# Patient Record
Sex: Male | Born: 1940
Health system: Southern US, Community
[De-identification: ages and names within clinical notes are randomized; demographics above are authoritative.]

## PROBLEM LIST (undated history)

## (undated) DIAGNOSIS — M1990 Unspecified osteoarthritis, unspecified site: Secondary | ICD-10-CM

## (undated) DIAGNOSIS — F32A Depression, unspecified: Secondary | ICD-10-CM

## (undated) DIAGNOSIS — R55 Syncope and collapse: Secondary | ICD-10-CM

## (undated) DIAGNOSIS — I480 Paroxysmal atrial fibrillation: Secondary | ICD-10-CM

## (undated) DIAGNOSIS — I779 Disorder of arteries and arterioles, unspecified: Secondary | ICD-10-CM

## (undated) DIAGNOSIS — I951 Orthostatic hypotension: Secondary | ICD-10-CM

## (undated) DIAGNOSIS — Z95810 Presence of automatic (implantable) cardiac defibrillator: Secondary | ICD-10-CM

## (undated) DIAGNOSIS — I2510 Atherosclerotic heart disease of native coronary artery without angina pectoris: Secondary | ICD-10-CM

## (undated) DIAGNOSIS — I5022 Chronic systolic (congestive) heart failure: Secondary | ICD-10-CM

## (undated) DIAGNOSIS — I10 Essential (primary) hypertension: Secondary | ICD-10-CM

## (undated) DIAGNOSIS — Z950 Presence of cardiac pacemaker: Secondary | ICD-10-CM

## (undated) DIAGNOSIS — R269 Unspecified abnormalities of gait and mobility: Secondary | ICD-10-CM

## (undated) DIAGNOSIS — F329 Major depressive disorder, single episode, unspecified: Secondary | ICD-10-CM

## (undated) DIAGNOSIS — N189 Chronic kidney disease, unspecified: Secondary | ICD-10-CM

## (undated) DIAGNOSIS — I219 Acute myocardial infarction, unspecified: Secondary | ICD-10-CM

## (undated) DIAGNOSIS — L859 Epidermal thickening, unspecified: Secondary | ICD-10-CM

## (undated) DIAGNOSIS — Z973 Presence of spectacles and contact lenses: Secondary | ICD-10-CM

## (undated) DIAGNOSIS — I255 Ischemic cardiomyopathy: Secondary | ICD-10-CM

## (undated) DIAGNOSIS — I739 Peripheral vascular disease, unspecified: Secondary | ICD-10-CM

## (undated) DIAGNOSIS — Z972 Presence of dental prosthetic device (complete) (partial): Secondary | ICD-10-CM

## (undated) DIAGNOSIS — J45909 Unspecified asthma, uncomplicated: Secondary | ICD-10-CM

## (undated) HISTORY — DX: Orthostatic hypotension: I95.1

## (undated) HISTORY — DX: Epidermal thickening, unspecified: L85.9

## (undated) HISTORY — DX: Acute myocardial infarction, unspecified: I21.9

## (undated) HISTORY — DX: Paroxysmal atrial fibrillation: I48.0

## (undated) HISTORY — DX: Chronic systolic (congestive) heart failure: I50.22

## (undated) HISTORY — DX: Disorder of arteries and arterioles, unspecified: I77.9

## (undated) HISTORY — DX: Peripheral vascular disease, unspecified: I73.9

## (undated) HISTORY — DX: Chronic kidney disease, unspecified: N18.9

## (undated) HISTORY — DX: Atherosclerotic heart disease of native coronary artery without angina pectoris: I25.10

## (undated) HISTORY — DX: Essential (primary) hypertension: I10

## (undated) HISTORY — PX: ELBOW SURGERY: SHX618

## (undated) HISTORY — DX: Presence of automatic (implantable) cardiac defibrillator: Z95.810

## (undated) HISTORY — PX: CARDIAC DEFIBRILLATOR PLACEMENT: SHX171

## (undated) HISTORY — DX: Ischemic cardiomyopathy: I25.5

## (undated) HISTORY — PX: CORONARY ARTERY BYPASS GRAFT: SHX141

## (undated) HISTORY — DX: Syncope and collapse: R55

## (undated) HISTORY — DX: Unspecified abnormalities of gait and mobility: R26.9

## (undated) HISTORY — PX: HERNIA REPAIR: SHX51

---
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
## 1985-11-03 HISTORY — PX: SINUS EXPLORATION: SHX5214

## 1997-11-03 DIAGNOSIS — I219 Acute myocardial infarction, unspecified: Secondary | ICD-10-CM

## 1997-11-03 HISTORY — DX: Acute myocardial infarction, unspecified: I21.9

## 2003-06-02 ENCOUNTER — Ambulatory Visit (HOSPITAL_COMMUNITY): Admission: RE | Admit: 2003-06-02 | Discharge: 2003-06-02 | Payer: Self-pay | Admitting: Cardiology

## 2003-06-02 ENCOUNTER — Encounter: Payer: Self-pay | Admitting: Cardiology

## 2004-10-30 ENCOUNTER — Ambulatory Visit (HOSPITAL_COMMUNITY): Admission: RE | Admit: 2004-10-30 | Discharge: 2004-10-30 | Payer: Self-pay | Admitting: Cardiology

## 2004-11-03 HISTORY — PX: ROTATOR CUFF REPAIR: SHX139

## 2004-11-13 ENCOUNTER — Encounter (INDEPENDENT_AMBULATORY_CARE_PROVIDER_SITE_OTHER): Payer: Self-pay | Admitting: Cardiology

## 2004-11-13 ENCOUNTER — Ambulatory Visit (HOSPITAL_COMMUNITY): Admission: RE | Admit: 2004-11-13 | Discharge: 2004-11-13 | Payer: Self-pay | Admitting: Cardiology

## 2005-03-06 ENCOUNTER — Ambulatory Visit: Payer: Self-pay | Admitting: Internal Medicine

## 2005-03-10 ENCOUNTER — Ambulatory Visit: Payer: Self-pay | Admitting: Internal Medicine

## 2005-03-11 ENCOUNTER — Ambulatory Visit: Payer: Self-pay | Admitting: Internal Medicine

## 2005-03-14 ENCOUNTER — Inpatient Hospital Stay (HOSPITAL_COMMUNITY): Admission: RE | Admit: 2005-03-14 | Discharge: 2005-03-15 | Payer: Self-pay | Admitting: Internal Medicine

## 2005-03-14 ENCOUNTER — Ambulatory Visit: Payer: Self-pay | Admitting: Cardiology

## 2005-03-21 ENCOUNTER — Ambulatory Visit: Payer: Self-pay | Admitting: Cardiology

## 2005-04-02 ENCOUNTER — Ambulatory Visit: Payer: Self-pay

## 2005-07-14 ENCOUNTER — Ambulatory Visit: Payer: Self-pay | Admitting: Internal Medicine

## 2005-08-12 ENCOUNTER — Ambulatory Visit: Payer: Self-pay

## 2005-10-20 ENCOUNTER — Ambulatory Visit: Payer: Self-pay

## 2006-02-10 ENCOUNTER — Ambulatory Visit: Payer: Self-pay | Admitting: Internal Medicine

## 2006-05-14 ENCOUNTER — Ambulatory Visit: Payer: Self-pay | Admitting: Internal Medicine

## 2006-08-06 ENCOUNTER — Ambulatory Visit: Payer: Self-pay | Admitting: Internal Medicine

## 2006-11-12 ENCOUNTER — Ambulatory Visit: Payer: Self-pay | Admitting: Internal Medicine

## 2007-02-11 ENCOUNTER — Ambulatory Visit: Payer: Self-pay | Admitting: Internal Medicine

## 2007-03-30 ENCOUNTER — Ambulatory Visit: Payer: Self-pay | Admitting: Internal Medicine

## 2007-07-15 ENCOUNTER — Ambulatory Visit: Payer: Self-pay | Admitting: Internal Medicine

## 2007-10-06 HISTORY — PX: COLONOSCOPY: SHX174

## 2007-10-14 ENCOUNTER — Ambulatory Visit: Payer: Self-pay | Admitting: Internal Medicine

## 2008-01-11 ENCOUNTER — Ambulatory Visit: Payer: Self-pay | Admitting: Internal Medicine

## 2008-01-11 LAB — CONVERTED CEMR LAB: Pro B Natriuretic peptide (BNP): 204 pg/mL — ABNORMAL HIGH (ref 0.0–100.0)

## 2008-04-13 ENCOUNTER — Ambulatory Visit: Payer: Self-pay | Admitting: Internal Medicine

## 2008-07-13 ENCOUNTER — Ambulatory Visit: Payer: Self-pay | Admitting: Internal Medicine

## 2008-10-13 ENCOUNTER — Ambulatory Visit: Payer: Self-pay | Admitting: Internal Medicine

## 2008-12-29 ENCOUNTER — Encounter: Payer: Self-pay | Admitting: Internal Medicine

## 2009-01-02 ENCOUNTER — Ambulatory Visit: Payer: Self-pay | Admitting: Internal Medicine

## 2009-07-05 ENCOUNTER — Ambulatory Visit: Payer: Self-pay | Admitting: Internal Medicine

## 2009-07-06 ENCOUNTER — Encounter: Payer: Self-pay | Admitting: Internal Medicine

## 2009-10-04 ENCOUNTER — Ambulatory Visit: Payer: Self-pay | Admitting: Internal Medicine

## 2009-10-10 ENCOUNTER — Encounter: Payer: Self-pay | Admitting: Internal Medicine

## 2009-12-31 DIAGNOSIS — I2589 Other forms of chronic ischemic heart disease: Secondary | ICD-10-CM | POA: Insufficient documentation

## 2009-12-31 DIAGNOSIS — Z95810 Presence of automatic (implantable) cardiac defibrillator: Secondary | ICD-10-CM | POA: Insufficient documentation

## 2009-12-31 DIAGNOSIS — R0602 Shortness of breath: Secondary | ICD-10-CM | POA: Insufficient documentation

## 2010-01-01 ENCOUNTER — Ambulatory Visit: Payer: Self-pay | Admitting: Internal Medicine

## 2010-01-01 DIAGNOSIS — I5022 Chronic systolic (congestive) heart failure: Secondary | ICD-10-CM

## 2010-01-01 HISTORY — DX: Chronic systolic (congestive) heart failure: I50.22

## 2010-03-15 ENCOUNTER — Telehealth (INDEPENDENT_AMBULATORY_CARE_PROVIDER_SITE_OTHER): Payer: Self-pay | Admitting: *Deleted

## 2010-03-19 ENCOUNTER — Telehealth (INDEPENDENT_AMBULATORY_CARE_PROVIDER_SITE_OTHER): Payer: Self-pay | Admitting: *Deleted

## 2010-04-04 ENCOUNTER — Ambulatory Visit: Payer: Self-pay | Admitting: Internal Medicine

## 2010-04-05 ENCOUNTER — Encounter: Payer: Self-pay | Admitting: Internal Medicine

## 2010-07-04 ENCOUNTER — Ambulatory Visit: Payer: Self-pay | Admitting: Internal Medicine

## 2010-10-10 ENCOUNTER — Ambulatory Visit: Payer: Self-pay | Admitting: Internal Medicine

## 2010-10-15 ENCOUNTER — Ambulatory Visit
Admission: RE | Admit: 2010-10-15 | Discharge: 2010-10-15 | Payer: Self-pay | Source: Home / Self Care | Attending: Cardiology | Admitting: Cardiology

## 2010-10-18 ENCOUNTER — Encounter: Payer: Self-pay | Admitting: Internal Medicine

## 2010-12-03 NOTE — Cardiovascular Report (Signed)
Summary: Office Visit Remote   Office Visit Remote   Imported By: Roderic Ovens 07/29/2010 16:11:06  _____________________________________________________________________  External Attachment:    Type:   Image     Comment:   External Document

## 2010-12-03 NOTE — Cardiovascular Report (Signed)
Summary: Office Visit   Office Visit   Imported By: Roderic Ovens 01/03/2010 14:00:52  _____________________________________________________________________  External Attachment:    Type:   Image     Comment:   External Document

## 2010-12-03 NOTE — Cardiovascular Report (Signed)
Summary: Office Visit Remote   Office Visit Remote   Imported By: Roderic Ovens 05/14/2010 13:35:34  _____________________________________________________________________  External Attachment:    Type:   Image     Comment:   External Document

## 2010-12-03 NOTE — Progress Notes (Signed)
  Phone Note Outgoing Call   Call placed by: Altha Harm, LPN,  Mar 19, 2010 9:58 AM Call placed to: Patient Summary of Call: Latitude transmission recieved.  RA and RV amplitude and impedance normal.  No episodes.  Left message for patient. Initial call taken by: Altha Harm, LPN,  Mar 19, 2010 10:00 AM
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
## 2010-12-03 NOTE — Assessment & Plan Note (Signed)
Summary: defib  check.[email or web address]   CC:  defib check.  History of Present Illness: Connor Wilkerson comes in follow-up for his ICD implanted in the setting of ischemic heart disease.  He is doing quite well without significant problems of chest pain shortness of breath or peripheral edema  Current Medications (verified): 1)  Omeprazole 20 Mg Cpdr (Omeprazole) .... Once Daily 2)  Carvedilol 25 Mg Tabs (Carvedilol) .... Take One Half Tablet Two Times A Day 3)  Atacand 32 Mg Tabs (Candesartan Cilexetil) .... Take One Tablet Once Daily 4)  Amlodipine Besylate 5 Mg Tabs (Amlodipine Besylate) .... Take One Tablet Once Daily 5)  Crestor 20 Mg Tabs (Rosuvastatin Calcium) .... Once Daily 6)  Plavix 75 Mg Tabs (Clopidogrel Bisulfate) .... Once Daily 7)  Isosorbide Mononitrate Cr 60 Mg Xr24h-Tab (Isosorbide Mononitrate) .... Take One Tablet in The Am and One Tablet in The Pm 8)  Quinapril Hcl 20 Mg Tabs (Quinapril Hcl) .... Take One Tablet Two Times A Day 9)  Lexapro 10 Mg Tabs (Escitalopram Oxalate) .... Take One Tablet Once Daily 10)  Aspirin 81 Mg Tabs (Aspirin) .... Take One Tablet Once Daily 11)  Ambien 10 Mg Tabs (Zolpidem Tartrate) .... As Needed 12)  Propoxyphene N-Apap 100-650 Mg Tabs (Propoxyphene N-Apap) .... As Needed At Bedtime  Allergies (verified): No Known Drug Allergies  Past History:  Past Medical History: Last updated: 12/31/2009 DYSPNEA (ICD-786.05) CARDIOMYOPATHY, ISCHEMIC (ICD-414.8) ICD - IN SITU (ICD-V45.02)    Past Surgical History: Last updated: 12/31/2009 CABG  Vital Signs:  Patient profile:   70 year old male Height:      70 inches Weight:      180 pounds BMI:     25.92 Pulse rate:   75 / minute Pulse rhythm:   regular BP sitting:   154 / 94  (left arm) Cuff size:   large  Vitals Entered By: Judithe Modest CMA (January 01, 2010 11:24 AM)  Physical Exam  General:  The patient was alert and oriented in no acute distress. HEENT Normal.  Neck veins were  flat, carotids were brisk.  Lungs were clear.  Heart sounds were regular without murmurs or gallops.  Abdomen was soft with active bowel sounds. There is no clubbing cyanosis or edema. Skin Warm and dry     ICD Specifications Following MD:  Sherryl Manges, MD     ICD Vendor:  [facility] [employer]     ICD Model Number:  [id number]     ICD Serial Number:  [id number] ICD DOI:  03/14/2005     ICD Implanting MD:  Sherryl Manges, MD  Lead 1:    Location: RA     DOI: 03/14/2005     Model #: 0981     Serial #: [id number]     Status: active Lead 2:    Location: RV     DOI: 03/14/2005     Model #: 2130     Serial #: [id number]     Status: active  Indications::  ICM  Explantation Comments: Latitude  ICD Follow Up Remote Check?  No Battery Voltage:  2.70 V     Charge Time:  8.4 seconds     Battery Est. Longevity:  MOL1 Underlying rhythm:  Brady @ 44 ICD Dependent:  No       ICD Device Measurements Atrium:  Amplitude: 2.4 mV, Impedance: 381 ohms, Threshold: 0.6 V at 0.4 msec Right Ventricle:  Amplitude: 7.4 mV, Impedance: 592 ohms, Threshold: 0.8 V at 0.5 msec  Shock Impedance: 40 ohms   Episodes MS Episodes:  0     Percent Mode Switch:  0     Coumadin:  No Shock:  0     ATP:  0     Nonsustained:  0     Atrial Pacing:  85%     Ventricular Pacing:  4%  Brady Parameters Mode DDDR     Lower Rate Limit:  60     Upper Rate Limit 120 PAV 280      Tachy Zones VF:  240     VT:  210     VT1:  180     Next Remote Date:  04/04/2010     Next Cardiology Appt Due:  01/02/2011 Tech Comments:  No parameter changes.  Device function normal.  Battery MOL1.  Latitude transmissions every 3 months.   ROV 1 year with Dr. Graciela Husbands. Altha Harm, LPN  January 02, 6044 11:50 AM   Impression & Recommendations:  Problem # 1:  CARDIOMYOPATHY, ISCHEMIC (ICD-414.8) stable. I've asked him to talk to Dr. Sharyn Lull but increase his carvedilol. His blood pressure is elevated today although he says is mostly white coat hypertension His updated  medication list for this problem includes:    Carvedilol 25 Mg Tabs (Carvedilol) .Marland Kitchen... Take one half tablet two times a day    Atacand 32 Mg Tabs (Candesartan cilexetil) .Marland Kitchen... Take one tablet once daily    Amlodipine Besylate 5 Mg Tabs (Amlodipine besylate) .Marland Kitchen... Take one tablet once daily    Plavix 75 Mg Tabs (Clopidogrel bisulfate) ..... Once daily    Isosorbide Mononitrate Cr 60 Mg Xr24h-tab (Isosorbide mononitrate) .Marland Kitchen... Take one tablet in the am and one tablet in the pm    Quinapril Hcl 20 Mg Tabs (Quinapril hcl) .Marland Kitchen... Take one tablet two times a day    Aspirin 81 Mg Tabs (Aspirin) .Marland Kitchen... Take one tablet once daily  Problem # 2:  ICD - IN SITU (ICD-V45.02) Device parameters and data were reviewed and no changes were made  Problem # 3:  HYPERTENSION, HEART CONTROLLED W/ CHF (ICD-402.11) blood pressure is elevated today at best and Dr. Dr. Sharyn Lull about increasing his medications. His updated medication list for this problem includes:    Carvedilol 25 Mg Tabs (Carvedilol) .Marland Kitchen... Take one half tablet two times a day    Atacand 32 Mg Tabs (Candesartan cilexetil) .Marland Kitchen... Take one tablet once daily    Amlodipine Besylate 5 Mg Tabs (Amlodipine besylate) .Marland Kitchen... Take one tablet once daily    Quinapril Hcl 20 Mg Tabs (Quinapril hcl) .Marland Kitchen... Take one tablet two times a day    Aspirin 81 Mg Tabs (Aspirin) .Marland Kitchen... Take one tablet once daily

## 2010-12-05 NOTE — Letter (Signed)
Summary: Remote Device Check  [facility] [employer], Main Office  [address]   [place], [place] [address]   Phone: [phone]  Fax: [phone]     October 18, 2010 MRN: [id number]   AVANEESH PEPITONE [address] [place], [place]  [address]   Dear Mr. WEIDE,   Your remote transmission was recieved and reviewed by your physician.  All diagnostics were within normal limits for you.    __X____Your next office visit is scheduled for:  April 2012 with Dr Graciela Husbands. Please call our office to schedule an appointment.    Sincerely,  Vella Kohler

## 2010-12-05 NOTE — Cardiovascular Report (Signed)
Summary: Office Visit Remote   Office Visit Remote   Imported By: Roderic Ovens 10/30/2010 11:50:15  _____________________________________________________________________  External Attachment:    Type:   Image     Comment:   External Document

## 2011-03-18 NOTE — Letter (Signed)
January 02, 2009    Eduardo Osier. Harwani, M.D.  [address]  [place], [place] [address]   RE:  KHADIM, LUNDBERG  MRN:  [id number]  /  DOB:  1940-12-30   Dear Dr. Sharyn Lull:   Mr. Kingry comes in follow-up for his ICD implanted in the setting of  ischemic heart disease.  He is doing quite well without significant  problems of chest pain.  He does have some shortness of breath if he  pushes too hard.  He also has problems with lightheadedness and  presyncope, especially in the morning with changes in position.  I  reviewed his morning behaviors.  He does not shower in the morning, he  does not drink anything hot, so I think it is probably just overnight  volume depletion.   MEDICATIONS:  1. Omeprazole 20.  2. Carvedilol 25 two times daily.  3. Atacand 32.  4. Amlodipine 2.5.  5. Crestor.  6. Plavix.  7. Isosorbide 60 four times daily.  8. Quinapril 20 two times daily.   EXAMINATION:  His blood pressure today was 112/76, his pulse was 60.  LUNGS:  Clear.  HEART SOUNDS:  Regular.  ABDOMEN:  Soft.  EXTREMITIES:  Without edema.   I suggested that he take a large glass of water in the morning when he  gets up.  I also would suggest to him that we stop his morning quinapril  and to take his amlodipine at night.  He is to call you and follow-up  with you the next little bit.  If there is anything further I could do  to help, please do not hesitate to contact me.    Sincerely,      Duke Salvia, MD, [facility]  Electronically Signed    SCK/MedQ  DD: 01/02/2009  DT: 01/02/2009  Job #: [id number]

## 2011-03-18 NOTE — Assessment & Plan Note (Signed)
[facility]                         ELECTROPHYSIOLOGY OFFICE NOTE   NAME:COOPERRegie, Connor                     MRN:          [id number]  DATE:03/30/2007                            DOB:          January 20, 1941    REASON FOR VISIT:  The patient comes in today; his major concern is his  hypertension.   HISTORY OF THE PRESENT ILLNESS:  The patient brings in recordings of  blood pressures ranging from 115-190 with most of them over 150.  He  states there have been multiple attempts to try to reduce his blood  pressure.  He has no complaints of chest pain or shortness of breath  currently.   MEDICATIONS:  The patient's medications currently include;  1. Coreg 25 mg every 12 hours.  2. Atacand 22 mg.  3. Norvasc 5 mg.  4. Imdur 3mg  twice a day.  5. Aspirin.  6. Plavix 75 mg.  7. Vytorin 10/80.  8. Nexium.   PHYSICAL EXAMINATION:  VITAL SIGNS:  On examination his blood pressure  is 190/84 and pulse is 83.  LUNGS:  The lungs are clear.  HEART:  The heart sounds are regular.  EXTREMITIES:  The extremities show __________  edema.   INVESTIGATIONS:  On interrogation of his Guidant Vitallium T-167 device  it demonstrated a P wave of 2 with an impedance  of 377, threshold of  0.4 and 0.5, R wave 12.4 with a impedance of 536, and a threshold of  0.06 and 0.5.  The lower rate limit was increased to 60.   IMPRESSION:  1. Severe and poorly controlled high blood pressure.  2. Ischemic cardiomyopathy.  3. Status post implantable cardioverter-defibrillator for primary      prevention.   I have taken the liberty today to increase this patient's medication by  adding hydrochlorothiazide in the form of his hydrochlorothiazide to his  Atacand in a 32/12.5 configuration.  I have asked him to follow up with  Dr. Sharyn Lull in the next couple weeks to look at his blood pressure.  He  asked if I had any thoughts and it thought that he may benefit from  renal input,  perhaps Dr. Nicoletta Ba who has a special in blood pressure  management to help care for this.   We will see him again in 12 months and will follow his device  intercurrently remotely.     Duke Salvia, MD, [facility]  Electronically Signed    SCK/MedQ  DD: 03/30/2007  DT: 03/31/2007  Job #: [id number]   cc:   Eduardo Osier. Sharyn Lull, M.D.

## 2011-03-18 NOTE — Assessment & Plan Note (Signed)
[facility]                         ELECTROPHYSIOLOGY OFFICE NOTE   NAME:Connor Wilkerson, Connor Wilkerson                     MRN:          [id number]  DATE:01/11/2008                            DOB:          01/24/1941    Mr. Westberg comes in following ICD implantation for primary prevention in  the setting of ischemic heart disease and prior bypass surgery.  His big  complaint has been dyspnea on exertion, noticeable mostly when he is  playing with his dog.  This has been going on for the last couple of  weeks and has been getting somewhat worse.  He has noted no accompanying  peripheral edema, orthopnea, or nocturnal dyspnea.   MEDICATIONS:  1. Nexium.  2. Vytorin 10/80.  3. Plavix 75.  4. Aspirin 81.  5. Coreg 25.  6. Atacand 32.  7. Norvasc 5.  8. Isosorbide mononitrate 30 b.i.d.  9. Crestor.  10.Quinapril 20 b.i.d.  11.Prilosec.   PHYSICAL EXAMINATION:  VITAL SIGNS:  His blood pressure was pretty well  controlled today at 140/86 with a pulse of 59.  LUNGS:  Clear.  HEART:  His heart sounds were regular.  EXTREMITIES:  Without edema.   Interrogation of his Guidant ICD demonstrates a P-wave of 3.1 with  impedance of 388, a threshold 0.4 at 0.5.  The R-wave was 7.6 with  impedance of 563 with threshold of 0.8 at 0.50.  High voltage impedance  was 39 ohms.  Battery voltage was 3.16.  The device was reprogrammed for  longevity.   IMPRESSION:  1. Ischemic cardiomyopathy with prior bypass.  2. Dyspnea on exertion - recently worsening.  3. Poorly-controlled blood pressure.  4. Status post ICD for primary prevention.   Mr. Cheuvront's rhythm issues are stable.  His dyspnea is somewhat  concerning.  He did not mention it to Dr. Sharyn Lull last week when he saw  him.  I will plan to check a BNP level today and will forward those  results to Dr. Sharyn Lull.   Will see him again in six months' time.     Duke Salvia, MD, [facility]  Electronically Signed    SCK/MedQ  DD: 01/11/2008  DT: 01/12/2008  Job #: [id number]   cc:   Eduardo Osier. Sharyn Lull, M.D.

## 2011-03-21 NOTE — Op Note (Signed)
NAME:  YECHESKEL, KUREK NO.:  [id number]   MEDICAL RECORD NO.:  [id number]          PATIENT TYPE:  INP   LOCATION:  2899                         FACILITY:  [facility]   PHYSICIAN:  Duke Salvia, M.D.  DATE OF BIRTH:  Jan 06, 1941   DATE OF PROCEDURE:  03/14/2005  DATE OF DISCHARGE:                                 OPERATIVE REPORT   PREOPERATIVE DIAGNOSIS:  Ischemic cardiomyopathy, depressed left ventricular  function.   POSTOPERATIVE DIAGNOSIS:  Ischemic cardiomyopathy, depressed left  ventricular function.   OPERATION PERFORMED:  Implantation of a dual chamber defibrillator with  intraoperative defibrillator with intraoperative defibrillation threshold  testing.   DESCRIPTION OF PROCEDURE:  Following the obtaining of informed consent, the  patient was brought to the electrophysiology laboratory and placed on the  fluoroscopic table in supine position.  After routine prep and drape of the  left upper chest, lidocaine was infiltrated in the prepectoral groove and  incision was made and carried down to the layer of the prepectoral fascia  using electrocautery and sharp dissection.  A pocket was formed similarly.  Hemostasis was obtained.   Thereafter, attention was turned to gaining access to the extrathoracic left  subclavian vein which was accomplished with modest difficulty, puncturing  the artery on two occasions.  Pressure was held.  Ultimately, venipuncture  was accomplished.  I used a double wire technique to hopefully avoid further  arterial puncture.  The guidewire was placed and retained.  Over the two  guidewires sequentially a 9 [place] and then a 7 [place] tear-away introducer  sheath were placed through which were passed sequentially a Guidant 0.158-  [id number] dual coil active fixation ventricular lead and a Medtronic 5076 52 cm  active fixation atrial lead serial number [id number].  Under fluoroscopic  these were manipulated to the right ventricular apex  and the right atrial  free wall respectively where the bipolar R-wave was 6.6 mV with a pacing  impedance of 664 with a threshold of 0.8 V at 0.5 msec.  Current at  threshold was 1.3 mA and there was no diaphragmatic pacing at 10 V.   The bipolar P-wave was 1.5 mV with a pacing impedance of 723 ohms and a  threshold of 1.5 V at 0.5 msec shortly after screw deployment.  Current at  threshold was 2.7 mA.   With these acceptable parameters recorded, the leads were secured to a  Guidant Vitality 2EL ICD model T167, serial number [id number].  Through the  device a bipolar R-wave was 7.4 mV with a pacing impedance of 718 ohms and  threshold of 0.6 V at 0.5 msec.  The P wave was 2.6 mV with a pacing  impedance of 571 ohms and threshold of 0.4 V at 0.5 msec and high voltage  impedance was 47 ohms.  With these acceptable parameters recorded,  defibrillation threshold testing was undertaken.  The leads and the pulse  generator were placed in the pocket.  The pocket had been copiously  irrigated with antibiotic containing saline solution and hemostasis was  assured.   Ventricular fibrillation was induced via the T-wave  shock.  After a total  duration of about 5 seconds, a 14 joule shock was delivered through a  measured resistance of 38 ohms failing to terminate ventricular  fibrillation.  After a total duration of 14 seconds, a 21 joule shock was  delivered through a measured resistance of 37 ohms terminating ventricular  fibrillation and restoring sinus rhythm.   After waiting 5 to 6 minutes, ventricular fibrillation was reinduced via T  wave shock.  After a total duration of 7 seconds a 21 joule shock was  delivered through a measured resistance of 34 ohms, terminating ventricular  fibrillation and restoring sinus rhythm.  With these acceptable parameters  recorded, the device was implanted.  The pocket was closed in three layers  in  normal fashion. The wound was washed and dried and benzoin and  Steri-  Strip dressing was applied.  Sponge, needle and instrument counts were  correct at the end of the procedure according to the staff.  The patient  tolerated the procedure without apparent complication.      SCK/MEDQ  D:  03/14/2005  T:  03/14/2005  Job:  [id number]   cc:   Eduardo Osier. Harwani, M.D.  [address]     [address]  [place]  [place] [address]  Fax: [phone]   Electrophys lab

## 2011-03-21 NOTE — Discharge Summary (Signed)
NAME:  Connor Wilkerson, Connor Wilkerson NO.:  [id number]   MEDICAL RECORD NO.:  [id number]          PATIENT TYPE:  INP   LOCATION:  6522                         FACILITY:  [facility]   PHYSICIAN:  Duke Salvia, M.D.  DATE OF BIRTH:  14-Oct-1941   DATE OF ADMISSION:  03/14/2005  DATE OF DISCHARGE:  03/15/2005                                 DISCHARGE SUMMARY   PROCEDURES:  Successful ICD/DDD implant.   DISCHARGE DIAGNOSES:  1.  Ischemic heart disease with an EF of 30%.  2.  Status post aortocoronary bypass graft.  3.  Recent Cardiolite with no ischemia.  4.  Possible atrial fibrillation.  5.  Hypertension.  6.  Gastroesophageal reflux disease symptoms, status post [employer]      fundoplication  7.  History of hiatal hernia.  8.  Sinus surgery.  9.  Hernia repair.  10. Left elbow surgery.  11. Rotator cuff surgery in April 2006.  12. Family history of coronary artery disease.  13. Tobacco use.  14. Inferior myocardial infarction  in 1999.   HOSPITAL COURSE:  Connor Wilkerson is a 70 year old male, patient of Dr. Sharyn Lull,  who was referred to Dr. Graciela Husbands for evaluation for an implantable cardioverter  device. Dr. Graciela Husbands evaluated and felt that he qualified;  he was admitted for  this on Mar 14, 2005.   Connor Wilkerson had successful implantation of a Guidant Vitality ICD DVD  generator. He tolerated the procedure well.   Postprocedure however, his heart rate decreased in the 40s (paced)  and his  blood pressure dropped to about 80. He had some shoulder pain and profuse  diaphoresis with pallor and lightheadedness. He had no chest pain.  It  started while he was lying supine. He was treated with atropine and IV  fluids. His wound site was without hematoma. An echocardiogram was performed  which showed no pericardial effusion. Chest x-ray was performed which showed  good lead placement and no hemothorax or pneumothorax. His symptoms  resolved.   On Mar 15, 2005 Connor Wilkerson's Troponin I was  minimally elevated at 0.24, but  this was felt secondary to the procedure. He had no further episodes of  presyncope or diaphoresis. He was ambulating without chest pain or shortness  of breath, and considered stable for discharge on Mar 15, 2005.   DISCHARGE INSTRUCTIONS:  His activity level is to be per the mobility sheet.  He is to stick to a low-sodium cholesterol diet. He is to keep the incision  dry for the next 7 days. He is to follow up with the ICD clinic on Monday,  Mar 31, 2005; and he is to see Dr. Graciela Husbands in August.   DISCHARGE MEDICATIONS:  1.  Coated aspirin 91 mg q.d.  2.  Plavix 75 mg q.d.  3.  Atacand 4 mg q.d.  4.  Nexium 40 mg q.d.  5.  Vytorin 10/80 mg q.d.  6.  Coreg 2.125 mg b.i.d.  7.  Norvasc 5 mg q.d.  8.  Imdur 30 mg q.d.      RB/MEDQ  D:  03/15/2005  T:  03/15/2005  Job:  [id number]   cc:   Eduardo Osier. Harwani, M.D.  [address]     [address]  [place]  [place] [address]  Fax: [phone]

## 2011-04-10 ENCOUNTER — Ambulatory Visit (INDEPENDENT_AMBULATORY_CARE_PROVIDER_SITE_OTHER): Payer: Medicare Other | Admitting: *Deleted

## 2011-04-10 ENCOUNTER — Other Ambulatory Visit: Payer: Self-pay

## 2011-04-10 DIAGNOSIS — I428 Other cardiomyopathies: Secondary | ICD-10-CM

## 2011-04-17 NOTE — Progress Notes (Signed)
icd remote check  

## 2011-07-18 ENCOUNTER — Encounter: Payer: Self-pay | Admitting: Internal Medicine

## 2011-08-06 ENCOUNTER — Encounter: Payer: Self-pay | Admitting: Internal Medicine

## 2011-08-07 ENCOUNTER — Ambulatory Visit (INDEPENDENT_AMBULATORY_CARE_PROVIDER_SITE_OTHER): Payer: Medicare Other | Admitting: Internal Medicine

## 2011-08-07 ENCOUNTER — Encounter: Payer: Self-pay | Admitting: Internal Medicine

## 2011-08-07 DIAGNOSIS — Z95810 Presence of automatic (implantable) cardiac defibrillator: Secondary | ICD-10-CM

## 2011-08-07 DIAGNOSIS — I2589 Other forms of chronic ischemic heart disease: Secondary | ICD-10-CM

## 2011-08-07 LAB — ICD DEVICE OBSERVATION
AL AMPLITUDE: 2.4 mv
AL IMPEDENCE ICD: 384 Ohm
AL THRESHOLD: 0.6 V
ATRIAL PACING ICD: 84 pct
BATTERY VOLTAGE: 2.59 V
DEV-0020ICD: NEGATIVE
DEVICE MODEL ICD: 105439
HV IMPEDENCE: 43 Ohm
RV LEAD AMPLITUDE: 6.6 mv
RV LEAD IMPEDENCE ICD: 641 Ohm
RV LEAD THRESHOLD: 0.8 V
TZAT-0001FASTVT: 1
TZAT-0001FASTVT: 2
TZAT-0001SLOWVT: 1
TZAT-0001SLOWVT: 2
TZAT-0013FASTVT: 1
TZAT-0013FASTVT: 2
TZAT-0013SLOWVT: 2
TZAT-0013SLOWVT: 2
TZAT-0018FASTVT: NEGATIVE
TZAT-0018FASTVT: NEGATIVE
TZAT-0018SLOWVT: NEGATIVE
TZAT-0018SLOWVT: NEGATIVE
TZON-0003FASTVT: 285.7 ms
TZON-0003SLOWVT: 333.3 ms
TZST-0001FASTVT: 3
TZST-0001FASTVT: 4
TZST-0001FASTVT: 5
TZST-0001FASTVT: 6
TZST-0001FASTVT: 7
TZST-0001SLOWVT: 3
TZST-0001SLOWVT: 4
TZST-0001SLOWVT: 5
TZST-0001SLOWVT: 6
TZST-0001SLOWVT: 7
TZST-0003FASTVT: 31 J
TZST-0003FASTVT: 31 J
TZST-0003FASTVT: 31 J
TZST-0003FASTVT: 31 J
TZST-0003FASTVT: 31 J
TZST-0003SLOWVT: 31 J
TZST-0003SLOWVT: 31 J
TZST-0003SLOWVT: 31 J
TZST-0003SLOWVT: 31 J
TZST-0003SLOWVT: 31 J
VENTRICULAR PACING ICD: 2 pct

## 2011-08-07 NOTE — Assessment & Plan Note (Signed)
The patient's device was interrogated.  The information was reviewed. No changes were made in the programming.    

## 2011-08-07 NOTE — Patient Instructions (Signed)
Your physician wants you to follow-up in: 1 year with Dr. Klein. You will receive a reminder letter in the mail two months in advance. If you don't receive a letter, please call our office to schedule the follow-up appointment.  Your physician recommends that you continue on your current medications as directed. Please refer to the Current Medication list given to you today.  

## 2011-08-07 NOTE — Progress Notes (Signed)
  HPI  Connor Wilkerson is a 70 y.o. male comes in follow-up for his ICD implanted in the setting of ischemic heart disease. He is doing quite well without significant  problems of chest pain shortness of breath or peripheral edema  He is seeing Dr. Sharyn Lull   Past Medical History  Diagnosis Date  . Dyspnea   . Ischemic cardiomyopathy     Past Surgical History  Procedure Date  . Cardiac defibrillator placement   . Coronary artery bypass graft   . Hernia repair     x2    Current Outpatient Prescriptions  Medication Sig Dispense Refill  . amLODipine (NORVASC) 5 MG tablet Take 5 mg by mouth daily.        Marland Kitchen aspirin 81 MG tablet Take 81 mg by mouth daily.        Marland Kitchen buPROPion (WELLBUTRIN XL) 300 MG 24 hr tablet Take 300 mg by mouth daily.        . candesartan (ATACAND) 32 MG tablet Take 32 mg by mouth daily.        . carvedilol (COREG) 25 MG tablet Take 25 mg by mouth daily.       . clopidogrel (PLAVIX) 75 MG tablet Take 75 mg by mouth daily.        Marland Kitchen escitalopram (LEXAPRO) 10 MG tablet Take 10 mg by mouth daily.       . famotidine (PEPCID) 20 MG tablet Take 20 mg by mouth 2 (two) times daily.        Marland Kitchen gabapentin (NEURONTIN) 300 MG capsule Take 300 mg by mouth every evening.        . isosorbide mononitrate (IMDUR) 60 MG 24 hr tablet Take 60 mg by mouth 2 (two) times daily.        Marland Kitchen omeprazole (PRILOSEC OTC) 20 MG tablet Take 20 mg by mouth daily.        . pantoprazole (PROTONIX) 40 MG tablet Take 40 mg by mouth daily.        . propoxyphene-acetaminophen (DARVOCET-N 100) 100-650 MG per tablet Take 1 tablet by mouth every 6 (six) hours as needed.        . quinapril (ACCUPRIL) 20 MG tablet Take 20 mg by mouth 2 (two) times daily.       . rosuvastatin (CRESTOR) 20 MG tablet Take 20 mg by mouth daily.        . valsartan (DIOVAN) 320 MG tablet Take 320 mg by mouth daily.        Marland Kitchen zolpidem (AMBIEN) 10 MG tablet Take 10 mg by mouth at bedtime as needed.          Allergies  Allergen  Reactions  . Tramadol     Review of Systems negative except from HPI and PMH  Physical Exam Well developed and well nourished in no acute distress HENT normal E scleral and icterus clear  neck supple  Clear to ausculation Regular rate and rhythm, no murmurs gallops or rub Soft with active bowel sounds No clubbing cyanosis and edema Alert and oriented, grossly normal motor and sensory function Skin Warm and Dry     Assessment and  Plan

## 2011-08-07 NOTE — Assessment & Plan Note (Signed)
Stable current meds

## 2011-10-01 ENCOUNTER — Other Ambulatory Visit: Payer: Self-pay | Admitting: Cardiology

## 2011-10-07 ENCOUNTER — Other Ambulatory Visit: Payer: Self-pay | Admitting: Cardiology

## 2011-10-14 ENCOUNTER — Other Ambulatory Visit: Payer: Self-pay | Admitting: Cardiology

## 2011-11-06 ENCOUNTER — Ambulatory Visit (INDEPENDENT_AMBULATORY_CARE_PROVIDER_SITE_OTHER): Payer: Medicare Other | Admitting: *Deleted

## 2011-11-06 ENCOUNTER — Other Ambulatory Visit: Payer: Self-pay | Admitting: Internal Medicine

## 2011-11-06 ENCOUNTER — Encounter: Payer: Self-pay | Admitting: Internal Medicine

## 2011-11-06 DIAGNOSIS — Z95810 Presence of automatic (implantable) cardiac defibrillator: Secondary | ICD-10-CM

## 2011-11-06 DIAGNOSIS — I428 Other cardiomyopathies: Secondary | ICD-10-CM

## 2011-11-08 LAB — REMOTE ICD DEVICE
AL AMPLITUDE: 2 mv
AL IMPEDENCE ICD: 377 Ohm
ATRIAL PACING ICD: 90 pct
CHARGE TIME: 8.4 s
DEV-0020ICD: NEGATIVE
DEVICE MODEL ICD: 105439
FVT: 0
MODE SWITCH EPISODES: 0
PACEART VT: 0
RV LEAD AMPLITUDE: 6.1 mv
TOT-0006: 20120906000000
TZAT-0001FASTVT: 2
TZAT-0001SLOWVT: 2
TZAT-0013FASTVT: 1
TZAT-0013FASTVT: 2
TZAT-0013SLOWVT: 2
TZAT-0013SLOWVT: 2
TZAT-0018FASTVT: NEGATIVE
TZAT-0018FASTVT: NEGATIVE
TZAT-0018SLOWVT: NEGATIVE
TZAT-0018SLOWVT: NEGATIVE
TZON-0003FASTVT: 285.7 ms
TZON-0003SLOWVT: 333.3 ms
TZST-0001FASTVT: 3
TZST-0001FASTVT: 4
TZST-0001FASTVT: 5
TZST-0001FASTVT: 6
TZST-0001FASTVT: 7
TZST-0001SLOWVT: 4
TZST-0001SLOWVT: 5
TZST-0001SLOWVT: 7
TZST-0003FASTVT: 31 J
TZST-0003FASTVT: 31 J
TZST-0003FASTVT: 31 J
TZST-0003FASTVT: 31 J
TZST-0003FASTVT: 31 J
TZST-0003SLOWVT: 31 J
TZST-0003SLOWVT: 31 J
TZST-0003SLOWVT: 31 J
TZST-0003SLOWVT: 31 J
VENTRICULAR PACING ICD: 15 pct

## 2011-11-10 DIAGNOSIS — D510 Vitamin B12 deficiency anemia due to intrinsic factor deficiency: Secondary | ICD-10-CM | POA: Diagnosis not present

## 2011-11-13 NOTE — Progress Notes (Signed)
Remote icd check  

## 2011-11-19 ENCOUNTER — Other Ambulatory Visit: Payer: Self-pay | Admitting: Cardiology

## 2011-12-01 DIAGNOSIS — J209 Acute bronchitis, unspecified: Secondary | ICD-10-CM | POA: Diagnosis not present

## 2011-12-01 DIAGNOSIS — J069 Acute upper respiratory infection, unspecified: Secondary | ICD-10-CM | POA: Diagnosis not present

## 2011-12-02 DIAGNOSIS — J209 Acute bronchitis, unspecified: Secondary | ICD-10-CM | POA: Diagnosis not present

## 2011-12-11 DIAGNOSIS — D510 Vitamin B12 deficiency anemia due to intrinsic factor deficiency: Secondary | ICD-10-CM | POA: Diagnosis not present

## 2012-01-08 DIAGNOSIS — D510 Vitamin B12 deficiency anemia due to intrinsic factor deficiency: Secondary | ICD-10-CM | POA: Diagnosis not present

## 2012-01-21 DIAGNOSIS — I10 Essential (primary) hypertension: Secondary | ICD-10-CM | POA: Diagnosis not present

## 2012-01-21 DIAGNOSIS — I428 Other cardiomyopathies: Secondary | ICD-10-CM | POA: Diagnosis not present

## 2012-01-21 DIAGNOSIS — R0789 Other chest pain: Secondary | ICD-10-CM | POA: Diagnosis not present

## 2012-01-21 DIAGNOSIS — I2510 Atherosclerotic heart disease of native coronary artery without angina pectoris: Secondary | ICD-10-CM | POA: Diagnosis not present

## 2012-01-21 DIAGNOSIS — R7309 Other abnormal glucose: Secondary | ICD-10-CM | POA: Diagnosis not present

## 2012-02-05 ENCOUNTER — Encounter: Payer: Self-pay | Admitting: Internal Medicine

## 2012-02-05 ENCOUNTER — Ambulatory Visit (INDEPENDENT_AMBULATORY_CARE_PROVIDER_SITE_OTHER): Payer: Medicare Other | Admitting: *Deleted

## 2012-02-05 DIAGNOSIS — I472 Ventricular tachycardia: Secondary | ICD-10-CM

## 2012-02-05 DIAGNOSIS — I428 Other cardiomyopathies: Secondary | ICD-10-CM

## 2012-02-09 LAB — REMOTE ICD DEVICE
AL AMPLITUDE: 1.6 mv
AL IMPEDENCE ICD: 363 Ohm
ATRIAL PACING ICD: 89 pct
BATTERY VOLTAGE: 2.58 V
DEV-0020ICD: NEGATIVE
DEVICE MODEL ICD: 105439
FVT: 0
HV IMPEDENCE: 39 Ohm
PACEART VT: 0
RV LEAD AMPLITUDE: 6.5 mv
RV LEAD IMPEDENCE ICD: 577 Ohm
TOT-0006: 20130103000000
TZAT-0001FASTVT: 1
TZAT-0001FASTVT: 2
TZAT-0001SLOWVT: 1
TZAT-0001SLOWVT: 2
TZAT-0013FASTVT: 2
TZAT-0013SLOWVT: 2
TZAT-0013SLOWVT: 2
TZAT-0018FASTVT: NEGATIVE
TZAT-0018FASTVT: NEGATIVE
TZAT-0018SLOWVT: NEGATIVE
TZAT-0018SLOWVT: NEGATIVE
TZST-0001FASTVT: 4
TZST-0001FASTVT: 5
TZST-0001FASTVT: 6
TZST-0001FASTVT: 7
TZST-0001SLOWVT: 3
TZST-0001SLOWVT: 4
TZST-0001SLOWVT: 5
TZST-0001SLOWVT: 6
TZST-0003FASTVT: 31 J
TZST-0003FASTVT: 31 J
TZST-0003FASTVT: 31 J
TZST-0003FASTVT: 31 J
TZST-0003FASTVT: 31 J
TZST-0003SLOWVT: 31 J
TZST-0003SLOWVT: 31 J
VENTRICULAR PACING ICD: 10 pct
VF: 0

## 2012-02-12 DIAGNOSIS — D510 Vitamin B12 deficiency anemia due to intrinsic factor deficiency: Secondary | ICD-10-CM | POA: Diagnosis not present

## 2012-02-19 NOTE — Progress Notes (Signed)
Remote icd check  

## 2012-03-15 DIAGNOSIS — D510 Vitamin B12 deficiency anemia due to intrinsic factor deficiency: Secondary | ICD-10-CM | POA: Diagnosis not present

## 2012-03-16 DIAGNOSIS — I10 Essential (primary) hypertension: Secondary | ICD-10-CM | POA: Diagnosis not present

## 2012-03-16 DIAGNOSIS — R209 Unspecified disturbances of skin sensation: Secondary | ICD-10-CM | POA: Diagnosis not present

## 2012-03-16 DIAGNOSIS — K219 Gastro-esophageal reflux disease without esophagitis: Secondary | ICD-10-CM | POA: Diagnosis not present

## 2012-03-16 DIAGNOSIS — I2510 Atherosclerotic heart disease of native coronary artery without angina pectoris: Secondary | ICD-10-CM | POA: Diagnosis not present

## 2012-03-16 DIAGNOSIS — G4700 Insomnia, unspecified: Secondary | ICD-10-CM | POA: Diagnosis not present

## 2012-03-16 DIAGNOSIS — E785 Hyperlipidemia, unspecified: Secondary | ICD-10-CM | POA: Diagnosis not present

## 2012-03-16 DIAGNOSIS — F329 Major depressive disorder, single episode, unspecified: Secondary | ICD-10-CM | POA: Diagnosis not present

## 2012-04-15 DIAGNOSIS — R7309 Other abnormal glucose: Secondary | ICD-10-CM | POA: Diagnosis not present

## 2012-04-15 DIAGNOSIS — I10 Essential (primary) hypertension: Secondary | ICD-10-CM | POA: Diagnosis not present

## 2012-04-15 DIAGNOSIS — D510 Vitamin B12 deficiency anemia due to intrinsic factor deficiency: Secondary | ICD-10-CM | POA: Diagnosis not present

## 2012-04-15 DIAGNOSIS — I2510 Atherosclerotic heart disease of native coronary artery without angina pectoris: Secondary | ICD-10-CM | POA: Diagnosis not present

## 2012-04-15 DIAGNOSIS — E7800 Pure hypercholesterolemia, unspecified: Secondary | ICD-10-CM | POA: Diagnosis not present

## 2012-04-30 DIAGNOSIS — I2510 Atherosclerotic heart disease of native coronary artery without angina pectoris: Secondary | ICD-10-CM | POA: Diagnosis not present

## 2012-04-30 DIAGNOSIS — R0789 Other chest pain: Secondary | ICD-10-CM | POA: Diagnosis not present

## 2012-04-30 DIAGNOSIS — I10 Essential (primary) hypertension: Secondary | ICD-10-CM | POA: Diagnosis not present

## 2012-04-30 DIAGNOSIS — I509 Heart failure, unspecified: Secondary | ICD-10-CM | POA: Diagnosis not present

## 2012-04-30 DIAGNOSIS — I428 Other cardiomyopathies: Secondary | ICD-10-CM | POA: Diagnosis not present

## 2012-05-13 ENCOUNTER — Encounter: Payer: Self-pay | Admitting: Internal Medicine

## 2012-05-13 ENCOUNTER — Ambulatory Visit (INDEPENDENT_AMBULATORY_CARE_PROVIDER_SITE_OTHER): Payer: Medicare Other | Admitting: *Deleted

## 2012-05-13 DIAGNOSIS — I2589 Other forms of chronic ischemic heart disease: Secondary | ICD-10-CM

## 2012-05-13 DIAGNOSIS — Z95810 Presence of automatic (implantable) cardiac defibrillator: Secondary | ICD-10-CM

## 2012-05-18 DIAGNOSIS — D510 Vitamin B12 deficiency anemia due to intrinsic factor deficiency: Secondary | ICD-10-CM | POA: Diagnosis not present

## 2012-05-21 LAB — REMOTE ICD DEVICE
AL IMPEDENCE ICD: 381 Ohm
ATRIAL PACING ICD: 65 pct
BATTERY VOLTAGE: 2.58 V
CHARGE TIME: 8.4 s
DEV-0020ICD: NEGATIVE
DEVICE MODEL ICD: 105439
FVT: 0
HV IMPEDENCE: 39 Ohm
MODE SWITCH EPISODES: 0
PACEART VT: 0
RV LEAD AMPLITUDE: 6.8 mv
RV LEAD IMPEDENCE ICD: 697 Ohm
TOT-0006: 20130404000000
TZAT-0001FASTVT: 1
TZAT-0001FASTVT: 2
TZAT-0001SLOWVT: 1
TZAT-0001SLOWVT: 2
TZAT-0013FASTVT: 1
TZAT-0013SLOWVT: 2
TZAT-0013SLOWVT: 2
TZAT-0018FASTVT: NEGATIVE
TZAT-0018FASTVT: NEGATIVE
TZON-0003FASTVT: 285.7 ms
TZON-0003SLOWVT: 333.3 ms
TZST-0001FASTVT: 3
TZST-0001FASTVT: 4
TZST-0001FASTVT: 5
TZST-0001FASTVT: 6
TZST-0001FASTVT: 7
TZST-0001SLOWVT: 3
TZST-0001SLOWVT: 4
TZST-0001SLOWVT: 5
TZST-0001SLOWVT: 6
TZST-0001SLOWVT: 7
TZST-0003FASTVT: 31 J
TZST-0003FASTVT: 31 J
TZST-0003FASTVT: 31 J
TZST-0003FASTVT: 31 J
TZST-0003SLOWVT: 31 J
TZST-0003SLOWVT: 31 J
TZST-0003SLOWVT: 31 J
TZST-0003SLOWVT: 31 J
VENTRICULAR PACING ICD: 3 pct

## 2012-06-03 DIAGNOSIS — K219 Gastro-esophageal reflux disease without esophagitis: Secondary | ICD-10-CM | POA: Diagnosis not present

## 2012-06-17 ENCOUNTER — Encounter: Payer: Self-pay | Admitting: *Deleted

## 2012-06-18 DIAGNOSIS — D510 Vitamin B12 deficiency anemia due to intrinsic factor deficiency: Secondary | ICD-10-CM | POA: Diagnosis not present

## 2012-07-14 DIAGNOSIS — L821 Other seborrheic keratosis: Secondary | ICD-10-CM | POA: Diagnosis not present

## 2012-07-14 DIAGNOSIS — L570 Actinic keratosis: Secondary | ICD-10-CM | POA: Diagnosis not present

## 2012-07-14 DIAGNOSIS — L578 Other skin changes due to chronic exposure to nonionizing radiation: Secondary | ICD-10-CM | POA: Diagnosis not present

## 2012-07-19 DIAGNOSIS — D510 Vitamin B12 deficiency anemia due to intrinsic factor deficiency: Secondary | ICD-10-CM | POA: Diagnosis not present

## 2012-07-30 DIAGNOSIS — E7800 Pure hypercholesterolemia, unspecified: Secondary | ICD-10-CM | POA: Diagnosis not present

## 2012-07-30 DIAGNOSIS — I5020 Unspecified systolic (congestive) heart failure: Secondary | ICD-10-CM | POA: Diagnosis not present

## 2012-07-30 DIAGNOSIS — I2510 Atherosclerotic heart disease of native coronary artery without angina pectoris: Secondary | ICD-10-CM | POA: Diagnosis not present

## 2012-07-30 DIAGNOSIS — I10 Essential (primary) hypertension: Secondary | ICD-10-CM | POA: Diagnosis not present

## 2012-08-04 ENCOUNTER — Encounter: Payer: Self-pay | Admitting: *Deleted

## 2012-08-10 ENCOUNTER — Encounter: Payer: Self-pay | Admitting: Internal Medicine

## 2012-08-10 ENCOUNTER — Ambulatory Visit (INDEPENDENT_AMBULATORY_CARE_PROVIDER_SITE_OTHER): Payer: Medicare Other | Admitting: Internal Medicine

## 2012-08-10 VITALS — BP 115/68 | HR 64 | Ht 70.0 in | Wt 170.1 lb

## 2012-08-10 DIAGNOSIS — Z95810 Presence of automatic (implantable) cardiac defibrillator: Secondary | ICD-10-CM | POA: Diagnosis not present

## 2012-08-10 DIAGNOSIS — I2589 Other forms of chronic ischemic heart disease: Secondary | ICD-10-CM | POA: Diagnosis not present

## 2012-08-10 LAB — ICD DEVICE OBSERVATION
AL AMPLITUDE: 2 mv
AL IMPEDENCE ICD: 392 Ohm
AL THRESHOLD: 0.6 V
BATTERY VOLTAGE: 2.58 V
DEV-0020ICD: NEGATIVE
DEVICE MODEL ICD: 105439
HV IMPEDENCE: 43 Ohm
RV LEAD AMPLITUDE: 6.5 mv
RV LEAD IMPEDENCE ICD: 592 Ohm
TZAT-0001FASTVT: 1
TZAT-0001FASTVT: 2
TZAT-0001SLOWVT: 2
TZAT-0013FASTVT: 1
TZAT-0013FASTVT: 2
TZAT-0013SLOWVT: 2
TZAT-0013SLOWVT: 2
TZAT-0018FASTVT: NEGATIVE
TZAT-0018FASTVT: NEGATIVE
TZAT-0018SLOWVT: NEGATIVE
TZAT-0018SLOWVT: NEGATIVE
TZON-0003FASTVT: 285.7 ms
TZON-0003SLOWVT: 333.3 ms
TZST-0001FASTVT: 3
TZST-0001FASTVT: 4
TZST-0001FASTVT: 5
TZST-0001FASTVT: 6
TZST-0001FASTVT: 7
TZST-0001SLOWVT: 3
TZST-0001SLOWVT: 4
TZST-0001SLOWVT: 5
TZST-0001SLOWVT: 6
TZST-0001SLOWVT: 7
TZST-0003FASTVT: 31 J
TZST-0003FASTVT: 31 J
TZST-0003FASTVT: 31 J
TZST-0003FASTVT: 31 J
TZST-0003FASTVT: 31 J
TZST-0003SLOWVT: 31 J
TZST-0003SLOWVT: 31 J
TZST-0003SLOWVT: 31 J
VENTRICULAR PACING ICD: 8 pct

## 2012-08-10 NOTE — Assessment & Plan Note (Signed)
Stab;e on Guideline directed medical therapy

## 2012-08-10 NOTE — Progress Notes (Signed)
Patient Care Team: Philemon Kingdom as PCP - General (Internal Medicine)   HPI  Connor Wilkerson is a 71 y.o. male  comes in follow-up for his ICD implanted in the setting of ischemic heart disease. He is doing quite well without significant problems of chest pain shortness of breath or peripheral edema   He has some discomfort in his device pocket. He also describes range of motion issues moving across his left chest.      Past Medical History  Diagnosis Date  . Dyspnea   . Ischemic cardiomyopathy     Past Surgical History  Procedure Date  . Cardiac defibrillator placement   . Coronary artery bypass graft   . Hernia repair     x2    Current Outpatient Prescriptions  Medication Sig Dispense Refill  . amLODipine (NORVASC) 5 MG tablet Take 5 mg by mouth daily.        Marland Kitchen aspirin 81 MG tablet Take 81 mg by mouth daily.        Marland Kitchen buPROPion (WELLBUTRIN XL) 300 MG 24 hr tablet Take 300 mg by mouth daily.        . candesartan (ATACAND) 32 MG tablet Take 32 mg by mouth daily.        . carvedilol (COREG) 25 MG tablet Take 25 mg by mouth daily.       . clopidogrel (PLAVIX) 75 MG tablet Take 75 mg by mouth daily.        Marland Kitchen escitalopram (LEXAPRO) 10 MG tablet Take 10 mg by mouth daily.       Marland Kitchen gabapentin (NEURONTIN) 300 MG capsule Take 300 mg by mouth every evening.        . isosorbide mononitrate (IMDUR) 60 MG 24 hr tablet Take 60 mg by mouth 2 (two) times daily.        . pantoprazole (PROTONIX) 40 MG tablet Take 40 mg by mouth daily.        . propoxyphene-acetaminophen (DARVOCET-N 100) 100-650 MG per tablet Take 1 tablet by mouth every 6 (six) hours as needed.        . quinapril (ACCUPRIL) 20 MG tablet Take 20 mg by mouth 2 (two) times daily.       . rosuvastatin (CRESTOR) 20 MG tablet Take 20 mg by mouth daily.        . valsartan (DIOVAN) 320 MG tablet Take 320 mg by mouth daily.        Marland Kitchen zolpidem (AMBIEN) 10 MG tablet Take 10 mg by mouth at bedtime as needed.          Allergies    Allergen Reactions  . Tramadol     Review of Systems negative except from HPI and PMH  Physical Exam BP 115/68  Pulse 64  Ht 5\' 10"  (1.778 m)  Wt 170 lb 1.9 oz (77.166 kg)  BMI 24.41 kg/m2 Well developed and well nourished in no acute distress HENT normal E scleral and icterus clear Neck Supple JVP flat; carotids brisk and full Clear to ausculation Device pocket well healed; without hematoma or erythema  Regular rate and rhythm, no murmurs gallops or rub Soft with active bowel sounds No clubbing cyanosis none 12 are of a stroke in [place] and gums are 2 she and the 12th of the echocardiogram demonstrates atrial pacing with occasional PACs intervals 20/10/40 in Nonspecific ST changes Edema Alert and oriented, grossly normal motor and sensory function Skin Warm and Dry    Assessment and  Plan

## 2012-08-10 NOTE — Patient Instructions (Addendum)
Remote monitoring is used to monitor your Pacemaker of ICD from home. This monitoring reduces the number of office visits required to check your device to one time per year. It allows [place] to keep an eye on the functioning of your device to ensure it is working properly. You are scheduled for a device check from home on November 18, 2012. You may send your transmission at any time that day. If you have a wireless device, the transmission will be sent automatically. After your physician reviews your transmission, you will receive a postcard with your next transmission date.  Your physician wants you to follow-up in: 1 year with Dr Graciela Husbands.  You will receive a reminder letter in the mail two months in advance. If you don't receive a letter, please call our office to schedule the follow-up appointment.

## 2012-08-10 NOTE — Assessment & Plan Note (Signed)
The patient's device was interrogated.  The information was reviewed. No changes were made in the programming.    

## 2012-08-18 DIAGNOSIS — D510 Vitamin B12 deficiency anemia due to intrinsic factor deficiency: Secondary | ICD-10-CM | POA: Diagnosis not present

## 2012-08-26 ENCOUNTER — Telehealth: Payer: Self-pay | Admitting: Internal Medicine

## 2012-08-26 NOTE — Telephone Encounter (Signed)
Pt wants to have defib replaced before the first of the year so it can be done prior to insurance going up

## 2012-08-26 NOTE — Telephone Encounter (Signed)
Spoke to patient he stated his insurance is going up he will have to pay twice to three times more and he wants to get ICD replaced before the first of year.Message sent to Dr.Klein's nurse.

## 2012-08-30 DIAGNOSIS — L570 Actinic keratosis: Secondary | ICD-10-CM | POA: Diagnosis not present

## 2012-08-30 DIAGNOSIS — L219 Seborrheic dermatitis, unspecified: Secondary | ICD-10-CM | POA: Diagnosis not present

## 2012-09-03 NOTE — Telephone Encounter (Signed)
Appt made to see Dr. Graciela Husbands

## 2012-09-09 DIAGNOSIS — L570 Actinic keratosis: Secondary | ICD-10-CM | POA: Diagnosis not present

## 2012-09-15 ENCOUNTER — Telehealth: Payer: Self-pay | Admitting: Internal Medicine

## 2012-09-15 NOTE — Telephone Encounter (Signed)
Pt has appt to see Dr. Graciela Husbands on 09/24/2012.

## 2012-09-15 NOTE — Telephone Encounter (Signed)
Pt's wife calling re re-coop time for device change, wife has to have knee replacement 12-17, needs to make arrangements

## 2012-09-16 DIAGNOSIS — G4700 Insomnia, unspecified: Secondary | ICD-10-CM | POA: Diagnosis not present

## 2012-09-16 DIAGNOSIS — F329 Major depressive disorder, single episode, unspecified: Secondary | ICD-10-CM | POA: Diagnosis not present

## 2012-09-16 DIAGNOSIS — I10 Essential (primary) hypertension: Secondary | ICD-10-CM | POA: Diagnosis not present

## 2012-09-16 DIAGNOSIS — I2510 Atherosclerotic heart disease of native coronary artery without angina pectoris: Secondary | ICD-10-CM | POA: Diagnosis not present

## 2012-09-20 DIAGNOSIS — D510 Vitamin B12 deficiency anemia due to intrinsic factor deficiency: Secondary | ICD-10-CM | POA: Diagnosis not present

## 2012-09-24 ENCOUNTER — Ambulatory Visit (INDEPENDENT_AMBULATORY_CARE_PROVIDER_SITE_OTHER): Payer: Medicare Other | Admitting: Internal Medicine

## 2012-09-24 ENCOUNTER — Encounter: Payer: Self-pay | Admitting: Internal Medicine

## 2012-09-24 VITALS — BP 160/90 | HR 72 | Ht 70.0 in | Wt 171.0 lb

## 2012-09-24 DIAGNOSIS — Z95810 Presence of automatic (implantable) cardiac defibrillator: Secondary | ICD-10-CM | POA: Diagnosis not present

## 2012-09-24 NOTE — Progress Notes (Signed)
Patient Care Team: Philemon Kingdom as PCP - General (Internal Medicine)   HPI  Connor Wilkerson is a 71 y.o. male  comes in follow-up for his ICD implanted in the setting of ischemic heart disease. He is doing quite well without significant problems of chest pain shortness of breath or peripheral edema   He has some discomfort in his device pocket. He also describes range of motion issues moving across his left chest.  He is here today to see whether he qualifies for device generator replacement prior the change of the calendar year   Past Medical History  Diagnosis Date  . Dyspnea   . Ischemic cardiomyopathy   . ICD (implantable cardiac defibrillator)  BSx     Past Surgical History  Procedure Date  . Cardiac defibrillator placement   . Coronary artery bypass graft   . Hernia repair     x2    Current Outpatient Prescriptions  Medication Sig Dispense Refill  . amLODipine (NORVASC) 5 MG tablet Take 5 mg by mouth daily.        Marland Kitchen aspirin 81 MG tablet 2 TABS PO QD      . buPROPion (WELLBUTRIN XL) 300 MG 24 hr tablet Take 300 mg by mouth daily.        . candesartan (ATACAND) 32 MG tablet Take 32 mg by mouth daily.        . carvedilol (COREG) 25 MG tablet Take 25 mg by mouth daily.       Marland Kitchen escitalopram (LEXAPRO) 10 MG tablet Take 10 mg by mouth daily.       . famotidine (PEPCID) 20 MG tablet Take 20 mg by mouth 2 (two) times daily.      Marland Kitchen gabapentin (NEURONTIN) 300 MG capsule Take 300 mg by mouth every evening.        . isosorbide mononitrate (IMDUR) 60 MG 24 hr tablet Take 60 mg by mouth 2 (two) times daily.        . pantoprazole (PROTONIX) 40 MG tablet Take 40 mg by mouth daily.        . quinapril (ACCUPRIL) 20 MG tablet Take 20 mg by mouth 2 (two) times daily.       . rosuvastatin (CRESTOR) 20 MG tablet Take 20 mg by mouth daily.        . valsartan (DIOVAN) 320 MG tablet Take 320 mg by mouth daily.        Marland Kitchen zolpidem (AMBIEN) 10 MG tablet Take 10 mg by mouth at bedtime as  needed.          Allergies  Allergen Reactions  . Tramadol     Review of Systems negative except from HPI and PMH  Physical Exam BP 160/90  Pulse 72  Ht 5\' 10"  (1.778 m)  Wt 171 lb (77.565 kg)  BMI 24.54 kg/m2      Assessment and  Plan

## 2012-09-24 NOTE — Assessment & Plan Note (Signed)
Device interrogation demonstrates that he is still at [place]. He is advised that we'll probably be a year plus before he is at [facility] prompting device generator replacement

## 2012-10-15 ENCOUNTER — Encounter: Payer: Self-pay | Admitting: Internal Medicine

## 2012-11-01 DIAGNOSIS — D510 Vitamin B12 deficiency anemia due to intrinsic factor deficiency: Secondary | ICD-10-CM | POA: Diagnosis not present

## 2012-11-08 DIAGNOSIS — I2589 Other forms of chronic ischemic heart disease: Secondary | ICD-10-CM | POA: Diagnosis not present

## 2012-11-08 DIAGNOSIS — E7800 Pure hypercholesterolemia, unspecified: Secondary | ICD-10-CM | POA: Diagnosis not present

## 2012-11-08 DIAGNOSIS — I5020 Unspecified systolic (congestive) heart failure: Secondary | ICD-10-CM | POA: Diagnosis not present

## 2012-11-08 DIAGNOSIS — I10 Essential (primary) hypertension: Secondary | ICD-10-CM | POA: Diagnosis not present

## 2012-11-08 DIAGNOSIS — I2510 Atherosclerotic heart disease of native coronary artery without angina pectoris: Secondary | ICD-10-CM | POA: Diagnosis not present

## 2012-11-11 ENCOUNTER — Ambulatory Visit (INDEPENDENT_AMBULATORY_CARE_PROVIDER_SITE_OTHER): Payer: Medicare Other | Admitting: *Deleted

## 2012-11-11 DIAGNOSIS — Z95810 Presence of automatic (implantable) cardiac defibrillator: Secondary | ICD-10-CM | POA: Diagnosis not present

## 2012-11-11 DIAGNOSIS — I2589 Other forms of chronic ischemic heart disease: Secondary | ICD-10-CM

## 2012-11-15 LAB — REMOTE ICD DEVICE
BATTERY VOLTAGE: 2.57 V
DEV-0020ICD: NEGATIVE
DEVICE MODEL ICD: 105439
HV IMPEDENCE: 9.7 Ohm
MODE SWITCH EPISODES: 0
PACEART VT: 0
TZAT-0001FASTVT: 1
TZAT-0001SLOWVT: 1
TZAT-0013FASTVT: 1
TZAT-0013SLOWVT: 2
TZAT-0013SLOWVT: 2
TZAT-0018FASTVT: NEGATIVE
TZAT-0018SLOWVT: NEGATIVE
TZON-0003FASTVT: 285.7 ms
TZON-0003SLOWVT: 333.3 ms
TZST-0001FASTVT: 5
TZST-0001FASTVT: 6
TZST-0001SLOWVT: 3
TZST-0001SLOWVT: 4
TZST-0001SLOWVT: 5
TZST-0001SLOWVT: 6
TZST-0001SLOWVT: 7
TZST-0003FASTVT: 31 J
TZST-0003FASTVT: 31 J
TZST-0003FASTVT: 31 J
TZST-0003SLOWVT: 31 J
TZST-0003SLOWVT: 31 J
TZST-0003SLOWVT: 31 J
VF: 0

## 2012-11-29 ENCOUNTER — Encounter: Payer: Self-pay | Admitting: *Deleted

## 2012-12-02 ENCOUNTER — Telehealth: Payer: Self-pay | Admitting: Internal Medicine

## 2012-12-02 NOTE — Telephone Encounter (Signed)
Spoke w/wife---explained to wife to continue weekly checks but next billable check with report is in April. Pt's wife understands.

## 2012-12-02 NOTE — Telephone Encounter (Signed)
[phone] pt's wife calling re letter re next remote not due until April, and wife thought he was to do it every week, pls call

## 2012-12-03 ENCOUNTER — Encounter: Payer: Self-pay | Admitting: Internal Medicine

## 2012-12-03 DIAGNOSIS — D510 Vitamin B12 deficiency anemia due to intrinsic factor deficiency: Secondary | ICD-10-CM | POA: Diagnosis not present

## 2012-12-31 DIAGNOSIS — D510 Vitamin B12 deficiency anemia due to intrinsic factor deficiency: Secondary | ICD-10-CM | POA: Diagnosis not present

## 2013-01-28 DIAGNOSIS — D510 Vitamin B12 deficiency anemia due to intrinsic factor deficiency: Secondary | ICD-10-CM | POA: Diagnosis not present

## 2013-02-01 ENCOUNTER — Telehealth: Payer: Self-pay | Admitting: Internal Medicine

## 2013-02-01 NOTE — Telephone Encounter (Signed)
I will print this message and give to Dr. Graciela Husbands. Closing encounter.

## 2013-02-01 NOTE — Telephone Encounter (Signed)
° °  Pts wide would like to let Dr. Graciela Husbands know the [place] will be playing 4/17 4/18 4/19. [place]. States its easy to get good tickets.

## 2013-02-07 DIAGNOSIS — E7800 Pure hypercholesterolemia, unspecified: Secondary | ICD-10-CM | POA: Diagnosis not present

## 2013-02-07 DIAGNOSIS — I2510 Atherosclerotic heart disease of native coronary artery without angina pectoris: Secondary | ICD-10-CM | POA: Diagnosis not present

## 2013-02-07 DIAGNOSIS — I509 Heart failure, unspecified: Secondary | ICD-10-CM | POA: Diagnosis not present

## 2013-02-07 DIAGNOSIS — I10 Essential (primary) hypertension: Secondary | ICD-10-CM | POA: Diagnosis not present

## 2013-02-07 DIAGNOSIS — R7309 Other abnormal glucose: Secondary | ICD-10-CM | POA: Diagnosis not present

## 2013-02-09 DIAGNOSIS — L02828 Furuncle of other sites: Secondary | ICD-10-CM | POA: Diagnosis not present

## 2013-02-09 DIAGNOSIS — R1011 Right upper quadrant pain: Secondary | ICD-10-CM | POA: Diagnosis not present

## 2013-02-09 DIAGNOSIS — R197 Diarrhea, unspecified: Secondary | ICD-10-CM | POA: Diagnosis not present

## 2013-02-10 ENCOUNTER — Ambulatory Visit (INDEPENDENT_AMBULATORY_CARE_PROVIDER_SITE_OTHER): Payer: Medicare Other | Admitting: *Deleted

## 2013-02-10 ENCOUNTER — Other Ambulatory Visit: Payer: Self-pay

## 2013-02-10 DIAGNOSIS — Z95810 Presence of automatic (implantable) cardiac defibrillator: Secondary | ICD-10-CM

## 2013-02-10 DIAGNOSIS — I2589 Other forms of chronic ischemic heart disease: Secondary | ICD-10-CM | POA: Diagnosis not present

## 2013-02-14 DIAGNOSIS — R197 Diarrhea, unspecified: Secondary | ICD-10-CM | POA: Diagnosis not present

## 2013-02-17 DIAGNOSIS — R1011 Right upper quadrant pain: Secondary | ICD-10-CM | POA: Diagnosis not present

## 2013-02-17 LAB — REMOTE ICD DEVICE
AL AMPLITUDE: 1.6 mv
AL IMPEDENCE ICD: 381 Ohm
BATTERY VOLTAGE: 2.56 V
HV IMPEDENCE: 43 Ohm
TZAT-0001FASTVT: 1
TZAT-0001SLOWVT: 1
TZAT-0013FASTVT: 1
TZAT-0013SLOWVT: 2
TZAT-0018FASTVT: NEGATIVE
TZAT-0018SLOWVT: NEGATIVE
TZON-0003FASTVT: 285.7 ms
TZST-0001FASTVT: 3
TZST-0001FASTVT: 5
TZST-0001FASTVT: 6
TZST-0001SLOWVT: 3
TZST-0001SLOWVT: 4
TZST-0001SLOWVT: 7
TZST-0003FASTVT: 31 J
TZST-0003FASTVT: 31 J
TZST-0003SLOWVT: 31 J
TZST-0003SLOWVT: 31 J
TZST-0003SLOWVT: 31 J

## 2013-02-23 DIAGNOSIS — IMO0002 Reserved for concepts with insufficient information to code with codable children: Secondary | ICD-10-CM | POA: Diagnosis not present

## 2013-03-16 DIAGNOSIS — R197 Diarrhea, unspecified: Secondary | ICD-10-CM | POA: Diagnosis not present

## 2013-03-16 DIAGNOSIS — R634 Abnormal weight loss: Secondary | ICD-10-CM | POA: Diagnosis not present

## 2013-03-16 DIAGNOSIS — L02828 Furuncle of other sites: Secondary | ICD-10-CM | POA: Diagnosis not present

## 2013-03-29 DIAGNOSIS — D539 Nutritional anemia, unspecified: Secondary | ICD-10-CM | POA: Diagnosis not present

## 2013-04-01 DIAGNOSIS — J0100 Acute maxillary sinusitis, unspecified: Secondary | ICD-10-CM | POA: Diagnosis not present

## 2013-04-01 DIAGNOSIS — J209 Acute bronchitis, unspecified: Secondary | ICD-10-CM | POA: Diagnosis not present

## 2013-04-06 ENCOUNTER — Encounter: Payer: Self-pay | Admitting: Internal Medicine

## 2013-04-12 DIAGNOSIS — M19019 Primary osteoarthritis, unspecified shoulder: Secondary | ICD-10-CM | POA: Diagnosis not present

## 2013-04-12 DIAGNOSIS — M25519 Pain in unspecified shoulder: Secondary | ICD-10-CM | POA: Diagnosis not present

## 2013-04-14 DIAGNOSIS — D649 Anemia, unspecified: Secondary | ICD-10-CM | POA: Diagnosis not present

## 2013-04-26 ENCOUNTER — Telehealth: Payer: Self-pay | Admitting: Internal Medicine

## 2013-04-26 NOTE — Telephone Encounter (Signed)
New Problem  Pt's wife said that he keeps feeling this stinging sensation around his device area.  She said they spoke with a doctor on call last night and was advised to call the office this morning.

## 2013-04-26 NOTE — Telephone Encounter (Signed)
Spoke with pt wife, since last night the pt has c/o stinging type pain around the pocket and uncomfortable feeling under the pocket of his device. She reports he did not rest well last night. There is no redness or swelling at the site. Will discuss with the device nurses about pt coming into the office today.

## 2013-04-26 NOTE — Telephone Encounter (Signed)
Spoke with device nurse, pt to come to the office tomorrow morning at 9 am for someone to look at his device. Pt wife made aware.

## 2013-04-26 NOTE — Telephone Encounter (Signed)
Follow Up ° ° °Returning call from earlier. Please call back. °

## 2013-04-26 NOTE — Telephone Encounter (Signed)
Left message for pt wife to call  

## 2013-04-27 ENCOUNTER — Ambulatory Visit (INDEPENDENT_AMBULATORY_CARE_PROVIDER_SITE_OTHER): Payer: Medicare Other | Admitting: *Deleted

## 2013-04-27 DIAGNOSIS — I428 Other cardiomyopathies: Secondary | ICD-10-CM | POA: Diagnosis not present

## 2013-04-27 DIAGNOSIS — R7309 Other abnormal glucose: Secondary | ICD-10-CM | POA: Diagnosis not present

## 2013-04-27 DIAGNOSIS — I2510 Atherosclerotic heart disease of native coronary artery without angina pectoris: Secondary | ICD-10-CM | POA: Diagnosis not present

## 2013-04-27 DIAGNOSIS — I209 Angina pectoris, unspecified: Secondary | ICD-10-CM | POA: Diagnosis not present

## 2013-04-27 DIAGNOSIS — Z95810 Presence of automatic (implantable) cardiac defibrillator: Secondary | ICD-10-CM | POA: Diagnosis not present

## 2013-04-27 DIAGNOSIS — I2589 Other forms of chronic ischemic heart disease: Secondary | ICD-10-CM

## 2013-04-27 DIAGNOSIS — R0789 Other chest pain: Secondary | ICD-10-CM | POA: Diagnosis not present

## 2013-04-27 DIAGNOSIS — I10 Essential (primary) hypertension: Secondary | ICD-10-CM | POA: Diagnosis not present

## 2013-04-27 DIAGNOSIS — N189 Chronic kidney disease, unspecified: Secondary | ICD-10-CM | POA: Diagnosis not present

## 2013-04-27 LAB — ICD DEVICE OBSERVATION
AL IMPEDENCE ICD: 381 Ohm
AL THRESHOLD: 0.6 V
CHARGE TIME: 11.3 s
DEV-0020ICD: NEGATIVE
RV LEAD IMPEDENCE ICD: 600 Ohm
VENTRICULAR PACING ICD: 3 pct

## 2013-04-27 NOTE — Progress Notes (Signed)
Pt in today c/o "stinging" pain at device site next to header.  Pain has been present for approx. 1 yr.  Pt unintentionally lost 20lbs in past 3 months, c/o loss of appetite and experiencing some chills.  Plan to f/u w/ Dr. Graciela Husbands 06/13/13 regarding CBC results.

## 2013-05-12 ENCOUNTER — Encounter: Payer: Self-pay | Admitting: Internal Medicine

## 2013-05-12 ENCOUNTER — Ambulatory Visit (INDEPENDENT_AMBULATORY_CARE_PROVIDER_SITE_OTHER): Payer: Medicare Other | Admitting: *Deleted

## 2013-05-12 DIAGNOSIS — I2589 Other forms of chronic ischemic heart disease: Secondary | ICD-10-CM

## 2013-05-12 DIAGNOSIS — Z95810 Presence of automatic (implantable) cardiac defibrillator: Secondary | ICD-10-CM | POA: Diagnosis not present

## 2013-05-14 LAB — REMOTE ICD DEVICE
DEVICE MODEL ICD: 105439
FVT: 0
HV IMPEDENCE: 41 Ohm
RV LEAD AMPLITUDE: 5.8 mv
RV LEAD IMPEDENCE ICD: 650 Ohm
TZAT-0001FASTVT: 1
TZAT-0013FASTVT: 1
TZAT-0013SLOWVT: 2
TZAT-0018FASTVT: NEGATIVE
TZAT-0018SLOWVT: NEGATIVE
TZAT-0018SLOWVT: NEGATIVE
TZST-0001FASTVT: 3
TZST-0001SLOWVT: 3
TZST-0001SLOWVT: 4
TZST-0001SLOWVT: 7
TZST-0003FASTVT: 31 J
TZST-0003FASTVT: 31 J
TZST-0003SLOWVT: 31 J
TZST-0003SLOWVT: 31 J
VENTRICULAR PACING ICD: 2 pct
VF: 0

## 2013-05-17 ENCOUNTER — Telehealth: Payer: Self-pay | Admitting: Internal Medicine

## 2013-05-18 ENCOUNTER — Encounter: Payer: Self-pay | Admitting: Internal Medicine

## 2013-05-18 DIAGNOSIS — E538 Deficiency of other specified B group vitamins: Secondary | ICD-10-CM | POA: Diagnosis not present

## 2013-05-18 DIAGNOSIS — D649 Anemia, unspecified: Secondary | ICD-10-CM | POA: Diagnosis not present

## 2013-05-18 DIAGNOSIS — R634 Abnormal weight loss: Secondary | ICD-10-CM | POA: Diagnosis not present

## 2013-05-18 DIAGNOSIS — Z125 Encounter for screening for malignant neoplasm of prostate: Secondary | ICD-10-CM | POA: Diagnosis not present

## 2013-05-18 DIAGNOSIS — R7989 Other specified abnormal findings of blood chemistry: Secondary | ICD-10-CM | POA: Diagnosis not present

## 2013-05-18 DIAGNOSIS — I10 Essential (primary) hypertension: Secondary | ICD-10-CM | POA: Diagnosis not present

## 2013-05-18 DIAGNOSIS — I2510 Atherosclerotic heart disease of native coronary artery without angina pectoris: Secondary | ICD-10-CM | POA: Diagnosis not present

## 2013-05-19 DIAGNOSIS — Z950 Presence of cardiac pacemaker: Secondary | ICD-10-CM | POA: Diagnosis not present

## 2013-05-19 DIAGNOSIS — I10 Essential (primary) hypertension: Secondary | ICD-10-CM | POA: Diagnosis not present

## 2013-05-19 DIAGNOSIS — K2270 Barrett's esophagus without dysplasia: Secondary | ICD-10-CM | POA: Diagnosis not present

## 2013-05-19 DIAGNOSIS — K219 Gastro-esophageal reflux disease without esophagitis: Secondary | ICD-10-CM | POA: Diagnosis not present

## 2013-05-19 DIAGNOSIS — R634 Abnormal weight loss: Secondary | ICD-10-CM | POA: Diagnosis not present

## 2013-05-19 DIAGNOSIS — I2510 Atherosclerotic heart disease of native coronary artery without angina pectoris: Secondary | ICD-10-CM | POA: Diagnosis not present

## 2013-05-19 DIAGNOSIS — D649 Anemia, unspecified: Secondary | ICD-10-CM | POA: Diagnosis not present

## 2013-05-20 ENCOUNTER — Encounter: Payer: Self-pay | Admitting: Internal Medicine

## 2013-05-25 NOTE — Telephone Encounter (Signed)
phone

## 2013-05-26 ENCOUNTER — Encounter: Payer: Self-pay | Admitting: *Deleted

## 2013-05-31 DIAGNOSIS — K648 Other hemorrhoids: Secondary | ICD-10-CM | POA: Diagnosis not present

## 2013-05-31 DIAGNOSIS — K5730 Diverticulosis of large intestine without perforation or abscess without bleeding: Secondary | ICD-10-CM | POA: Diagnosis not present

## 2013-05-31 DIAGNOSIS — R634 Abnormal weight loss: Secondary | ICD-10-CM | POA: Diagnosis not present

## 2013-05-31 DIAGNOSIS — Z87891 Personal history of nicotine dependence: Secondary | ICD-10-CM | POA: Diagnosis not present

## 2013-05-31 DIAGNOSIS — Z86010 Personal history of colonic polyps: Secondary | ICD-10-CM | POA: Diagnosis not present

## 2013-05-31 DIAGNOSIS — Z7902 Long term (current) use of antithrombotics/antiplatelets: Secondary | ICD-10-CM | POA: Diagnosis not present

## 2013-05-31 DIAGNOSIS — K219 Gastro-esophageal reflux disease without esophagitis: Secondary | ICD-10-CM | POA: Diagnosis not present

## 2013-05-31 DIAGNOSIS — D649 Anemia, unspecified: Secondary | ICD-10-CM | POA: Diagnosis not present

## 2013-05-31 DIAGNOSIS — K2990 Gastroduodenitis, unspecified, without bleeding: Secondary | ICD-10-CM | POA: Diagnosis not present

## 2013-05-31 DIAGNOSIS — K2971 Gastritis, unspecified, with bleeding: Secondary | ICD-10-CM | POA: Diagnosis not present

## 2013-05-31 DIAGNOSIS — R630 Anorexia: Secondary | ICD-10-CM | POA: Diagnosis not present

## 2013-05-31 DIAGNOSIS — I519 Heart disease, unspecified: Secondary | ICD-10-CM | POA: Diagnosis not present

## 2013-05-31 DIAGNOSIS — K2270 Barrett's esophagus without dysplasia: Secondary | ICD-10-CM | POA: Diagnosis not present

## 2013-05-31 DIAGNOSIS — D126 Benign neoplasm of colon, unspecified: Secondary | ICD-10-CM | POA: Diagnosis not present

## 2013-05-31 DIAGNOSIS — Z79899 Other long term (current) drug therapy: Secondary | ICD-10-CM | POA: Diagnosis not present

## 2013-05-31 DIAGNOSIS — Z95810 Presence of automatic (implantable) cardiac defibrillator: Secondary | ICD-10-CM | POA: Diagnosis not present

## 2013-05-31 DIAGNOSIS — K2970 Gastritis, unspecified, without bleeding: Secondary | ICD-10-CM | POA: Diagnosis not present

## 2013-06-02 DIAGNOSIS — M1990 Unspecified osteoarthritis, unspecified site: Secondary | ICD-10-CM | POA: Diagnosis not present

## 2013-06-02 DIAGNOSIS — M25519 Pain in unspecified shoulder: Secondary | ICD-10-CM | POA: Diagnosis not present

## 2013-06-02 DIAGNOSIS — R209 Unspecified disturbances of skin sensation: Secondary | ICD-10-CM | POA: Diagnosis not present

## 2013-06-08 ENCOUNTER — Telehealth: Payer: Self-pay | Admitting: *Deleted

## 2013-06-08 NOTE — Telephone Encounter (Signed)
Connor Wilkerson is sending Blood work results.

## 2013-06-08 NOTE — Telephone Encounter (Signed)
Need CBC results for pt's appt monday. will call back.

## 2013-06-13 ENCOUNTER — Encounter: Payer: Self-pay | Admitting: Internal Medicine

## 2013-06-13 ENCOUNTER — Ambulatory Visit (INDEPENDENT_AMBULATORY_CARE_PROVIDER_SITE_OTHER): Payer: Medicare Other | Admitting: Internal Medicine

## 2013-06-13 VITALS — BP 162/88 | HR 70 | Ht 70.0 in | Wt 157.6 lb

## 2013-06-13 DIAGNOSIS — Z95810 Presence of automatic (implantable) cardiac defibrillator: Secondary | ICD-10-CM

## 2013-06-13 DIAGNOSIS — I2589 Other forms of chronic ischemic heart disease: Secondary | ICD-10-CM | POA: Diagnosis not present

## 2013-06-13 LAB — ICD DEVICE OBSERVATION
AL AMPLITUDE: 2.8 mv
ATRIAL PACING ICD: 65 pct
DEV-0020ICD: NEGATIVE
DEVICE MODEL ICD: 105439
HV IMPEDENCE: 43 Ohm
RV LEAD THRESHOLD: 1 V
TZAT-0001SLOWVT: 2
TZAT-0013FASTVT: 2
TZAT-0013SLOWVT: 2
TZAT-0018FASTVT: NEGATIVE
TZAT-0018SLOWVT: NEGATIVE
TZST-0001FASTVT: 3
TZST-0001FASTVT: 4
TZST-0001FASTVT: 7
TZST-0001SLOWVT: 4
TZST-0001SLOWVT: 7
TZST-0003FASTVT: 31 J
TZST-0003FASTVT: 31 J
TZST-0003FASTVT: 31 J
TZST-0003SLOWVT: 31 J
TZST-0003SLOWVT: 31 J
TZST-0003SLOWVT: 31 J
TZST-0003SLOWVT: 31 J
VENTRICULAR PACING ICD: 3 pct

## 2013-06-13 MED ORDER — CARVEDILOL 25 MG PO TABS: 25.0000 mg | ORAL_TABLET | Freq: Two times a day (BID) | ORAL | Status: DC

## 2013-06-13 NOTE — Assessment & Plan Note (Signed)
Blood pressure is elevated. He is taking his carvedilol 1 times a day;will have him increase it to twice daily.

## 2013-06-13 NOTE — Assessment & Plan Note (Signed)
Approaching ERI   The patient's device was interrogated.  The information was reviewed. No changes were made in the programming.

## 2013-06-13 NOTE — Patient Instructions (Addendum)
Your physician recommends that you schedule a follow-up appointment in: ONE YEAR  Remote monitoring is used to monitor your Pacemaker of ICD from home. This monitoring reduces the number of office visits required to check your device to one time per year. It allows [place] to keep an eye on the functioning of your device to ensure it is working properly. You are scheduled for a device check from home on 09/15/13. You may send your transmission at any time that day. If you have a wireless device, the transmission will be sent automatically. After your physician reviews your transmission, you will receive a postcard with your next transmission date.  INCREASE CARVEDILOL TO 25MG  TWICE DAILY

## 2013-06-13 NOTE — Progress Notes (Signed)
Patient Care Team: Philemon Kingdom as PCP - General (Internal Medicine)   HPI  Connor Wilkerson is a 72 y.o. male  comes in follow-up for his ICD implanted in the setting of ischemic heart disease. He is doing quite well without significant problems of chest pain shortness of breath or peripheral edema  He has some discomfort in his device pocket. He also describes range of motion issues moving across his left chest. He saw his primary cardiologist, [facility], a few weeks ago; he was felt to be quite stable.  Outside labs were reviewed today.     Past Medical History  Diagnosis Date  . Dyspnea   . Ischemic cardiomyopathy   . ICD (implantable cardiac defibrillator)  BSx     Past Surgical History  Procedure Laterality Date  . Cardiac defibrillator placement    . Coronary artery bypass graft    . Hernia repair      x2    Current Outpatient Prescriptions  Medication Sig Dispense Refill  . amLODipine (NORVASC) 5 MG tablet Take 5 mg by mouth daily.        Marland Kitchen aspirin 81 MG tablet 2 TABS PO QD      . buPROPion (WELLBUTRIN XL) 300 MG 24 hr tablet Take 300 mg by mouth daily.        . carvedilol (COREG) 25 MG tablet Take 25 mg by mouth daily.       Marland Kitchen escitalopram (LEXAPRO) 10 MG tablet Take 10 mg by mouth daily.       . famotidine (PEPCID) 20 MG tablet Take 20 mg by mouth 2 (two) times daily.      Marland Kitchen gabapentin (NEURONTIN) 300 MG capsule Take 300 mg by mouth every evening.        . isosorbide mononitrate (IMDUR) 60 MG 24 hr tablet Take 60 mg by mouth 2 (two) times daily.        . pantoprazole (PROTONIX) 40 MG tablet Take 40 mg by mouth daily.        . quinapril (ACCUPRIL) 20 MG tablet Take 20 mg by mouth 2 (two) times daily.       . rosuvastatin (CRESTOR) 20 MG tablet Take 20 mg by mouth daily.        . TraZODone & Diet Manage Prod (TRAZAMINE) 50 MG MISC Take 50 mg by mouth.      . valsartan (DIOVAN) 320 MG tablet Take 320 mg by mouth daily.        Marland Kitchen zolpidem (AMBIEN) 10 MG tablet Take  10 mg by mouth at bedtime as needed.         No current facility-administered medications for this visit.    Allergies  Allergen Reactions  . Tramadol     Review of Systems negative except from HPI and PMH  Physical Exam BP 162/88  Pulse 70  Ht 5\' 10"  (1.778 m)  Wt 157 lb 9.6 oz (71.487 kg)  BMI 22.61 kg/m2 Well developed and well nourished in no acute distress HENT normal E scleral and icterus clear Neck Supple JVP flat; carotids brisk and full Clear to ausculation Device pocket well healed; without hematoma or erythema.  There is no tethering Regular rate and rhythm, no murmurs gallops or rub Soft with active bowel sounds No clubbing cyanosis none Edema Alert and oriented, grossly normal motor and sensory function Skin Warm and Dry  Atrial pacing with PACs and occasional PVCs of  Assessment and  Plan

## 2013-06-13 NOTE — Assessment & Plan Note (Signed)
As above.

## 2013-06-20 ENCOUNTER — Ambulatory Visit (INDEPENDENT_AMBULATORY_CARE_PROVIDER_SITE_OTHER): Payer: Medicare Other | Admitting: Neurology

## 2013-06-20 ENCOUNTER — Ambulatory Visit (INDEPENDENT_AMBULATORY_CARE_PROVIDER_SITE_OTHER): Payer: Medicare Other

## 2013-06-20 DIAGNOSIS — R209 Unspecified disturbances of skin sensation: Secondary | ICD-10-CM

## 2013-06-20 DIAGNOSIS — G5620 Lesion of ulnar nerve, unspecified upper limb: Secondary | ICD-10-CM

## 2013-06-20 DIAGNOSIS — Z0289 Encounter for other administrative examinations: Secondary | ICD-10-CM

## 2013-06-20 NOTE — Procedures (Signed)
  HISTORY:  Connor Wilkerson is a 72 year old gentleman with a two-month history of numbness involving the entirety of the left hand. The patient also reports some pain in the left shoulder. The patient is being evaluated for a possible neuropathy or a cervical radiculopathy.  NERVE CONDUCTION STUDIES:  Nerve conduction studies were performed on both upper extremities. The distal motor latencies for the median and ulnar nerves were normal bilaterally. The motor amplitudes for the median nerves were normal bilaterally, and normal for the left ulnar nerve, low for the right ulnar nerve. The F wave latencies and nerve conduction velocities for the median nerves were normal bilaterally. The F wave latencies for the ulnar nerves were prolonged bilaterally, with significant slowing seen above and below the elbow for the ulnar nerves bilaterally. The sensory latencies for the median nerves are normal bilaterally, prolonged for the right ulnar nerve and absent for the left ulnar nerve.  EMG STUDIES:  EMG study was performed on the left upper extremity:  The first dorsal interosseous muscle reveals 2 to 6 K units with markedly reduced recruitment. No fibrillations or positive waves were noted. The abductor pollicis brevis muscle reveals 2 to 5 K units with full recruitment. No fibrillations or positive waves were noted. The extensor indicis proprius muscle reveals 1 to 3 K units with full recruitment. No fibrillations or positive waves were noted. The flexor digitorum profundus muscle (III-IV) reveals 2 to 5 K units with decreased recruitment. No fibrillations or positive waves were seen. The pronator teres muscle reveals 2 to 3 K units with full recruitment. No fibrillations or positive waves were noted. The biceps muscle reveals 1 to 2 K units with full recruitment. No fibrillations or positive waves were noted. The triceps muscle reveals 2 to 3 K units with full recruitment. No fibrillations or positive  waves were noted. The anterior deltoid muscle reveals 2 to 3 K units with full recruitment. No fibrillations or positive waves were noted. The cervical paraspinal muscles were tested at 2 levels. No abnormalities of insertional activity were seen at either level tested. There was good relaxation.   IMPRESSION:  Nerve conduction studies done on both upper extremities shows evidence of bilateral tardy ulnar neuropathies, with mixed demyelinating and axonal features on the right, mainly with demyelinating features on the left. EMG of the left upper extremity shows findings consistent with the ulnar neuropathy, without evidence of an overlying cervical radiculopathy.  Marlan Palau MD 06/20/2013 1:52 PM  [facility] [address] [place], [place] [address]  Phone [phone] Fax [phone]

## 2013-06-30 DIAGNOSIS — Z86010 Personal history of colonic polyps: Secondary | ICD-10-CM | POA: Diagnosis not present

## 2013-06-30 DIAGNOSIS — R634 Abnormal weight loss: Secondary | ICD-10-CM | POA: Diagnosis not present

## 2013-06-30 DIAGNOSIS — K2970 Gastritis, unspecified, without bleeding: Secondary | ICD-10-CM | POA: Diagnosis not present

## 2013-06-30 DIAGNOSIS — K2270 Barrett's esophagus without dysplasia: Secondary | ICD-10-CM | POA: Diagnosis not present

## 2013-07-15 DIAGNOSIS — M19019 Primary osteoarthritis, unspecified shoulder: Secondary | ICD-10-CM | POA: Diagnosis not present

## 2013-07-15 DIAGNOSIS — M19029 Primary osteoarthritis, unspecified elbow: Secondary | ICD-10-CM | POA: Diagnosis not present

## 2013-07-15 DIAGNOSIS — G5620 Lesion of ulnar nerve, unspecified upper limb: Secondary | ICD-10-CM | POA: Diagnosis not present

## 2013-07-19 DIAGNOSIS — R634 Abnormal weight loss: Secondary | ICD-10-CM | POA: Diagnosis not present

## 2013-07-19 DIAGNOSIS — E785 Hyperlipidemia, unspecified: Secondary | ICD-10-CM | POA: Diagnosis not present

## 2013-07-19 DIAGNOSIS — M25519 Pain in unspecified shoulder: Secondary | ICD-10-CM | POA: Diagnosis not present

## 2013-07-19 DIAGNOSIS — I2510 Atherosclerotic heart disease of native coronary artery without angina pectoris: Secondary | ICD-10-CM | POA: Diagnosis not present

## 2013-07-19 DIAGNOSIS — I10 Essential (primary) hypertension: Secondary | ICD-10-CM | POA: Diagnosis not present

## 2013-07-19 DIAGNOSIS — Z79899 Other long term (current) drug therapy: Secondary | ICD-10-CM | POA: Diagnosis not present

## 2013-07-25 DIAGNOSIS — I2510 Atherosclerotic heart disease of native coronary artery without angina pectoris: Secondary | ICD-10-CM | POA: Diagnosis not present

## 2013-07-25 DIAGNOSIS — I10 Essential (primary) hypertension: Secondary | ICD-10-CM | POA: Diagnosis not present

## 2013-07-25 DIAGNOSIS — I2589 Other forms of chronic ischemic heart disease: Secondary | ICD-10-CM | POA: Diagnosis not present

## 2013-07-25 DIAGNOSIS — E119 Type 2 diabetes mellitus without complications: Secondary | ICD-10-CM | POA: Diagnosis not present

## 2013-07-25 DIAGNOSIS — E7800 Pure hypercholesterolemia, unspecified: Secondary | ICD-10-CM | POA: Diagnosis not present

## 2013-07-25 DIAGNOSIS — I509 Heart failure, unspecified: Secondary | ICD-10-CM | POA: Diagnosis not present

## 2013-08-03 ENCOUNTER — Telehealth: Payer: Self-pay | Admitting: Internal Medicine

## 2013-08-03 NOTE — Telephone Encounter (Signed)
New Problem  [facility] has not heard from Dr. Odessa Fleming office in regards to the surgery for his elbow/// Please call [facility] orthopedic Dr. Ernesta Amble @ [phone].   Or the Surgical Coordinator @ [phone] for clearance.

## 2013-08-04 ENCOUNTER — Telehealth: Payer: Self-pay | Admitting: Internal Medicine

## 2013-08-04 NOTE — Telephone Encounter (Signed)
Received request from Nurse, documents faxed for surgical clearance. To: [facility] Fax number: [phone] Attention: 08/04/13/KM

## 2013-08-04 NOTE — Telephone Encounter (Signed)
Faxed clearance with instructions to office

## 2013-08-16 DIAGNOSIS — L821 Other seborrheic keratosis: Secondary | ICD-10-CM | POA: Diagnosis not present

## 2013-08-16 DIAGNOSIS — L570 Actinic keratosis: Secondary | ICD-10-CM | POA: Diagnosis not present

## 2013-08-16 DIAGNOSIS — L578 Other skin changes due to chronic exposure to nonionizing radiation: Secondary | ICD-10-CM | POA: Diagnosis not present

## 2013-08-19 DIAGNOSIS — R945 Abnormal results of liver function studies: Secondary | ICD-10-CM | POA: Diagnosis not present

## 2013-08-31 DIAGNOSIS — K2270 Barrett's esophagus without dysplasia: Secondary | ICD-10-CM | POA: Diagnosis not present

## 2013-08-31 DIAGNOSIS — K219 Gastro-esophageal reflux disease without esophagitis: Secondary | ICD-10-CM | POA: Diagnosis not present

## 2013-08-31 DIAGNOSIS — R634 Abnormal weight loss: Secondary | ICD-10-CM | POA: Diagnosis not present

## 2013-09-12 DIAGNOSIS — K219 Gastro-esophageal reflux disease without esophagitis: Secondary | ICD-10-CM | POA: Diagnosis not present

## 2013-09-12 DIAGNOSIS — G4700 Insomnia, unspecified: Secondary | ICD-10-CM | POA: Diagnosis not present

## 2013-09-12 DIAGNOSIS — M25519 Pain in unspecified shoulder: Secondary | ICD-10-CM | POA: Diagnosis not present

## 2013-09-12 DIAGNOSIS — I10 Essential (primary) hypertension: Secondary | ICD-10-CM | POA: Diagnosis not present

## 2013-09-12 DIAGNOSIS — I2510 Atherosclerotic heart disease of native coronary artery without angina pectoris: Secondary | ICD-10-CM | POA: Diagnosis not present

## 2013-09-12 DIAGNOSIS — F329 Major depressive disorder, single episode, unspecified: Secondary | ICD-10-CM | POA: Diagnosis not present

## 2013-09-15 ENCOUNTER — Ambulatory Visit: Payer: Medicare Other | Admitting: *Deleted

## 2013-09-20 LAB — MDC_IDC_ENUM_SESS_TYPE_REMOTE
Battery Voltage: 2.5 V
Brady Statistic RA Percent Paced: 58 %
Brady Statistic RV Percent Paced: 1 %
Date Time Interrogation Session: 20141113140821
HighPow Impedance: 42 Ohm
Implantable Pulse Generator Serial Number: 105439
Lead Channel Impedance Value: 384 Ohm
Lead Channel Sensing Intrinsic Amplitude: 8 mV
Lead Channel Setting Pacing Amplitude: 2.4 V
Lead Channel Setting Pacing Pulse Width: 0.4 ms
Zone Setting Detection Interval: 250 ms
Zone Setting Detection Interval: 285.7 ms

## 2013-09-23 ENCOUNTER — Encounter (INDEPENDENT_AMBULATORY_CARE_PROVIDER_SITE_OTHER): Payer: Self-pay

## 2013-09-23 ENCOUNTER — Encounter: Payer: Self-pay | Admitting: *Deleted

## 2013-09-23 ENCOUNTER — Ambulatory Visit (INDEPENDENT_AMBULATORY_CARE_PROVIDER_SITE_OTHER): Payer: Medicare Other | Admitting: Cardiology

## 2013-09-23 ENCOUNTER — Encounter: Payer: Self-pay | Admitting: Cardiology

## 2013-09-23 VITALS — BP 176/100 | HR 61 | Ht 70.0 in | Wt 163.0 lb

## 2013-09-23 DIAGNOSIS — I2589 Other forms of chronic ischemic heart disease: Secondary | ICD-10-CM

## 2013-09-23 DIAGNOSIS — I5022 Chronic systolic (congestive) heart failure: Secondary | ICD-10-CM

## 2013-09-23 DIAGNOSIS — Z4502 Encounter for adjustment and management of automatic implantable cardiac defibrillator: Secondary | ICD-10-CM | POA: Diagnosis not present

## 2013-09-23 DIAGNOSIS — Z95810 Presence of automatic (implantable) cardiac defibrillator: Secondary | ICD-10-CM | POA: Diagnosis not present

## 2013-09-23 DIAGNOSIS — I255 Ischemic cardiomyopathy: Secondary | ICD-10-CM

## 2013-09-23 LAB — MDC_IDC_ENUM_SESS_TYPE_INCLINIC
HighPow Impedance: 45 Ohm
Implantable Pulse Generator Serial Number: 105439
Lead Channel Impedance Value: 400 Ohm
Lead Channel Impedance Value: 632 Ohm
Lead Channel Pacing Threshold Pulse Width: 0.4 ms
Lead Channel Pacing Threshold Pulse Width: 0.4 ms
Lead Channel Sensing Intrinsic Amplitude: 7.8 mV

## 2013-09-23 NOTE — Patient Instructions (Addendum)
Your physician has requested that you have an echocardiogram. Echocardiography is a painless test that uses sound waves to create images of your heart. It provides your doctor with information about the size and shape of your heart and how well your heart's chambers and valves are working. This procedure takes approximately one hour. There are no restrictions for this procedure.  Your physician has recommended that you have a defibrillator inserted. An implantable cardioverter defibrillator (ICD) is a small device that is placed in your chest or, in rare cases, your abdomen. This device uses electrical pulses or shocks to help control life-threatening, irregular heartbeats that could lead the heart to suddenly stop beating (sudden cardiac arrest). Leads are attached to the ICD that goes into your heart. This is done in the hospital and usually requires an overnight stay. Please see the instruction sheet given to you today for more information.  Your physician recommends that you return for lab work in: 10/11/2013 ([email or web address])  Your physician recommends that you continue on your current medications as directed. Please refer to the Current Medication list given to you today.

## 2013-09-23 NOTE — Progress Notes (Addendum)
ELECTROPHYSIOLOGY OFFICE NOTE  Patient ID: Connor Wilkerson MRN: [id number], DOB/AGE: [birth year]   Date of Visit: 09/23/2013  Primary Physician: Philemon Kingdom, MD Primary Cardiologist: Sharyn Lull, MD Primary EP: Graciela Husbands, MD Reason for Visit: EP/device follow-up  History of Present Illness  Connor Wilkerson is a 72 y.o. male with an ischemic CM, EF 35%, s/p ICD implant for primary prevention, chronic systolic HF and CAD s/p CABG who presents today for routine electrophysiology followup. He is accompanied by his wife. His ICD battery is at [facility].  Since last being seen in our clinic, he reports he is doing well and has no complaints. He denies chest pain or shortness of breath. He denies palpitations, dizziness, near syncope or syncope. He denies LE swelling, orthopnea, PND or recent weight gain. He denies ICD shocks. He is compliant and tolerating medications without difficulty.  Past Medical History Past Medical History  Diagnosis Date  . Dyspnea   . Ischemic cardiomyopathy   . ICD (implantable cardiac defibrillator)  BSx     Past Surgical History Past Surgical History  Procedure Laterality Date  . Cardiac defibrillator placement    . Coronary artery bypass graft    . Hernia repair      x2    Allergies/Intolerances Allergies  Allergen Reactions  . Tramadol     Current Home Medications Current Outpatient Prescriptions  Medication Sig Dispense Refill  . amLODipine (NORVASC) 5 MG tablet Take 5 mg by mouth daily.        Marland Kitchen aspirin 81 MG tablet 2 TABS PO QD      . buPROPion (WELLBUTRIN XL) 300 MG 24 hr tablet Take 300 mg by mouth daily.        . carvedilol (COREG) 25 MG tablet Take 1 tablet (25 mg total) by mouth 2 (two) times daily with a meal.  60 tablet  12  . cyanocobalamin 1000 MCG tablet Take 6,000 mcg by mouth daily.      Marland Kitchen escitalopram (LEXAPRO) 10 MG tablet Take 10 mg by mouth daily.       . famotidine (PEPCID) 20 MG tablet Take 20 mg by mouth at bedtime.       .  gabapentin (NEURONTIN) 300 MG capsule Take 300 mg by mouth every evening.        . isosorbide mononitrate (IMDUR) 60 MG 24 hr tablet Take 60 mg by mouth 2 (two) times daily.        Marland Kitchen oxyCODONE-acetaminophen (PERCOCET) 10-325 MG per tablet Take 1 tablet by mouth every 4 (four) hours as needed for pain.      . pantoprazole (PROTONIX) 40 MG tablet Take 40 mg by mouth 2 (two) times daily.       . quinapril (ACCUPRIL) 20 MG tablet Take 20 mg by mouth 2 (two) times daily.       . rosuvastatin (CRESTOR) 20 MG tablet Take 20 mg by mouth daily.        . TraZODone & Diet Manage Prod (TRAZAMINE) 50 MG MISC Take 50 mg by mouth as needed.       . valsartan (DIOVAN) 320 MG tablet Take 320 mg by mouth daily.        Marland Kitchen zolpidem (AMBIEN) 10 MG tablet Take 10 mg by mouth at bedtime as needed.         No current facility-administered medications for this visit.    Social History Social History  . Marital Status: Married   Social History Main Topics  . Smoking status:  Former Smoker  . Smokeless tobacco: No     Comment: quit in 1998  . Alcohol Use: No     Comment: recovering alcoholic, quit 20+ yrs ago  . Drug Use: No   Review of Systems General: No chills, fever, night sweats or weight changes Cardiovascular: No chest pain, dyspnea on exertion, edema, orthopnea, palpitations, paroxysmal nocturnal dyspnea Dermatological: No rash, lesions or masses Respiratory: No cough, dyspnea Urologic: No hematuria, dysuria Abdominal: No nausea, vomiting, diarrhea, bright red blood per rectum, melena, or hematemesis Neurologic: No visual changes, weakness, changes in mental status All other systems reviewed and are otherwise negative except as noted above.  Physical Exam Vitals: Blood pressure 176/100, pulse 61, height 5\' 10"  (1.778 m), weight 163 lb (73.936 kg), SpO2 97.00%.  General: Well developed, well appearing 72 y.o. male in no acute distress. HEENT: Normocephalic, atraumatic. EOMs intact. Sclera nonicteric.  Oropharynx clear.  Neck: Supple. No JVD. Lungs: Respirations regular and unlabored, CTA bilaterally. No wheezes, rales or rhonchi. Heart: RRR. S1, S2 present. No murmurs, rub, S3 or S4. Abdomen: Soft, non-distended.   Extremities: No clubbing, cyanosis or edema. PT/Radials 2+ and equal bilaterally. Psych: Normal affect. Neuro: Alert and oriented X 3. Moves all extremities spontaneously. Skin: Left upper chest / implant site intact and well healed.   Diagnostics Echocardiogram - from Dr. Annitta Jersey office in 2011 - EF 35-40% 12-lead ECG today - A paced V sensed at 60 bpm; no ST-T wave abnormalities; QRS duration 92 msec Device interrogation today - Battery at ERI since 09/09/2013. Otherwise normal device function. Thresholds and sensing consistent with previous device measurements. Impedance trends stable over time. No evidence of any ventricular arrhythmias. 2 ATR episodes, longest 10 minutes 27 seconds, EGMs consistent with an atrial tachycardia. Histogram distribution appropriate for patient and level of activity. No changes made this session. Device programmed at appropriate safety margins. Device programmed to optimize intrinsic conduction. Will schedule echo and ICD generator change with Dr. Graciela Husbands at next available time.  Assessment and Plan 1. ICD battery at [facility] 2. Ischemic CM, EF 35-40% in 2011 3. Chronic systolic HF 4. CAD s/p CABG  Connor Wilkerson presents for EP follow-up. His ICD battery is at [facility]. Discussed the need for ICD generator change. Risks, benefits and alternatives to ICD generator change were discussed in detail. These risks include, but are not limited to, bleeding and infection. As required for NCDR ICD registry, will update echo. He does not meet criteria for CRT as he has a narrow QRS. Connor Wilkerson expressed verbal understanding and wishes to proceed. This will be scheduled with Dr. Graciela Husbands at his next available time.   Signed, Rick Duff, PA-C 09/23/2013, 4:22  PM

## 2013-09-27 ENCOUNTER — Encounter: Payer: Self-pay | Admitting: Internal Medicine

## 2013-10-03 ENCOUNTER — Encounter (HOSPITAL_COMMUNITY): Payer: Self-pay | Admitting: Pharmacy Technician

## 2013-10-11 ENCOUNTER — Telehealth: Payer: Self-pay | Admitting: *Deleted

## 2013-10-11 ENCOUNTER — Other Ambulatory Visit (INDEPENDENT_AMBULATORY_CARE_PROVIDER_SITE_OTHER): Payer: Medicare Other

## 2013-10-11 ENCOUNTER — Ambulatory Visit (HOSPITAL_COMMUNITY): Payer: Medicare Other | Attending: Internal Medicine | Admitting: Radiology

## 2013-10-11 DIAGNOSIS — I2510 Atherosclerotic heart disease of native coronary artery without angina pectoris: Secondary | ICD-10-CM | POA: Insufficient documentation

## 2013-10-11 DIAGNOSIS — I428 Other cardiomyopathies: Secondary | ICD-10-CM | POA: Diagnosis not present

## 2013-10-11 DIAGNOSIS — I252 Old myocardial infarction: Secondary | ICD-10-CM | POA: Diagnosis not present

## 2013-10-11 DIAGNOSIS — I079 Rheumatic tricuspid valve disease, unspecified: Secondary | ICD-10-CM | POA: Diagnosis not present

## 2013-10-11 DIAGNOSIS — I5022 Chronic systolic (congestive) heart failure: Secondary | ICD-10-CM

## 2013-10-11 DIAGNOSIS — Z4502 Encounter for adjustment and management of automatic implantable cardiac defibrillator: Secondary | ICD-10-CM

## 2013-10-11 DIAGNOSIS — I10 Essential (primary) hypertension: Secondary | ICD-10-CM | POA: Insufficient documentation

## 2013-10-11 DIAGNOSIS — Z95810 Presence of automatic (implantable) cardiac defibrillator: Secondary | ICD-10-CM

## 2013-10-11 DIAGNOSIS — I2589 Other forms of chronic ischemic heart disease: Secondary | ICD-10-CM

## 2013-10-11 DIAGNOSIS — I255 Ischemic cardiomyopathy: Secondary | ICD-10-CM

## 2013-10-11 LAB — CBC WITH DIFFERENTIAL/PLATELET
Basophils Absolute: 0 10*3/uL (ref 0.0–0.1)
HCT: 42.2 % (ref 39.0–52.0)
Hemoglobin: 14.1 g/dL (ref 13.0–17.0)
Lymphocytes Relative: 22.6 % (ref 12.0–46.0)
Lymphs Abs: 2 10*3/uL (ref 0.7–4.0)
MCV: 103.2 fl — ABNORMAL HIGH (ref 78.0–100.0)
Monocytes Absolute: 0.8 10*3/uL (ref 0.1–1.0)
Monocytes Relative: 8.7 % (ref 3.0–12.0)
Neutro Abs: 5.7 10*3/uL (ref 1.4–7.7)
Platelets: 181 10*3/uL (ref 150.0–400.0)
RDW: 13 % (ref 11.5–14.6)

## 2013-10-11 LAB — BASIC METABOLIC PANEL
Calcium: 9.4 mg/dL (ref 8.4–10.5)
Chloride: 99 mEq/L (ref 96–112)
GFR: 59.74 mL/min — ABNORMAL LOW (ref >60.00)
Glucose, Bld: 134 mg/dL — ABNORMAL HIGH (ref 70–99)
Sodium: 138 mEq/L (ref 135–145)

## 2013-10-11 LAB — PROTIME-INR: Prothrombin Time: 11.1 s (ref 10.2–12.4)

## 2013-10-11 NOTE — Progress Notes (Signed)
Echocardiogram performed.  

## 2013-10-11 NOTE — Telephone Encounter (Signed)
Spoke to patient's wife (patient was driving) - informed them of procedural time change for 10/17/13. I explained that they needed to be at the hospital at 7:00 am (procedure time moved up) for procedure at 9:00am. They are agreeable with plan.

## 2013-10-14 ENCOUNTER — Telehealth: Payer: Self-pay | Admitting: *Deleted

## 2013-10-14 NOTE — Telephone Encounter (Signed)
Called patient to cancel Monday generator change - his echo showed improvement and Dr. Graciela Husbands would like to see him in office to discuss options. Called cath lab and cancelled procedure. Patient scheduled to see [place] 12/17 at 2pm. Patient agreeable to plan.

## 2013-10-17 ENCOUNTER — Ambulatory Visit (HOSPITAL_COMMUNITY): Admission: RE | Admit: 2013-10-17 | Payer: Medicare Other | Source: Ambulatory Visit | Admitting: Internal Medicine

## 2013-10-17 ENCOUNTER — Encounter (HOSPITAL_COMMUNITY): Admission: RE | Payer: Self-pay | Source: Ambulatory Visit

## 2013-10-17 SURGERY — ICD GENERATOR CHANGE
Anesthesia: LOCAL

## 2013-10-19 ENCOUNTER — Encounter: Payer: Self-pay | Admitting: *Deleted

## 2013-10-19 ENCOUNTER — Ambulatory Visit (INDEPENDENT_AMBULATORY_CARE_PROVIDER_SITE_OTHER): Payer: Medicare Other | Admitting: Internal Medicine

## 2013-10-19 ENCOUNTER — Encounter: Payer: Self-pay | Admitting: Internal Medicine

## 2013-10-19 VITALS — BP 150/90 | HR 60 | Ht 70.0 in | Wt 167.1 lb

## 2013-10-19 DIAGNOSIS — Z01812 Encounter for preprocedural laboratory examination: Secondary | ICD-10-CM

## 2013-10-19 DIAGNOSIS — I2589 Other forms of chronic ischemic heart disease: Secondary | ICD-10-CM

## 2013-10-19 DIAGNOSIS — I110 Hypertensive heart disease with heart failure: Secondary | ICD-10-CM | POA: Diagnosis not present

## 2013-10-19 DIAGNOSIS — Z95810 Presence of automatic (implantable) cardiac defibrillator: Secondary | ICD-10-CM

## 2013-10-19 NOTE — Assessment & Plan Note (Signed)
We have reviewed the benefits and risks of generator replacement.  These include but are not limited to lead fracture and infection.  The patient understands, agrees and is willing to proceed.   He does not want device prepectoral so descussed removal vs subpectoral location  He would like to proceed with the latter

## 2013-10-19 NOTE — Patient Instructions (Addendum)
Your physician has recommended that you have a pacemaker generator change on 11/10/2013. Please see letter of instructions given today.  Pre procedure lab work scheduled for 11/01/2013

## 2013-10-19 NOTE — Assessment & Plan Note (Signed)
Poorly controlled owuld consider sleep study and renal arterial doppler  Will defer to Dr [facility]

## 2013-10-19 NOTE — Progress Notes (Signed)
Patient Care Team: Philemon Kingdom as PCP - General (Internal Medicine) Sherrian Divers, MD as Consulting Physician (Rheumatology)   HPI  Connor Wilkerson is a 72 y.o. male Seen in followup for ICD implanted for primary prevention. His device has reached ERI.  He is a history of coronary disease with prior bypass. Recent ejection fraction was 45%.  He is here to discuss generator replacement in tlight of the above  He has modest symptoms of dyspnea but considerable fatigue  Past Medical History  Diagnosis Date  . Dyspnea   . Ischemic cardiomyopathy   . ICD (implantable cardiac defibrillator)  BSx     Past Surgical History  Procedure Laterality Date  . Cardiac defibrillator placement    . Coronary artery bypass graft    . Hernia repair      x2    Current Outpatient Prescriptions  Medication Sig Dispense Refill  . amLODipine (NORVASC) 5 MG tablet Take 5 mg by mouth daily.        Marland Kitchen aspirin 81 MG tablet Take 162 mg by mouth daily.       Marland Kitchen buPROPion (WELLBUTRIN XL) 300 MG 24 hr tablet Take 300 mg by mouth daily.        . carvedilol (COREG) 25 MG tablet Take 1 tablet (25 mg total) by mouth 2 (two) times daily with a meal.  60 tablet  12  . Cyanocobalamin (VITAMIN B-12) 6000 MCG SUBL Place 6,000 mcg under the tongue daily.      Marland Kitchen escitalopram (LEXAPRO) 10 MG tablet Take 10 mg by mouth at bedtime.       . famotidine (PEPCID) 20 MG tablet Take 20 mg by mouth 2 (two) times daily.       Marland Kitchen gabapentin (NEURONTIN) 300 MG capsule Take 300 mg by mouth 2 (two) times daily as needed (leg/shoulder pain).       . isosorbide mononitrate (IMDUR) 60 MG 24 hr tablet Take 60 mg by mouth 2 (two) times daily.        . nitroGLYCERIN (NITROSTAT) 0.4 MG SL tablet Place 0.4 mg under the tongue every 5 (five) minutes as needed for chest pain.      Marland Kitchen oxyCODONE-acetaminophen (PERCOCET) 10-325 MG per tablet Take 1 tablet by mouth every 4 (four) hours as needed for pain.      .  pantoprazole (PROTONIX) 40 MG tablet Take 40 mg by mouth 2 (two) times daily.       Marland Kitchen PRESCRIPTION MEDICATION Apply 1 application topically 2 (two) times daily as needed (for shoulder pain). COMPOUNDED CREAM: ketoprofen 20%, baclofen 20%, cyclobenzaprine 20%, gabapentin 6%, and lidocaine 2.5%      . quinapril (ACCUPRIL) 20 MG tablet Take 20 mg by mouth 2 (two) times daily.       . rosuvastatin (CRESTOR) 20 MG tablet Take 20 mg by mouth daily.        . traZODone (DESYREL) 50 MG tablet Take 50 mg by mouth at bedtime as needed for sleep.      . valsartan (DIOVAN) 320 MG tablet Take 320 mg by mouth daily.         No current facility-administered medications for this visit.    Allergies  Allergen Reactions  . Tramadol Other (See Comments)    Unknown     Review of Systems negative except from HPI and PMH  Physical Exam Ht 5\' 10"  (1.778 m)  Wt 167 lb 1.9 oz (75.805 kg)  BMI 23.98  kg/m2 Well developed and well nourished in no acute distress HENT normal E scleral and icterus clear Neck Supple JVP flat; carotids brisk and full Clear to ausculation Device pocket well healed; without hematoma or erythema.  There is no tethering  Regular rate and rhythm, no murmurs gallops or rub Soft with active bowel sounds No clubbing cyanosis none Edema Alert and oriented, grossly normal motor and sensory function Skin Warm and Dry  ECG 21/nocv14>>Apaced 60  [birth year]  Assessment and  Plan

## 2013-10-19 NOTE — Assessment & Plan Note (Signed)
contnue current meds 

## 2013-10-20 ENCOUNTER — Telehealth: Payer: Self-pay | Admitting: Internal Medicine

## 2013-10-20 NOTE — Telephone Encounter (Signed)
New Prob     Pts wife states she would like to reschedule defib replacement to after 1/20 for pt. Please call.

## 2013-10-20 NOTE — Telephone Encounter (Signed)
PPM Gen change re-scheduled for 1/23 at 7:30am, pt to be at hospital at 5:30am. Lab re-scheduled for 12/21.

## 2013-10-24 DIAGNOSIS — I428 Other cardiomyopathies: Secondary | ICD-10-CM | POA: Diagnosis not present

## 2013-10-24 DIAGNOSIS — I2510 Atherosclerotic heart disease of native coronary artery without angina pectoris: Secondary | ICD-10-CM | POA: Diagnosis not present

## 2013-10-24 DIAGNOSIS — E7800 Pure hypercholesterolemia, unspecified: Secondary | ICD-10-CM | POA: Diagnosis not present

## 2013-10-24 DIAGNOSIS — I509 Heart failure, unspecified: Secondary | ICD-10-CM | POA: Diagnosis not present

## 2013-10-24 DIAGNOSIS — I10 Essential (primary) hypertension: Secondary | ICD-10-CM | POA: Diagnosis not present

## 2013-10-28 ENCOUNTER — Encounter: Payer: Self-pay | Admitting: Internal Medicine

## 2013-11-01 ENCOUNTER — Other Ambulatory Visit: Payer: Medicare Other

## 2013-11-11 ENCOUNTER — Encounter (HOSPITAL_COMMUNITY): Payer: Self-pay | Admitting: Pharmacy Technician

## 2013-11-23 ENCOUNTER — Other Ambulatory Visit (INDEPENDENT_AMBULATORY_CARE_PROVIDER_SITE_OTHER): Payer: Medicare Other

## 2013-11-23 DIAGNOSIS — Z01812 Encounter for preprocedural laboratory examination: Secondary | ICD-10-CM

## 2013-11-23 DIAGNOSIS — I2589 Other forms of chronic ischemic heart disease: Secondary | ICD-10-CM | POA: Diagnosis not present

## 2013-11-23 DIAGNOSIS — Z95810 Presence of automatic (implantable) cardiac defibrillator: Secondary | ICD-10-CM | POA: Diagnosis not present

## 2013-11-23 LAB — CBC WITH DIFFERENTIAL/PLATELET
Basophils Absolute: 0 10*3/uL (ref 0.0–0.1)
Basophils Relative: 0.6 % (ref 0.0–3.0)
Eosinophils Absolute: 0.2 10*3/uL (ref 0.0–0.7)
Eosinophils Relative: 3.1 % (ref 0.0–5.0)
HCT: 37 % — ABNORMAL LOW (ref 39.0–52.0)
Hemoglobin: 12.6 g/dL — ABNORMAL LOW (ref 13.0–17.0)
Lymphocytes Relative: 38.5 % (ref 12.0–46.0)
Lymphs Abs: 2.3 10*3/uL (ref 0.7–4.0)
MCHC: 34 g/dL (ref 30.0–36.0)
MONO ABS: 0.7 10*3/uL (ref 0.1–1.0)
Monocytes Relative: 12.2 % — ABNORMAL HIGH (ref 3.0–12.0)
Neutro Abs: 2.8 10*3/uL (ref 1.4–7.7)
Neutrophils Relative %: 45.6 % (ref 43.0–77.0)
PLATELETS: 182 10*3/uL (ref 150.0–400.0)
RBC: 3.66 Mil/uL — ABNORMAL LOW (ref 4.22–5.81)
RDW: 12.5 % (ref 11.5–14.6)

## 2013-11-23 LAB — BASIC METABOLIC PANEL
BUN: 21 mg/dL (ref 6–23)
CHLORIDE: 103 meq/L (ref 96–112)
CO2: 33 mEq/L — ABNORMAL HIGH (ref 19–32)
CREATININE: 1.4 mg/dL (ref 0.4–1.5)
Calcium: 9 mg/dL (ref 8.4–10.5)
GFR: 54.69 mL/min — ABNORMAL LOW (ref >60.00)
Glucose, Bld: 102 mg/dL — ABNORMAL HIGH (ref 70–99)
Potassium: 3.1 mEq/L — ABNORMAL LOW (ref 3.5–5.1)
Sodium: 141 mEq/L (ref 135–145)

## 2013-11-24 ENCOUNTER — Telehealth: Payer: Self-pay | Admitting: *Deleted

## 2013-11-24 DIAGNOSIS — Z951 Presence of aortocoronary bypass graft: Secondary | ICD-10-CM | POA: Diagnosis not present

## 2013-11-24 DIAGNOSIS — I2510 Atherosclerotic heart disease of native coronary artery without angina pectoris: Secondary | ICD-10-CM | POA: Diagnosis not present

## 2013-11-24 DIAGNOSIS — I2589 Other forms of chronic ischemic heart disease: Secondary | ICD-10-CM | POA: Diagnosis not present

## 2013-11-24 DIAGNOSIS — Z4502 Encounter for adjustment and management of automatic implantable cardiac defibrillator: Secondary | ICD-10-CM | POA: Diagnosis not present

## 2013-11-24 DIAGNOSIS — R0609 Other forms of dyspnea: Secondary | ICD-10-CM | POA: Diagnosis not present

## 2013-11-24 MED ORDER — CEFAZOLIN SODIUM-DEXTROSE 2-3 GM-% IV SOLR
2.0000 g | INTRAVENOUS | Status: DC
  Filled 2013-11-24: qty 50

## 2013-11-24 MED ORDER — SODIUM CHLORIDE 0.9 % IV SOLN: INTRAVENOUS | Status: DC

## 2013-11-24 MED ORDER — CHLORHEXIDINE GLUCONATE 4 % EX LIQD
60.0000 mL | Freq: Once | CUTANEOUS | Status: DC
  Filled 2013-11-24: qty 60

## 2013-11-24 MED ORDER — CEFAZOLIN SODIUM-DEXTROSE 2-3 GM-% IV SOLR
2.0000 g | INTRAVENOUS | Status: DC
  Filled 2013-11-24 (×2): qty 50

## 2013-11-24 MED ORDER — SODIUM CHLORIDE 0.9 % IV SOLN
INTRAVENOUS | Status: DC
  Administered 2013-11-25: 08:00:00 via INTRAVENOUS

## 2013-11-24 MED ORDER — SODIUM CHLORIDE 0.9 % IR SOLN
80.0000 mg | Status: DC
  Filled 2013-11-24: qty 2

## 2013-11-24 NOTE — Telephone Encounter (Signed)
Left message on house phone. Spoke with wife via cell phone - relayed new procedure time tomorrow be at hospital at 7 am for 9am procedure. She will relay message to husband and agreeable to plan.

## 2013-11-25 ENCOUNTER — Encounter (HOSPITAL_COMMUNITY)
Admission: RE | Disposition: A | Discharge status: Home / Self Care | Payer: Self-pay | Source: Ambulatory Visit | Attending: Internal Medicine

## 2013-11-25 ENCOUNTER — Other Ambulatory Visit: Payer: Self-pay | Admitting: *Deleted

## 2013-11-25 ENCOUNTER — Ambulatory Visit (HOSPITAL_COMMUNITY)
Admission: RE | Admit: 2013-11-25 | Discharge: 2013-11-25 | Disposition: A | Discharge status: Home / Self Care | Payer: Medicare Other | Source: Ambulatory Visit | Attending: Internal Medicine | Admitting: Internal Medicine

## 2013-11-25 DIAGNOSIS — R0609 Other forms of dyspnea: Secondary | ICD-10-CM | POA: Insufficient documentation

## 2013-11-25 DIAGNOSIS — I509 Heart failure, unspecified: Secondary | ICD-10-CM

## 2013-11-25 DIAGNOSIS — Z4502 Encounter for adjustment and management of automatic implantable cardiac defibrillator: Secondary | ICD-10-CM | POA: Diagnosis not present

## 2013-11-25 DIAGNOSIS — R0989 Other specified symptoms and signs involving the circulatory and respiratory systems: Secondary | ICD-10-CM | POA: Insufficient documentation

## 2013-11-25 DIAGNOSIS — G4710 Hypersomnia, unspecified: Secondary | ICD-10-CM

## 2013-11-25 DIAGNOSIS — I255 Ischemic cardiomyopathy: Secondary | ICD-10-CM

## 2013-11-25 DIAGNOSIS — Z951 Presence of aortocoronary bypass graft: Secondary | ICD-10-CM | POA: Diagnosis not present

## 2013-11-25 DIAGNOSIS — I110 Hypertensive heart disease with heart failure: Secondary | ICD-10-CM

## 2013-11-25 DIAGNOSIS — I5022 Chronic systolic (congestive) heart failure: Secondary | ICD-10-CM

## 2013-11-25 DIAGNOSIS — I2510 Atherosclerotic heart disease of native coronary artery without angina pectoris: Secondary | ICD-10-CM | POA: Diagnosis not present

## 2013-11-25 DIAGNOSIS — I2589 Other forms of chronic ischemic heart disease: Secondary | ICD-10-CM | POA: Insufficient documentation

## 2013-11-25 DIAGNOSIS — Z95810 Presence of automatic (implantable) cardiac defibrillator: Secondary | ICD-10-CM

## 2013-11-25 HISTORY — PX: PERMANENT PACEMAKER GENERATOR CHANGE: SHX6022

## 2013-11-25 LAB — BASIC METABOLIC PANEL
BUN: 19 mg/dL (ref 6–23)
CO2: 29 meq/L (ref 19–32)
CREATININE: 1.34 mg/dL (ref 0.50–1.35)
Calcium: 9.2 mg/dL (ref 8.4–10.5)
Chloride: 106 mEq/L (ref 96–112)
GFR calc Af Amer: 59 mL/min — ABNORMAL LOW (ref >90)
GFR calc non Af Amer: 51 mL/min — ABNORMAL LOW (ref >90)
GLUCOSE: 114 mg/dL — AB (ref 70–99)
Potassium: 3.8 mEq/L (ref 3.7–5.3)
SODIUM: 147 meq/L (ref 137–147)

## 2013-11-25 LAB — SURGICAL PCR SCREEN
MRSA, PCR: NEGATIVE
STAPHYLOCOCCUS AUREUS: NEGATIVE

## 2013-11-25 SURGERY — PERMANENT PACEMAKER GENERATOR CHANGE
Anesthesia: LOCAL

## 2013-11-25 MED ORDER — ACETAMINOPHEN 325 MG PO TABS
650.0000 mg | ORAL_TABLET | Freq: Four times a day (QID) | ORAL | Status: DC | PRN
  Administered 2013-11-25: 650 mg via ORAL
  Filled 2013-11-25: qty 2

## 2013-11-25 MED ORDER — FENTANYL CITRATE 0.05 MG/ML IJ SOLN
INTRAMUSCULAR | Status: AC
  Filled 2013-11-25: qty 2

## 2013-11-25 MED ORDER — MIDAZOLAM HCL 5 MG/5ML IJ SOLN
INTRAMUSCULAR | Status: AC
  Filled 2013-11-25: qty 5

## 2013-11-25 MED ORDER — MUPIROCIN 2 % EX OINT: TOPICAL_OINTMENT | Freq: Two times a day (BID) | CUTANEOUS | Status: DC

## 2013-11-25 MED ORDER — LIDOCAINE HCL (PF) 1 % IJ SOLN
INTRAMUSCULAR | Status: AC
  Filled 2013-11-25: qty 90

## 2013-11-25 MED ORDER — MUPIROCIN 2 % EX OINT
TOPICAL_OINTMENT | CUTANEOUS | Status: AC
  Administered 2013-11-25: 1
  Filled 2013-11-25: qty 22

## 2013-11-25 NOTE — CV Procedure (Signed)
Preoperative diagnosis  Ischemic cardiomyopathy Postoperative diagnosis same/ protrudiong device  Procedure: Generator replacement   Revision to subpectoral pocket HV lead assessment  Following informed consent the patient was brought to the electrophysiology laboratory in place of the fluoroscopic table in the supine position after routine prep and drape lidocaine was infiltrated in the region of the previous incision and carried down to later the device pocket using sharp dissection and electrocautery. The pocket was opened the device was freed up and was explanted.  Interrogation of the previously implanted ICD ventricular lead [place]  demonstrated an R wave of 10.6  millivolts., and impedance of 683 ohms, and a pacing threshold of 0.7 volts at 0.5 msec.  .    The previously implanted atrial lead Medtronic [id number] demonstrated a P-wave amplitude of 1.9 milllivolts  and impedance of  399 ohms, and a pacing threshold of 0.6 volts at  @ 0.57milliseconds.   The pocket scar was removed on the posterior surface and the pectoralis major muscle was separated and space made to the level of pec minor  At that point a pocket was fashioned above the fascia   The leads were inspected. Repair was not  needed. The leads were then attached to a [employer] pulse generator, serial number [id number].    Through the device the P-wave amplitude  Was  1.8 milllivolts and impedance of  382 ohms, and a pacing threshold of 0.7 volts at   @ 0.15milliseconds; the ICD ventricular lead demonstrated an R wave of 7.8  millivolts., and impedance of 513 ohms, and a pacing threshold of 1.0 volts at 0.4 msec   High voltage impedances were 42 ohms  The pocket was irrigated with antibiotic containing saline solution hemostasis was assured   and the leads and the device were placed in the pocket. And the pectoralis muscle was approximated. The wound was then closed in 3  layers in normal fashion. A  Dermabond dressing was applied  The patient tolerated the procedure without apparent complication.  DFT testing was not  performed  Virl Axe   \

## 2013-11-25 NOTE — Interval H&P Note (Signed)
ICD Criteria  Current LVEF (within 6 months):45%  NYHA Functional Classification: Class II  Heart Failure History:  Yes, Duration of heart failure since onset is > 9 months  Non-Ischemic Dilated Cardiomyopathy History:  No.  Atrial Fibrillation/Atrial Flutter:  No.  Ventricular Tachycardia History:  No.  Cardiac Arrest History:  No  History of Syndromes with Risk of Sudden Death:  No.  Previous ICD:  Yes, ICD Type:  Dual , Reason for ICD:  Primary prevention.  LVEF is not available  Electrophysiology Study: No.  Prior MI: Yes, Most recent MI timeframe is > 40 days.  PPM: No.  OSA:  Yes  Patient Life Expectancy of >=1 year: Yes.  Anticoagulation Therapy:  Patient is NOT on anticoagulation therapy.   Beta Blocker Therapy:  Yes.   Ace Inhibitor/ARB Therapy:  Yes.History and Physical Interval Note:  11/25/2013 8:31 AM  [name]  has presented today for surgery, with the diagnosis of ERI  The various methods of treatment have been discussed with the patient and family. After consideration of risks, benefits and other options for treatment, the patient has consented to  Procedure(s): PERMANENT PACEMAKER GENERATOR CHANGE (N/A) as a surgical intervention .  The patient's history has been reviewed, patient examined, no change in status, stable for surgery.  I have reviewed the patient's chart and labs.  Questions were answered to the patient's satisfaction.     Connor Wilkerson

## 2013-11-25 NOTE — Discharge Instructions (Signed)
Pacemaker Battery Change A pacemaker battery usually lasts 4 to 12 years. Once or twice per year, you will be asked to visit your health care provider to have a full evaluation of your pacemaker. When a battery needs to be replaced, the entire pacemaker is replaced so that you can benefit from new circuitry and any new features that have been added to pacemakers. Most often, this procedure is very simple because the leads are already in place.  There are many things that affect how long a pacemaker battery will last, including:   The age of the pacemaker.   The number of leads (1, 2, or 3).   The pacemaker work load. If the pacemaker is helping the heart more often, the battery will not last as long as it would if the pacemaker did not need to help the heart.   Power (voltage) settings.  LET [facility] CARE PROVIDER KNOW ABOUT:   Any allergies you have.   All medicines you are taking, including vitamins, herbs, eye drops, creams, and over-the-counter medicines.   Previous problems you or members of your family have had with the use of anesthetics.   Any blood disorders you have.   Previous surgeries you have had, especially since your last pacemaker placement.   Medical conditions you have.   Possible pregnancy, if applicable.  Symptoms of chest pain, trouble breathing, palpitations, lightheadedness, or feelings of an abnormal or irregular heartbeat.  RISKS AND COMPLICATIONS  Generally, this is a safe procedure. However, as with any procedure, complications can occur. Possible complications include:   Bleeding.   Bruising of the skin around where the incision was made.   Pain at the incision site.   Pulling apart of the skin at the incision site.   Infection. Red streaks above or below the incision site, drainage or discharge.  Fever or chills.  Allergic reaction to anesthetics or other medicines used during the procedure.   If any of these  complications occur, please contact the physician.  Diabetics may have a temporary increase in their blood sugar after any surgical procedure.  BEFORE THE PROCEDURE   Wash all of the skin around the area of the chest where the pacemaker is located.   Ask your health care provider for help with any medicine adjustments before the pacemaker is replaced.   Unless advised otherwise, do not eat or drink after midnight on the night before the procedure. You may drink water to take your medicine as you normally would or as directed.  PROCEDURE   After giving medicine to numb the skin, your health care provider will make a cut to reopen the pocket holding the pacemaker.   The old pacemaker will be disconnected from its leads.   The leads will be tested.   If needed, the leads will be replaced. If the leads are functioning properly, the new pacemaker may be connected to the existing leads.  A heart monitor and the pacemaker programmer will be used to make sure that the new pacemaker is working properly. The incision site is then closed. A dressing is placed over the pacemaker site. The dressing is removed 24 hours afterwards. Keep incision clean and dry for one week.    Restrict moving device side arm.  AFTER THE PROCEDURE   You will be taken to a recovery area after the new pacemaker implant is completed. Your vital signs such as blood pressure, heart rate, breathing, and oxygen levels will be monitored.  Your health  care provider will tell you when you will need to next test your pacemaker or when to return to the office for follow-up for removal of stitches. Document Released: 01/28/2007 Document Revised: 06/22/2013 Document Reviewed: 05/04/2013 [facility] Patient Information 2014 [place].

## 2013-11-25 NOTE — H&P (Signed)
       Patient Care Team: Ernestene Kiel as PCP - General (Internal Medicine) Thyra Breed, MD as Consulting Physician (Rheumatology)   HPI  Connor Wilkerson is a 73 y.o. male Seen in followup for ICD implanted for primary prevention. His device has reached ERI.  He is here for ICD generator replacement  He desires Sub Pec implant     He is a history of coronary disease with prior bypass. Recent ejection fraction was 45%.  He is here to discuss generator replacement in tlight of the above  He has modest symptoms of dyspnea but considerable fatigue   Past Medical History  Diagnosis Date  . Dyspnea   . Ischemic cardiomyopathy   . ICD (implantable cardiac defibrillator)  BSx     Past Surgical History  Procedure Laterality Date  . Cardiac defibrillator placement    . Coronary artery bypass graft    . Hernia repair      x2    Current Facility-Administered Medications  Medication Dose Route Frequency Provider Last Rate Last Dose  . 0.9 %  sodium chloride infusion   Intravenous Continuous Brooke O Edmisten, PA-C      . 0.9 %  sodium chloride infusion   Intravenous Continuous Deboraha Sprang, MD 50 mL/hr at 11/25/13 0813    . ceFAZolin (ANCEF) IVPB 2 g/50 mL premix  2 g Intravenous On Call [employer], PA-C      . chlorhexidine (HIBICLENS) 4 % liquid 4 application  60 mL Topical Once Deboraha Sprang, MD      . chlorhexidine (HIBICLENS) 4 % liquid 4 application  60 mL Topical Once [employer], PA-C      . gentamicin (GARAMYCIN) 80 mg in sodium chloride irrigation 0.9 % 500 mL irrigation  80 mg Irrigation On Call [employer], PA-C      . mupirocin ointment (BACTROBAN) 2 %   Nasal BID Deboraha Sprang, MD        Allergies  Allergen Reactions  . Tramadol Other (See Comments)    Unknown     Review of Systems negative except from HPI and PMH  Physical Exam BP 153/85  Pulse 60  Temp(Src) 97.9 F (36.6 C) (Oral)  Resp 18  Ht 5\' 11"  (1.803 m)   Wt 165 lb (74.844 kg)  BMI 23.02 kg/m2  SpO2 99% Well developed and nourished in no acute distress HENT normal Neck supple with JVP-flat Clea. Device pocket well healed; without hematoma or erythema.  There is no tethering  Regular rate and rhythm, no murmurs or gallops Abd-soft with active BS No Clubbing cyanosis edema Skin-warm and dry A & Oriented  Grossly normal sensory and motor function     Assessment and  Plan  Ischemic cardiomyopathy  ICD  For ICD generator replacement with movement to subPec space

## 2013-11-25 NOTE — Progress Notes (Signed)
Patient sent over to cath lab with BMET still in process.  Dr. Caryl Comes is aware and still wanted to send patient.

## 2013-11-30 ENCOUNTER — Other Ambulatory Visit: Payer: Self-pay | Admitting: *Deleted

## 2013-11-30 DIAGNOSIS — R0600 Dyspnea, unspecified: Secondary | ICD-10-CM

## 2013-12-05 ENCOUNTER — Ambulatory Visit (INDEPENDENT_AMBULATORY_CARE_PROVIDER_SITE_OTHER): Payer: Medicare Other | Admitting: *Deleted

## 2013-12-05 DIAGNOSIS — I2589 Other forms of chronic ischemic heart disease: Secondary | ICD-10-CM | POA: Diagnosis not present

## 2013-12-05 LAB — MDC_IDC_ENUM_SESS_TYPE_INCLINIC
Brady Statistic RA Percent Paced: 57 %
Brady Statistic RV Percent Paced: 1 % — CL
HighPow Impedance: 45 Ohm
Lead Channel Impedance Value: 622 Ohm
Lead Channel Pacing Threshold Amplitude: 0.7 V
Lead Channel Pacing Threshold Amplitude: 1 V
Lead Channel Pacing Threshold Pulse Width: 0.4 ms
Lead Channel Pacing Threshold Pulse Width: 0.4 ms
Lead Channel Sensing Intrinsic Amplitude: 1.7 mV
Lead Channel Sensing Intrinsic Amplitude: 12.6 mV
Lead Channel Setting Pacing Amplitude: 2 V
Lead Channel Setting Pacing Amplitude: 2.5 V
Lead Channel Setting Pacing Pulse Width: 0.4 ms
MDC IDC MSMT LEADCHNL RA IMPEDANCE VALUE: 400 Ohm
MDC IDC PG SERIAL: 112776
MDC IDC SESS DTM: 20150202050000
MDC IDC SET LEADCHNL RV SENSING SENSITIVITY: 0.6 mV
MDC IDC SET ZONE DETECTION INTERVAL: 333 ms
Zone Setting Detection Interval: 250 ms
Zone Setting Detection Interval: 286 ms

## 2013-12-07 ENCOUNTER — Ambulatory Visit (HOSPITAL_BASED_OUTPATIENT_CLINIC_OR_DEPARTMENT_OTHER): Payer: Medicare Other | Attending: Internal Medicine | Admitting: Radiology

## 2013-12-07 ENCOUNTER — Encounter (HOSPITAL_BASED_OUTPATIENT_CLINIC_OR_DEPARTMENT_OTHER): Payer: Medicare Other

## 2013-12-07 VITALS — Ht 71.0 in | Wt 172.0 lb

## 2013-12-07 DIAGNOSIS — G4737 Central sleep apnea in conditions classified elsewhere: Secondary | ICD-10-CM | POA: Diagnosis not present

## 2013-12-07 DIAGNOSIS — G4710 Hypersomnia, unspecified: Secondary | ICD-10-CM

## 2013-12-07 DIAGNOSIS — G4733 Obstructive sleep apnea (adult) (pediatric): Secondary | ICD-10-CM | POA: Insufficient documentation

## 2013-12-07 DIAGNOSIS — G4761 Periodic limb movement disorder: Secondary | ICD-10-CM | POA: Insufficient documentation

## 2013-12-07 DIAGNOSIS — I4949 Other premature depolarization: Secondary | ICD-10-CM | POA: Insufficient documentation

## 2013-12-07 NOTE — Progress Notes (Signed)
Wound check appointment. Steri-strips removed. Wound without redness or edema. Incision edges approximated, wound well healed. Normal device function. Thresholds, sensing, and impedances consistent with implant measurements. Device programmed at appropriate safety margins. Histogram distribution appropriate for patient and level of activity. No mode switches or ventricular arrhythmias noted. Patient educated about wound care, arm mobility, lifting restrictions, shock plan. ROV in 3 months with SK. 

## 2013-12-15 ENCOUNTER — Ambulatory Visit (INDEPENDENT_AMBULATORY_CARE_PROVIDER_SITE_OTHER): Payer: Medicare Other | Admitting: Internal Medicine

## 2013-12-15 ENCOUNTER — Encounter: Payer: Self-pay | Admitting: Internal Medicine

## 2013-12-15 VITALS — BP 117/82 | HR 59 | Ht 70.0 in | Wt 163.8 lb

## 2013-12-15 DIAGNOSIS — R296 Repeated falls: Secondary | ICD-10-CM

## 2013-12-15 DIAGNOSIS — R29818 Other symptoms and signs involving the nervous system: Secondary | ICD-10-CM

## 2013-12-15 DIAGNOSIS — G4733 Obstructive sleep apnea (adult) (pediatric): Secondary | ICD-10-CM | POA: Diagnosis not present

## 2013-12-15 DIAGNOSIS — I255 Ischemic cardiomyopathy: Secondary | ICD-10-CM

## 2013-12-15 DIAGNOSIS — I2589 Other forms of chronic ischemic heart disease: Secondary | ICD-10-CM | POA: Diagnosis not present

## 2013-12-15 DIAGNOSIS — Z95810 Presence of automatic (implantable) cardiac defibrillator: Secondary | ICD-10-CM | POA: Diagnosis not present

## 2013-12-15 DIAGNOSIS — G4710 Hypersomnia, unspecified: Secondary | ICD-10-CM | POA: Diagnosis not present

## 2013-12-15 LAB — MDC_IDC_ENUM_SESS_TYPE_INCLINIC
Brady Statistic RA Percent Paced: 57 %
Brady Statistic RV Percent Paced: 1 %
Date Time Interrogation Session: 20150212050000
HighPow Impedance: 48 Ohm
Implantable Pulse Generator Serial Number: 112776
Lead Channel Impedance Value: 423 Ohm
Lead Channel Impedance Value: 681 Ohm
Lead Channel Pacing Threshold Amplitude: 1 V
Lead Channel Pacing Threshold Pulse Width: 0.4 ms
Lead Channel Sensing Intrinsic Amplitude: 2.4 mV
Lead Channel Setting Pacing Amplitude: 2.4 V
Lead Channel Setting Pacing Pulse Width: 0.4 ms
Lead Channel Setting Sensing Sensitivity: 0.6 mV
MDC IDC MSMT LEADCHNL RA PACING THRESHOLD AMPLITUDE: 0.7 V
MDC IDC MSMT LEADCHNL RA PACING THRESHOLD PULSEWIDTH: 0.4 ms
MDC IDC MSMT LEADCHNL RV SENSING INTR AMPL: 9.5 mV
MDC IDC SET LEADCHNL RA PACING AMPLITUDE: 2 V
MDC IDC SET ZONE DETECTION INTERVAL: 250 ms
Zone Setting Detection Interval: 286 ms
Zone Setting Detection Interval: 333 ms

## 2013-12-15 MED ORDER — QUINAPRIL HCL 40 MG PO TABS: 40.0000 mg | ORAL_TABLET | Freq: Every day | ORAL | Status: DC

## 2013-12-15 MED ORDER — ISOSORBIDE MONONITRATE ER 60 MG PO TB24: 60.0000 mg | ORAL_TABLET | Freq: Every day | ORAL | Status: DC

## 2013-12-15 NOTE — Assessment & Plan Note (Signed)
Continue current meds 

## 2013-12-15 NOTE — Patient Instructions (Signed)
Your physician has recommended you make the following change in your medication:  1) Stop Amlodipine 2) Decrease Isosorbide mononitrate to 60 mg once daily at bedtime 3) Increase Quinapril to 40 mg daily at bedtime.   Your physician recommends that you schedule a follow-up appointment in: 3 months with Dr. Caryl Comes.

## 2013-12-15 NOTE — Sleep Study (Signed)
   NAME: Connor Wilkerson DATE OF BIRTH:  [birth year] MEDICAL RECORD NUMBER [id number]  LOCATION: [facility]  PHYSICIAN: Kathee Delton  DATE OF STUDY: 12/07/2013  SLEEP STUDY TYPE: Nocturnal Polysomnogram               REFERRING PHYSICIAN: Deboraha Sprang, MD  INDICATION FOR STUDY: Hypersomnia with sleep apnea  EPWORTH SLEEPINESS SCORE:  12 HEIGHT: 5\' 11"  (180.3 cm)  WEIGHT: 172 lb (78.019 kg)    Body mass index is 24 kg/(m^2).  NECK SIZE: 15 in.  MEDICATIONS: Reviewed in sleep chart  SLEEP ARCHITECTURE: The patient had a total sleep time of 237 minutes with no slow-wave sleep and only 9 minutes of REM. Sleep onset latency was normal at 8 minutes, and REM onset was prolonged at 212 minutes. Sleep efficiency was poor at 67%.  RESPIRATORY DATA: The patient was found to have 13 apneas that were primarily central in origin, as well as 12 obstructive hypopneas. This gave him an AHI of only 6 events per hour. The events occurred primarily in the supine position, and there was moderate snoring noted throughout. The patient did not meet split-night criteria secondary to the small numbers of events.  OXYGEN DATA: There was oxygen desaturation transiently as low as 86%. The patient spent only 36 seconds less than 88% the entire night.  CARDIAC DATA: The patient was noted to have a paced rhythm with occasional PVC.  MOVEMENT/PARASOMNIA: 298 periodic limb movements were noted, with 11 per hour resulting in arousal or awakening. No abnormal behaviors were seen.  IMPRESSION/ RECOMMENDATION:    1)  minimal obstructive and central sleep apnea, with an AHI of 6 events per hour and oxygen desaturation transiently as low as 86%. The patient did have a short total sleep time and very little REM, and therefore his degree of sleep apnea may be underestimated. Clinical correlation is suggested. Treatment for this degree of sleep apnea can include modest weight loss if applicable, upper  airway surgery, dental appliance, and also CPAP. This degree of sleep apnea probably does not represent a significant increase cardiovascular risk.  2) paced rhythm with occasional PVC noted  3) large numbers of periodic limb movements with significant sleep disruption. Clinical correlation is suggested to see if the patient may have a history compatible with the restless leg syndrome. If so, the patient may respond to a trial of a dopamine agonist.      [place], American Board of Sleep Medicine  ELECTRONICALLY SIGNED ON:  12/15/2013, 2:19 PM [place] PH: [phone]   FX: [phone] [place]

## 2013-12-15 NOTE — Progress Notes (Signed)
Patient Care Team: Ernestene Kiel as PCP - General (Internal Medicine) Thyra Breed, MD as Consulting Physician (Rheumatology)   HPI  Connor Wilkerson is a 73 y.o. male Seen following ICD generator replacement for ischemic cardiomyopathy. Without with Dr. Lavone Nian removed the device pocket subpectorally.  He is exceedingly pleased with the cosmetic results of the ICD positioning.  However, he recounts a series of falls. These all occur shortly after having been standing. He is noted to be quite pale afterwards. He is on the ground for a couple of minutes. He denies orthostatic residual intolerance.  He denies shower intolerance or lightheadedness upon brisk standing.  There are no new medications. This problem has been long-standing but has been increasing in frequency of late. Past Medical History  Diagnosis Date  . Dyspnea   . Ischemic cardiomyopathy   . ICD (implantable cardiac defibrillator)  BSx     Past Surgical History  Procedure Laterality Date  . Cardiac defibrillator placement    . Coronary artery bypass graft    . Hernia repair      x2    Current Outpatient Prescriptions  Medication Sig Dispense Refill  . amLODipine (NORVASC) 5 MG tablet Take 5 mg by mouth daily.        Marland Kitchen aspirin 81 MG tablet Take 81 mg by mouth 2 (two) times daily.       Marland Kitchen buPROPion (WELLBUTRIN XL) 300 MG 24 hr tablet Take 300 mg by mouth daily.        . carvedilol (COREG) 25 MG tablet Take 1 tablet (25 mg total) by mouth 2 (two) times daily with a meal.  60 tablet  12  . Cyanocobalamin (VITAMIN B-12) 6000 MCG SUBL Place 6,000 mcg under the tongue daily.      Marland Kitchen escitalopram (LEXAPRO) 10 MG tablet Take 10 mg by mouth at bedtime.       . famotidine (PEPCID) 20 MG tablet Take 20 mg by mouth at bedtime.       . gabapentin (NEURONTIN) 300 MG capsule Take 300 mg by mouth 2 (two) times daily as needed (leg/shoulder pain).       . isosorbide mononitrate (IMDUR) 60 MG 24 hr tablet Take 60  mg by mouth 2 (two) times daily.        . nitroGLYCERIN (NITROSTAT) 0.4 MG SL tablet Place 0.4 mg under the tongue every 5 (five) minutes as needed for chest pain.      Marland Kitchen oxyCODONE-acetaminophen (PERCOCET) 10-325 MG per tablet Take 1 tablet by mouth every 4 (four) hours as needed for pain.      . pantoprazole (PROTONIX) 40 MG tablet Take 40 mg by mouth 2 (two) times daily.       Marland Kitchen PRESCRIPTION MEDICATION Apply 1 application topically 2 (two) times daily as needed (for shoulder pain). COMPOUNDED CREAM: ketoprofen 20%, baclofen 20%, cyclobenzaprine 20%, gabapentin 6%, and lidocaine 2.5%      . quinapril (ACCUPRIL) 20 MG tablet Take 20 mg by mouth 2 (two) times daily.       . rosuvastatin (CRESTOR) 20 MG tablet Take 20 mg by mouth daily.        . traZODone (DESYREL) 50 MG tablet Take 50 mg by mouth at bedtime as needed for sleep.      . valsartan (DIOVAN) 320 MG tablet Take 320 mg by mouth daily.         No current facility-administered medications for this visit.    Allergies  Allergen Reactions  . Tramadol Other (See Comments)    Unknown     Review of Systems negative except from HPI and PMH  Physical Exam BP 131/77  Pulse 60  Ht 5\' 10"  (1.778 m)  Wt 163 lb 12.8 oz (74.299 kg)  BMI 23.50 kg/m2 Well developed and well nourished in no acute distress HENT normal E scleral and icterus clear Neck Supple JVP flat; carotids brisk and full Clear to ausculation Device pocket well healed; without hematoma or erythema.  There is no tetheringRegular rate and rhythm, no murmurs gallops or rub Soft with active bowel sounds No clubbing cyanosis none Edema Alert and oriented, grossly normal motor and sensory function Skin Warm and Dry    Assessment and  Plan

## 2013-12-15 NOTE — Assessment & Plan Note (Signed)
The patient's device was interrogated.  The information was reviewed. No changes were made in the programming.    

## 2013-12-15 NOTE — Assessment & Plan Note (Signed)
Sleep study pending 

## 2013-12-15 NOTE — Assessment & Plan Note (Addendum)
The patient has had multiple falls. They occur with little warning  he had been increasingly frequent. His device is detected no abnormalities.   Blood pressure demonstrating a 40 mm drop upon standing. We'll undertake a significant reduction in his medications.    We will stop the amlodipine. He'll take his quinapril at night and his isosorbide will be reduced to 60 mg once a day taken at night. I suggested the use of support stockings. We'll hold this option in abeyance

## 2013-12-19 DIAGNOSIS — L570 Actinic keratosis: Secondary | ICD-10-CM | POA: Diagnosis not present

## 2013-12-23 ENCOUNTER — Encounter: Payer: Self-pay | Admitting: Internal Medicine

## 2014-01-03 ENCOUNTER — Encounter: Payer: Self-pay | Admitting: Internal Medicine

## 2014-01-10 DIAGNOSIS — F329 Major depressive disorder, single episode, unspecified: Secondary | ICD-10-CM | POA: Diagnosis not present

## 2014-01-10 DIAGNOSIS — G4700 Insomnia, unspecified: Secondary | ICD-10-CM | POA: Diagnosis not present

## 2014-01-10 DIAGNOSIS — E785 Hyperlipidemia, unspecified: Secondary | ICD-10-CM | POA: Diagnosis not present

## 2014-01-10 DIAGNOSIS — M25519 Pain in unspecified shoulder: Secondary | ICD-10-CM | POA: Diagnosis not present

## 2014-01-10 DIAGNOSIS — I10 Essential (primary) hypertension: Secondary | ICD-10-CM | POA: Diagnosis not present

## 2014-01-10 DIAGNOSIS — F3289 Other specified depressive episodes: Secondary | ICD-10-CM | POA: Diagnosis not present

## 2014-01-10 DIAGNOSIS — I2510 Atherosclerotic heart disease of native coronary artery without angina pectoris: Secondary | ICD-10-CM | POA: Diagnosis not present

## 2014-01-23 DIAGNOSIS — I2510 Atherosclerotic heart disease of native coronary artery without angina pectoris: Secondary | ICD-10-CM | POA: Diagnosis not present

## 2014-01-23 DIAGNOSIS — I509 Heart failure, unspecified: Secondary | ICD-10-CM | POA: Diagnosis not present

## 2014-01-23 DIAGNOSIS — I10 Essential (primary) hypertension: Secondary | ICD-10-CM | POA: Diagnosis not present

## 2014-01-23 DIAGNOSIS — E7800 Pure hypercholesterolemia, unspecified: Secondary | ICD-10-CM | POA: Diagnosis not present

## 2014-01-23 DIAGNOSIS — I2589 Other forms of chronic ischemic heart disease: Secondary | ICD-10-CM | POA: Diagnosis not present

## 2014-02-14 ENCOUNTER — Telehealth: Payer: Self-pay | Admitting: Internal Medicine

## 2014-02-14 NOTE — Telephone Encounter (Signed)
New problem   Pt want to know results of sleep study test. Please call pt.

## 2014-02-15 NOTE — Telephone Encounter (Signed)
Left message on answering machine to return our call

## 2014-02-15 NOTE — Telephone Encounter (Signed)
Follow up ° ° ° ° ° ° ° ° ° °Pt returning nurses call °

## 2014-02-15 NOTE — Telephone Encounter (Signed)
Explained I would have Dr. Caryl Comes review [place]. She is agreeable to this.   She also would like to relay to Dr. Caryl Comes that he missed the [facility]) playing this week!!

## 2014-03-02 NOTE — Telephone Encounter (Signed)
Informed Olin Hauser (emergency contact) sleep study results essentially normal. She verbalized understanding.

## 2014-03-03 DIAGNOSIS — H35039 Hypertensive retinopathy, unspecified eye: Secondary | ICD-10-CM | POA: Diagnosis not present

## 2014-03-03 DIAGNOSIS — H25019 Cortical age-related cataract, unspecified eye: Secondary | ICD-10-CM | POA: Diagnosis not present

## 2014-03-03 DIAGNOSIS — H524 Presbyopia: Secondary | ICD-10-CM | POA: Diagnosis not present

## 2014-03-03 DIAGNOSIS — H2510 Age-related nuclear cataract, unspecified eye: Secondary | ICD-10-CM | POA: Diagnosis not present

## 2014-03-06 DIAGNOSIS — R5383 Other fatigue: Secondary | ICD-10-CM | POA: Diagnosis not present

## 2014-03-06 DIAGNOSIS — R51 Headache: Secondary | ICD-10-CM | POA: Diagnosis not present

## 2014-03-06 DIAGNOSIS — R5381 Other malaise: Secondary | ICD-10-CM | POA: Diagnosis not present

## 2014-03-06 DIAGNOSIS — R42 Dizziness and giddiness: Secondary | ICD-10-CM | POA: Diagnosis not present

## 2014-03-06 DIAGNOSIS — I252 Old myocardial infarction: Secondary | ICD-10-CM | POA: Diagnosis not present

## 2014-03-06 DIAGNOSIS — F0781 Postconcussional syndrome: Secondary | ICD-10-CM | POA: Diagnosis not present

## 2014-03-06 DIAGNOSIS — I509 Heart failure, unspecified: Secondary | ICD-10-CM | POA: Diagnosis not present

## 2014-03-06 DIAGNOSIS — S0993XA Unspecified injury of face, initial encounter: Secondary | ICD-10-CM | POA: Diagnosis not present

## 2014-03-06 DIAGNOSIS — S199XXA Unspecified injury of neck, initial encounter: Secondary | ICD-10-CM | POA: Diagnosis not present

## 2014-03-06 DIAGNOSIS — S0990XA Unspecified injury of head, initial encounter: Secondary | ICD-10-CM | POA: Diagnosis not present

## 2014-03-07 ENCOUNTER — Encounter: Payer: Medicare Other | Admitting: Internal Medicine

## 2014-03-16 DIAGNOSIS — J309 Allergic rhinitis, unspecified: Secondary | ICD-10-CM | POA: Diagnosis not present

## 2014-03-16 DIAGNOSIS — H6980 Other specified disorders of Eustachian tube, unspecified ear: Secondary | ICD-10-CM | POA: Diagnosis not present

## 2014-03-16 DIAGNOSIS — M25519 Pain in unspecified shoulder: Secondary | ICD-10-CM | POA: Diagnosis not present

## 2014-03-29 ENCOUNTER — Ambulatory Visit (INDEPENDENT_AMBULATORY_CARE_PROVIDER_SITE_OTHER): Payer: Medicare Other | Admitting: Internal Medicine

## 2014-03-29 ENCOUNTER — Encounter: Payer: Self-pay | Admitting: Internal Medicine

## 2014-03-29 VITALS — BP 150/96 | HR 51 | Ht 71.0 in | Wt 167.0 lb

## 2014-03-29 DIAGNOSIS — Z95810 Presence of automatic (implantable) cardiac defibrillator: Secondary | ICD-10-CM

## 2014-03-29 DIAGNOSIS — J209 Acute bronchitis, unspecified: Secondary | ICD-10-CM | POA: Diagnosis not present

## 2014-03-29 DIAGNOSIS — I2589 Other forms of chronic ischemic heart disease: Secondary | ICD-10-CM | POA: Diagnosis not present

## 2014-03-29 DIAGNOSIS — R29818 Other symptoms and signs involving the nervous system: Secondary | ICD-10-CM | POA: Diagnosis not present

## 2014-03-29 DIAGNOSIS — I255 Ischemic cardiomyopathy: Secondary | ICD-10-CM

## 2014-03-29 DIAGNOSIS — R296 Repeated falls: Secondary | ICD-10-CM

## 2014-03-29 LAB — MDC_IDC_ENUM_SESS_TYPE_INCLINIC
Battery Remaining Longevity: 126 mo
Battery Remaining Percentage: 100 %
Brady Statistic RA Percent Paced: 58 %
Brady Statistic RV Percent Paced: 1 %
Date Time Interrogation Session: 20150527135652
HighPow Impedance: 51 Ohm
Lead Channel Pacing Threshold Amplitude: 0.8 V
Lead Channel Pacing Threshold Pulse Width: 0.4 ms
Lead Channel Setting Pacing Amplitude: 2.4 V
Lead Channel Setting Pacing Pulse Width: 0.4 ms
MDC IDC MSMT LEADCHNL RA IMPEDANCE VALUE: 407 Ohm
MDC IDC MSMT LEADCHNL RA PACING THRESHOLD PULSEWIDTH: 0.4 ms
MDC IDC MSMT LEADCHNL RV IMPEDANCE VALUE: 573 Ohm
MDC IDC MSMT LEADCHNL RV PACING THRESHOLD AMPLITUDE: 0.8 V
MDC IDC PG SERIAL: 112776
MDC IDC SET LEADCHNL RA PACING AMPLITUDE: 2 V
MDC IDC SET LEADCHNL RV SENSING SENSITIVITY: 0.6 mV
MDC IDC SET ZONE DETECTION INTERVAL: 250 ms
Zone Setting Detection Interval: 286 ms
Zone Setting Detection Interval: 333 ms

## 2014-03-29 NOTE — Progress Notes (Signed)
Patient Care Team: Ernestene Kiel, MD as PCP - General (Internal Medicine) Thyra Breed, MD as Consulting Physician (Rheumatology)   HPI  Connor Wilkerson is a 73 y.o. male  Seen following ICD generator replacement for ischemic cardiomyopathy subsequetly  the device pocket moved to subpectorally.  He is exceedingly pleased with the cosmetic results of the ICD positioning.   His blood pressure is quite elevated.    Past Medical History     Past Medical History  Diagnosis Date  . Dyspnea   . Ischemic cardiomyopathy   . ICD (implantable cardiac defibrillator)  BSx     Past Surgical History  Procedure Laterality Date  . Cardiac defibrillator placement    . Coronary artery bypass graft    . Hernia repair      x2    Current Outpatient Prescriptions  Medication Sig Dispense Refill  . aspirin 81 MG tablet Take 81 mg by mouth 2 (two) times daily.       Marland Kitchen buPROPion (WELLBUTRIN XL) 300 MG 24 hr tablet Take 300 mg by mouth daily.        . carvedilol (COREG) 25 MG tablet Take 1 tablet (25 mg total) by mouth 2 (two) times daily with a meal.  60 tablet  12  . Cyanocobalamin (VITAMIN B-12) 6000 MCG SUBL Place 6,000 mcg under the tongue daily.      Marland Kitchen escitalopram (LEXAPRO) 10 MG tablet Take 10 mg by mouth at bedtime.       . famotidine (PEPCID) 20 MG tablet Take 20 mg by mouth at bedtime.       . gabapentin (NEURONTIN) 300 MG capsule Take 300 mg by mouth 2 (two) times daily as needed (leg/shoulder pain).       . isosorbide mononitrate (IMDUR) 60 MG 24 hr tablet Take 1 tablet (60 mg total) by mouth daily. At bedtime  30 tablet  3  . nitroGLYCERIN (NITROSTAT) 0.4 MG SL tablet Place 0.4 mg under the tongue every 5 (five) minutes as needed for chest pain.      Marland Kitchen oxyCODONE-acetaminophen (PERCOCET) 10-325 MG per tablet Take 1 tablet by mouth every 4 (four) hours as needed for pain.      . pantoprazole (PROTONIX) 40 MG tablet Take 40 mg by mouth 2 (two) times daily.         Marland Kitchen PRESCRIPTION MEDICATION Apply 1 application topically 2 (two) times daily as needed (for shoulder pain). COMPOUNDED CREAM: ketoprofen 20%, baclofen 20%, cyclobenzaprine 20%, gabapentin 6%, and lidocaine 2.5%      . quinapril (ACCUPRIL) 40 MG tablet Take 1 tablet (40 mg total) by mouth daily. At bedtime  30 tablet  3  . rosuvastatin (CRESTOR) 20 MG tablet Take 20 mg by mouth daily.        . traZODone (DESYREL) 50 MG tablet Take 50 mg by mouth at bedtime as needed for sleep.      . valsartan (DIOVAN) 320 MG tablet Take 320 mg by mouth daily.         No current facility-administered medications for this visit.    Allergies  Allergen Reactions  . Tramadol Other (See Comments)    Unknown     Review of Systems negative except from HPI and PMH  Physical Exam BP 150/96  Pulse 51  Ht 5\' 11"  (1.803 m)  Wt 167 lb (75.751 kg)  BMI 23.30 kg/m2  SpO2 94% Well developed and nourished in no acute distress HENT normal Neck  supple with JVP-flat Clear Regular rate and rhythm, no murmurs or gallops Abd-soft with active BS No Clubbing cyanosis edema Skin-warm and dry A & Oriented  Grossly normal sensory and motor function    Assessment and  Plan\  Ischemic cardiac myopathy  Implantable defibrillator  Hypertension-severe He is euvolemic. His blood pressures are very elevated. I suggested that we consider the use of Aldactone as opposed to the ARB. Studies have shown it cannot Aldactone to his plus and ARB. Will defer this final decision to Dr. Laqueta Due and Dr. Ophelia Charter. We'll see him in one years time

## 2014-03-29 NOTE — Patient Instructions (Signed)
Your physician recommends that you continue on your current medications as directed. Please refer to the Current Medication list given to you today.  Remote monitoring is used to monitor your Pacemaker of ICD from home. This monitoring reduces the number of office visits required to check your device to one time per year. It allows us to keep an eye on the functioning of your device to ensure it is working properly. You are scheduled for a device check from home on 06/12/14. You may send your transmission at any time that day. If you have a wireless device, the transmission will be sent automatically. After your physician reviews your transmission, you will receive a postcard with your next transmission date.  Your physician wants you to follow-up in: 1 year with Dr. Klein.  You will receive a reminder letter in the mail two months in advance. If you don't receive a letter, please call our office to schedule the follow-up appointment.  

## 2014-04-06 DIAGNOSIS — M47817 Spondylosis without myelopathy or radiculopathy, lumbosacral region: Secondary | ICD-10-CM | POA: Diagnosis not present

## 2014-04-06 DIAGNOSIS — M4120 Other idiopathic scoliosis, site unspecified: Secondary | ICD-10-CM | POA: Diagnosis not present

## 2014-04-06 DIAGNOSIS — M549 Dorsalgia, unspecified: Secondary | ICD-10-CM | POA: Diagnosis not present

## 2014-04-09 ENCOUNTER — Other Ambulatory Visit: Payer: Self-pay | Admitting: Internal Medicine

## 2014-04-11 ENCOUNTER — Other Ambulatory Visit: Payer: Self-pay | Admitting: Internal Medicine

## 2014-04-13 DIAGNOSIS — M549 Dorsalgia, unspecified: Secondary | ICD-10-CM | POA: Diagnosis not present

## 2014-04-13 DIAGNOSIS — R490 Dysphonia: Secondary | ICD-10-CM | POA: Diagnosis not present

## 2014-04-13 DIAGNOSIS — I2510 Atherosclerotic heart disease of native coronary artery without angina pectoris: Secondary | ICD-10-CM | POA: Diagnosis not present

## 2014-04-13 DIAGNOSIS — R42 Dizziness and giddiness: Secondary | ICD-10-CM | POA: Diagnosis not present

## 2014-04-13 DIAGNOSIS — I10 Essential (primary) hypertension: Secondary | ICD-10-CM | POA: Diagnosis not present

## 2014-04-24 ENCOUNTER — Other Ambulatory Visit (HOSPITAL_COMMUNITY): Payer: Self-pay | Admitting: Cardiology

## 2014-04-24 DIAGNOSIS — R55 Syncope and collapse: Secondary | ICD-10-CM | POA: Diagnosis not present

## 2014-04-24 DIAGNOSIS — I2510 Atherosclerotic heart disease of native coronary artery without angina pectoris: Secondary | ICD-10-CM | POA: Diagnosis not present

## 2014-04-24 DIAGNOSIS — I252 Old myocardial infarction: Secondary | ICD-10-CM | POA: Diagnosis not present

## 2014-04-24 DIAGNOSIS — N189 Chronic kidney disease, unspecified: Secondary | ICD-10-CM | POA: Diagnosis not present

## 2014-04-24 DIAGNOSIS — R7309 Other abnormal glucose: Secondary | ICD-10-CM | POA: Diagnosis not present

## 2014-04-24 DIAGNOSIS — I2589 Other forms of chronic ischemic heart disease: Secondary | ICD-10-CM | POA: Diagnosis not present

## 2014-04-24 DIAGNOSIS — R0989 Other specified symptoms and signs involving the circulatory and respiratory systems: Secondary | ICD-10-CM | POA: Diagnosis not present

## 2014-04-24 DIAGNOSIS — R0609 Other forms of dyspnea: Secondary | ICD-10-CM | POA: Diagnosis not present

## 2014-04-24 DIAGNOSIS — R079 Chest pain, unspecified: Secondary | ICD-10-CM

## 2014-04-24 DIAGNOSIS — I509 Heart failure, unspecified: Secondary | ICD-10-CM | POA: Diagnosis not present

## 2014-05-01 ENCOUNTER — Other Ambulatory Visit: Payer: Self-pay

## 2014-05-01 ENCOUNTER — Encounter (INDEPENDENT_AMBULATORY_CARE_PROVIDER_SITE_OTHER): Payer: Self-pay

## 2014-05-01 ENCOUNTER — Ambulatory Visit (HOSPITAL_COMMUNITY)
Admission: RE | Admit: 2014-05-01 | Discharge: 2014-05-01 | Disposition: A | Discharge status: Home / Self Care | Payer: Medicare Other | Source: Ambulatory Visit | Attending: Cardiology | Admitting: Cardiology

## 2014-05-01 ENCOUNTER — Encounter (HOSPITAL_COMMUNITY)
Admission: RE | Admit: 2014-05-01 | Discharge: 2014-05-01 | Disposition: A | Discharge status: Home / Self Care | Payer: Medicare Other | Source: Ambulatory Visit | Attending: Cardiology | Admitting: Cardiology

## 2014-05-01 DIAGNOSIS — R0609 Other forms of dyspnea: Secondary | ICD-10-CM | POA: Diagnosis not present

## 2014-05-01 DIAGNOSIS — R079 Chest pain, unspecified: Secondary | ICD-10-CM | POA: Diagnosis not present

## 2014-05-01 DIAGNOSIS — R0989 Other specified symptoms and signs involving the circulatory and respiratory systems: Secondary | ICD-10-CM | POA: Diagnosis not present

## 2014-05-01 MED ORDER — TECHNETIUM TC 99M SESTAMIBI GENERIC - CARDIOLITE
30.0000 | Freq: Once | INTRAVENOUS | Status: AC | PRN
  Administered 2014-05-01: 30 via INTRAVENOUS

## 2014-05-01 MED ORDER — REGADENOSON 0.4 MG/5ML IV SOLN
INTRAVENOUS | Status: AC
  Filled 2014-05-01: qty 5

## 2014-05-01 MED ORDER — TECHNETIUM TC 99M SESTAMIBI GENERIC - CARDIOLITE
10.0000 | Freq: Once | INTRAVENOUS | Status: AC | PRN
  Administered 2014-05-01: 10 via INTRAVENOUS

## 2014-05-01 MED ORDER — REGADENOSON 0.4 MG/5ML IV SOLN
0.4000 mg | Freq: Once | INTRAVENOUS | Status: AC
  Administered 2014-05-01: 0.4 mg via INTRAVENOUS

## 2014-05-03 DIAGNOSIS — L570 Actinic keratosis: Secondary | ICD-10-CM | POA: Diagnosis not present

## 2014-05-03 DIAGNOSIS — L578 Other skin changes due to chronic exposure to nonionizing radiation: Secondary | ICD-10-CM | POA: Diagnosis not present

## 2014-05-03 DIAGNOSIS — R233 Spontaneous ecchymoses: Secondary | ICD-10-CM | POA: Diagnosis not present

## 2014-05-13 DIAGNOSIS — M109 Gout, unspecified: Secondary | ICD-10-CM | POA: Diagnosis not present

## 2014-05-13 DIAGNOSIS — M25549 Pain in joints of unspecified hand: Secondary | ICD-10-CM | POA: Diagnosis not present

## 2014-05-22 DIAGNOSIS — IMO0002 Reserved for concepts with insufficient information to code with codable children: Secondary | ICD-10-CM | POA: Diagnosis not present

## 2014-05-22 DIAGNOSIS — M19049 Primary osteoarthritis, unspecified hand: Secondary | ICD-10-CM | POA: Diagnosis not present

## 2014-05-22 DIAGNOSIS — M25549 Pain in joints of unspecified hand: Secondary | ICD-10-CM | POA: Diagnosis not present

## 2014-06-05 DIAGNOSIS — G5620 Lesion of ulnar nerve, unspecified upper limb: Secondary | ICD-10-CM | POA: Diagnosis not present

## 2014-06-08 DIAGNOSIS — M19049 Primary osteoarthritis, unspecified hand: Secondary | ICD-10-CM | POA: Diagnosis not present

## 2014-06-22 DIAGNOSIS — M19049 Primary osteoarthritis, unspecified hand: Secondary | ICD-10-CM | POA: Diagnosis not present

## 2014-06-28 ENCOUNTER — Encounter: Payer: Self-pay | Admitting: Internal Medicine

## 2014-07-04 ENCOUNTER — Encounter: Payer: Medicare Other | Admitting: *Deleted

## 2014-07-04 ENCOUNTER — Telehealth: Payer: Self-pay | Admitting: Cardiology

## 2014-07-04 NOTE — Telephone Encounter (Signed)
Spoke with pt and reminded pt of remote transmission that is due today. Pt verbalized understanding.   

## 2014-07-05 ENCOUNTER — Encounter: Payer: Self-pay | Admitting: Cardiology

## 2014-07-07 ENCOUNTER — Other Ambulatory Visit: Payer: Self-pay | Admitting: Internal Medicine

## 2014-07-14 DIAGNOSIS — D692 Other nonthrombocytopenic purpura: Secondary | ICD-10-CM | POA: Diagnosis not present

## 2014-07-14 DIAGNOSIS — M25549 Pain in joints of unspecified hand: Secondary | ICD-10-CM | POA: Diagnosis not present

## 2014-07-14 DIAGNOSIS — I10 Essential (primary) hypertension: Secondary | ICD-10-CM | POA: Diagnosis not present

## 2014-07-14 DIAGNOSIS — M25519 Pain in unspecified shoulder: Secondary | ICD-10-CM | POA: Diagnosis not present

## 2014-07-17 DIAGNOSIS — I2510 Atherosclerotic heart disease of native coronary artery without angina pectoris: Secondary | ICD-10-CM | POA: Diagnosis not present

## 2014-07-17 DIAGNOSIS — I509 Heart failure, unspecified: Secondary | ICD-10-CM | POA: Diagnosis not present

## 2014-07-17 DIAGNOSIS — E785 Hyperlipidemia, unspecified: Secondary | ICD-10-CM | POA: Diagnosis not present

## 2014-07-17 DIAGNOSIS — I252 Old myocardial infarction: Secondary | ICD-10-CM | POA: Diagnosis not present

## 2014-07-17 DIAGNOSIS — I2589 Other forms of chronic ischemic heart disease: Secondary | ICD-10-CM | POA: Diagnosis not present

## 2014-07-17 DIAGNOSIS — I10 Essential (primary) hypertension: Secondary | ICD-10-CM | POA: Diagnosis not present

## 2014-07-18 ENCOUNTER — Telehealth: Payer: Self-pay | Admitting: Internal Medicine

## 2014-07-18 NOTE — Telephone Encounter (Signed)
New message    Patient called the on-call service just now for pacemaker report.

## 2014-07-19 ENCOUNTER — Encounter: Payer: Self-pay | Admitting: Internal Medicine

## 2014-07-19 ENCOUNTER — Ambulatory Visit (INDEPENDENT_AMBULATORY_CARE_PROVIDER_SITE_OTHER): Payer: Medicare Other | Admitting: *Deleted

## 2014-07-19 DIAGNOSIS — I2589 Other forms of chronic ischemic heart disease: Secondary | ICD-10-CM | POA: Diagnosis not present

## 2014-07-19 DIAGNOSIS — I255 Ischemic cardiomyopathy: Secondary | ICD-10-CM

## 2014-07-19 NOTE — Progress Notes (Signed)
Remote ICD transmission.   

## 2014-07-19 NOTE — Telephone Encounter (Signed)
Spoke with pt wife and informed her that we did not receive transmission on 9-1. I instructed how to send transmission. She verbalized.

## 2014-07-20 DIAGNOSIS — M19049 Primary osteoarthritis, unspecified hand: Secondary | ICD-10-CM | POA: Diagnosis not present

## 2014-07-20 DIAGNOSIS — I10 Essential (primary) hypertension: Secondary | ICD-10-CM | POA: Diagnosis not present

## 2014-07-20 DIAGNOSIS — E119 Type 2 diabetes mellitus without complications: Secondary | ICD-10-CM | POA: Diagnosis not present

## 2014-07-20 DIAGNOSIS — E7800 Pure hypercholesterolemia, unspecified: Secondary | ICD-10-CM | POA: Diagnosis not present

## 2014-07-20 LAB — MDC_IDC_ENUM_SESS_TYPE_REMOTE
Implantable Pulse Generator Serial Number: 112776
Lead Channel Impedance Value: 417 Ohm
Lead Channel Impedance Value: 570 Ohm
Lead Channel Sensing Intrinsic Amplitude: 10.3 mV
MDC IDC MSMT LEADCHNL RA SENSING INTR AMPL: 2 mV

## 2014-07-24 DIAGNOSIS — E785 Hyperlipidemia, unspecified: Secondary | ICD-10-CM | POA: Diagnosis not present

## 2014-07-24 DIAGNOSIS — I10 Essential (primary) hypertension: Secondary | ICD-10-CM | POA: Diagnosis not present

## 2014-07-24 DIAGNOSIS — I2510 Atherosclerotic heart disease of native coronary artery without angina pectoris: Secondary | ICD-10-CM | POA: Diagnosis not present

## 2014-07-24 DIAGNOSIS — I252 Old myocardial infarction: Secondary | ICD-10-CM | POA: Diagnosis not present

## 2014-07-24 DIAGNOSIS — I509 Heart failure, unspecified: Secondary | ICD-10-CM | POA: Diagnosis not present

## 2014-07-24 DIAGNOSIS — N189 Chronic kidney disease, unspecified: Secondary | ICD-10-CM | POA: Diagnosis not present

## 2014-08-08 DIAGNOSIS — R7301 Impaired fasting glucose: Secondary | ICD-10-CM | POA: Diagnosis not present

## 2014-08-08 DIAGNOSIS — D649 Anemia, unspecified: Secondary | ICD-10-CM | POA: Diagnosis not present

## 2014-08-08 DIAGNOSIS — I2510 Atherosclerotic heart disease of native coronary artery without angina pectoris: Secondary | ICD-10-CM | POA: Diagnosis not present

## 2014-08-08 DIAGNOSIS — R55 Syncope and collapse: Secondary | ICD-10-CM | POA: Diagnosis not present

## 2014-08-08 DIAGNOSIS — I10 Essential (primary) hypertension: Secondary | ICD-10-CM | POA: Diagnosis not present

## 2014-08-08 DIAGNOSIS — Z79899 Other long term (current) drug therapy: Secondary | ICD-10-CM | POA: Diagnosis not present

## 2014-08-16 ENCOUNTER — Encounter: Payer: Self-pay | Admitting: Cardiology

## 2014-08-18 DIAGNOSIS — R55 Syncope and collapse: Secondary | ICD-10-CM | POA: Diagnosis not present

## 2014-08-18 DIAGNOSIS — S0990XA Unspecified injury of head, initial encounter: Secondary | ICD-10-CM | POA: Diagnosis not present

## 2014-08-18 DIAGNOSIS — I6523 Occlusion and stenosis of bilateral carotid arteries: Secondary | ICD-10-CM | POA: Diagnosis not present

## 2014-08-23 DIAGNOSIS — I2510 Atherosclerotic heart disease of native coronary artery without angina pectoris: Secondary | ICD-10-CM | POA: Diagnosis not present

## 2014-08-23 DIAGNOSIS — I129 Hypertensive chronic kidney disease with stage 1 through stage 4 chronic kidney disease, or unspecified chronic kidney disease: Secondary | ICD-10-CM | POA: Diagnosis not present

## 2014-08-23 DIAGNOSIS — N189 Chronic kidney disease, unspecified: Secondary | ICD-10-CM | POA: Diagnosis not present

## 2014-08-23 DIAGNOSIS — I509 Heart failure, unspecified: Secondary | ICD-10-CM | POA: Diagnosis not present

## 2014-08-23 DIAGNOSIS — E785 Hyperlipidemia, unspecified: Secondary | ICD-10-CM | POA: Diagnosis not present

## 2014-08-23 DIAGNOSIS — E780 Pure hypercholesterolemia: Secondary | ICD-10-CM | POA: Diagnosis not present

## 2014-08-23 DIAGNOSIS — I10 Essential (primary) hypertension: Secondary | ICD-10-CM | POA: Diagnosis not present

## 2014-08-23 DIAGNOSIS — I252 Old myocardial infarction: Secondary | ICD-10-CM | POA: Diagnosis not present

## 2014-08-25 ENCOUNTER — Other Ambulatory Visit: Payer: Self-pay | Admitting: *Deleted

## 2014-08-25 ENCOUNTER — Encounter: Payer: Self-pay | Admitting: Surgery

## 2014-08-25 DIAGNOSIS — I779 Disorder of arteries and arterioles, unspecified: Secondary | ICD-10-CM

## 2014-08-25 DIAGNOSIS — I739 Peripheral vascular disease, unspecified: Principal | ICD-10-CM

## 2014-08-31 DIAGNOSIS — R7301 Impaired fasting glucose: Secondary | ICD-10-CM | POA: Diagnosis not present

## 2014-09-07 NOTE — Telephone Encounter (Signed)
Received request from Nurse fax box, documents faxed for surgical clearance. To: [facility] Fax number: [phone] Attention: 10.1.2014/SP

## 2014-09-15 ENCOUNTER — Encounter: Payer: Self-pay | Admitting: Surgery

## 2014-09-18 ENCOUNTER — Ambulatory Visit (HOSPITAL_COMMUNITY)
Admission: RE | Admit: 2014-09-18 | Discharge: 2014-09-18 | Disposition: A | Discharge status: Home / Self Care | Payer: Medicare Other | Source: Ambulatory Visit | Attending: Surgery | Admitting: Surgery

## 2014-09-18 ENCOUNTER — Ambulatory Visit (INDEPENDENT_AMBULATORY_CARE_PROVIDER_SITE_OTHER): Payer: Medicare Other | Admitting: Surgery

## 2014-09-18 ENCOUNTER — Encounter: Payer: Self-pay | Admitting: Surgery

## 2014-09-18 VITALS — BP 147/93 | HR 58 | Ht 71.0 in | Wt 170.0 lb

## 2014-09-18 DIAGNOSIS — I6529 Occlusion and stenosis of unspecified carotid artery: Secondary | ICD-10-CM

## 2014-09-18 DIAGNOSIS — I2589 Other forms of chronic ischemic heart disease: Secondary | ICD-10-CM | POA: Diagnosis not present

## 2014-09-18 DIAGNOSIS — I739 Peripheral vascular disease, unspecified: Secondary | ICD-10-CM

## 2014-09-18 DIAGNOSIS — R55 Syncope and collapse: Secondary | ICD-10-CM | POA: Diagnosis not present

## 2014-09-18 DIAGNOSIS — I779 Disorder of arteries and arterioles, unspecified: Secondary | ICD-10-CM | POA: Diagnosis not present

## 2014-09-18 NOTE — Progress Notes (Signed)
HISTORY AND PHYSICAL     CC:  Arterial blockage in neck Referring Provider:  Ernestene Kiel, MD  HPI: This is a 73 y.o. male who states that he has been passing out and collapsing on multiple occasions.  He states that when it happens, he "feels like he is on a cloud and then collapses".  He states that he does have an associated mild headache after the events.  He does not have any hemiparesis, speech difficulties or amaurosis fugax.  He states that he does get some numbness in his right hand while driving.   His wife states that he did have an elbow injury that did cause some nerve damage.  In his evaluation for syncope, a carotid duplex was obtained at outside facility, which revealed possibly greater than 70% stenosis of the right ICA and greater than 50% of the left ICA.  He states that his blood pressure has been up and down as high as 812 systolic and as low as 90 systolic.  He states that he does see Dr. Caryl Comes for skipped beats, hypertension and he has a defibrillator, which was replaced in January 2015.   He did have an echo in December 2014, which revealed an EF of 45%.  There was moderated hypokinesis of the base/mid inferior wall of the LV.  The left atrium was mildly dilated and the right ventricle cavity size was normal.   He does have a hx of triple bypass surgery in 1999. He is on a statin for his hypercholesterolemia.  He is on an ARB and beta blocker for his HTN.  He also takes a daily aspirin.    He is a recovering alcoholic of 23 years and has remote tobacco use and has been quit for 15 years.  Past Medical History  Diagnosis Date  . Dyspnea   . Ischemic cardiomyopathy   . ICD (implantable cardiac defibrillator)  BSx   . Atrial fibrillation   . Myocardial infarction   . Peripheral vascular disease     Past Surgical History  Procedure Laterality Date  . Cardiac defibrillator placement    . Coronary artery bypass graft    . Hernia repair      x2  . Rotator cuff  repair Right 2006  . Elbow surgery Left     plastic bone replacement  . Sinus exploration  1987    Allergies  Allergen Reactions  . Amlodipine     Pt not sure if this is an allergy  . Tramadol Other (See Comments)    Unknown     Current Outpatient Prescriptions  Medication Sig Dispense Refill  . aspirin 81 MG tablet Take 81 mg by mouth 2 (two) times daily.     Marland Kitchen buPROPion (WELLBUTRIN XL) 300 MG 24 hr tablet Take 300 mg by mouth daily.      . carvedilol (COREG) 25 MG tablet TAKE 1 TABLET (25 MG TOTAL) BY MOUTH 2 (TWO) TIMES DAILY WITH A MEAL. 60 tablet 5  . Cyanocobalamin (VITAMIN B-12) 6000 MCG SUBL Place 6,000 mcg under the tongue daily.    Marland Kitchen escitalopram (LEXAPRO) 10 MG tablet Take 10 mg by mouth at bedtime.     . famotidine (PEPCID) 20 MG tablet Take 20 mg by mouth at bedtime.     . gabapentin (NEURONTIN) 300 MG capsule Take 300 mg by mouth 2 (two) times daily as needed (leg/shoulder pain).     . isosorbide mononitrate (IMDUR) 60 MG 24 hr tablet Take 1 tablet (  60 mg total) by mouth daily. At bedtime 30 tablet 3  . nitroGLYCERIN (NITROSTAT) 0.4 MG SL tablet Place 0.4 mg under the tongue every 5 (five) minutes as needed for chest pain.    Marland Kitchen oxyCODONE-acetaminophen (PERCOCET) 10-325 MG per tablet Take 1 tablet by mouth every 4 (four) hours as needed for pain.    . pantoprazole (PROTONIX) 40 MG tablet Take 40 mg by mouth 2 (two) times daily.     Marland Kitchen PRESCRIPTION MEDICATION Apply 1 application topically 2 (two) times daily as needed (for shoulder pain). COMPOUNDED CREAM: ketoprofen 20%, baclofen 20%, cyclobenzaprine 20%, gabapentin 6%, and lidocaine 2.5%    . quinapril (ACCUPRIL) 40 MG tablet TAKE 1 TABLET BY MOUTH AT BEDTIME 30 tablet 3  . rosuvastatin (CRESTOR) 20 MG tablet Take 20 mg by mouth daily.      . traZODone (DESYREL) 50 MG tablet Take 50 mg by mouth at bedtime as needed for sleep.    . valsartan (DIOVAN) 320 MG tablet Take 320 mg by mouth daily.       No current  facility-administered medications for this visit.    Family History  Problem Relation Age of Onset  . Heart disease Mother   . Heart attack Mother   . Heart disease Father   . Heart attack Father   . Heart disease Brother   . Heart attack Brother     History   Social History  . Marital Status: Married    Spouse Name: N/A    Number of Children: N/A  . Years of Education: N/A   Occupational History  . Not on file.   Social History Main Topics  . Smoking status: Former Smoker    Quit date: 09/19/1999  . Smokeless tobacco: Not on file     Comment: quit in 1998  . Alcohol Use: No     Comment: recovering alcoholic, quit 16+ yrs ago  . Drug Use: No  . Sexual Activity: Not on file   Other Topics Concern  . Not on file   Social History Narrative     ROS: [x]  Positive   [ ]  Negative   [ ]  All sytems reviewed and are negative  Cardiovascular: []  chest pain/pressure [x]  palpitations-skipping heartbeat []  SOB lying flat []  DOE []  pain in legs while walking [x]  pain in feet when lying flat-sharp at night  []  hx of DVT []  hx of phlebitis []  swelling in legs []  varicose veins  Pulmonary: []  productive cough []  asthma []  wheezing  Neurologic: []  weakness in []  arms []  legs [x]  numbness in []  arms []  legs [x]  fingers [] difficulty speaking or slurred speech []  temporary loss of vision in one eye [x]  dizziness  Hematologic: []  bleeding problems []  problems with blood clotting easily  GI []  vomiting blood []  blood in stool  GU: []  burning with urination []  blood in urine  Psychiatric: [x]  hx of major depression  Integumentary: []  rashes []  ulcers  Constitutional: []  fever []  chills   PHYSICAL EXAMINATION:  Filed Vitals:   09/18/14 1407  BP: 147/93  Pulse:    Body mass index is 23.72 kg/(m^2).  General:  WDWN in NAD Gait: Normal HENT: WNL, normocephalic Pulmonary: normal non-labored breathing , without Rales, rhonchi,  wheezing Cardiac:  RRR, without  Murmurs, rubs or gallops; without carotid bruits Abdomen: soft, NT, no masses Skin: without rashes, without ulcers  Vascular Exam/Pulses:  Right Left  Radial 2+ (normal) 2+ (normal)   Extremities: without ischemic changes, without  Gangrene , without cellulitis; without open wounds;  Musculoskeletal: no muscle wasting or atrophy  Neurologic: A&O X 3; Appropriate Affect ; SENSATION: normal; MOTOR FUNCTION:  moving all extremities equally. Speech is fluent/normal   Non-Invasive Vascular Imaging:   Limited Carotid duplex right on 09/18/14:  1.  No significant stenosis of the right ECA or CCA 2.  40-59% stenosis of the right ICA   Pt meds includes: Statin:  Yes.   Beta Blocker:  Yes.   Aspirin:  Yes.   ACEI:  No. ARB:  Yes.   Other Antiplatelet/Anticoagulant:  No.    ASSESSMENT/PLAN:: 73 y.o. male with recent hx of syncope and recent outside facility carotid duplex of > 70% stenosis right ICA and is referred to [facility] for further evaluation.   -carotid duplex today reveals 40-59% right ICA stenosis.  The pt does not have any hemiparesis, amaurosis fugax, or speech difficulties.  Dr. Trula Slade does not feel his syncopal episodes are related to his carotid disease.   -the pt does state that he has a mild headache after these events.  Dr. Trula Slade would like for the pt to see a neurologist to evaluate this pt. -we will also set the pt up to see Dr. Caryl Comes this week to see if these falls are possibly from a cardiac source. -the pt will f/u in 6 months in our office with a carotid duplex scan.   Leontine Locket, PA-C [facility] [phone]  Clinic MD:  Pt seen and examined in conjunction with Dr. Trula Slade  I agree with the above.  The patient was referred for syncope.  He had an outside ultrasound revealing greater than 70% right carotid stenosis.  This was repeated at our office which showed 40-59 percent right carotid stenosis.  The patient continues to  have ongoing episodes of near syncope.  They are associated with headache.  I do not think that his episodes are related to his extracranial carotid occlusive disease.  I wonder if this is seizure activity, versus migraine with aura, versus cardiac.  I am sending him back to see Dr. Caryl Comes who has evaluated his pacemaker recently.  I'm also make an appointment with neurology.  I will have him follow-up with me in 6 months with a repeat carotid ultrasound  [place]

## 2014-09-21 ENCOUNTER — Ambulatory Visit (INDEPENDENT_AMBULATORY_CARE_PROVIDER_SITE_OTHER): Payer: Medicare Other | Admitting: Neurology

## 2014-09-21 ENCOUNTER — Encounter: Payer: Self-pay | Admitting: Neurology

## 2014-09-21 VITALS — BP 122/91 | HR 52 | Ht 70.0 in | Wt 168.6 lb

## 2014-09-21 DIAGNOSIS — R55 Syncope and collapse: Secondary | ICD-10-CM | POA: Diagnosis not present

## 2014-09-21 DIAGNOSIS — I2589 Other forms of chronic ischemic heart disease: Secondary | ICD-10-CM | POA: Diagnosis not present

## 2014-09-21 HISTORY — DX: Syncope and collapse: R55

## 2014-09-21 NOTE — Progress Notes (Signed)
Reason for visit: Near-syncope  Connor Wilkerson is a 73 y.o. male  History of present illness:  Connor Wilkerson is a 73 year old right-handed white male with a history of episodes of near-syncope that began about 6 months ago. The patient believes that he has had greater than 30 events since that time, with the last event occurring within 5 days of this office visit. The patient indicates that the episodes are stereotypical, beginning with a fall to the ground without warning. He is always standing when the episode begins, he has never had an episode while sitting or lying down. The patient will be on the ground for about 1 minute, indicating that he is unable to move. The patient reports that he has numbness of the left hand during the event. The wife indicates that he will have some confusion, and slurring of the words. The patient has had one event in a doctor's office, and he was noted to have a systolic blood pressure that was around 90. The patient indicates that his blood pressures are quite labile, and the pressures are sometimes quite high, greater than 086 systolic, sometimes much lower. The patient notes a bifrontal headache following the near syncopal events that may last for several hours. The patient never completely loses consciousness, and he indicates that he can remember events that occur around him during the episode. The patient feels normal after the episode resolves. He has a defibrillator in place, and he will be seeing his cardiologist, Dr. Caryl Comes, in the near future. The patient has undergone a carotid Doppler study recently showing 40-59% stenosis on the right internal carotid artery, no disease on the left was noted. He is sent to this office for further evaluation. He indicates that he is operating a motor vehicle at this time.  Past Medical History  Diagnosis Date  . Dyspnea   . Ischemic cardiomyopathy   . ICD (implantable cardiac defibrillator)  BSx   . Atrial  fibrillation   . Myocardial infarction   . Peripheral vascular disease   . Near syncope 09/21/2014    Past Surgical History  Procedure Laterality Date  . Cardiac defibrillator placement    . Coronary artery bypass graft    . Hernia repair      x2  . Rotator cuff repair Right 2006  . Elbow surgery Left     plastic bone replacement  . Sinus exploration  1987    Family History  Problem Relation Age of Onset  . Heart disease Mother   . Heart attack Mother   . Heart disease Father   . Heart attack Father   . Heart disease Brother   . Heart attack Brother     Social history:  reports that he quit smoking about 15 years ago. He has never used smokeless tobacco. He reports that he does not drink alcohol or use illicit drugs.  Medications:  Current Outpatient Prescriptions on File Prior to Visit  Medication Sig Dispense Refill  . aspirin 81 MG tablet Take 81 mg by mouth 2 (two) times daily.     Marland Kitchen buPROPion (WELLBUTRIN XL) 300 MG 24 hr tablet Take 300 mg by mouth daily.      . carvedilol (COREG) 25 MG tablet TAKE 1 TABLET (25 MG TOTAL) BY MOUTH 2 (TWO) TIMES DAILY WITH A MEAL. 60 tablet 5  . escitalopram (LEXAPRO) 10 MG tablet Take 10 mg by mouth at bedtime.     . famotidine (PEPCID) 20 MG tablet  Take 20 mg by mouth at bedtime.     . gabapentin (NEURONTIN) 300 MG capsule Take 300 mg by mouth 2 (two) times daily as needed (leg/shoulder pain).     . isosorbide mononitrate (IMDUR) 60 MG 24 hr tablet Take 1 tablet (60 mg total) by mouth daily. At bedtime 30 tablet 3  . nitroGLYCERIN (NITROSTAT) 0.4 MG SL tablet Place 0.4 mg under the tongue every 5 (five) minutes as needed for chest pain.    Marland Kitchen oxyCODONE-acetaminophen (PERCOCET) 10-325 MG per tablet Take 1 tablet by mouth every 4 (four) hours as needed for pain.    . pantoprazole (PROTONIX) 40 MG tablet Take 40 mg by mouth 2 (two) times daily.     Marland Kitchen PRESCRIPTION MEDICATION Apply 1 application topically 2 (two) times daily as needed (for  shoulder pain). COMPOUNDED CREAM: ketoprofen 20%, baclofen 20%, cyclobenzaprine 20%, gabapentin 6%, and lidocaine 2.5%    . quinapril (ACCUPRIL) 40 MG tablet TAKE 1 TABLET BY MOUTH AT BEDTIME 30 tablet 3  . rosuvastatin (CRESTOR) 20 MG tablet Take 20 mg by mouth daily.      . traZODone (DESYREL) 50 MG tablet Take 50 mg by mouth at bedtime as needed for sleep.    . valsartan (DIOVAN) 320 MG tablet Take 320 mg by mouth daily.       No current facility-administered medications on file prior to visit.      Allergies  Allergen Reactions  . Amlodipine     Pt not sure if this is an allergy  . Tramadol Other (See Comments)    Unknown     ROS:  Out of a complete 14 system review of symptoms, the patient complains only of the following symptoms, and all other reviewed systems are negative.  Weight loss, fatigue Hearing loss Easy bruising Feeling cold Joint pain Memory loss, confusion, headache, numbness, near-syncope Too much sleep, decreased energy, change in appetite Sleepiness  Blood pressure 122/91, pulse 52, height 5\' 10"  (1.778 m), weight 168 lb 9.6 oz (76.476 kg).  Physical Exam  General: The patient is alert and cooperative at the time of the examination.  Eyes: Pupils are equal, round, and reactive to light. Discs are flat bilaterally.  Neck: The neck is supple, no carotid bruits are noted.  Respiratory: The respiratory examination is clear.  Cardiovascular: The cardiovascular examination reveals a regular rate and rhythm, no obvious murmurs or rubs are noted.  Skin: Extremities are without significant edema.  Neurologic Exam  Mental status: The patient is alert and oriented x 3 at the time of the examination. The patient has apparent normal recent and remote memory, with an apparently normal attention span and concentration ability.  Cranial nerves: Facial symmetry is present. There is good sensation of the face to pinprick and soft touch bilaterally. The strength  of the facial muscles and the muscles to head turning and shoulder shrug are normal bilaterally. Speech is well enunciated, no aphasia or dysarthria is noted. Extraocular movements are full. Visual fields are full. The tongue is midline, and the patient has symmetric elevation of the soft palate. No obvious hearing deficits are noted.  Motor: The motor testing reveals 5 over 5 strength of all 4 extremities, with exception that there is mild weakness of the intrinsic muscles of the left hand. Good symmetric motor tone is noted throughout.  Sensory: Sensory testing is intact to pinprick, soft touch, vibration sensation, and position sense on all 4 extremities, with exception that there is some slight decrease  in pinprick sensation on the left forearm. No evidence of extinction is noted.  Coordination: Cerebellar testing reveals good finger-nose-finger and heel-to-shin bilaterally.  Gait and station: Gait is normal. Tandem gait is slightly unsteady. Romberg is negative. No drift is seen.  Reflexes: Deep tendon reflexes are symmetric and normal bilaterally. Toes are downgoing bilaterally.   Assessment/Plan:  1. Episodic near-syncope  The patient has reported a six-month history of multiple episodes of near-syncope. At least one episode has been documented to be associated with a low systolic blood pressure. Episodes only occur while standing. The patient will be set up for a CT scan of the brain, and CT angiogram of the head and neck. An EEG study will be done. He will follow-up in 3-4 months. The patient will also be seen through cardiology. I have asked the patient not to operate a motor vehicle until the etiology of these events has been better delineated.  Jill Alexanders MD 09/21/2014 9:04 PM  [facility] [address] [place], [place] [address]  Phone [phone] Fax [phone]

## 2014-09-21 NOTE — Patient Instructions (Signed)
Near-Syncope Near-syncope (commonly known as near fainting) is sudden weakness, dizziness, or feeling like you might pass out. During an episode of near-syncope, you may also develop pale skin, have tunnel vision, or feel sick to your stomach (nauseous). Near-syncope may occur when getting up after sitting or while standing for a long time. It is caused by a sudden decrease in blood flow to the brain. This decrease can result from various causes or triggers, most of which are not serious. However, because near-syncope can sometimes be a sign of something serious, a medical evaluation is required. The specific cause is often not determined. HOME CARE INSTRUCTIONS  Monitor your condition for any changes. The following actions may help to alleviate any discomfort you are experiencing:  Have someone stay with you until you feel stable.  Lie down right away and prop your feet up if you start feeling like you might faint. Breathe deeply and steadily. Wait until all the symptoms have passed. Most of these episodes last only a few minutes. You may feel tired for several hours.   Drink enough fluids to keep your urine clear or pale yellow.   If you are taking blood pressure or heart medicine, get up slowly when seated or lying down. Take several minutes to sit and then stand. This can reduce dizziness.  Follow up with your health care provider as directed. SEEK IMMEDIATE MEDICAL CARE IF:   You have a severe headache.   You have unusual pain in the chest, abdomen, or back.   You are bleeding from the mouth or rectum, or you have black or tarry stool.   You have an irregular or very fast heartbeat.   You have repeated fainting or have seizure-like jerking during an episode.   You faint when sitting or lying down.   You have confusion.   You have difficulty walking.   You have severe weakness.   You have vision problems.  MAKE SURE YOU:   Understand these instructions.  Will  watch your condition.  Will get help right away if you are not doing well or get worse. Document Released: 10/20/2005 Document Revised: 10/25/2013 Document Reviewed: 03/25/2013 Exit[employer] Patient Information 2015 [employer]. This information is not intended to replace advice given to you by your health care provider. Make sure you discuss any questions you have with your health care provider.  

## 2014-09-25 ENCOUNTER — Other Ambulatory Visit: Payer: Self-pay | Admitting: Internal Medicine

## 2014-09-26 ENCOUNTER — Ambulatory Visit (INDEPENDENT_AMBULATORY_CARE_PROVIDER_SITE_OTHER): Payer: Medicare Other | Admitting: Neurology

## 2014-09-26 ENCOUNTER — Telehealth: Payer: Self-pay | Admitting: Neurology

## 2014-09-26 DIAGNOSIS — R55 Syncope and collapse: Secondary | ICD-10-CM

## 2014-09-26 NOTE — Telephone Encounter (Signed)
I called the patient. The EEG study done was unremarkable. I will call the patient when I get the results of the CT angiogram.

## 2014-09-26 NOTE — Procedures (Signed)
    History:  Connor Wilkerson is a 73 year old gentleman with a history of cardiac disease, with a defibrillator in place. The patient has had multiple episodes of near-syncope, sometimes associated with a fall. The patient is being evaluated for these episodes.  This is a routine EEG. No skull defects are noted. Medications include aspirin, Wellbutrin, Coreg, Flexeril, Lexapro, Pepcid, Neurontin, hydralazine, isosorbide mononitrate, oxycodone, Protonix, Accupril, Crestor, trazodone, and Diovan.   EEG classification: Normal awake  Description of the recording: The background rhythms of this recording consists of a fairly well modulated medium amplitude alpha rhythm of 8 Hz that is reactive to eye opening and closure. As the record progresses, the patient appears to remain in the waking state throughout the recording. Photic stimulation was performed, resulting in a bilateral and symmetric photic driving response. Hyperventilation was also performed, resulting in a minimal buildup of the background rhythm activities without significant slowing seen. At no time during the recording does there appear to be evidence of spike or spike wave discharges or evidence of focal slowing. EKG monitor shows frequent PVC's with a heart rate of 72.  Impression: This is a normal EEG recording in the waking state. No evidence of ictal or interictal discharges are seen.

## 2014-09-27 ENCOUNTER — Telehealth: Payer: Self-pay | Admitting: *Deleted

## 2014-09-27 DIAGNOSIS — Z79899 Other long term (current) drug therapy: Secondary | ICD-10-CM | POA: Diagnosis not present

## 2014-09-27 NOTE — Telephone Encounter (Signed)
Informed wife that transmission was received. I informed her that lead numbers were WNL and pt has not had any arrhythmia on device that would explain the many presyncopal episodes that he's had lately. Wife voiced understanding. I encouraged her to give [place] a call if he has more of these episodes.

## 2014-09-29 ENCOUNTER — Telehealth: Payer: Self-pay | Admitting: Internal Medicine

## 2014-09-29 NOTE — Telephone Encounter (Signed)
New Msg   Patient's wife is returning the call from Wednesday and states she is unsure as to why she was called. Please contact at [phone].

## 2014-09-29 NOTE — Telephone Encounter (Signed)
Returned call from patient's wife, Chandra Feger.  Informed her of previous results that were called to them on Wednesday. Informed her that Dr. Jannifer Franklin will be calling after her husband's CT on 10/03/14 when it results. Mrs. Mccole verbalized understanding and had no further questions.  Ewell Poe RN

## 2014-10-02 ENCOUNTER — Telehealth: Payer: Self-pay | Admitting: Neurology

## 2014-10-02 ENCOUNTER — Telehealth: Payer: Self-pay | Admitting: *Deleted

## 2014-10-02 NOTE — Telephone Encounter (Signed)
Pt's wife wants to know if the patient should go ahead and have the CT scan or should they wait, because he was taking the blood pressure medication incorrectly.  She is just not sure what to do.  Please call and advise, he is scheduled to have a CT tomorrow at 3:00pm.

## 2014-10-02 NOTE — Telephone Encounter (Signed)
New Message  Pt wife called stating- pt has had episodes of Falling- 2 times this weekend (BP after 1st fall was 90/60; after 2nd was 122/70)  Sent in reports from machine.  Please call back and discuss.

## 2014-10-02 NOTE — Telephone Encounter (Signed)
Wife calling ( have permission from husband) stating he has fallen 2 times over the weekend.  States Fri night was coming out of BR and just went to the floor.  Didn't really pass out but was "fuzzy" for several minutes.  BP was 90/60. Then again last night was walking down hallway and fell to floor.  BP 122/70.  She wants to know if defrib showed anything.  Spoke w/Paula in Device and states she pulled up recording from 11/29 and 11/25 and no abnormalities.  When reviewing his chart saw that he sees Dr. Ophelia Charter as Cardiologist.  Reviewed meds with his wife and are not consistent with our list (last seen 03/29/14).  Spoke w/Lori Gerhardt,NP who advises for him to call Dr. Ophelia Charter.  Advised wife that our NP advises for him to call Dr. Ophelia Charter regarding his falling episodes.  Advised wife that Defib did not show any irregular rates.  She understands and will call his office.

## 2014-10-02 NOTE — Telephone Encounter (Signed)
I called patient, talk with the wife. The patient apparently was put on a new combination blood pressure medication, but he did not stop taking the older medications, effectively doubling his blood pressure medication. The patient has had for the blackouts, systolic blood pressures documented in the 90 range. Okay to delay or hold off on the CT scan of the head for now, the patient will be placed back on his proper medication regimen, if the blackouts continue, the CT scan of the brain can be reordered. The family will contact me.

## 2014-10-03 ENCOUNTER — Inpatient Hospital Stay: Admission: RE | Admit: 2014-10-03 | Payer: Medicare Other | Source: Ambulatory Visit

## 2014-10-03 ENCOUNTER — Inpatient Hospital Stay
Admission: RE | Admit: 2014-10-03 | Discharge: 2014-10-03 | Disposition: A | Discharge status: Home / Self Care | Payer: Medicare Other | Source: Ambulatory Visit | Attending: Neurology | Admitting: Neurology

## 2014-10-04 ENCOUNTER — Encounter: Payer: Self-pay | Admitting: Internal Medicine

## 2014-10-06 ENCOUNTER — Encounter: Payer: Self-pay | Admitting: Internal Medicine

## 2014-10-12 ENCOUNTER — Encounter (HOSPITAL_COMMUNITY): Payer: Self-pay | Admitting: Internal Medicine

## 2014-10-12 DIAGNOSIS — M1990 Unspecified osteoarthritis, unspecified site: Secondary | ICD-10-CM | POA: Diagnosis not present

## 2014-10-12 DIAGNOSIS — I10 Essential (primary) hypertension: Secondary | ICD-10-CM | POA: Diagnosis not present

## 2014-10-12 DIAGNOSIS — R7301 Impaired fasting glucose: Secondary | ICD-10-CM | POA: Diagnosis not present

## 2014-10-12 DIAGNOSIS — I2510 Atherosclerotic heart disease of native coronary artery without angina pectoris: Secondary | ICD-10-CM | POA: Diagnosis not present

## 2014-10-13 ENCOUNTER — Ambulatory Visit (INDEPENDENT_AMBULATORY_CARE_PROVIDER_SITE_OTHER): Payer: Medicare Other | Admitting: Physician Assistant

## 2014-10-13 ENCOUNTER — Encounter: Payer: Self-pay | Admitting: Internal Medicine

## 2014-10-13 ENCOUNTER — Ambulatory Visit (INDEPENDENT_AMBULATORY_CARE_PROVIDER_SITE_OTHER): Payer: Medicare Other | Admitting: *Deleted

## 2014-10-13 ENCOUNTER — Encounter: Payer: Self-pay | Admitting: Physician Assistant

## 2014-10-13 VITALS — BP 122/75 | HR 60 | Ht 70.0 in | Wt 167.0 lb

## 2014-10-13 DIAGNOSIS — I480 Paroxysmal atrial fibrillation: Secondary | ICD-10-CM

## 2014-10-13 DIAGNOSIS — Z95810 Presence of automatic (implantable) cardiac defibrillator: Secondary | ICD-10-CM | POA: Diagnosis not present

## 2014-10-13 DIAGNOSIS — I2589 Other forms of chronic ischemic heart disease: Secondary | ICD-10-CM

## 2014-10-13 DIAGNOSIS — I10 Essential (primary) hypertension: Secondary | ICD-10-CM | POA: Diagnosis not present

## 2014-10-13 DIAGNOSIS — E785 Hyperlipidemia, unspecified: Secondary | ICD-10-CM

## 2014-10-13 DIAGNOSIS — I255 Ischemic cardiomyopathy: Secondary | ICD-10-CM | POA: Diagnosis not present

## 2014-10-13 DIAGNOSIS — I2510 Atherosclerotic heart disease of native coronary artery without angina pectoris: Secondary | ICD-10-CM

## 2014-10-13 DIAGNOSIS — I959 Hypotension, unspecified: Secondary | ICD-10-CM

## 2014-10-13 DIAGNOSIS — I5022 Chronic systolic (congestive) heart failure: Secondary | ICD-10-CM | POA: Diagnosis not present

## 2014-10-13 LAB — MDC_IDC_ENUM_SESS_TYPE_INCLINIC
Date Time Interrogation Session: 20151211050000
HighPow Impedance: 46 Ohm
Implantable Pulse Generator Serial Number: 112776
Lead Channel Impedance Value: 405 Ohm
Lead Channel Impedance Value: 608 Ohm
Lead Channel Pacing Threshold Amplitude: 0.6 V
Lead Channel Sensing Intrinsic Amplitude: 2.3 mV
Lead Channel Setting Pacing Amplitude: 2.4 V
Lead Channel Setting Pacing Pulse Width: 0.4 ms
Lead Channel Setting Sensing Sensitivity: 0.6 mV
MDC IDC MSMT LEADCHNL RA PACING THRESHOLD PULSEWIDTH: 0.4 ms
MDC IDC MSMT LEADCHNL RV PACING THRESHOLD AMPLITUDE: 0.8 V
MDC IDC MSMT LEADCHNL RV PACING THRESHOLD PULSEWIDTH: 0.4 ms
MDC IDC MSMT LEADCHNL RV SENSING INTR AMPL: 9.6 mV
MDC IDC SET LEADCHNL RA PACING AMPLITUDE: 2 V
MDC IDC SET ZONE DETECTION INTERVAL: 250 ms
Zone Setting Detection Interval: 286 ms
Zone Setting Detection Interval: 333 ms

## 2014-10-13 NOTE — Progress Notes (Addendum)
Cardiology Office Note   Date:  10/13/2014   ID:  Connor Wilkerson, DOB Jan 29, 73 1942, MRN [id number]  PCP:  Ernestene Kiel, MD  Cardiologist:  Dr. Terrence Dupont  Electrophysiologist:  Dr. Virl Axe    History of Present Illness: Connor Wilkerson is a 73 y.o. male with a hx of CAD s/p IMI and subsequent CABG in 8115, ICM, systolic HF, s/p AICD, HTN, HL, PAF.  He underwent generator change of his ICD last year.  Patient saw Dr. Caryl Comes in 12/2013.  BP dropped 40 mmHg from lying to standing.  BP medications were adjusted.  He has recently seen Dr. Jannifer Franklin with neurology for near syncope.  CT of the brain and neck and EEG have been ordered.  His BP has been noted to be low at times.  He is here by himself today. He is really not sure why he is here.  He tells me that Dr. Terrence Dupont stopped his Delene Loll and his Valsartan.  He denies any further dizziness since.  He tells me that he is feeling pretty good now.  The patient denies chest pain, shortness of breath, syncope, orthopnea, PND or significant pedal edema.    Studies:   - LHC (12/11):  Inferior HK, EF 40-45%, ostial and distal left main 10-20%, ostial and proximal LAD 30-40% then occluded, proximal D3 20-25%, ostial circumflex 40-50% then occluded, ostial RCA occluded, SVG-OM patent, SVG-PDA 80-85% at the anastomosis, LIMA-LAD patent >> medical therapy of S-PDA unless fails then high risk PCI  - Echo (12/14):  Base/mid inferior and inferolateral HK, EF 45%, mild LAE, mildly reduced RVSF  - Nuclear (6/15):  Lateral wall infarction/scar, no ischemia, EF 30%  - Carotid US (11/15):  RICA 40-59%   Recent Labs: 11/23/2013: Hemoglobin 12.6* 11/25/2013: BUN 19; Creatinine 1.34; Potassium 3.8; Sodium 147    Recent Radiology: No results found.    Wt Readings from Last 3 Encounters:  09/21/14 168 lb 9.6 oz (76.476 kg)  09/18/14 170 lb (77.111 kg)  03/29/14 167 lb (75.751 kg)     Past Medical History  Diagnosis Date  . Dyspnea   . Ischemic  cardiomyopathy   . ICD (implantable cardiac defibrillator)  BSx   . Atrial fibrillation   . Myocardial infarction   . Peripheral vascular disease   . Near syncope 09/21/2014    Current Outpatient Prescriptions  Medication Sig Dispense Refill  . aspirin 81 MG tablet Take 81 mg by mouth 2 (two) times daily.     Marland Kitchen buPROPion (WELLBUTRIN XL) 300 MG 24 hr tablet Take 300 mg by mouth daily.      . carvedilol (COREG) 25 MG tablet TAKE 1 TABLET (25 MG TOTAL) BY MOUTH 2 (TWO) TIMES DAILY WITH A MEAL. 60 tablet 5  . cyclobenzaprine (FLEXERIL) 5 MG tablet Take 5 mg by mouth 3 (three) times daily.  2  . escitalopram (LEXAPRO) 10 MG tablet Take 10 mg by mouth at bedtime.     . famotidine (PEPCID) 20 MG tablet Take 20 mg by mouth at bedtime.     . gabapentin (NEURONTIN) 300 MG capsule Take 300 mg by mouth 2 (two) times daily as needed (leg/shoulder pain).     . isosorbide mononitrate (IMDUR) 60 MG 24 hr tablet Take 1 tablet (60 mg total) by mouth daily. At bedtime 30 tablet 3  . nitroGLYCERIN (NITROSTAT) 0.4 MG SL tablet Place 0.4 mg under the tongue every 5 (five) minutes as needed for chest pain.    Marland Kitchen oxyCODONE-acetaminophen (PERCOCET)  10-325 MG per tablet Take 1 tablet by mouth every 4 (four) hours as needed for pain.    . pantoprazole (PROTONIX) 40 MG tablet Take 40 mg by mouth 2 (two) times daily.     . rosuvastatin (CRESTOR) 20 MG tablet Take 20 mg by mouth daily.      . traZODone (DESYREL) 50 MG tablet Take 50 mg by mouth at bedtime as needed for sleep.    . valsartan (DIOVAN) 320 MG tablet Take 320 mg by mouth 2 (two) times daily.     . hydrALAZINE (APRESOLINE) 10 MG tablet Take 10 mg by mouth 3 (three) times daily.     No current facility-administered medications for this visit.     Allergies:   Amlodipine and Tramadol   Social History:  The patient  reports that he quit smoking about 15 years ago. He has never used smokeless tobacco. He reports that he does not drink alcohol or use illicit  drugs.   Family History:  The patient's family history includes Heart attack in his brother, father, and mother; Heart disease in his brother, father, and mother.    ROS:  Please see the history of present illness.       All other systems reviewed and negative.    PHYSICAL EXAM: VS:  BP 122/75 mmHg  Pulse 60  Ht 5\' 10"  (1.778 m)  Wt 167 lb (75.751 kg)  BMI 23.96 kg/m2 Well nourished, well developed, in no acute distress HEENT: normal Neck: no JVD Cardiac:  normal S1, S2;  RRR; no murmur   Lungs:   clear to auscultation bilaterally, no wheezing, rhonchi or rales Abd: soft, nontender, no hepatomegaly Ext: n0 edema Skin: warm and dry Neuro:  CNs 2-12 intact, no focal abnormalities noted  EKG:  A paced, HR 60, inf-lat ST abnormality, no significant change from prior tracing.      Device Interrogation:  Normal function.  1 episode of NSVT - 6 beats in 07/2014.  1 episode of AFib x 4 minutes in 06/2014.  No other recorded episodes.  ASSESSMENT AND PLAN:  1.  Hypotension:  This appears to have resolved with adjustments in his medications. FU with primary cardiologist as indicated.   2.  CAD:  No angina.  Continue ASA, beta blocker, statin. 3.  Ischemic Cardiomyopathy:  Continue beta blocker, hydralazine, nitrates.  He is now off Entresto and Valsartan. 4.  Chronic Systolic CHF:  Volume stable.  5.  S/p AICD:  FU with EP as planned.  6.  Hypertension:  Controlled.  7.  Hyperlipidemia:  Continue statin. 8.  Carotid Stenosis:  FU with VVS as planned.  9.  Paroxysmal Atrial Fibrillation:  He had one brief episode in Aug x 4 minutes.  I will review this with Dr. Virl Axe to see if [facility] is indicated.  CHADS2-VASc=4.   Disposition:   FU with Dr. Virl Axe as planned.    Signed, Versie Starks, MHS 10/13/2014 10:47 AM    [facility] [address], [place], [place]  [address] Phone: [phone]; Fax: [phone]

## 2014-10-13 NOTE — Patient Instructions (Signed)
FOLLOW UP WITH DR. Caryl Comes 12/22/14  Your physician recommends that you continue on your current medications as directed. Please refer to the Current Medication list given to you today.

## 2014-10-13 NOTE — Progress Notes (Signed)
ICD check in clinic w/Scott Weaver. Normal device function. Thresholds and sensing consistent with previous device measurements. Impedance trends stable over time. 1 NSVT on 9-26 x 6 bts @ 65/117. 1 mode switch episode on 8-14 x 4 mins @ 267/96---AFL + ASA. Histogram distribution appropriate for patient and level of activity. No changes made this session. Device programmed at appropriate safety margins. Device programmed to optimize intrinsic conduction. Estimated longevity 10.5 years. Pt enrolled in remote follow-up. Plan to follow up with SK in 12-2014.

## 2014-10-31 ENCOUNTER — Telehealth: Payer: Self-pay | Admitting: *Deleted

## 2014-10-31 NOTE — Telephone Encounter (Signed)
pt notified per Dr. Caryl Comes and Brynda Rim. PA only blood thinner needed at this time is his ASA 81 mg. Pt said thank you.

## 2014-11-06 DIAGNOSIS — L853 Xerosis cutis: Secondary | ICD-10-CM | POA: Diagnosis not present

## 2014-11-06 DIAGNOSIS — R233 Spontaneous ecchymoses: Secondary | ICD-10-CM | POA: Diagnosis not present

## 2014-11-06 DIAGNOSIS — L821 Other seborrheic keratosis: Secondary | ICD-10-CM | POA: Diagnosis not present

## 2014-11-06 DIAGNOSIS — L570 Actinic keratosis: Secondary | ICD-10-CM | POA: Diagnosis not present

## 2014-11-29 DIAGNOSIS — I255 Ischemic cardiomyopathy: Secondary | ICD-10-CM | POA: Diagnosis not present

## 2014-11-29 DIAGNOSIS — N189 Chronic kidney disease, unspecified: Secondary | ICD-10-CM | POA: Diagnosis not present

## 2014-11-29 DIAGNOSIS — I252 Old myocardial infarction: Secondary | ICD-10-CM | POA: Diagnosis not present

## 2014-11-29 DIAGNOSIS — I10 Essential (primary) hypertension: Secondary | ICD-10-CM | POA: Diagnosis not present

## 2014-11-29 DIAGNOSIS — I2510 Atherosclerotic heart disease of native coronary artery without angina pectoris: Secondary | ICD-10-CM | POA: Diagnosis not present

## 2014-11-29 DIAGNOSIS — E785 Hyperlipidemia, unspecified: Secondary | ICD-10-CM | POA: Diagnosis not present

## 2014-11-29 DIAGNOSIS — I5020 Unspecified systolic (congestive) heart failure: Secondary | ICD-10-CM | POA: Diagnosis not present

## 2014-12-16 DIAGNOSIS — M19042 Primary osteoarthritis, left hand: Secondary | ICD-10-CM | POA: Diagnosis not present

## 2014-12-16 DIAGNOSIS — M79645 Pain in left finger(s): Secondary | ICD-10-CM | POA: Diagnosis not present

## 2014-12-22 ENCOUNTER — Ambulatory Visit (INDEPENDENT_AMBULATORY_CARE_PROVIDER_SITE_OTHER): Payer: Medicare Other | Admitting: Internal Medicine

## 2014-12-22 ENCOUNTER — Encounter: Payer: Self-pay | Admitting: Internal Medicine

## 2014-12-22 VITALS — BP 190/90 | HR 75 | Ht 70.0 in | Wt 169.4 lb

## 2014-12-22 DIAGNOSIS — Z4502 Encounter for adjustment and management of automatic implantable cardiac defibrillator: Secondary | ICD-10-CM | POA: Diagnosis not present

## 2014-12-22 DIAGNOSIS — I255 Ischemic cardiomyopathy: Secondary | ICD-10-CM | POA: Diagnosis not present

## 2014-12-22 LAB — MDC_IDC_ENUM_SESS_TYPE_INCLINIC
Date Time Interrogation Session: 20160219050000
HighPow Impedance: 50 Ohm
Implantable Pulse Generator Serial Number: 112776
Lead Channel Impedance Value: 547 Ohm
Lead Channel Pacing Threshold Amplitude: 0.8 V
Lead Channel Pacing Threshold Pulse Width: 0.4 ms
Lead Channel Pacing Threshold Pulse Width: 0.4 ms
Lead Channel Setting Pacing Amplitude: 2.4 V
Lead Channel Setting Pacing Pulse Width: 0.4 ms
MDC IDC MSMT LEADCHNL RA IMPEDANCE VALUE: 407 Ohm
MDC IDC MSMT LEADCHNL RA PACING THRESHOLD AMPLITUDE: 0.7 V
MDC IDC MSMT LEADCHNL RA SENSING INTR AMPL: 2.3 mV
MDC IDC MSMT LEADCHNL RV SENSING INTR AMPL: 11.5 mV
MDC IDC SET LEADCHNL RA PACING AMPLITUDE: 2 V
MDC IDC SET LEADCHNL RV SENSING SENSITIVITY: 0.6 mV
MDC IDC SET ZONE DETECTION INTERVAL: 286 ms
Zone Setting Detection Interval: 250 ms
Zone Setting Detection Interval: 333 ms

## 2014-12-22 NOTE — Progress Notes (Signed)
Patient Care Team: Ernestene Kiel, MD as PCP - General (Internal Medicine) Thyra Breed, MD as Consulting Physician (Rheumatology) Deboraha Sprang, MD as Consulting Physician (Cardiology) Serafina Mitchell, MD as Consulting Physician (Vascular Surgery)   HPI  Connor Wilkerson is a 74 y.o. male  Seen following ICD generator replacement for ischemic cardiomyopathy subsequetly  the device pocket moved to subpectorally.  He is exceedingly pleased with the cosmetic results of the ICD positioning.   His blood pressure is quite elevated. He brings in measurements from home demonstrating blood pressures ranging from 1:30--170.  He denies chest pain or changes in functional status. There is no edema    Past Medical History     Past Medical History  Diagnosis Date  . Dyspnea   . Ischemic cardiomyopathy   . ICD (implantable cardiac defibrillator)  BSx   . Atrial fibrillation   . Myocardial infarction   . Peripheral vascular disease   . Near syncope 09/21/2014    Past Surgical History  Procedure Laterality Date  . Cardiac defibrillator placement    . Coronary artery bypass graft    . Hernia repair      x2  . Rotator cuff repair Right 2006  . Elbow surgery Left     plastic bone replacement  . Sinus exploration  1987  . Permanent pacemaker generator change N/A 11/25/2013    Procedure: PERMANENT PACEMAKER GENERATOR CHANGE;  Surgeon: Deboraha Sprang, MD;  Location: [facility] CATH LAB;  Service: Cardiovascular;  Laterality: N/A;    Current Outpatient Prescriptions  Medication Sig Dispense Refill  . aspirin 81 MG tablet Take 81 mg by mouth 2 (two) times daily.     Marland Kitchen buPROPion (WELLBUTRIN XL) 300 MG 24 hr tablet Take 300 mg by mouth daily.      . carvedilol (COREG) 25 MG tablet TAKE 1 TABLET (25 MG TOTAL) BY MOUTH 2 (TWO) TIMES DAILY WITH A MEAL. 60 tablet 5  . cyclobenzaprine (FLEXERIL) 5 MG tablet Take 5 mg by mouth 3 (three) times daily.  2  . escitalopram (LEXAPRO) 10  MG tablet Take 10 mg by mouth at bedtime.     . famotidine (PEPCID) 20 MG tablet Take 20 mg by mouth at bedtime.     . gabapentin (NEURONTIN) 300 MG capsule Take 300 mg by mouth 2 (two) times daily as needed (leg/shoulder pain).     . hydrALAZINE (APRESOLINE) 10 MG tablet Take 10 mg by mouth 3 (three) times daily.    . isosorbide mononitrate (IMDUR) 60 MG 24 hr tablet Take 1 tablet (60 mg total) by mouth daily. At bedtime 30 tablet 3  . nitroGLYCERIN (NITROSTAT) 0.4 MG SL tablet Place 0.4 mg under the tongue every 5 (five) minutes as needed for chest pain.    Marland Kitchen oxyCODONE-acetaminophen (PERCOCET) 10-325 MG per tablet Take 1 tablet by mouth every 4 (four) hours as needed for pain.    . pantoprazole (PROTONIX) 40 MG tablet Take 40 mg by mouth 2 (two) times daily.     . rosuvastatin (CRESTOR) 20 MG tablet Take 20 mg by mouth daily.      . traZODone (DESYREL) 50 MG tablet Take 50 mg by mouth at bedtime as needed for sleep.    . valsartan (DIOVAN) 320 MG tablet Take 320 mg by mouth 2 (two) times daily.      No current facility-administered medications for this visit.    Allergies  Allergen Reactions  . Amlodipine  Pt not sure if this is an allergy  . Tramadol Other (See Comments)    Unknown     Review of Systems negative except from HPI and PMH  Physical Exam BP 190/90 mmHg  Pulse 75  Ht 5\' 10"  (1.778 m)  Wt 169 lb 6.4 oz (76.839 kg)  BMI 24.31 kg/m2 Well developed and nourished in no acute distress HENT normal Neck supple with JVP-flat Clear Regular rate and rhythm, no murmurs or gallops Abd-soft with active BS No Clubbing cyanosis edema Skin-warm and dry A & Oriented  Grossly normal sensory and motor function    Assessment and  Plan\  Ischemic cardiac myopathy  Implantable defibrillator The patient's device was interrogated.  The information was reviewed. No changes were made in the programming.     Hypertension-severe  Congestive heart  failure-chronic-systolic  Euvolemic continue current meds  Without symptoms of ischemia   I will have him increase his hydralazine from 10 3 times a day--20 3 times a day. He is to see Dr. Ophelia Charter next week. I will defer to him further up titration of his antihypertensives.

## 2014-12-22 NOTE — Patient Instructions (Signed)
Your physician has recommended you make the following change in your medication:  1) INCREASE Hydralazine to 20 mg three times daily  Remote monitoring is used to monitor your Pacemaker of ICD from home. This monitoring reduces the number of office visits required to check your device to one time per year. It allows [place] to keep an eye on the functioning of your device to ensure it is working properly. You are scheduled for a device check from home on 03/22/15. You may send your transmission at any time that day. If you have a wireless device, the transmission will be sent automatically. After your physician reviews your transmission, you will receive a postcard with your next transmission date.  Your physician wants you to follow-up in: 1 year with Dr. Caryl Comes.  You will receive a reminder letter in the mail two months in advance. If you don't receive a letter, please call our office to schedule the follow-up appointment.

## 2014-12-29 ENCOUNTER — Encounter: Payer: Self-pay | Admitting: Internal Medicine

## 2014-12-29 DIAGNOSIS — I10 Essential (primary) hypertension: Secondary | ICD-10-CM | POA: Diagnosis not present

## 2014-12-29 DIAGNOSIS — E785 Hyperlipidemia, unspecified: Secondary | ICD-10-CM | POA: Diagnosis not present

## 2014-12-29 DIAGNOSIS — I255 Ischemic cardiomyopathy: Secondary | ICD-10-CM | POA: Diagnosis not present

## 2014-12-29 DIAGNOSIS — I5020 Unspecified systolic (congestive) heart failure: Secondary | ICD-10-CM | POA: Diagnosis not present

## 2014-12-29 DIAGNOSIS — I2510 Atherosclerotic heart disease of native coronary artery without angina pectoris: Secondary | ICD-10-CM | POA: Diagnosis not present

## 2014-12-29 DIAGNOSIS — I252 Old myocardial infarction: Secondary | ICD-10-CM | POA: Diagnosis not present

## 2015-01-09 DIAGNOSIS — J329 Chronic sinusitis, unspecified: Secondary | ICD-10-CM | POA: Diagnosis not present

## 2015-01-09 DIAGNOSIS — I10 Essential (primary) hypertension: Secondary | ICD-10-CM | POA: Diagnosis not present

## 2015-01-09 DIAGNOSIS — I509 Heart failure, unspecified: Secondary | ICD-10-CM | POA: Diagnosis not present

## 2015-01-18 DIAGNOSIS — L570 Actinic keratosis: Secondary | ICD-10-CM | POA: Diagnosis not present

## 2015-01-18 DIAGNOSIS — L219 Seborrheic dermatitis, unspecified: Secondary | ICD-10-CM | POA: Diagnosis not present

## 2015-01-24 DIAGNOSIS — M65332 Trigger finger, left middle finger: Secondary | ICD-10-CM | POA: Diagnosis not present

## 2015-01-24 DIAGNOSIS — M79645 Pain in left finger(s): Secondary | ICD-10-CM | POA: Diagnosis not present

## 2015-02-07 ENCOUNTER — Encounter: Payer: Self-pay | Admitting: Neurology

## 2015-02-07 ENCOUNTER — Ambulatory Visit (INDEPENDENT_AMBULATORY_CARE_PROVIDER_SITE_OTHER): Payer: Medicare Other | Admitting: Neurology

## 2015-02-07 VITALS — BP 147/92 | HR 72 | Ht 70.0 in | Wt 171.0 lb

## 2015-02-07 DIAGNOSIS — I255 Ischemic cardiomyopathy: Secondary | ICD-10-CM | POA: Diagnosis not present

## 2015-02-07 DIAGNOSIS — R5383 Other fatigue: Secondary | ICD-10-CM | POA: Diagnosis not present

## 2015-02-07 DIAGNOSIS — R269 Unspecified abnormalities of gait and mobility: Secondary | ICD-10-CM

## 2015-02-07 DIAGNOSIS — E538 Deficiency of other specified B group vitamins: Secondary | ICD-10-CM | POA: Diagnosis not present

## 2015-02-07 DIAGNOSIS — R55 Syncope and collapse: Secondary | ICD-10-CM

## 2015-02-07 DIAGNOSIS — R5381 Other malaise: Secondary | ICD-10-CM | POA: Diagnosis not present

## 2015-02-07 HISTORY — DX: Unspecified abnormalities of gait and mobility: R26.9

## 2015-02-07 NOTE — Patient Instructions (Signed)

## 2015-02-07 NOTE — Progress Notes (Signed)
Reason for visit: syncope  Connor Wilkerson is an 74 y.o. male  History of present illness:  Connor Wilkerson is a 74 year old right-handed white male with a history of multiple episodes of syncope and near-syncope. The patient has had EEG evaluation in the past that was unremarkable. He was to be set up for a CT scan of the brain with a CT angiogram of the head and neck, but his wife had noted that he was taking his blood pressure medications improperly, he was supposed to stop one of the blood pressure medications, and he did not. He had episodes of syncope associated with blood pressures in the 60 systolic range. The CT of the head was canceled, and the patient's medications were readjusted. Unfortunately, the patient has continued to have episodes of syncope and near-syncope on a regular basis, the last episode was 2 weeks ago. The patient does have palpitations of the heart, but he denies any sensations of heart rhythm abnormalities around the time of the syncopal events. The patient will commonly have events where he feels unsteady for a few seconds after he stands up and tries to walk. He will need to hold onto something, and then let the episodes pass. He returns this office for an evaluation.  Past Medical History  Diagnosis Date  . Dyspnea   . Ischemic cardiomyopathy   . ICD (implantable cardiac defibrillator)  BSx   . Atrial fibrillation   . Myocardial infarction   . Peripheral vascular disease   . Near syncope 09/21/2014  . Abnormality of gait 02/07/2015    Past Surgical History  Procedure Laterality Date  . Cardiac defibrillator placement    . Coronary artery bypass graft    . Hernia repair      x2  . Rotator cuff repair Right 2006  . Elbow surgery Left     plastic bone replacement  . Sinus exploration  1987  . Permanent pacemaker generator change N/A 11/25/2013    Procedure: PERMANENT PACEMAKER GENERATOR CHANGE;  Surgeon: Deboraha Sprang, MD;  Location: [facility] CATH LAB;  Service:  Cardiovascular;  Laterality: N/A;    Family History  Problem Relation Age of Onset  . Heart disease Mother   . Heart attack Mother   . Heart disease Father   . Heart attack Father   . Heart disease Brother   . Heart attack Brother     Social history:  reports that he quit smoking about 15 years ago. He has never used smokeless tobacco. He reports that he does not drink alcohol or use illicit drugs.    Allergies  Allergen Reactions  . Amlodipine     Pt not sure if this is an allergy  . Tramadol Other (See Comments)    Unknown     Medications:  Prior to Admission medications   Medication Sig Start Date End Date Taking? Authorizing Provider  aspirin 81 MG tablet Take 81 mg by mouth 2 (two) times daily.    Yes Historical Provider, MD  buPROPion (WELLBUTRIN XL) 300 MG 24 hr tablet Take 300 mg by mouth daily.     Yes Historical Provider, MD  carvedilol (COREG) 25 MG tablet TAKE 1 TABLET (25 MG TOTAL) BY MOUTH 2 (TWO) TIMES DAILY WITH A MEAL. 07/07/14  Yes Deboraha Sprang, MD  cyclobenzaprine (FLEXERIL) 5 MG tablet Take 5 mg by mouth 3 (three) times daily. 07/29/14  Yes Historical Provider, MD  escitalopram (LEXAPRO) 10 MG tablet Take 10 mg by  mouth at bedtime.    Yes Historical Provider, MD  famotidine (PEPCID) 20 MG tablet Take 20 mg by mouth at bedtime.    Yes Historical Provider, MD  gabapentin (NEURONTIN) 300 MG capsule Take 300 mg by mouth 2 (two) times daily as needed (leg/shoulder pain).    Yes Historical Provider, MD  hydrALAZINE (APRESOLINE) 10 MG tablet Take 20 mg by mouth 3 (three) times daily. 09/16/14  Yes Historical Provider, MD  isosorbide mononitrate (IMDUR) 60 MG 24 hr tablet Take 1 tablet (60 mg total) by mouth daily. At bedtime 12/15/13  Yes Deboraha Sprang, MD  nitroGLYCERIN (NITROSTAT) 0.4 MG SL tablet Place 0.4 mg under the tongue every 5 (five) minutes as needed for chest pain.   Yes Historical Provider, MD  oxyCODONE-acetaminophen (PERCOCET) 10-325 MG per tablet Take  1 tablet by mouth every 4 (four) hours as needed for pain.   Yes Historical Provider, MD  pantoprazole (PROTONIX) 40 MG tablet Take 40 mg by mouth 2 (two) times daily.    Yes Historical Provider, MD  rosuvastatin (CRESTOR) 20 MG tablet Take 20 mg by mouth daily.     Yes Historical Provider, MD  sacubitril-valsartan (ENTRESTO) 97-103 MG Take 1 tablet by mouth 2 (two) times daily.   Yes Historical Provider, MD  traZODone (DESYREL) 50 MG tablet Take 50 mg by mouth at bedtime as needed for sleep.   Yes Historical Provider, MD    ROS:  Out of a complete 14 system review of symptoms, the patient complains only of the following symptoms, and all other reviewed systems are negative.  Appetite change, fatigue, weight loss Hearing loss Restless legs, insomnia, daytime sleepiness, acting out dreams Joint pain, joint swelling, walking difficulty Bruising easily Memory loss, dizziness, speech difficulty Confusion, decreased concentration, depression  Blood pressure 147/92, pulse 72, height 5\' 10"  (1.778 m), weight 171 lb (77.565 kg).   Blood pressure, right arm, sitting is 138/90. Blood pressure, standing, right arm is 138/88.  Physical Exam  General: The patient is alert and cooperative at the time of the examination.  Skin: No significant peripheral edema is noted.   Neurologic Exam  Mental status: The patient is alert and oriented x 3 at the time of the examination. The patient has apparent normal recent and remote memory, with an apparently normal attention span and concentration ability.   Cranial nerves: Facial symmetry is present. Speech is normal, no aphasia or dysarthria is noted. Extraocular movements are full. Visual fields are full.  Motor: The patient has good strength in all 4 extremities.  Sensory examination: Soft touch sensation is symmetric on the face, arms, and legs.  Coordination: The patient has good finger-nose-finger and heel-to-shin bilaterally.  Gait and  station: The patient has a normal gait. Tandem gait is unsteady. Romberg is negative. No drift is seen.  Reflexes: Deep tendon reflexes are symmetric.   Assessment/Plan:  1. Syncope/near-syncope  2. Gait instability  The patient will be sent for blood work today looking for etiologies of gait instability. The patient will be set up for the CT scan of the brain, and a CT angiogram of the head and neck. The will follow-up in about 4 months. The patient may need to use a cane when he is outside the house for ambulation.  Jill Alexanders MD 02/07/2015 6:33 PM  [facility] [address] [place], [place] [address]  Phone [phone] Fax [phone]

## 2015-02-08 ENCOUNTER — Other Ambulatory Visit: Payer: Self-pay | Admitting: Internal Medicine

## 2015-02-08 NOTE — Telephone Encounter (Signed)
Deboraha Sprang, MD at 12/22/2014 12:34 PM  carvedilol (COREG) 25 MG tabletTAKE 1 TABLET (25 MG TOTAL) BY MOUTH 2 (TWO) TIMES DAILY WITH A MEAL  Your physician has recommended you make the following change in your medication:  1) INCREASE Hydralazine to 20 mg three times daily

## 2015-02-09 ENCOUNTER — Telehealth: Payer: Self-pay | Admitting: Neurology

## 2015-02-09 LAB — TSH: TSH: 6.35 u[IU]/mL — ABNORMAL HIGH (ref 0.450–4.500)

## 2015-02-09 LAB — METHYLMALONIC ACID, SERUM: Methylmalonic Acid: 481 nmol/L — ABNORMAL HIGH (ref 0–378)

## 2015-02-09 LAB — COPPER, SERUM: COPPER: 91 ug/dL (ref 72–166)

## 2015-02-09 LAB — RPR: RPR: NONREACTIVE

## 2015-02-09 LAB — VITAMIN B12: Vitamin B-12: 507 pg/mL (ref 211–946)

## 2015-02-09 NOTE — Telephone Encounter (Signed)
I called the patient. Blood work is unremarkable with exception of a slightly high TSH. I sent the blood work to the primary care physician. The methylmalonic acid level is elevated, but the biting B12 level is normal. Methylmalonic acid level elevations are common in the elderly.

## 2015-02-15 ENCOUNTER — Other Ambulatory Visit: Payer: Self-pay | Admitting: Neurology

## 2015-02-15 ENCOUNTER — Telehealth: Payer: Self-pay

## 2015-02-15 ENCOUNTER — Telehealth: Payer: Self-pay | Admitting: Neurology

## 2015-02-15 DIAGNOSIS — R55 Syncope and collapse: Secondary | ICD-10-CM | POA: Diagnosis not present

## 2015-02-15 DIAGNOSIS — R269 Unspecified abnormalities of gait and mobility: Secondary | ICD-10-CM | POA: Diagnosis not present

## 2015-02-15 DIAGNOSIS — Z5181 Encounter for therapeutic drug level monitoring: Secondary | ICD-10-CM

## 2015-02-15 LAB — BASIC METABOLIC PANEL
BUN: 17 mg/dL (ref 6–23)
CHLORIDE: 104 meq/L (ref 96–112)
CO2: 27 mEq/L (ref 19–32)
Calcium: 9 mg/dL (ref 8.4–10.5)
Creat: 1.42 mg/dL — ABNORMAL HIGH (ref 0.50–1.35)
GLUCOSE: 75 mg/dL (ref 70–99)
POTASSIUM: 4.4 meq/L (ref 3.5–5.3)
Sodium: 141 mEq/L (ref 135–145)

## 2015-02-15 NOTE — Telephone Encounter (Signed)
Connor Wilkerson with [facility] stated orders for BW are needed for pt.  Patient there to have bw drawn prior to CT on 4/15.

## 2015-02-15 NOTE — Telephone Encounter (Signed)
I spoke to Teodoro Spray with [facility]. She needed to know what labs Dr. Jannifer Franklin needed. Per Dr. Jannifer Franklin, he needed a BMET. She also asked for the patient's primary and secondary diagnosis codes, which I gave her (near syncope R55 and abnormality of gait R26.9).

## 2015-02-15 NOTE — Telephone Encounter (Signed)
I have put in orders for the Bmet

## 2015-02-16 ENCOUNTER — Ambulatory Visit
Admission: RE | Admit: 2015-02-16 | Discharge: 2015-02-16 | Disposition: A | Discharge status: Home / Self Care | Payer: Medicare Other | Source: Ambulatory Visit | Attending: Neurology | Admitting: Neurology

## 2015-02-16 ENCOUNTER — Inpatient Hospital Stay: Admission: RE | Admit: 2015-02-16 | Payer: BC Managed Care – PPO | Source: Ambulatory Visit

## 2015-02-16 DIAGNOSIS — R2689 Other abnormalities of gait and mobility: Secondary | ICD-10-CM | POA: Diagnosis not present

## 2015-02-16 DIAGNOSIS — R55 Syncope and collapse: Secondary | ICD-10-CM

## 2015-02-16 DIAGNOSIS — I6523 Occlusion and stenosis of bilateral carotid arteries: Secondary | ICD-10-CM | POA: Diagnosis not present

## 2015-02-16 DIAGNOSIS — R269 Unspecified abnormalities of gait and mobility: Secondary | ICD-10-CM

## 2015-02-16 MED ORDER — IOPAMIDOL (ISOVUE-370) INJECTION 76%
75.0000 mL | Freq: Once | INTRAVENOUS | Status: AC | PRN
  Administered 2015-02-16: 75 mL via INTRAVENOUS

## 2015-02-20 ENCOUNTER — Telehealth: Payer: Self-pay | Admitting: Neurology

## 2015-02-20 NOTE — Telephone Encounter (Signed)
I call the patient. No VBI, stenosis of the ICA bilaterally, nonsurgical. Will follow.   CTA head/neck 02/16/15:  IMPRESSION: 60% stenosis RIGHT ICA with both calcified and noncalcified plaque.  Borderline stenosis LEFT ICA measuring just under 50%. See discussion above.  No intracranial abnormalities are evident.

## 2015-02-26 DIAGNOSIS — I5020 Unspecified systolic (congestive) heart failure: Secondary | ICD-10-CM | POA: Diagnosis not present

## 2015-02-26 DIAGNOSIS — I2510 Atherosclerotic heart disease of native coronary artery without angina pectoris: Secondary | ICD-10-CM | POA: Diagnosis not present

## 2015-02-26 DIAGNOSIS — I10 Essential (primary) hypertension: Secondary | ICD-10-CM | POA: Diagnosis not present

## 2015-02-26 DIAGNOSIS — I252 Old myocardial infarction: Secondary | ICD-10-CM | POA: Diagnosis not present

## 2015-02-26 DIAGNOSIS — E785 Hyperlipidemia, unspecified: Secondary | ICD-10-CM | POA: Diagnosis not present

## 2015-02-26 DIAGNOSIS — I255 Ischemic cardiomyopathy: Secondary | ICD-10-CM | POA: Diagnosis not present

## 2015-02-26 DIAGNOSIS — N189 Chronic kidney disease, unspecified: Secondary | ICD-10-CM | POA: Diagnosis not present

## 2015-03-16 ENCOUNTER — Encounter: Payer: Self-pay | Admitting: Surgery

## 2015-03-19 ENCOUNTER — Other Ambulatory Visit: Payer: Self-pay | Admitting: *Deleted

## 2015-03-19 ENCOUNTER — Inpatient Hospital Stay (HOSPITAL_COMMUNITY): Admission: RE | Admit: 2015-03-19 | Payer: Medicare Other | Source: Ambulatory Visit

## 2015-03-19 ENCOUNTER — Ambulatory Visit (INDEPENDENT_AMBULATORY_CARE_PROVIDER_SITE_OTHER): Payer: Medicare Other | Admitting: Surgery

## 2015-03-19 ENCOUNTER — Encounter: Payer: Self-pay | Admitting: Surgery

## 2015-03-19 VITALS — BP 122/81 | HR 60 | Ht 70.0 in | Wt 170.2 lb

## 2015-03-19 DIAGNOSIS — I255 Ischemic cardiomyopathy: Secondary | ICD-10-CM | POA: Diagnosis not present

## 2015-03-19 DIAGNOSIS — I6521 Occlusion and stenosis of right carotid artery: Secondary | ICD-10-CM | POA: Diagnosis not present

## 2015-03-19 DIAGNOSIS — I6523 Occlusion and stenosis of bilateral carotid arteries: Secondary | ICD-10-CM

## 2015-03-19 NOTE — Progress Notes (Signed)
HISTORY AND PHYSICAL     CC:  F/u for carotid stenosis Referring Provider:  Ernestene Kiel, MD  HPI: This is a 74 y.o. male who presented back in November for near syncope and carotid stenosis.  At that time he was having near syncope spells and falling on multiple occasions.  He states that since he has seen Dr. Kathlen Mody and had his medication for his blood pressure adjusted he has not had any of the above issues.  He was taking the generic and brand name of the same medication.  One was stopped and the other dose decreased in half.  He states he is still having balance issues.  He states he is also having tingling in his feet at night, but he will get up and move around and this improves.  He did see neurology and an EEG was obtained, which was normal.  He has since seen neurology since then in April and work up continues for gait instability.    He states that he still has numbness in his left hand and this is unchanged.  He is going to have trigger finger release by Dr. Amedeo Plenty in early June on his left hand.    He is on a statin for his cholesterol.  He is on a beta blocker for his blood pressure.  He takes an aspirin daily.    Past Medical History  Diagnosis Date  . Dyspnea   . Ischemic cardiomyopathy   . ICD (implantable cardiac defibrillator)  BSx   . Atrial fibrillation   . Myocardial infarction   . Peripheral vascular disease   . Near syncope 09/21/2014  . Abnormality of gait 02/07/2015    Past Surgical History  Procedure Laterality Date  . Cardiac defibrillator placement    . Coronary artery bypass graft    . Hernia repair      x2  . Rotator cuff repair Right 2006  . Elbow surgery Left     plastic bone replacement  . Sinus exploration  1987  . Permanent pacemaker generator change N/A 11/25/2013    Procedure: PERMANENT PACEMAKER GENERATOR CHANGE;  Surgeon: Deboraha Sprang, MD;  Location: [facility] CATH LAB;  Service: Cardiovascular;  Laterality: N/A;    Allergies  Allergen  Reactions  . Amlodipine     Pt not sure if this is an allergy  . Tramadol Other (See Comments)    Unknown     Current Outpatient Prescriptions  Medication Sig Dispense Refill  . aspirin 81 MG tablet Take 81 mg by mouth 2 (two) times daily.     Marland Kitchen buPROPion (WELLBUTRIN XL) 300 MG 24 hr tablet Take 300 mg by mouth daily.      . carvedilol (COREG) 25 MG tablet TAKE 1 TABLET BY MOUTH TWICE A DAY WITH A MEAL 180 tablet 2  . cyclobenzaprine (FLEXERIL) 5 MG tablet Take 5 mg by mouth 3 (three) times daily as needed.   2  . escitalopram (LEXAPRO) 10 MG tablet Take 10 mg by mouth at bedtime.     . famotidine (PEPCID) 20 MG tablet Take 20 mg by mouth at bedtime.     . gabapentin (NEURONTIN) 300 MG capsule Take 300 mg by mouth 2 (two) times daily as needed (leg/shoulder pain).     . hydrALAZINE (APRESOLINE) 10 MG tablet Take 20 mg by mouth 3 (three) times daily.    . isosorbide mononitrate (IMDUR) 60 MG 24 hr tablet Take 1 tablet (60 mg total) by mouth daily. At  bedtime 30 tablet 3  . nitroGLYCERIN (NITROSTAT) 0.4 MG SL tablet Place 0.4 mg under the tongue every 5 (five) minutes as needed for chest pain.    Marland Kitchen oxyCODONE-acetaminophen (PERCOCET) 10-325 MG per tablet Take 1 tablet by mouth every 4 (four) hours as needed for pain.    . pantoprazole (PROTONIX) 40 MG tablet Take 40 mg by mouth 2 (two) times daily.     . rosuvastatin (CRESTOR) 20 MG tablet Take 20 mg by mouth daily.      . sacubitril-valsartan (ENTRESTO) 97-103 MG Take 1 tablet by mouth 2 (two) times daily.    . traZODone (DESYREL) 50 MG tablet Take 50 mg by mouth at bedtime as needed for sleep.     No current facility-administered medications for this visit.    Family History  Problem Relation Age of Onset  . Heart disease Mother   . Heart attack Mother   . Heart disease Father   . Heart attack Father   . Heart disease Brother   . Heart attack Brother     History   Social History  . Marital Status: Married    Spouse Name: N/A    . Number of Children: 1  . Years of Education: HS +   Occupational History  . retired    Social History Main Topics  . Smoking status: Former Smoker    Quit date: 09/19/1999  . Smokeless tobacco: Never Used     Comment: quit in 1998  . Alcohol Use: No     Comment: recovering alcoholic, quit 16+ yrs ago  . Drug Use: No  . Sexual Activity: Not on file   Other Topics Concern  . Not on file   Social History Narrative   Patient is right handed.   Patient drinks 4 cans of soda daily.     ROS: [x]  Positive   [ ]  Negative   [ ]  All sytems reviewed and are negative  Cardiovascular: []  chest pain/pressure []  palpitations []  SOB lying flat []  DOE []  pain in legs while walking [x]  pain in feet when lying flat-tingling []  hx of DVT []  hx of phlebitis []  swelling in legs []  varicose veins  Pulmonary: []  productive cough []  asthma []  wheezing  Neurologic: [x]  weakness in arms or legs []  numbness in []  arms []  legs [x]  left hand [x] difficulty speaking or slurred speech-some []  temporary loss of vision in one eye [x]  dizziness-some [x]  unsteady gait  Hematologic: []  bleeding problems []  problems with blood clotting easily  GI []  vomiting blood []  blood in stool  GU: []  burning with urination []  blood in urine  Psychiatric: []  hx of major depression  Integumentary: []  rashes []  ulcers  Constitutional: []  fever []  chills   PHYSICAL EXAMINATION:  Filed Vitals:   03/19/15 1528  BP: 122/81  Pulse: 60   Body mass index is 24.42 kg/(m^2).  General:  WDWN in NAD Gait: Not observed HENT: WNL, normocephalic Pulmonary: normal non-labored breathing , without Rales, rhonchi,  wheezing Cardiac: RRR, without  Murmurs, rubs or gallops; without carotid bruits Abdomen: soft, NT, no masses Skin: without rashes, without ulcers  Vascular Exam/Pulses:  Right Left  Radial 2+ (normal) 2+ (normal)  DP Unable to palpate  Unable to palpate   PT 2+ (normal) Unable  to palpate    Extremities: without ischemic changes, without Gangrene , without cellulitis; without open wounds;  Musculoskeletal: no muscle wasting or atrophy  Neurologic: A&O X 3; Appropriate Affect ; SENSATION: normal;  MOTOR FUNCTION:  moving all extremities equally. Speech is fluent/normal   Non-Invasive Vascular Imaging:   CTA head/neck 02/16/15: IMPRESSION: 60% stenosis RIGHT ICA with both calcified and noncalcified plaque.  Borderline stenosis LEFT ICA measuring just under 50%. See discussion above.  No intracranial abnormalities are evident  Pt meds includes: Statin:  Yes.   Beta Blocker:  Yes.   Aspirin:  Yes.   ACEI:  No. ARB:  Yes. Other Antiplatelet/Anticoagulant:  No.    ASSESSMENT/PLAN:: 74 y.o. male with right carotid artery stenosis   -pt's symptoms from last visit resolved with blood pressure medication adjustments-he has not had any symptoms since then -CTA of the head/neck April 2016 revealed 60% stenosis of the right ICA and borderline stenosis of the left ICA measuring just under 50%. -the pt continues to be asymptomatic -we will f/u with a carotid duplex in one year and he will see the NP at that time. -will proceed with endarterectomy only if he becomes symptomatic or the stenosis is greater than 80%   Leontine Locket, PA-C [facility] [phone]  Clinic MD:  Pt seen and examined in conjunction with Dr. Trula Slade  I agree with the above.  I have seen and evaluated the patient.  I initially began seeing him for carotid disease that was detected during a syncopal workup.  He was evaluated by neurology as well as cardiology.  He has a history of a pacemaker.  With blood pressure management his symptoms have improved.  He did have a CT angiogram which showed approximate 50-60% bilateral carotid stenosis.  The patient will continue with surveillance of his carotid arteries on an annual basis.  Annamarie Major

## 2015-03-22 ENCOUNTER — Ambulatory Visit (INDEPENDENT_AMBULATORY_CARE_PROVIDER_SITE_OTHER): Payer: Medicare Other | Admitting: *Deleted

## 2015-03-22 DIAGNOSIS — I255 Ischemic cardiomyopathy: Secondary | ICD-10-CM | POA: Diagnosis not present

## 2015-03-22 NOTE — Progress Notes (Signed)
Remote ICD transmission.   

## 2015-03-24 LAB — CUP PACEART REMOTE DEVICE CHECK
Battery Remaining Percentage: 100 %
Brady Statistic RA Percent Paced: 55 %
Date Time Interrogation Session: 20160519180600
HIGH POWER IMPEDANCE MEASURED VALUE: 46 Ohm
Lead Channel Impedance Value: 399 Ohm
Lead Channel Pacing Threshold Amplitude: 0.7 V
Lead Channel Pacing Threshold Amplitude: 0.8 V
Lead Channel Pacing Threshold Pulse Width: 0.4 ms
Lead Channel Pacing Threshold Pulse Width: 0.4 ms
Lead Channel Setting Pacing Pulse Width: 0.4 ms
Lead Channel Setting Sensing Sensitivity: 0.6 mV
MDC IDC MSMT BATTERY REMAINING LONGEVITY: 126 mo
MDC IDC MSMT LEADCHNL RV IMPEDANCE VALUE: 557 Ohm
MDC IDC PG SERIAL: 112776
MDC IDC SET LEADCHNL RA PACING AMPLITUDE: 2 V
MDC IDC SET LEADCHNL RV PACING AMPLITUDE: 2.4 V
MDC IDC SET ZONE DETECTION INTERVAL: 250 ms
MDC IDC STAT BRADY RV PERCENT PACED: 0 %
Zone Setting Detection Interval: 286 ms
Zone Setting Detection Interval: 333 ms

## 2015-04-11 ENCOUNTER — Encounter: Payer: Self-pay | Admitting: Cardiology

## 2015-04-12 DIAGNOSIS — M65332 Trigger finger, left middle finger: Secondary | ICD-10-CM | POA: Diagnosis not present

## 2015-04-17 DIAGNOSIS — M1990 Unspecified osteoarthritis, unspecified site: Secondary | ICD-10-CM | POA: Diagnosis not present

## 2015-04-17 DIAGNOSIS — R7301 Impaired fasting glucose: Secondary | ICD-10-CM | POA: Diagnosis not present

## 2015-04-17 DIAGNOSIS — I10 Essential (primary) hypertension: Secondary | ICD-10-CM | POA: Diagnosis not present

## 2015-04-17 DIAGNOSIS — I2510 Atherosclerotic heart disease of native coronary artery without angina pectoris: Secondary | ICD-10-CM | POA: Diagnosis not present

## 2015-04-18 ENCOUNTER — Encounter: Payer: Self-pay | Admitting: Internal Medicine

## 2015-04-19 DIAGNOSIS — Z4789 Encounter for other orthopedic aftercare: Secondary | ICD-10-CM | POA: Diagnosis not present

## 2015-04-25 DIAGNOSIS — Z4789 Encounter for other orthopedic aftercare: Secondary | ICD-10-CM | POA: Diagnosis not present

## 2015-04-25 DIAGNOSIS — M65332 Trigger finger, left middle finger: Secondary | ICD-10-CM | POA: Diagnosis not present

## 2015-05-16 DIAGNOSIS — M19042 Primary osteoarthritis, left hand: Secondary | ICD-10-CM | POA: Diagnosis not present

## 2015-05-16 DIAGNOSIS — Z4789 Encounter for other orthopedic aftercare: Secondary | ICD-10-CM | POA: Diagnosis not present

## 2015-05-28 DIAGNOSIS — I5020 Unspecified systolic (congestive) heart failure: Secondary | ICD-10-CM | POA: Diagnosis not present

## 2015-05-28 DIAGNOSIS — I252 Old myocardial infarction: Secondary | ICD-10-CM | POA: Diagnosis not present

## 2015-05-28 DIAGNOSIS — E785 Hyperlipidemia, unspecified: Secondary | ICD-10-CM | POA: Diagnosis not present

## 2015-05-28 DIAGNOSIS — I2510 Atherosclerotic heart disease of native coronary artery without angina pectoris: Secondary | ICD-10-CM | POA: Diagnosis not present

## 2015-05-28 DIAGNOSIS — N189 Chronic kidney disease, unspecified: Secondary | ICD-10-CM | POA: Diagnosis not present

## 2015-05-28 DIAGNOSIS — I255 Ischemic cardiomyopathy: Secondary | ICD-10-CM | POA: Diagnosis not present

## 2015-05-28 DIAGNOSIS — I10 Essential (primary) hypertension: Secondary | ICD-10-CM | POA: Diagnosis not present

## 2015-06-04 DIAGNOSIS — R7309 Other abnormal glucose: Secondary | ICD-10-CM | POA: Diagnosis not present

## 2015-06-04 DIAGNOSIS — E785 Hyperlipidemia, unspecified: Secondary | ICD-10-CM | POA: Diagnosis not present

## 2015-06-04 DIAGNOSIS — I10 Essential (primary) hypertension: Secondary | ICD-10-CM | POA: Diagnosis not present

## 2015-06-25 ENCOUNTER — Ambulatory Visit (INDEPENDENT_AMBULATORY_CARE_PROVIDER_SITE_OTHER): Payer: Medicare Other | Admitting: *Deleted

## 2015-06-25 DIAGNOSIS — I255 Ischemic cardiomyopathy: Secondary | ICD-10-CM

## 2015-06-26 NOTE — Progress Notes (Signed)
Remote ICD transmission.   

## 2015-06-27 DIAGNOSIS — Z4789 Encounter for other orthopedic aftercare: Secondary | ICD-10-CM | POA: Diagnosis not present

## 2015-06-30 LAB — CUP PACEART REMOTE DEVICE CHECK
Battery Remaining Longevity: 126 mo
Brady Statistic RV Percent Paced: 0 %
HighPow Impedance: 47 Ohm
Lead Channel Impedance Value: 402 Ohm
Lead Channel Impedance Value: 636 Ohm
Lead Channel Setting Pacing Amplitude: 2 V
Lead Channel Setting Pacing Amplitude: 2.4 V
Lead Channel Setting Pacing Pulse Width: 0.4 ms
Lead Channel Setting Sensing Sensitivity: 0.6 mV
MDC IDC MSMT BATTERY REMAINING PERCENTAGE: 100 %
MDC IDC MSMT LEADCHNL RA SENSING INTR AMPL: 2 mV
MDC IDC MSMT LEADCHNL RV SENSING INTR AMPL: 9.4 mV
MDC IDC SESS DTM: 20160822044100
MDC IDC SET ZONE DETECTION INTERVAL: 286 ms
MDC IDC STAT BRADY RA PERCENT PACED: 56 %
Pulse Gen Serial Number: 112776
Zone Setting Detection Interval: 250 ms
Zone Setting Detection Interval: 333 ms

## 2015-07-13 ENCOUNTER — Encounter: Payer: Self-pay | Admitting: Cardiology

## 2015-07-17 ENCOUNTER — Ambulatory Visit: Payer: Medicare Other | Admitting: Neurology

## 2015-07-19 DIAGNOSIS — I2510 Atherosclerotic heart disease of native coronary artery without angina pectoris: Secondary | ICD-10-CM | POA: Diagnosis not present

## 2015-07-19 DIAGNOSIS — R7301 Impaired fasting glucose: Secondary | ICD-10-CM | POA: Diagnosis not present

## 2015-07-19 DIAGNOSIS — M1990 Unspecified osteoarthritis, unspecified site: Secondary | ICD-10-CM | POA: Diagnosis not present

## 2015-07-19 DIAGNOSIS — I10 Essential (primary) hypertension: Secondary | ICD-10-CM | POA: Diagnosis not present

## 2015-07-23 DIAGNOSIS — D0462 Carcinoma in situ of skin of left upper limb, including shoulder: Secondary | ICD-10-CM | POA: Diagnosis not present

## 2015-07-23 DIAGNOSIS — L570 Actinic keratosis: Secondary | ICD-10-CM | POA: Diagnosis not present

## 2015-07-23 DIAGNOSIS — L219 Seborrheic dermatitis, unspecified: Secondary | ICD-10-CM | POA: Diagnosis not present

## 2015-07-25 ENCOUNTER — Encounter: Payer: Self-pay | Admitting: Internal Medicine

## 2015-07-31 ENCOUNTER — Ambulatory Visit: Payer: Self-pay | Admitting: Neurology

## 2015-08-16 ENCOUNTER — Ambulatory Visit (INDEPENDENT_AMBULATORY_CARE_PROVIDER_SITE_OTHER): Payer: Medicare Other | Admitting: Neurology

## 2015-08-16 ENCOUNTER — Encounter: Payer: Self-pay | Admitting: Neurology

## 2015-08-16 VITALS — BP 149/93 | HR 73 | Ht 70.0 in | Wt 174.5 lb

## 2015-08-16 DIAGNOSIS — R55 Syncope and collapse: Secondary | ICD-10-CM

## 2015-08-16 DIAGNOSIS — R269 Unspecified abnormalities of gait and mobility: Secondary | ICD-10-CM | POA: Diagnosis not present

## 2015-08-16 DIAGNOSIS — I255 Ischemic cardiomyopathy: Secondary | ICD-10-CM

## 2015-08-16 NOTE — Progress Notes (Signed)
Reason for visit: Gait disorder  Connor Wilkerson is an 74 y.o. male  History of present illness:  Connor Wilkerson is a 74 year old right-handed white male with a history of episodes of syncope and near-syncope, and gait instability. The patient indicates that he has done quite well since last seen. He has done a lot better about taking his blood pressure medications properly, and he feels that this has made a significant improvement in his functional level. The patient denies any falls since last seen. Over the last 2 months, he has noted some discomfort at the Achilles tendon insertion point on the calcaneus on the left, but he also has some tingles in the heel at nighttime, with a sudden jerk of the leg that may occur, he may have to get up and walk at night to reduce this symptoms, and then get back to sleep. The patient otherwise denies any new medical issues. He has not had any further episodes of syncope.  Past Medical History  Diagnosis Date  . Dyspnea   . Ischemic cardiomyopathy   . ICD (implantable cardiac defibrillator)  BSx   . Atrial fibrillation ([place])   . Myocardial infarction ([place])   . Peripheral vascular disease ([place])   . Near syncope 09/21/2014  . Abnormality of gait 02/07/2015    Past Surgical History  Procedure Laterality Date  . Cardiac defibrillator placement    . Coronary artery bypass graft    . Hernia repair      x2  . Rotator cuff repair Right 2006  . Elbow surgery Left     plastic bone replacement  . Sinus exploration  1987  . Permanent pacemaker generator change N/A 11/25/2013    Procedure: PERMANENT PACEMAKER GENERATOR CHANGE;  Surgeon: Deboraha Sprang, MD;  Location: [facility] [place] CATH LAB;  Service: Cardiovascular;  Laterality: N/A;    Family History  Problem Relation Age of Onset  . Heart disease Mother   . Heart attack Mother   . Heart disease Father   . Heart attack Father   . Heart disease Brother   . Heart attack Brother     Social history:  reports  that he quit smoking about 15 years ago. He has never used smokeless tobacco. He reports that he does not drink alcohol or use illicit drugs.    Allergies  Allergen Reactions  . Amlodipine     Pt not sure if this is an allergy  . Tramadol Other (See Comments)    Unknown     Medications:  Prior to Admission medications   Medication Sig Start Date End Date Taking? Authorizing Provider  aspirin 81 MG tablet Take 81 mg by mouth 2 (two) times daily.    Yes Historical Provider, MD  buPROPion (WELLBUTRIN XL) 300 MG 24 hr tablet Take 300 mg by mouth daily.     Yes Historical Provider, MD  carvedilol (COREG) 25 MG tablet TAKE 1 TABLET BY MOUTH TWICE A DAY WITH A MEAL 02/08/15  Yes Deboraha Sprang, MD  cyclobenzaprine (FLEXERIL) 5 MG tablet Take 5 mg by mouth 3 (three) times daily as needed.  07/29/14  Yes Historical Provider, MD  escitalopram (LEXAPRO) 10 MG tablet Take 10 mg by mouth at bedtime.    Yes Historical Provider, MD  famotidine (PEPCID) 20 MG tablet Take 20 mg by mouth at bedtime.    Yes Historical Provider, MD  gabapentin (NEURONTIN) 300 MG capsule Take 300 mg by mouth 2 (two) times daily as needed (  leg/shoulder pain).    Yes Historical Provider, MD  hydrALAZINE (APRESOLINE) 10 MG tablet Take 20 mg by mouth 3 (three) times daily. 09/16/14  Yes Historical Provider, MD  isosorbide mononitrate (IMDUR) 60 MG 24 hr tablet Take 1 tablet (60 mg total) by mouth daily. At bedtime 12/15/13  Yes Deboraha Sprang, MD  nitroGLYCERIN (NITROSTAT) 0.4 MG SL tablet Place 0.4 mg under the tongue every 5 (five) minutes as needed for chest pain.   Yes Historical Provider, MD  oxyCODONE-acetaminophen (PERCOCET) 10-325 MG per tablet Take 1 tablet by mouth every 4 (four) hours as needed for pain.   Yes Historical Provider, MD  pantoprazole (PROTONIX) 40 MG tablet Take 40 mg by mouth 2 (two) times daily.    Yes Historical Provider, MD  rosuvastatin (CRESTOR) 20 MG tablet Take 20 mg by mouth daily.     Yes Historical  Provider, MD  sacubitril-valsartan (ENTRESTO) 97-103 MG Take 1 tablet by mouth 2 (two) times daily.   Yes Historical Provider, MD  traZODone (DESYREL) 50 MG tablet Take 50 mg by mouth at bedtime as needed for sleep.   Yes Historical Provider, MD    ROS:  Out of a complete 14 system review of symptoms, the patient complains only of the following symptoms, and all other reviewed systems are negative.  Leg swelling Restless legs Back pain Speech difficulty  Blood pressure 149/93, pulse 73, height 5\' 10"  (1.778 m), weight 174 lb 8 oz (79.153 kg).  Physical Exam  General: The patient is alert and cooperative at the time of the examination.  Skin: No significant peripheral edema is noted.   Neurologic Exam  Mental status: The patient is alert and oriented x 3 at the time of the examination. The patient has apparent normal recent and remote memory, with an apparently normal attention span and concentration ability.   Cranial nerves: Facial symmetry is present. Speech is normal, no aphasia or dysarthria is noted. Extraocular movements are full. Visual fields are full.  Motor: The patient has good strength in all 4 extremities.  Sensory examination: Soft touch sensation is symmetric on the face, arms, and legs.  Coordination: The patient has good finger-nose-finger and heel-to-shin bilaterally.  Gait and station: The patient has a normal gait. Tandem gait is normal. Romberg is negative. No drift is seen.  Reflexes: Deep tendon reflexes are symmetric.   Assessment/Plan:  1. Syncope  2. Gait instability  3. Left heel discomfort  The patient could have restless leg syndrome involving the left leg, but he also has some tenderness of the Achilles tendon insertion point suggestive of a low-grade tendinitis. The patient may take anti-inflammatory medications over the next 4-6 weeks. If the restless legs type symptoms continue, we may add low-dose Requip, otherwise the patient will  follow-up through this office on an as-needed basis. He seems to be doing quite well with his walking at this time.  Jill Alexanders MD 08/16/2015 6:55 PM  [facility] [address] [place], [place] [address]  Phone [phone] Fax [phone]

## 2015-08-16 NOTE — Patient Instructions (Signed)
Fall Prevention in the Home  Falls can cause injuries and can affect people from all age groups. There are many simple things that you can do to make your home safe and to help prevent falls. WHAT CAN I DO ON THE OUTSIDE OF MY HOME?  Regularly repair the edges of walkways and driveways and fix any cracks.  Remove high doorway thresholds.  Trim any shrubbery on the main path into your home.  Use bright outdoor lighting.  Clear walkways of debris and clutter, including tools and rocks.  Regularly check that handrails are securely fastened and in good repair. Both sides of any steps should have handrails.  Install guardrails along the edges of any raised decks or porches.  Have leaves, snow, and ice cleared regularly.  Use sand or salt on walkways during winter months.  In the garage, clean up any spills right away, including grease or oil spills. WHAT CAN I DO IN THE BATHROOM?  Use night lights.  Install grab bars by the toilet and in the tub and shower. Do not use towel bars as grab bars.  Use non-skid mats or decals on the floor of the tub or shower.  If you need to sit down while you are in the shower, use a plastic, non-slip stool..  Keep the floor dry. Immediately clean up any water that spills on the floor.  Remove soap buildup in the tub or shower on a regular basis.  Attach bath mats securely with double-sided non-slip rug tape.  Remove throw rugs and other tripping hazards from the floor. WHAT CAN I DO IN THE BEDROOM?  Use night lights.  Make sure that a bedside light is easy to reach.  Do not use oversized bedding that drapes onto the floor.  Have a firm chair that has side arms to use for getting dressed.  Remove throw rugs and other tripping hazards from the floor. WHAT CAN I DO IN THE KITCHEN?   Clean up any spills right away.  Avoid walking on wet floors.  Place frequently used items in easy-to-reach places.  If you need to reach for something  above you, use a sturdy step stool that has a grab bar.  Keep electrical cables out of the way.  Do not use floor polish or wax that makes floors slippery. If you have to use wax, make sure that it is non-skid floor wax.  Remove throw rugs and other tripping hazards from the floor. WHAT CAN I DO IN THE STAIRWAYS?  Do not leave any items on the stairs.  Make sure that there are handrails on both sides of the stairs. Fix handrails that are broken or loose. Make sure that handrails are as long as the stairways.  Check any carpeting to make sure that it is firmly attached to the stairs. Fix any carpet that is loose or worn.  Avoid having throw rugs at the top or bottom of stairways, or secure the rugs with carpet tape to prevent them from moving.  Make sure that you have a light switch at the top of the stairs and the bottom of the stairs. If you do not have them, have them installed. WHAT ARE SOME OTHER FALL PREVENTION TIPS?  Wear closed-toe shoes that fit well and support your feet. Wear shoes that have rubber soles or low heels.  When you use a stepladder, make sure that it is completely opened and that the sides are firmly locked. Have someone hold the ladder while you   are using it. Do not climb a closed stepladder.  Add color or contrast paint or tape to grab bars and handrails in your home. Place contrasting color strips on the first and last steps.  Use mobility aids as needed, such as canes, walkers, scooters, and crutches.  Turn on lights if it is dark. Replace any light bulbs that burn out.  Set up furniture so that there are clear paths. Keep the furniture in the same spot.  Fix any uneven floor surfaces.  Choose a carpet design that does not hide the edge of steps of a stairway.  Be aware of any and all pets.  Review your medicines with your healthcare provider. Some medicines can cause dizziness or changes in blood pressure, which increase your risk of falling. Talk  with your health care provider about other ways that you can decrease your risk of falls. This may include working with a physical therapist or trainer to improve your strength, balance, and endurance.   This information is not intended to replace advice given to you by your health care provider. Make sure you discuss any questions you have with your health care provider.   Document Released: 10/10/2002 Document Revised: 03/06/2015 Document Reviewed: 11/24/2014 Elsevier Interactive Patient Education 2016 [employer].  

## 2015-09-05 ENCOUNTER — Other Ambulatory Visit: Payer: Self-pay | Admitting: Cardiology

## 2015-09-05 DIAGNOSIS — I209 Angina pectoris, unspecified: Secondary | ICD-10-CM | POA: Diagnosis not present

## 2015-09-05 DIAGNOSIS — I1310 Hypertensive heart and chronic kidney disease without heart failure, with stage 1 through stage 4 chronic kidney disease, or unspecified chronic kidney disease: Secondary | ICD-10-CM | POA: Diagnosis not present

## 2015-09-05 DIAGNOSIS — I2510 Atherosclerotic heart disease of native coronary artery without angina pectoris: Secondary | ICD-10-CM | POA: Diagnosis not present

## 2015-09-05 DIAGNOSIS — E785 Hyperlipidemia, unspecified: Secondary | ICD-10-CM | POA: Diagnosis not present

## 2015-09-05 DIAGNOSIS — R079 Chest pain, unspecified: Secondary | ICD-10-CM

## 2015-09-05 DIAGNOSIS — I255 Ischemic cardiomyopathy: Secondary | ICD-10-CM | POA: Diagnosis not present

## 2015-09-05 DIAGNOSIS — N189 Chronic kidney disease, unspecified: Secondary | ICD-10-CM | POA: Diagnosis not present

## 2015-09-12 ENCOUNTER — Encounter (HOSPITAL_COMMUNITY)
Admission: RE | Admit: 2015-09-12 | Discharge: 2015-09-12 | Disposition: A | Discharge status: Home / Self Care | Payer: Medicare Other | Source: Ambulatory Visit | Attending: Cardiology | Admitting: Cardiology

## 2015-09-12 DIAGNOSIS — R079 Chest pain, unspecified: Secondary | ICD-10-CM | POA: Diagnosis not present

## 2015-09-12 DIAGNOSIS — E785 Hyperlipidemia, unspecified: Secondary | ICD-10-CM | POA: Diagnosis not present

## 2015-09-12 DIAGNOSIS — I1310 Hypertensive heart and chronic kidney disease without heart failure, with stage 1 through stage 4 chronic kidney disease, or unspecified chronic kidney disease: Secondary | ICD-10-CM | POA: Diagnosis not present

## 2015-09-12 DIAGNOSIS — I2510 Atherosclerotic heart disease of native coronary artery without angina pectoris: Secondary | ICD-10-CM | POA: Diagnosis not present

## 2015-09-12 DIAGNOSIS — I209 Angina pectoris, unspecified: Secondary | ICD-10-CM | POA: Diagnosis not present

## 2015-09-12 DIAGNOSIS — I255 Ischemic cardiomyopathy: Secondary | ICD-10-CM | POA: Diagnosis not present

## 2015-09-12 MED ORDER — TECHNETIUM TC 99M SESTAMIBI GENERIC - CARDIOLITE
10.0000 | Freq: Once | INTRAVENOUS | Status: AC | PRN
  Administered 2015-09-12: 10 via INTRAVENOUS

## 2015-09-12 MED ORDER — TECHNETIUM TC 99M SESTAMIBI GENERIC - CARDIOLITE
30.0000 | Freq: Once | INTRAVENOUS | Status: AC | PRN
  Administered 2015-09-12: 30 via INTRAVENOUS

## 2015-09-12 MED ORDER — REGADENOSON 0.4 MG/5ML IV SOLN
INTRAVENOUS | Status: AC
  Filled 2015-09-12: qty 5

## 2015-09-12 MED ORDER — REGADENOSON 0.4 MG/5ML IV SOLN
0.4000 mg | Freq: Once | INTRAVENOUS | Status: AC
  Administered 2015-09-12: 0.4 mg via INTRAVENOUS

## 2015-09-18 DIAGNOSIS — M7662 Achilles tendinitis, left leg: Secondary | ICD-10-CM | POA: Diagnosis not present

## 2015-09-18 DIAGNOSIS — R7301 Impaired fasting glucose: Secondary | ICD-10-CM | POA: Diagnosis not present

## 2015-09-18 DIAGNOSIS — I10 Essential (primary) hypertension: Secondary | ICD-10-CM | POA: Diagnosis not present

## 2015-09-18 DIAGNOSIS — E785 Hyperlipidemia, unspecified: Secondary | ICD-10-CM | POA: Diagnosis not present

## 2015-09-18 DIAGNOSIS — I2510 Atherosclerotic heart disease of native coronary artery without angina pectoris: Secondary | ICD-10-CM | POA: Diagnosis not present

## 2015-09-18 DIAGNOSIS — R5383 Other fatigue: Secondary | ICD-10-CM | POA: Diagnosis not present

## 2015-09-24 ENCOUNTER — Ambulatory Visit (INDEPENDENT_AMBULATORY_CARE_PROVIDER_SITE_OTHER): Payer: Medicare Other | Admitting: *Deleted

## 2015-09-24 DIAGNOSIS — I255 Ischemic cardiomyopathy: Secondary | ICD-10-CM | POA: Diagnosis not present

## 2015-09-24 NOTE — Progress Notes (Signed)
Remote ICD transmission.   

## 2015-09-25 LAB — CUP PACEART REMOTE DEVICE CHECK
Battery Remaining Longevity: 120 mo
Battery Remaining Percentage: 100 %
Brady Statistic RA Percent Paced: 61 %
Brady Statistic RV Percent Paced: 0 %
Date Time Interrogation Session: 20161121054100
HighPow Impedance: 43 Ohm
Implantable Lead Implant Date: 20060512
Implantable Lead Location: 753859
Implantable Lead Location: 753860
Implantable Lead Model: 5076
Implantable Lead Serial Number: 159477
Lead Channel Impedance Value: 504 Ohm
Lead Channel Sensing Intrinsic Amplitude: 2.1 mV
Lead Channel Setting Pacing Amplitude: 2 V
Lead Channel Setting Pacing Amplitude: 2.4 V
Lead Channel Setting Pacing Pulse Width: 0.4 ms
Lead Channel Setting Sensing Sensitivity: 0.6 mV
MDC IDC LEAD IMPLANT DT: 20060512
MDC IDC LEAD MODEL: 158
MDC IDC MSMT LEADCHNL RA IMPEDANCE VALUE: 398 Ohm
MDC IDC MSMT LEADCHNL RV SENSING INTR AMPL: 9.7 mV
MDC IDC PG SERIAL: 112776

## 2015-09-26 ENCOUNTER — Encounter: Payer: Self-pay | Admitting: Cardiology

## 2015-10-10 DIAGNOSIS — Z4789 Encounter for other orthopedic aftercare: Secondary | ICD-10-CM | POA: Diagnosis not present

## 2015-10-11 DIAGNOSIS — L219 Seborrheic dermatitis, unspecified: Secondary | ICD-10-CM | POA: Diagnosis not present

## 2015-11-22 DIAGNOSIS — L570 Actinic keratosis: Secondary | ICD-10-CM | POA: Diagnosis not present

## 2015-11-22 DIAGNOSIS — L578 Other skin changes due to chronic exposure to nonionizing radiation: Secondary | ICD-10-CM | POA: Diagnosis not present

## 2015-11-22 DIAGNOSIS — L219 Seborrheic dermatitis, unspecified: Secondary | ICD-10-CM | POA: Diagnosis not present

## 2015-11-22 DIAGNOSIS — M7662 Achilles tendinitis, left leg: Secondary | ICD-10-CM | POA: Diagnosis not present

## 2015-11-28 DIAGNOSIS — M7662 Achilles tendinitis, left leg: Secondary | ICD-10-CM | POA: Diagnosis not present

## 2015-12-05 DIAGNOSIS — I119 Hypertensive heart disease without heart failure: Secondary | ICD-10-CM | POA: Diagnosis not present

## 2015-12-05 DIAGNOSIS — F17290 Nicotine dependence, other tobacco product, uncomplicated: Secondary | ICD-10-CM | POA: Diagnosis not present

## 2015-12-05 DIAGNOSIS — I10 Essential (primary) hypertension: Secondary | ICD-10-CM | POA: Diagnosis not present

## 2015-12-05 DIAGNOSIS — I2510 Atherosclerotic heart disease of native coronary artery without angina pectoris: Secondary | ICD-10-CM | POA: Diagnosis not present

## 2015-12-05 DIAGNOSIS — E785 Hyperlipidemia, unspecified: Secondary | ICD-10-CM | POA: Diagnosis not present

## 2015-12-05 DIAGNOSIS — I25110 Atherosclerotic heart disease of native coronary artery with unstable angina pectoris: Secondary | ICD-10-CM | POA: Diagnosis not present

## 2015-12-05 DIAGNOSIS — I252 Old myocardial infarction: Secondary | ICD-10-CM | POA: Diagnosis not present

## 2015-12-05 DIAGNOSIS — I255 Ischemic cardiomyopathy: Secondary | ICD-10-CM | POA: Diagnosis not present

## 2015-12-05 DIAGNOSIS — R9430 Abnormal result of cardiovascular function study, unspecified: Secondary | ICD-10-CM | POA: Diagnosis not present

## 2015-12-10 ENCOUNTER — Observation Stay (HOSPITAL_COMMUNITY)
Admission: AD | Admit: 2015-12-10 | Discharge: 2015-12-12 | Disposition: A | Discharge status: Home / Self Care | Payer: Medicare Other | Source: Ambulatory Visit | Attending: Cardiology | Admitting: Cardiology

## 2015-12-10 ENCOUNTER — Encounter (HOSPITAL_COMMUNITY): Payer: Self-pay | Admitting: General Practice

## 2015-12-10 DIAGNOSIS — I200 Unstable angina: Secondary | ICD-10-CM | POA: Diagnosis not present

## 2015-12-10 DIAGNOSIS — E785 Hyperlipidemia, unspecified: Secondary | ICD-10-CM | POA: Insufficient documentation

## 2015-12-10 DIAGNOSIS — I1310 Hypertensive heart and chronic kidney disease without heart failure, with stage 1 through stage 4 chronic kidney disease, or unspecified chronic kidney disease: Secondary | ICD-10-CM | POA: Diagnosis not present

## 2015-12-10 DIAGNOSIS — I255 Ischemic cardiomyopathy: Secondary | ICD-10-CM | POA: Insufficient documentation

## 2015-12-10 DIAGNOSIS — Z79899 Other long term (current) drug therapy: Secondary | ICD-10-CM | POA: Diagnosis not present

## 2015-12-10 DIAGNOSIS — Z95810 Presence of automatic (implantable) cardiac defibrillator: Secondary | ICD-10-CM | POA: Insufficient documentation

## 2015-12-10 DIAGNOSIS — N183 Chronic kidney disease, stage 3 (moderate): Secondary | ICD-10-CM | POA: Insufficient documentation

## 2015-12-10 DIAGNOSIS — I2582 Chronic total occlusion of coronary artery: Secondary | ICD-10-CM | POA: Diagnosis not present

## 2015-12-10 DIAGNOSIS — F329 Major depressive disorder, single episode, unspecified: Secondary | ICD-10-CM | POA: Insufficient documentation

## 2015-12-10 DIAGNOSIS — Z951 Presence of aortocoronary bypass graft: Secondary | ICD-10-CM | POA: Diagnosis not present

## 2015-12-10 DIAGNOSIS — R9430 Abnormal result of cardiovascular function study, unspecified: Secondary | ICD-10-CM | POA: Diagnosis not present

## 2015-12-10 DIAGNOSIS — Z87891 Personal history of nicotine dependence: Secondary | ICD-10-CM | POA: Diagnosis not present

## 2015-12-10 DIAGNOSIS — I4891 Unspecified atrial fibrillation: Secondary | ICD-10-CM | POA: Diagnosis not present

## 2015-12-10 DIAGNOSIS — I5022 Chronic systolic (congestive) heart failure: Secondary | ICD-10-CM | POA: Diagnosis not present

## 2015-12-10 DIAGNOSIS — I25118 Atherosclerotic heart disease of native coronary artery with other forms of angina pectoris: Principal | ICD-10-CM | POA: Insufficient documentation

## 2015-12-10 DIAGNOSIS — I2510 Atherosclerotic heart disease of native coronary artery without angina pectoris: Secondary | ICD-10-CM | POA: Diagnosis not present

## 2015-12-10 DIAGNOSIS — Z7902 Long term (current) use of antithrombotics/antiplatelets: Secondary | ICD-10-CM | POA: Insufficient documentation

## 2015-12-10 DIAGNOSIS — I130 Hypertensive heart and chronic kidney disease with heart failure and stage 1 through stage 4 chronic kidney disease, or unspecified chronic kidney disease: Secondary | ICD-10-CM | POA: Insufficient documentation

## 2015-12-10 DIAGNOSIS — R7303 Prediabetes: Secondary | ICD-10-CM | POA: Diagnosis not present

## 2015-12-10 DIAGNOSIS — R9439 Abnormal result of other cardiovascular function study: Secondary | ICD-10-CM | POA: Diagnosis present

## 2015-12-10 DIAGNOSIS — I739 Peripheral vascular disease, unspecified: Secondary | ICD-10-CM | POA: Insufficient documentation

## 2015-12-10 DIAGNOSIS — Z7982 Long term (current) use of aspirin: Secondary | ICD-10-CM | POA: Insufficient documentation

## 2015-12-10 DIAGNOSIS — I252 Old myocardial infarction: Secondary | ICD-10-CM | POA: Diagnosis not present

## 2015-12-10 DIAGNOSIS — Z8249 Family history of ischemic heart disease and other diseases of the circulatory system: Secondary | ICD-10-CM | POA: Diagnosis not present

## 2015-12-10 LAB — CBC WITH DIFFERENTIAL/PLATELET
BASOS ABS: 0 10*3/uL (ref 0.0–0.1)
Basophils Relative: 1 %
EOS ABS: 0.2 10*3/uL (ref 0.0–0.7)
EOS PCT: 3 %
HCT: 42.4 % (ref 39.0–52.0)
Hemoglobin: 13.7 g/dL (ref 13.0–17.0)
Lymphocytes Relative: 27 %
Lymphs Abs: 1.8 10*3/uL (ref 0.7–4.0)
MCH: 31.9 pg (ref 26.0–34.0)
MCHC: 32.3 g/dL (ref 30.0–36.0)
MCV: 98.8 fL (ref 78.0–100.0)
Monocytes Absolute: 0.7 10*3/uL (ref 0.1–1.0)
Monocytes Relative: 10 %
Neutro Abs: 3.9 10*3/uL (ref 1.7–7.7)
Neutrophils Relative %: 59 %
PLATELETS: 175 10*3/uL (ref 150–400)
RBC: 4.29 MIL/uL (ref 4.22–5.81)
RDW: 12.5 % (ref 11.5–15.5)
WBC: 6.6 10*3/uL (ref 4.0–10.5)

## 2015-12-10 LAB — BASIC METABOLIC PANEL
Anion gap: 11 (ref 5–15)
BUN: 24 mg/dL — AB (ref 6–20)
CHLORIDE: 100 mmol/L — AB (ref 101–111)
CO2: 26 mmol/L (ref 22–32)
CREATININE: 1.51 mg/dL — AB (ref 0.61–1.24)
Calcium: 9.1 mg/dL (ref 8.9–10.3)
GFR calc non Af Amer: 44 mL/min — ABNORMAL LOW (ref >60)
GFR, EST AFRICAN AMERICAN: 51 mL/min — AB (ref >60)
Glucose, Bld: 114 mg/dL — ABNORMAL HIGH (ref 65–99)
POTASSIUM: 4.5 mmol/L (ref 3.5–5.1)
SODIUM: 137 mmol/L (ref 135–145)

## 2015-12-10 LAB — TROPONIN I: Troponin I: <0.03 ng/mL (ref <0.031)

## 2015-12-10 MED ORDER — HEPARIN (PORCINE) IN NACL 100-0.45 UNIT/ML-% IJ SOLN
850.0000 [IU]/h | INTRAMUSCULAR | Status: DC
  Administered 2015-12-10: 950 [IU]/h via INTRAVENOUS
  Filled 2015-12-10: qty 250

## 2015-12-10 MED ORDER — CARVEDILOL 25 MG PO TABS
25.0000 mg | ORAL_TABLET | Freq: Two times a day (BID) | ORAL | Status: DC
  Administered 2015-12-11 – 2015-12-12 (×3): 25 mg via ORAL
  Filled 2015-12-10 (×3): qty 1

## 2015-12-10 MED ORDER — ASPIRIN 81 MG PO TABS: 81.0000 mg | ORAL_TABLET | Freq: Two times a day (BID) | ORAL | Status: DC

## 2015-12-10 MED ORDER — SODIUM CHLORIDE 0.9 % WEIGHT BASED INFUSION
1.0000 mL/kg/h | INTRAVENOUS | Status: DC
  Administered 2015-12-10: 1 mL/kg/h via INTRAVENOUS

## 2015-12-10 MED ORDER — HYDRALAZINE HCL 20 MG/ML IJ SOLN
INTRAMUSCULAR | Status: AC
  Filled 2015-12-10: qty 1

## 2015-12-10 MED ORDER — FAMOTIDINE 20 MG PO TABS
20.0000 mg | ORAL_TABLET | Freq: Every day | ORAL | Status: DC
  Administered 2015-12-10 – 2015-12-11 (×2): 20 mg via ORAL
  Filled 2015-12-10 (×2): qty 1

## 2015-12-10 MED ORDER — SACUBITRIL-VALSARTAN 24-26 MG PO TABS
1.0000 | ORAL_TABLET | Freq: Two times a day (BID) | ORAL | Status: DC
  Administered 2015-12-10 – 2015-12-12 (×4): 1 via ORAL
  Filled 2015-12-10 (×6): qty 1

## 2015-12-10 MED ORDER — ACETAMINOPHEN 325 MG PO TABS: 650.0000 mg | ORAL_TABLET | ORAL | Status: DC | PRN

## 2015-12-10 MED ORDER — SODIUM CHLORIDE 0.9% FLUSH: 3.0000 mL | INTRAVENOUS | Status: DC | PRN

## 2015-12-10 MED ORDER — ASPIRIN 300 MG RE SUPP: 300.0000 mg | RECTAL | Status: AC

## 2015-12-10 MED ORDER — CLOPIDOGREL BISULFATE 75 MG PO TABS
75.0000 mg | ORAL_TABLET | Freq: Every day | ORAL | Status: DC
  Administered 2015-12-10 – 2015-12-12 (×3): 75 mg via ORAL
  Filled 2015-12-10 (×3): qty 1

## 2015-12-10 MED ORDER — BUPROPION HCL ER (XL) 300 MG PO TB24
300.0000 mg | ORAL_TABLET | Freq: Every day | ORAL | Status: DC
  Administered 2015-12-11 – 2015-12-12 (×2): 300 mg via ORAL
  Filled 2015-12-10 (×2): qty 1

## 2015-12-10 MED ORDER — ESCITALOPRAM OXALATE 10 MG PO TABS
10.0000 mg | ORAL_TABLET | Freq: Every day | ORAL | Status: DC
  Administered 2015-12-10 – 2015-12-11 (×2): 10 mg via ORAL
  Filled 2015-12-10 (×2): qty 1

## 2015-12-10 MED ORDER — SODIUM CHLORIDE 0.9% FLUSH: 3.0000 mL | Freq: Two times a day (BID) | INTRAVENOUS | Status: DC

## 2015-12-10 MED ORDER — NITROGLYCERIN 0.4 MG SL SUBL: 0.4000 mg | SUBLINGUAL_TABLET | SUBLINGUAL | Status: DC | PRN

## 2015-12-10 MED ORDER — HYDRALAZINE HCL 20 MG/ML IJ SOLN
10.0000 mg | Freq: Four times a day (QID) | INTRAMUSCULAR | Status: DC | PRN
  Administered 2015-12-10 – 2015-12-11 (×2): 10 mg via INTRAVENOUS
  Filled 2015-12-10 (×2): qty 1

## 2015-12-10 MED ORDER — SODIUM CHLORIDE 0.9 % IV SOLN: 250.0000 mL | INTRAVENOUS | Status: DC | PRN

## 2015-12-10 MED ORDER — NITROGLYCERIN IN D5W 200-5 MCG/ML-% IV SOLN
10.0000 ug/min | INTRAVENOUS | Status: DC
  Administered 2015-12-10: 10 ug/min via INTRAVENOUS
  Filled 2015-12-10: qty 250

## 2015-12-10 MED ORDER — PANTOPRAZOLE SODIUM 40 MG PO TBEC
40.0000 mg | DELAYED_RELEASE_TABLET | Freq: Two times a day (BID) | ORAL | Status: DC
  Administered 2015-12-10 – 2015-12-12 (×4): 40 mg via ORAL
  Filled 2015-12-10 (×4): qty 1

## 2015-12-10 MED ORDER — GABAPENTIN 300 MG PO CAPS
300.0000 mg | ORAL_CAPSULE | Freq: Two times a day (BID) | ORAL | Status: DC | PRN
  Administered 2015-12-11: 300 mg via ORAL
  Filled 2015-12-10: qty 1

## 2015-12-10 MED ORDER — TRAZODONE HCL 50 MG PO TABS
50.0000 mg | ORAL_TABLET | Freq: Every evening | ORAL | Status: DC | PRN
  Administered 2015-12-10: 50 mg via ORAL
  Filled 2015-12-10: qty 1

## 2015-12-10 MED ORDER — ASPIRIN EC 81 MG PO TBEC
81.0000 mg | DELAYED_RELEASE_TABLET | Freq: Every day | ORAL | Status: DC
  Administered 2015-12-11 – 2015-12-12 (×2): 81 mg via ORAL
  Filled 2015-12-10 (×2): qty 1

## 2015-12-10 MED ORDER — ASPIRIN 81 MG PO CHEW
324.0000 mg | CHEWABLE_TABLET | ORAL | Status: AC
Start: 2015-12-10 — End: 2015-12-10
  Administered 2015-12-10: 324 mg via ORAL
  Filled 2015-12-10: qty 4

## 2015-12-10 MED ORDER — HEPARIN BOLUS VIA INFUSION
4000.0000 [IU] | Freq: Once | INTRAVENOUS | Status: AC
  Administered 2015-12-10: 4000 [IU] via INTRAVENOUS
  Filled 2015-12-10: qty 4000

## 2015-12-10 MED ORDER — ONDANSETRON HCL 4 MG/2ML IJ SOLN: 4.0000 mg | Freq: Four times a day (QID) | INTRAMUSCULAR | Status: DC | PRN

## 2015-12-10 MED ORDER — SODIUM CHLORIDE 0.9 % IV SOLN: INTRAVENOUS | Status: DC

## 2015-12-10 MED ORDER — ROSUVASTATIN CALCIUM 40 MG PO TABS
20.0000 mg | ORAL_TABLET | Freq: Every day | ORAL | Status: DC
  Administered 2015-12-10 – 2015-12-11 (×2): 20 mg via ORAL
  Filled 2015-12-10 (×2): qty 1

## 2015-12-10 NOTE — Consult Note (Signed)
ANTICOAGULATION CONSULT NOTE - Initial Consult  Pharmacy Consult for Heparin Indication: chest pain/ACS  Allergies  Allergen Reactions  . Amlodipine     Pt not sure if this is an allergy  . Tramadol Other (See Comments)    Unknown     Patient Measurements: Height: 5\' 10"  (177.8 cm) Weight: 174 lb 11.2 oz (79.243 kg) IBW/kg (Calculated) : 73  Vital Signs: Temp: 97.6 F (36.4 C) (02/06 1738) Temp Source: Oral (02/06 1738) BP: 202/100 mmHg (02/06 1755) Pulse Rate: 58 (02/06 1738)  Labs: No results for input(s): HGB, HCT, PLT, APTT, LABPROT, INR, HEPARINUNFRC, HEPRLOWMOCWT, CREATININE, CKTOTAL, CKMB, TROPONINI in the last 72 hours.  CrCl cannot be calculated (Patient has no serum creatinine result on file.).   Medical History: Past Medical History  Diagnosis Date  . Dyspnea   . Ischemic cardiomyopathy   . ICD (implantable cardiac defibrillator)  BSx   . Atrial fibrillation ([place])   . Myocardial infarction ([place])   . Peripheral vascular disease ([place])   . Near syncope 09/21/2014  . Abnormality of gait 02/07/2015    Medications:  No anticoagulants pta  Assessment: 74yom being admitted with unstable angina to begin IV heparin with plans for cath tomorrow. Baseline labs pending.  Goal of Therapy:  Heparin level 0.3-0.7 units/ml Monitor platelets by anticoagulation protocol: Yes   Plan:  1) Heparin bolus 4000 units x 1 2) Heparin drip at 950 units/hr 3) Check 8 hour heparin level 4) Follow up admission labs  Deboraha Sprang 12/10/2015,6:08 PM

## 2015-12-10 NOTE — Progress Notes (Signed)
Pt admitted to 2w34 from doctors office. Pt and wife oriented to room. Pt placed on tele. MD notified of pts blood pressure. Phone and call bell within reach, will continue to monitor.

## 2015-12-10 NOTE — H&P (Signed)
Printed H&P in the chart needs to be scanned 

## 2015-12-11 ENCOUNTER — Encounter (HOSPITAL_COMMUNITY)
Admission: AD | Disposition: A | Discharge status: Home / Self Care | Payer: Self-pay | Source: Ambulatory Visit | Attending: Cardiology

## 2015-12-11 DIAGNOSIS — I200 Unstable angina: Secondary | ICD-10-CM | POA: Diagnosis not present

## 2015-12-11 DIAGNOSIS — I130 Hypertensive heart and chronic kidney disease with heart failure and stage 1 through stage 4 chronic kidney disease, or unspecified chronic kidney disease: Secondary | ICD-10-CM | POA: Diagnosis not present

## 2015-12-11 DIAGNOSIS — I4891 Unspecified atrial fibrillation: Secondary | ICD-10-CM | POA: Diagnosis not present

## 2015-12-11 DIAGNOSIS — I1310 Hypertensive heart and chronic kidney disease without heart failure, with stage 1 through stage 4 chronic kidney disease, or unspecified chronic kidney disease: Secondary | ICD-10-CM | POA: Diagnosis not present

## 2015-12-11 DIAGNOSIS — I739 Peripheral vascular disease, unspecified: Secondary | ICD-10-CM | POA: Diagnosis not present

## 2015-12-11 DIAGNOSIS — I255 Ischemic cardiomyopathy: Secondary | ICD-10-CM | POA: Diagnosis not present

## 2015-12-11 DIAGNOSIS — I25118 Atherosclerotic heart disease of native coronary artery with other forms of angina pectoris: Secondary | ICD-10-CM | POA: Diagnosis not present

## 2015-12-11 DIAGNOSIS — I2582 Chronic total occlusion of coronary artery: Secondary | ICD-10-CM | POA: Diagnosis not present

## 2015-12-11 DIAGNOSIS — R9430 Abnormal result of cardiovascular function study, unspecified: Secondary | ICD-10-CM | POA: Diagnosis not present

## 2015-12-11 DIAGNOSIS — I2510 Atherosclerotic heart disease of native coronary artery without angina pectoris: Secondary | ICD-10-CM | POA: Diagnosis not present

## 2015-12-11 DIAGNOSIS — Z87891 Personal history of nicotine dependence: Secondary | ICD-10-CM | POA: Diagnosis not present

## 2015-12-11 DIAGNOSIS — N183 Chronic kidney disease, stage 3 (moderate): Secondary | ICD-10-CM | POA: Diagnosis not present

## 2015-12-11 DIAGNOSIS — I252 Old myocardial infarction: Secondary | ICD-10-CM | POA: Diagnosis not present

## 2015-12-11 HISTORY — PX: CARDIAC CATHETERIZATION: SHX172

## 2015-12-11 LAB — BASIC METABOLIC PANEL
Anion gap: 12 (ref 5–15)
BUN: 23 mg/dL — ABNORMAL HIGH (ref 6–20)
CALCIUM: 9.2 mg/dL (ref 8.9–10.3)
CO2: 25 mmol/L (ref 22–32)
CREATININE: 1.45 mg/dL — AB (ref 0.61–1.24)
Chloride: 105 mmol/L (ref 101–111)
GFR calc non Af Amer: 46 mL/min — ABNORMAL LOW (ref >60)
GFR, EST AFRICAN AMERICAN: 53 mL/min — AB (ref >60)
Glucose, Bld: 118 mg/dL — ABNORMAL HIGH (ref 65–99)
Potassium: 3.6 mmol/L (ref 3.5–5.1)
SODIUM: 142 mmol/L (ref 135–145)

## 2015-12-11 LAB — CBC
HCT: 42.1 % (ref 39.0–52.0)
Hemoglobin: 14 g/dL (ref 13.0–17.0)
MCH: 32.3 pg (ref 26.0–34.0)
MCHC: 33.3 g/dL (ref 30.0–36.0)
MCV: 97 fL (ref 78.0–100.0)
PLATELETS: 180 10*3/uL (ref 150–400)
RBC: 4.34 MIL/uL (ref 4.22–5.81)
RDW: 12.6 % (ref 11.5–15.5)
WBC: 8.4 10*3/uL (ref 4.0–10.5)

## 2015-12-11 LAB — LIPID PANEL
CHOLESTEROL: 156 mg/dL (ref 0–200)
HDL: 47 mg/dL (ref >40)
LDL Cholesterol: 87 mg/dL (ref 0–99)
Total CHOL/HDL Ratio: 3.3 RATIO
Triglycerides: 112 mg/dL (ref <150)
VLDL: 22 mg/dL (ref 0–40)

## 2015-12-11 LAB — PROTIME-INR
INR: 1.15 (ref 0.00–1.49)
Prothrombin Time: 14.9 seconds (ref 11.6–15.2)

## 2015-12-11 LAB — HEPARIN LEVEL (UNFRACTIONATED): Heparin Unfractionated: 0.77 IU/mL — ABNORMAL HIGH (ref 0.30–0.70)

## 2015-12-11 LAB — TROPONIN I: Troponin I: <0.03 ng/mL (ref <0.031)

## 2015-12-11 SURGERY — LEFT HEART CATH AND CORONARY ANGIOGRAPHY
Anesthesia: LOCAL

## 2015-12-11 MED ORDER — NITROGLYCERIN 1 MG/10 ML FOR IR/CATH LAB
INTRA_ARTERIAL | Status: DC | PRN
  Administered 2015-12-11: 150 ug via INTRACORONARY

## 2015-12-11 MED ORDER — SODIUM CHLORIDE 0.9 % IV SOLN
INTRAVENOUS | Status: AC
  Administered 2015-12-11: 150 mL/h via INTRAVENOUS

## 2015-12-11 MED ORDER — HEPARIN (PORCINE) IN NACL 2-0.9 UNIT/ML-% IJ SOLN
INTRAMUSCULAR | Status: DC | PRN
  Administered 2015-12-11: 1000 mL

## 2015-12-11 MED ORDER — SODIUM CHLORIDE 0.9% FLUSH: 3.0000 mL | Freq: Two times a day (BID) | INTRAVENOUS | Status: DC

## 2015-12-11 MED ORDER — IOHEXOL 350 MG/ML SOLN
INTRAVENOUS | Status: DC | PRN
  Administered 2015-12-11: 70 mL via INTRA_ARTERIAL

## 2015-12-11 MED ORDER — HYDRALAZINE HCL 20 MG/ML IJ SOLN
INTRAMUSCULAR | Status: DC | PRN
  Administered 2015-12-11 (×2): 10 mg via INTRAVENOUS

## 2015-12-11 MED ORDER — NITROGLYCERIN 1 MG/10 ML FOR IR/CATH LAB
INTRA_ARTERIAL | Status: AC
  Filled 2015-12-11: qty 10

## 2015-12-11 MED ORDER — HEPARIN (PORCINE) IN NACL 2-0.9 UNIT/ML-% IJ SOLN: INTRAMUSCULAR | Status: DC | PRN

## 2015-12-11 MED ORDER — MIDAZOLAM HCL 2 MG/2ML IJ SOLN
INTRAMUSCULAR | Status: DC | PRN
  Administered 2015-12-11 (×2): 1 mg via INTRAVENOUS

## 2015-12-11 MED ORDER — FENTANYL CITRATE (PF) 100 MCG/2ML IJ SOLN
INTRAMUSCULAR | Status: DC | PRN
  Administered 2015-12-11 (×2): 25 ug via INTRAVENOUS

## 2015-12-11 MED ORDER — FENTANYL CITRATE (PF) 100 MCG/2ML IJ SOLN
INTRAMUSCULAR | Status: AC
  Filled 2015-12-11: qty 2

## 2015-12-11 MED ORDER — LIDOCAINE HCL (PF) 1 % IJ SOLN
INTRAMUSCULAR | Status: AC
  Filled 2015-12-11: qty 30

## 2015-12-11 MED ORDER — SODIUM CHLORIDE 0.9 % IV SOLN: 250.0000 mL | INTRAVENOUS | Status: DC | PRN

## 2015-12-11 MED ORDER — NITROGLYCERIN IN D5W 200-5 MCG/ML-% IV SOLN
INTRAVENOUS | Status: DC | PRN
  Administered 2015-12-11: 20 ug/min via INTRAVENOUS

## 2015-12-11 MED ORDER — ACETAMINOPHEN 325 MG PO TABS: 650.0000 mg | ORAL_TABLET | ORAL | Status: DC | PRN

## 2015-12-11 MED ORDER — SODIUM CHLORIDE 0.9% FLUSH: 3.0000 mL | INTRAVENOUS | Status: DC | PRN

## 2015-12-11 MED ORDER — MIDAZOLAM HCL 2 MG/2ML IJ SOLN
INTRAMUSCULAR | Status: AC
  Filled 2015-12-11: qty 2

## 2015-12-11 MED ORDER — HYDRALAZINE HCL 20 MG/ML IJ SOLN
INTRAMUSCULAR | Status: AC
  Filled 2015-12-11: qty 1

## 2015-12-11 MED ORDER — SODIUM CHLORIDE 0.9 % IV SOLN
INTRAVENOUS | Status: DC | PRN
  Administered 2015-12-11: 250 mL via INTRAVENOUS

## 2015-12-11 MED ORDER — HEPARIN (PORCINE) IN NACL 2-0.9 UNIT/ML-% IJ SOLN
INTRAMUSCULAR | Status: AC
  Filled 2015-12-11: qty 1000

## 2015-12-11 SURGICAL SUPPLY — 9 items
CATH INFINITI 5 FR IM (CATHETERS) ×2 IMPLANT
CATH INFINITI 5FR MULTPACK ANG (CATHETERS) ×2 IMPLANT
KIT HEART LEFT (KITS) ×2 IMPLANT
PACK CARDIAC CATHETERIZATION (CUSTOM PROCEDURE TRAY) ×2 IMPLANT
SHEATH PINNACLE 5F 10CM (SHEATH) ×2 IMPLANT
SYR MEDRAD MARK V 150ML (SYRINGE) ×2 IMPLANT
TRANSDUCER W/STOPCOCK (MISCELLANEOUS) ×2 IMPLANT
WIRE EMERALD 3MM-J .035X150CM (WIRE) ×2 IMPLANT
WIRE EMERALD 3MM-J .035X260CM (WIRE) ×2 IMPLANT

## 2015-12-11 NOTE — Interval H&P Note (Signed)
Cath Lab Visit (complete for each Cath Lab visit)  Clinical Evaluation Leading to the Procedure:   ACS: No.  Non-ACS:    Anginal Classification: CCS III  Anti-ischemic medical therapy: Maximal Therapy (2 or more classes of medications)  Non-Invasive Test Results: Intermediate-risk stress test findings: cardiac mortality 1-3%/year  Prior CABG: Previous CABG      History and Physical Interval Note:  12/11/2015 2:27 PM  Connor Wilkerson  has presented today for surgery, with the diagnosis of cp  The various methods of treatment have been discussed with the patient and family. After consideration of risks, benefits and other options for treatment, the patient has consented to  Procedure(s): Left Heart Cath and Coronary Angiography (N/A) as a surgical intervention .  The patient's history has been reviewed, patient examined, no change in status, stable for surgery.  I have reviewed the patient's chart and labs.  Questions were answered to the patient's satisfaction.     Charolette Forward

## 2015-12-11 NOTE — Consult Note (Signed)
[place] for Heparin Indication: chest pain/ACS  Allergies  Allergen Reactions  . Amlodipine     Pt not sure if this is an allergy  . Tramadol Other (See Comments)    Unknown     Patient Measurements: Height: 5\' 10"  (177.8 cm) Weight: 174 lb 11.2 oz (79.243 kg) IBW/kg (Calculated) : 73  Vital Signs: Temp: 97.5 F (36.4 C) (02/07 0322) Temp Source: Oral (02/07 0322) BP: 166/87 mmHg (02/07 0553) Pulse Rate: 64 (02/07 0553)  Labs:  Recent Labs  12/10/15 1808 12/10/15 2326 12/11/15 0529  HGB 13.7  --  14.0  HCT 42.4  --  42.1  PLT 175  --  180  LABPROT  --   --  14.9  INR  --   --  1.15  HEPARINUNFRC  --   --  0.77*  CREATININE 1.51*  --   --   TROPONINI <0.03 <0.03  --     Estimated Creatinine Clearance: 44.3 mL/min (by C-G formula based on Cr of 1.51).  Assessment: 75 y.o. male with chest pain for heparin  Goal of Therapy:  Heparin level 0.3-0.7 units/ml Monitor platelets by anticoagulation protocol: Yes   Plan:  Decrease Heparin 850 units/hr F/U after cath  Caryl Pina 12/11/2015,6:43 AM

## 2015-12-11 NOTE — Care Management Obs Status (Signed)
[place] NOTIFICATION   Patient Details  Name: ELRIC GLAVIN MRN: [id number] Date of Birth: 09/29/1941   Medicare Observation Status Notification Given:  Yes    Dawayne Patricia, RN 12/11/2015, 4:21 PM

## 2015-12-12 ENCOUNTER — Encounter (HOSPITAL_COMMUNITY): Payer: Self-pay | Admitting: Cardiology

## 2015-12-12 DIAGNOSIS — I1310 Hypertensive heart and chronic kidney disease without heart failure, with stage 1 through stage 4 chronic kidney disease, or unspecified chronic kidney disease: Secondary | ICD-10-CM | POA: Diagnosis not present

## 2015-12-12 DIAGNOSIS — I739 Peripheral vascular disease, unspecified: Secondary | ICD-10-CM | POA: Diagnosis not present

## 2015-12-12 DIAGNOSIS — I255 Ischemic cardiomyopathy: Secondary | ICD-10-CM | POA: Diagnosis not present

## 2015-12-12 DIAGNOSIS — R9430 Abnormal result of cardiovascular function study, unspecified: Secondary | ICD-10-CM | POA: Diagnosis not present

## 2015-12-12 DIAGNOSIS — I2582 Chronic total occlusion of coronary artery: Secondary | ICD-10-CM | POA: Diagnosis not present

## 2015-12-12 DIAGNOSIS — I25118 Atherosclerotic heart disease of native coronary artery with other forms of angina pectoris: Secondary | ICD-10-CM | POA: Diagnosis not present

## 2015-12-12 DIAGNOSIS — Z87891 Personal history of nicotine dependence: Secondary | ICD-10-CM | POA: Diagnosis not present

## 2015-12-12 DIAGNOSIS — I130 Hypertensive heart and chronic kidney disease with heart failure and stage 1 through stage 4 chronic kidney disease, or unspecified chronic kidney disease: Secondary | ICD-10-CM | POA: Diagnosis not present

## 2015-12-12 DIAGNOSIS — N183 Chronic kidney disease, stage 3 (moderate): Secondary | ICD-10-CM | POA: Diagnosis not present

## 2015-12-12 DIAGNOSIS — I2510 Atherosclerotic heart disease of native coronary artery without angina pectoris: Secondary | ICD-10-CM | POA: Diagnosis not present

## 2015-12-12 DIAGNOSIS — I252 Old myocardial infarction: Secondary | ICD-10-CM | POA: Diagnosis not present

## 2015-12-12 DIAGNOSIS — I4891 Unspecified atrial fibrillation: Secondary | ICD-10-CM | POA: Diagnosis not present

## 2015-12-12 DIAGNOSIS — I200 Unstable angina: Secondary | ICD-10-CM | POA: Diagnosis not present

## 2015-12-12 LAB — BASIC METABOLIC PANEL
ANION GAP: 10 (ref 5–15)
BUN: 20 mg/dL (ref 6–20)
CALCIUM: 9.4 mg/dL (ref 8.9–10.3)
CO2: 23 mmol/L (ref 22–32)
Chloride: 108 mmol/L (ref 101–111)
Creatinine, Ser: 1.45 mg/dL — ABNORMAL HIGH (ref 0.61–1.24)
GFR calc Af Amer: 53 mL/min — ABNORMAL LOW (ref >60)
GFR calc non Af Amer: 46 mL/min — ABNORMAL LOW (ref >60)
GLUCOSE: 107 mg/dL — AB (ref 65–99)
Potassium: 4.1 mmol/L (ref 3.5–5.1)
Sodium: 141 mmol/L (ref 135–145)

## 2015-12-12 LAB — CBC
HEMATOCRIT: 38.8 % — AB (ref 39.0–52.0)
HEMOGLOBIN: 13 g/dL (ref 13.0–17.0)
MCH: 32.4 pg (ref 26.0–34.0)
MCHC: 33.5 g/dL (ref 30.0–36.0)
MCV: 96.8 fL (ref 78.0–100.0)
Platelets: 173 10*3/uL (ref 150–400)
RBC: 4.01 MIL/uL — ABNORMAL LOW (ref 4.22–5.81)
RDW: 12.7 % (ref 11.5–15.5)
WBC: 7.2 10*3/uL (ref 4.0–10.5)

## 2015-12-12 MED ORDER — ISOSORBIDE MONONITRATE ER 120 MG PO TB24: 120.0000 mg | ORAL_TABLET | Freq: Every day | ORAL | Status: DC

## 2015-12-12 MED FILL — Lidocaine HCl Local Preservative Free (PF) Inj 1%: INTRAMUSCULAR | Qty: 5 | Status: AC

## 2015-12-12 NOTE — Discharge Summary (Signed)
NAMEMarland Kitchen  JADUS, YACK NO.:  [id number]  MEDICAL RECORD NO.:  [id number]  LOCATION:  [id number]                        FACILITY:  [facility]  PHYSICIAN:  Allegra Lai. Terrence Dupont, M.D. DATE OF BIRTH:  07/04/1941  DATE OF ADMISSION:  12/10/2015 DATE OF DISCHARGE:  12/12/2015                              DISCHARGE SUMMARY   ADMITTING DIAGNOSES: 1. Accelerated angina positive nuclear stress test, rule out     progression of coronary artery disease. 2. Compensated systolic congestive heart failure. 3. Ischemic cardiomyopathy status post ICD in the past. 4. Multivessel coronary artery disease, history of inferior wall     myocardial infarction in the past status post coronary artery     bypass graft. 5. Hypertension. 6. Hyperlipidemia. 7. Chronic kidney disease, stage 3. 8. History of tobacco abuse.  FINAL DIAGNOSES: 1. Stable angina, status post left cardiac catheterization. 2. Multivessel coronary artery disease, history of inferior wall     myocardial infarction in the past status post coronary artery     bypass graft. 3. Compensated systolic congestive heart failure. 4. Ischemic cardiomyopathy status post ICD. 5. Hypertension. 6. Chronic kidney disease, stage 3, stable. 7. Hyperlipidemia. 8. History of tobacco abuse. 9. Depression.  DISCHARGE HOME MEDICATIONS: 1. Aspirin 81 mg 1 tablet daily. 2. Wellbutrin XL 300 mg daily. 3. Carvedilol 25 mg 1 tablet twice daily. 4. Clopidogrel 75 mg 1 tablet daily. 5. Flexeril 5 mg 3 times daily as needed. 6. Lexapro 10 mg daily. 7. Pepcid 20 mg daily at bedtime. 8. Gabapentin 300 mg twice daily. 9. Pantoprazole 40 mg daily. 10.Crestor 20 mg daily. 11.Entresto 97/103 mg twice daily. 12.Trazodone 50 mg daily at night. 13.Isosorbide mononitrate 120 mg daily in the morning.  DIET:  Low-salt, low-cholesterol.  The patient has been advised to drink plenty of fluids for next 48 hours, avoid NSAIDs.  Post cardiac cath instructions  have been given.  CONDITION AT DISCHARGE:  Stable.  BRIEF HISTORY AND HOSPITAL COURSE:  Connor Wilkerson is 75 year old male with past medical history significant for coronary artery disease, history of inferior wall MI in 1999, subsequently noted to have multivessel CAD, requiring CABG, hypertension, prediabetic, history of congestive heart failure secondary to depressed LV systolic function, ischemic cardiomyopathy status post ICD in the past, hyperlipidemia, chronic kidney disease, stage 3, history of tobacco abuse in the past.  Strong family history of coronary artery disease, complains of recurrent retrosternal chest pain described as fullness, tightness off and on without associated symptoms.  The patient denies any nausea, vomiting, diaphoresis.  Denies PND, orthopnea, or leg swelling.  Patient underwent Lexiscan Myoview in 2016, which showed mild mid and basal inferolateral wall ischemia with EF of 34%, patient opted initially for medical management in view of renal insufficiency, but due to recurrent chest pain, agreed for PCI.  PHYSICAL EXAMINATION:  GENERAL:  He was alert, awake, oriented x3 in no acute distress. VITAL SIGNS:  Blood pressure was 150/86, pulse 60 regular. HEENT:  Conjunctiva was pink. NECK:  Supple.  No JVD.  No bruit. LUNGS:  Clear to auscultation without rhonchi or rales. CARDIOVASCULAR:  S1, S2 was normal.  There was soft systolic murmur.  No S3, gallop. ABDOMEN:  Soft.  Bowel sounds were present, nontender. EXTREMITIES:  There is no clubbing, cyanosis, or edema.  LABORATORY DATA:  Sodium was 137, potassium 4.5, BUN 24, creatinine 1.51.  Glucose was 104.  Three sets of troponin-I were negative. Cholesterol was 156, triglycerides 112, HDL 47, LDL 87.  Hemoglobin was 13.7, hematocrit 42.4, white count of 6.6.  Post procedure, BUN 20, creatinine 1.45, which has been stable.  BRIEF HOSPITAL COURSE:  The patient was admitted to telemetry unit, was hydrated  overnight, and subsequently underwent left cardiac cath with selective left and right coronary angiography and visualization of saphenous vein graft, and LIMA graft as per procedure report.  The patient tolerated the procedure well.  The patient had distal anastomotic stenosis into the saphenous vein graft to the RCA, had the trifurcation which was similar as compared to prior caths which was felt not the good lesion in view of location of the stenosis and opted for medical management.  The patient did not have any further episodes of chest pain during the hospital stay.  His groin is stable with no evidence of hematoma or bruit.  Patient is ambulating in the room without any problems and will be discharged home on above medications and will be followed up in my office in 1 week.     Allegra Lai. Terrence Dupont, M.D.     MNH/MEDQ  D:  12/12/2015  T:  12/12/2015  Job:  [id number]

## 2015-12-12 NOTE — Progress Notes (Signed)
Subjective:  Doing well.  Denies any chest pain or shortness of breath.  Denies palpitations, lightheadedness or syncope.  Underwent cardiac catheterization yesterday tolerated the procedure well.  Objective:  Vital Signs in the last 24 hours: Temp:  [97.6 F (36.4 C)-98.2 F (36.8 C)] 97.8 F (36.6 C) (02/08 0936) Pulse Rate:  [0-98] 65 (02/08 0936) Resp:  [0-39] 18 (02/08 0936) BP: (129-224)/(77-132) 154/110 mmHg (02/08 0936) SpO2:  [0 %-100 %] 97 % (02/08 0936)  Intake/Output from previous day: 02/07 0701 - 02/08 0700 In: 458.9 [P.O.:360; I.V.:98.9] Out: 800 [Urine:800] Intake/Output from this shift: Total I/O In: 240 [P.O.:240] Out: -   Physical Exam: Neck: no adenopathy, no carotid bruit, no JVD and supple, symmetrical, trachea midline Lungs: clear to auscultation bilaterally Heart: regular rate and rhythm, S1, S2 normal and soft systolic murmur noted.  No S3 gallop Abdomen: soft, non-tender; bowel sounds normal; no masses,  no organomegaly Extremities: extremities normal, atraumatic, no cyanosis or edema Right groin stable Lab Results:  Recent Labs  12/11/15 0529 12/12/15 0510  WBC 8.4 7.2  HGB 14.0 13.0  PLT 180 173    Recent Labs  12/11/15 0529 12/12/15 0510  NA 142 141  K 3.6 4.1  CL 105 108  CO2 25 23  GLUCOSE 118* 107*  BUN 23* 20  CREATININE 1.45* 1.45*    Recent Labs  12/10/15 2326 12/11/15 0529  TROPONINI <0.03 <0.03   Hepatic Function Panel No results for input(s): PROT, ALBUMIN, AST, ALT, ALKPHOS, BILITOT, BILIDIR, IBILI in the last 72 hours.  Recent Labs  12/11/15 0529  CHOL 156   No results for input(s): PROTIME in the last 72 hours.  Imaging: Imaging results have been reviewed and No results found.  Cardiac Studies:  Assessment/Plan:  Stable angina.  MI ruled out.  Status post left cardiac catheterization. Multivessel CAD, history of inferior wall MI in the past, status post CABG Ischemic cardiomyopathy status post  ICD. Compensated systolic congestive heart failure. Hypertension Hyperlipidemia Chronic kidney disease stage III. History of tobacco abuse. Plan DC home. Postcardiac catheterization instructions have been given. Medical management     Charolette Forward 12/12/2015, 11:51 AM

## 2015-12-12 NOTE — Discharge Summary (Signed)
Discharge summary dictated on 12/12/2015, dictation number is [id number]

## 2015-12-12 NOTE — Discharge Instructions (Signed)
Angina Pectoris  Angina pectoris, often called angina, is extreme discomfort in the chest, neck, or arm. This is caused by a lack of blood in the middle and thickest layer of the heart wall (myocardium). There are four types of angina:  · Stable angina. Stable angina usually occurs in episodes of predictable frequency and duration. It is usually brought on by physical activity, stress, or excitement. Stable angina usually lasts a few minutes and can often be relieved by a medicine that you place under your tongue. This medicine is called sublingual nitroglycerin.  · Unstable angina. Unstable angina can occur even when you are doing little or no physical activity. It can even occur while you are sleeping or when you are at rest. It can suddenly increase in severity or frequency. It may not be relieved by sublingual nitroglycerin, and it can last up to 30 minutes.  · Microvascular angina. This type of angina is caused by a disorder of tiny blood vessels called arterioles. Microvascular angina is more common in women. The pain may be more severe and last longer than other types of angina pectoris.  · Prinzmetal or variant angina. This type of angina pectoris is rare and usually occurs when you are doing little or no physical activity. It especially occurs in the early morning hours.  CAUSES  Atherosclerosis is the cause of angina. This is the buildup of fat and cholesterol (plaque) on the inside of the arteries. Over time, the plaque may narrow or block the artery, and this will lessen blood flow to the heart. Plaque can also become weak and break off within a coronary artery to form a clot and cause a sudden blockage.  RISK FACTORS  Risk factors common to both men and women include:  · High cholesterol levels.  · High blood pressure (hypertension).  · Tobacco use.  · Diabetes.  · Family history of angina.  · Obesity.  · Lack of exercise.  · A diet high in saturated fats.  Women are at greater risk for angina if they  are:  · Over age 55.  · Postmenopausal.  SYMPTOMS  Many people do not experience any symptoms during the early stages of angina. As the condition progresses, symptoms common to both men and women may include:  · Chest pain.    The pain can be described as a crushing or squeezing in the chest, or a tightness, pressure, fullness, or heaviness in the chest.    The pain can last more than a few minutes, or it can stop and recur.  · Pain in the arms, neck, jaw, or back.  · Unexplained heartburn or indigestion.  · Shortness of breath.  · Nausea.  · Sudden cold sweats.  · Sudden light-headedness.  Many women have chest discomfort and some of the other symptoms. However, women often have different (atypical) symptoms, such as:   · Fatigue.  · Unexplained feelings of nervousness or anxiety.  · Unexplained weakness.  · Dizziness or fainting.  Sometimes, women may have angina without any symptoms.  DIAGNOSIS   Tests to diagnose angina may include:  · ECG (electrocardiogram).  · Exercise stress test. This looks for signs of blockage when the heart is being exercised.  · Pharmacologic stress test. This test looks for signs of blockage when the heart is being stressed with a medicine.  · Blood tests.  · Coronary angiogram. This is a procedure to look at the coronary arteries to see if there is any blockage.    surgery. This will allow your blood to pass the blockage (bypass) to reach your heart. HOME CARE INSTRUCTIONS   Take medicines only as directed by your health care provider.  Do not take the following medicines unless your health care provider approves:  Nonsteroidal anti-inflammatory drugs (NSAIDs), such as ibuprofen,  naproxen, or celecoxib.  Vitamin supplements that contain vitamin A, vitamin E, or both.  Hormone replacement therapy that contains estrogen with or without progestin.  Manage other health conditions such as hypertension and diabetes as directed by your health care provider.  Follow a heart-healthy diet. A dietitian can help to educate you about healthy food options and changes.  Use healthy cooking methods such as roasting, grilling, broiling, baking, poaching, steaming, or stir-frying. Talk to a dietitian to learn more about healthy cooking methods.  Follow an exercise program approved by your health care provider.  Maintain a healthy weight. Lose weight as approved by your health care provider.  Plan rest periods when fatigued.  Learn to manage stress.  Do not use any tobacco products, including cigarettes, chewing tobacco, or electronic cigarettes. If you need help quitting, ask your health care provider.  If you drink alcohol, and your health care provider approves, limit your alcohol intake to no more than 1 drink per day. One drink equals 12 ounces of beer, 5 ounces of wine, or 1 ounces of hard liquor.  Stop illegal drug use.  Keep all follow-up visits as directed by your health care provider. This is important. SEEK IMMEDIATE MEDICAL CARE IF:   You have pain in your chest, neck, arm, jaw, stomach, or back that lasts more than a few minutes, is recurring, or is unrelieved by taking sublingualnitroglycerin.  You have profuse sweating without cause.  You have unexplained:  Heartburn or indigestion.  Shortness of breath or difficulty breathing.  Nausea or vomiting.  Fatigue.  Feelings of nervousness or anxiety.  Weakness.  Diarrhea.  You have sudden light-headedness or dizziness.  You faint. These symptoms may represent a serious problem that is an emergency. Do not wait to see if the symptoms will go away. Get medical help right away. Call your local  emergency services (911 in the [place] Do not drive yourself to the hospital.   This information is not intended to replace advice given to you by your health care provider. Make sure you discuss any questions you have with your health care provider.   Document Released: 10/20/2005 Document Revised: 11/10/2014 Document Reviewed: 02/21/2014 Elsevier Interactive Patient Education 2016 Else[employer] Coronary Angiogram A coronary angiogram, also called coronary angiography, is an X-ray procedure used to look at the arteries in the heart. In this procedure, a dye (contrast dye) is injected through a long, hollow tube (catheter). The catheter is about the size of a piece of cooked spaghetti and is inserted through your groin, wrist, or arm. The dye is injected into each artery, and X-rays are then taken to show if there is a blockage in the arteries of your heart. LET [facility] CARE PROVIDER KNOW ABOUT:  Any allergies you have, including allergies to shellfish or contrast dye.   All medicines you are taking, including vitamins, herbs, eye drops, creams, and over-the-counter medicines.   Previous problems you or members of your family have had with the use of anesthetics.   Any blood disorders you have.   Previous surgeries you have had.  History of kidney problems or failure.   Other medical conditions you have. RISKS AND COMPLICATIONS  Generally, a coronary angiogram is a safe procedure. However, problems can occur and include:  Allergic reaction to the dye.  Bleeding from the access site or other locations.  Kidney injury, especially in people with impaired kidney function.  Stroke (rare).  Heart attack (rare). BEFORE THE PROCEDURE   Do not eat or drink anything after midnight the night before the procedure or as directed by your health care provider.   Ask your health care provider about changing or stopping your regular medicines. This is especially important if you are  taking diabetes medicines or blood thinners. PROCEDURE  You may be given a medicine to help you relax (sedative) before the procedure. This medicine is given through an intravenous (IV) access tube that is inserted into one of your veins.   The area where the catheter will be inserted will be washed and shaved. This is usually done in the groin but may be done in the fold of your arm (near your elbow) or in the wrist.   A medicine will be given to numb the area where the catheter will be inserted (local anesthetic).   The health care provider will insert the catheter into an artery. The catheter will be guided by using a special type of X-ray (fluoroscopy) of the blood vessel being examined.   A special dye will then be injected into the catheter, and X-rays will be taken. The dye will help to show where any narrowing or blockages are located in the heart arteries.  AFTER THE PROCEDURE   If the procedure is done through the leg, you will be kept in bed lying flat for several hours. You will be instructed to not bend or cross your legs.  The insertion site will be checked frequently.   The pulse in your feet or wrist will be checked frequently.   Additional blood tests, X-rays, and an electrocardiogram may be done.    This information is not intended to replace advice given to you by your health care provider. Make sure you discuss any questions you have with your health care provider.   Document Released: 04/26/2003 Document Revised: 11/10/2014 Document Reviewed: 03/14/2013 Elsevier Interactive Patient Education [employer].

## 2015-12-12 NOTE — Progress Notes (Addendum)
Pt in stable conditions, went over discharge instructions with pt, pt verbalises understanding, pt belongings at bedside, IV taken out.Pt wheeled of the floor on a wheelcahir  Oren Beckmann, RN

## 2015-12-19 DIAGNOSIS — I1310 Hypertensive heart and chronic kidney disease without heart failure, with stage 1 through stage 4 chronic kidney disease, or unspecified chronic kidney disease: Secondary | ICD-10-CM | POA: Diagnosis not present

## 2015-12-19 DIAGNOSIS — I25118 Atherosclerotic heart disease of native coronary artery with other forms of angina pectoris: Secondary | ICD-10-CM | POA: Diagnosis not present

## 2015-12-19 DIAGNOSIS — N189 Chronic kidney disease, unspecified: Secondary | ICD-10-CM | POA: Diagnosis not present

## 2015-12-19 DIAGNOSIS — I255 Ischemic cardiomyopathy: Secondary | ICD-10-CM | POA: Diagnosis not present

## 2015-12-19 DIAGNOSIS — E119 Type 2 diabetes mellitus without complications: Secondary | ICD-10-CM | POA: Diagnosis not present

## 2015-12-19 DIAGNOSIS — E785 Hyperlipidemia, unspecified: Secondary | ICD-10-CM | POA: Diagnosis not present

## 2015-12-19 DIAGNOSIS — F17290 Nicotine dependence, other tobacco product, uncomplicated: Secondary | ICD-10-CM | POA: Diagnosis not present

## 2015-12-19 DIAGNOSIS — I2510 Atherosclerotic heart disease of native coronary artery without angina pectoris: Secondary | ICD-10-CM | POA: Diagnosis not present

## 2016-01-02 ENCOUNTER — Encounter: Payer: Self-pay | Admitting: *Deleted

## 2016-01-16 DIAGNOSIS — I255 Ischemic cardiomyopathy: Secondary | ICD-10-CM | POA: Diagnosis not present

## 2016-01-16 DIAGNOSIS — I25118 Atherosclerotic heart disease of native coronary artery with other forms of angina pectoris: Secondary | ICD-10-CM | POA: Diagnosis not present

## 2016-01-16 DIAGNOSIS — I252 Old myocardial infarction: Secondary | ICD-10-CM | POA: Diagnosis not present

## 2016-01-16 DIAGNOSIS — F17290 Nicotine dependence, other tobacco product, uncomplicated: Secondary | ICD-10-CM | POA: Diagnosis not present

## 2016-01-16 DIAGNOSIS — I1310 Hypertensive heart and chronic kidney disease without heart failure, with stage 1 through stage 4 chronic kidney disease, or unspecified chronic kidney disease: Secondary | ICD-10-CM | POA: Diagnosis not present

## 2016-01-16 DIAGNOSIS — N189 Chronic kidney disease, unspecified: Secondary | ICD-10-CM | POA: Diagnosis not present

## 2016-01-16 DIAGNOSIS — E785 Hyperlipidemia, unspecified: Secondary | ICD-10-CM | POA: Diagnosis not present

## 2016-02-25 ENCOUNTER — Other Ambulatory Visit: Payer: Self-pay | Admitting: Internal Medicine

## 2016-03-12 ENCOUNTER — Encounter: Payer: Self-pay | Admitting: Internal Medicine

## 2016-03-12 ENCOUNTER — Ambulatory Visit (INDEPENDENT_AMBULATORY_CARE_PROVIDER_SITE_OTHER): Payer: Medicare Other | Admitting: Internal Medicine

## 2016-03-12 VITALS — BP 148/84 | HR 67 | Ht 70.0 in | Wt 178.6 lb

## 2016-03-12 DIAGNOSIS — I5022 Chronic systolic (congestive) heart failure: Secondary | ICD-10-CM | POA: Diagnosis not present

## 2016-03-12 DIAGNOSIS — I255 Ischemic cardiomyopathy: Secondary | ICD-10-CM

## 2016-03-12 DIAGNOSIS — I480 Paroxysmal atrial fibrillation: Secondary | ICD-10-CM

## 2016-03-12 DIAGNOSIS — Z4502 Encounter for adjustment and management of automatic implantable cardiac defibrillator: Secondary | ICD-10-CM | POA: Diagnosis not present

## 2016-03-12 NOTE — Progress Notes (Signed)
Patient Care Team: Ernestene Kiel, MD as PCP - General (Internal Medicine) Unice Bailey, MD as Consulting Physician (Rheumatology) Deboraha Sprang, MD as Consulting Physician (Cardiology) Serafina Mitchell, MD as Consulting Physician (Vascular Surgery)   HPI  Connor Wilkerson is a 75 y.o. male Seen following ICD generator replacement for ischemic cardiomyopathy subsequetly  the device pocket moved to subpectorally.     His blood pressure is quite elevated. He brings in measurements from home demonstrating blood pressures ranging from 130--170. He underwent catheterization 2/17 following an abnormal Myoview scan raising the possibility of ischemia this having been done in the context of chest pain. Catheterization was done by Dr. [facility]; it demonstrated no interval change with patent grafts. Ejection fraction by Myoview scan 11/16 was 34%  The patient denies chest pain, shortness of breath, nocturnal dyspnea, orthopnea or peripheral edema.  There have been no palpitations, lightheadedness or syncope.          Past Medical History     Past Medical History  Diagnosis Date  . Dyspnea   . Ischemic cardiomyopathy   . ICD (implantable cardiac defibrillator)  BSx   . Atrial fibrillation ([place])   . Myocardial infarction ([place])   . Peripheral vascular disease ([place])   . Near syncope 09/21/2014  . Abnormality of gait 02/07/2015    Past Surgical History  Procedure Laterality Date  . Cardiac defibrillator placement    . Coronary artery bypass graft    . Hernia repair      x2  . Rotator cuff repair Right 2006  . Elbow surgery Left     plastic bone replacement  . Sinus exploration  1987  . Permanent pacemaker generator change N/A 11/25/2013    Procedure: PERMANENT PACEMAKER GENERATOR CHANGE;  Surgeon: Deboraha Sprang, MD;  Location: [facility] CATH LAB;  Service: Cardiovascular;  Laterality: N/A;  . Cardiac catheterization N/A 12/11/2015    Procedure: Left Heart Cath and Coronary  Angiography;  Surgeon: Charolette Forward, MD;  Location: [facility] CV LAB;  Service: Cardiovascular;  Laterality: N/A;    Current Outpatient Prescriptions  Medication Sig Dispense Refill  . aspirin 81 MG tablet Take 81 mg by mouth 2 (two) times daily.     Marland Kitchen buPROPion (WELLBUTRIN XL) 300 MG 24 hr tablet Take 300 mg by mouth daily.      . carvedilol (COREG) 25 MG tablet TAKE 1 TABLET BY MOUTH TWICE A DAY WITH A MEAL 180 tablet 0  . clopidogrel (PLAVIX) 75 MG tablet Take 75 mg by mouth daily.  3  . cyclobenzaprine (FLEXERIL) 5 MG tablet Take 5 mg by mouth 3 (three) times daily as needed for muscle spasms.   2  . escitalopram (LEXAPRO) 10 MG tablet Take 10 mg by mouth at bedtime.     . famotidine (PEPCID) 20 MG tablet Take 20 mg by mouth at bedtime.     . gabapentin (NEURONTIN) 300 MG capsule Take 300 mg by mouth 2 (two) times daily as needed (leg/shoulder pain).     . isosorbide mononitrate (IMDUR) 120 MG 24 hr tablet Take 1 tablet (120 mg total) by mouth daily. At bedtime 30 tablet 3  . isosorbide mononitrate (IMDUR) 60 MG 24 hr tablet Take 60 mg by mouth daily.    . nitroGLYCERIN (NITROSTAT) 0.4 MG SL tablet Place 0.4 mg under the tongue every 5 (five) minutes as needed for chest pain.    . pantoprazole (PROTONIX) 40 MG tablet Take 40 mg  by mouth 2 (two) times daily.     . rosuvastatin (CRESTOR) 20 MG tablet Take 20 mg by mouth at bedtime.     . sacubitril-valsartan (ENTRESTO) 97-103 MG Take 1 tablet by mouth 2 (two) times daily.    . traZODone (DESYREL) 50 MG tablet Take 50 mg by mouth at bedtime as needed for sleep.     No current facility-administered medications for this visit.    Allergies  Allergen Reactions  . Amlodipine     Pt not sure if this is an allergy  . Tramadol Other (See Comments)    Unknown     Review of Systems negative except from HPI and PMH  Physical Exam BP 148/84 mmHg  Pulse 67  Ht 5\' 10"  (1.778 m)  Wt 178 lb 9.6 oz (81.012 kg)  BMI 25.63 kg/m2  SpO2  92% Well developed and nourished in no acute distress HENT normal Neck supple with JVP-flat Clear Device pocket well healed; without hematoma or erythema.  There is no tethering  Regular rate and rhythm, 2/6 murmur  Abd-soft with active BS No Clubbing cyanosis edema Skin-warm and dry A & Oriented  Grossly normal sensory and motor function    Assessment and  Plan\  Ischemic cardiomyopathy  Implantable defibrillator The patient's device was interrogated.  The information was reviewed. No changes were made in the programming.     Hypertension   Congestive heart failure-chronic-systolic  Euvolemic continue current meds  Without symptoms of ischemia  BP well controlled

## 2016-03-12 NOTE — Patient Instructions (Signed)

## 2016-03-17 LAB — CUP PACEART INCLINIC DEVICE CHECK
HIGH POWER IMPEDANCE MEASURED VALUE: 44 Ohm
Implantable Lead Implant Date: 20060512
Implantable Lead Location: 753859
Implantable Lead Location: 753860
Implantable Lead Model: 5076
Lead Channel Impedance Value: 396 Ohm
Lead Channel Impedance Value: 624 Ohm
Lead Channel Pacing Threshold Amplitude: 0.6 V
Lead Channel Pacing Threshold Pulse Width: 0.4 ms
Lead Channel Pacing Threshold Pulse Width: 0.4 ms
Lead Channel Sensing Intrinsic Amplitude: 2.1 mV
Lead Channel Setting Pacing Amplitude: 2 V
Lead Channel Setting Pacing Amplitude: 2.4 V
MDC IDC LEAD IMPLANT DT: 20060512
MDC IDC LEAD MODEL: 158
MDC IDC LEAD SERIAL: 159477
MDC IDC MSMT LEADCHNL RV PACING THRESHOLD AMPLITUDE: 0.7 V
MDC IDC MSMT LEADCHNL RV SENSING INTR AMPL: 11 mV
MDC IDC SESS DTM: 20170510040000
MDC IDC SET LEADCHNL RV PACING PULSEWIDTH: 0.4 ms
MDC IDC SET LEADCHNL RV SENSING SENSITIVITY: 0.6 mV
Pulse Gen Serial Number: 112776

## 2016-03-18 ENCOUNTER — Encounter: Payer: Self-pay | Admitting: Family

## 2016-03-18 DIAGNOSIS — I2510 Atherosclerotic heart disease of native coronary artery without angina pectoris: Secondary | ICD-10-CM | POA: Diagnosis not present

## 2016-03-18 DIAGNOSIS — G609 Hereditary and idiopathic neuropathy, unspecified: Secondary | ICD-10-CM | POA: Diagnosis not present

## 2016-03-18 DIAGNOSIS — I10 Essential (primary) hypertension: Secondary | ICD-10-CM | POA: Diagnosis not present

## 2016-03-18 DIAGNOSIS — M1990 Unspecified osteoarthritis, unspecified site: Secondary | ICD-10-CM | POA: Diagnosis not present

## 2016-03-18 DIAGNOSIS — I7300 Raynaud's syndrome without gangrene: Secondary | ICD-10-CM | POA: Diagnosis not present

## 2016-03-24 ENCOUNTER — Ambulatory Visit (HOSPITAL_COMMUNITY)
Admission: RE | Admit: 2016-03-24 | Discharge: 2016-03-24 | Disposition: A | Discharge status: Home / Self Care | Payer: Medicare Other | Source: Ambulatory Visit | Attending: Family | Admitting: Family

## 2016-03-24 ENCOUNTER — Ambulatory Visit (INDEPENDENT_AMBULATORY_CARE_PROVIDER_SITE_OTHER): Payer: Medicare Other | Admitting: Family

## 2016-03-24 ENCOUNTER — Other Ambulatory Visit: Payer: Self-pay | Admitting: Family

## 2016-03-24 ENCOUNTER — Encounter: Payer: Self-pay | Admitting: Family

## 2016-03-24 VITALS — BP 110/74 | HR 60 | Temp 98.2°F | Resp 12 | Ht 70.0 in | Wt 174.3 lb

## 2016-03-24 DIAGNOSIS — I6523 Occlusion and stenosis of bilateral carotid arteries: Secondary | ICD-10-CM

## 2016-03-24 DIAGNOSIS — Z87891 Personal history of nicotine dependence: Secondary | ICD-10-CM | POA: Diagnosis not present

## 2016-03-24 DIAGNOSIS — I255 Ischemic cardiomyopathy: Secondary | ICD-10-CM

## 2016-03-24 NOTE — Patient Instructions (Signed)
Stroke Prevention Some medical conditions and behaviors are associated with an increased chance of having a stroke. You may prevent a stroke by making healthy choices and managing medical conditions. HOW CAN I REDUCE MY RISK OF HAVING A STROKE?   Stay physically active. Get at least 30 minutes of activity on most or all days.  Do not smoke. It may also be helpful to avoid exposure to secondhand smoke.  Limit alcohol use. Moderate alcohol use is considered to be:  No more than 2 drinks per day for men.  No more than 1 drink per day for nonpregnant women.  Eat healthy foods. This involves:  Eating 5 or more servings of fruits and vegetables a day.  Making dietary changes that address high blood pressure (hypertension), high cholesterol, diabetes, or obesity.  Manage your cholesterol levels.  Making food choices that are high in fiber and low in saturated fat, trans fat, and cholesterol may control cholesterol levels.  Take any prescribed medicines to control cholesterol as directed by your health care provider.  Manage your diabetes.  Controlling your carbohydrate and sugar intake is recommended to manage diabetes.  Take any prescribed medicines to control diabetes as directed by your health care provider.  Control your hypertension.  Making food choices that are low in salt (sodium), saturated fat, trans fat, and cholesterol is recommended to manage hypertension.  Ask your health care provider if you need treatment to lower your blood pressure. Take any prescribed medicines to control hypertension as directed by your health care provider.  If you are 18-39 years of age, have your blood pressure checked every 3-5 years. If you are 40 years of age or older, have your blood pressure checked every year.  Maintain a healthy weight.  Reducing calorie intake and making food choices that are low in sodium, saturated fat, trans fat, and cholesterol are recommended to manage  weight.  Stop drug abuse.  Avoid taking birth control pills.  Talk to your health care provider about the risks of taking birth control pills if you are over 35 years old, smoke, get migraines, or have ever had a blood clot.  Get evaluated for sleep disorders (sleep apnea).  Talk to your health care provider about getting a sleep evaluation if you snore a lot or have excessive sleepiness.  Take medicines only as directed by your health care provider.  For some people, aspirin or blood thinners (anticoagulants) are helpful in reducing the risk of forming abnormal blood clots that can lead to stroke. If you have the irregular heart rhythm of atrial fibrillation, you should be on a blood thinner unless there is a good reason you cannot take them.  Understand all your medicine instructions.  Make sure that other conditions (such as anemia or atherosclerosis) are addressed. SEEK IMMEDIATE MEDICAL CARE IF:   You have sudden weakness or numbness of the face, arm, or leg, especially on one side of the body.  Your face or eyelid droops to one side.  You have sudden confusion.  You have trouble speaking (aphasia) or understanding.  You have sudden trouble seeing in one or both eyes.  You have sudden trouble walking.  You have dizziness.  You have a loss of balance or coordination.  You have a sudden, severe headache with no known cause.  You have new chest pain or an irregular heartbeat. Any of these symptoms may represent a serious problem that is an emergency. Do not wait to see if the symptoms will   go away. Get medical help at once. Call your local emergency services (911 in [place] Do not drive yourself to the hospital.   This information is not intended to replace advice given to you by your health care provider. Make sure you discuss any questions you have with your health care provider.   Document Released: 11/27/2004 Document Revised: 11/10/2014 Document Reviewed:  04/22/2013 Elsevier Interactive Patient Education 2016 Elsevier Inc.  

## 2016-03-24 NOTE — Progress Notes (Signed)
Chief Complaint: Follow up Extracranial Carotid Artery Stenosis   History of Present Illness  Connor Wilkerson is a 75 y.o. male patient of Dr. Trula Slade who initially presented in November 2015 for near syncope and carotid stenosis. At that time he was having near syncope spells and falling on multiple occasions. He states that since he has seen Dr. Kathlen Mody had his medication for his blood pressure adjusted and he has not had any of the above issues, except some orthostatic hypotensive symtoms on standing.  He did see neurology and an EEG was obtained, which was normal. He has since seen neurology in April 2016. Pt was also evaluated by cardiology. He has a history of a pacemaker.  He states that he still has numbness in his left hand and this is unchanged.   He is on a statin for his cholesterol. He is on a beta blocker for his blood pressure. He takes an aspirin daily.  He reports 5 MI's in less than a week in about 1989, then had a 3 vessel CABG. He reports that his hands have been cold since he had his 5 MI's in 1989.   He denies claudication symptoms with walking, denies non healing wounds.  Pt was last seen by Dr. Trula Slade on 03/19/15. At that time, with blood pressure management, his symptoms had improved. He did have a CT angiogram which showed approximate 50-60% bilateral carotid stenosis. The patient was to continue with surveillance of his carotid arteries on an annual basis. He returns today for follow up.  Pt Diabetic: no Pt smoker: former smoker, quit about 2001, was a pipe smoker, never cigarettes  Pt meds include: Statin : yes ASA: yes Other anticoagulants/antiplatelets: Plavix   Past Medical History  Diagnosis Date  . Dyspnea   . Ischemic cardiomyopathy   . ICD (implantable cardiac defibrillator)  BSx   . Atrial fibrillation ([place])   . Myocardial infarction ([place])   . Peripheral vascular disease ([place])   . Near syncope 09/21/2014  . Abnormality of gait  02/07/2015    Social History Social History  Substance Use Topics  . Smoking status: Former Smoker    Quit date: 09/19/1999  . Smokeless tobacco: Never Used     Comment: quit in 1998  . Alcohol Use: No     Comment: recovering alcoholic, quit [id number] yrs ago    Family History Family History  Problem Relation Age of Onset  . Heart disease Mother   . Heart attack Mother   . Heart disease Father   . Heart attack Father   . Heart disease Brother   . Heart attack Brother     Surgical History Past Surgical History  Procedure Laterality Date  . Cardiac defibrillator placement    . Coronary artery bypass graft    . Hernia repair      x2  . Rotator cuff repair Right 2006  . Elbow surgery Left     plastic bone replacement  . Sinus exploration  1987  . Permanent pacemaker generator change N/A 11/25/2013    Procedure: PERMANENT PACEMAKER GENERATOR CHANGE;  Surgeon: Deboraha Sprang, MD;  [facility] C[facility] LAB;  Service: Cardiovascular;  Laterality: N/A;  . Cardiac catheterization N/A 12/11/2015    Procedure: Left Heart Cath and Coronary Angiography;  Surgeon: Charolette Forward, MD;  Location: [facility] CV LAB;  Service: Cardiovascular;  Laterality: N/A;    Allergies  Allergen Reactions  . Amlodipine     Pt not sure if this  is an allergy  . Tramadol Other (See Comments)    Unknown     Current Outpatient Prescriptions  Medication Sig Dispense Refill  . aspirin 81 MG tablet Take 81 mg by mouth 2 (two) times daily.     . carvedilol (COREG) 25 MG tablet TAKE 1 TABLET BY MOUTH TWICE A DAY WITH A MEAL 180 tablet 0  . clopidogrel (PLAVIX) 75 MG tablet Take 75 mg by mouth daily.  3  . cyclobenzaprine (FLEXERIL) 5 MG tablet Take 5 mg by mouth 3 (three) times daily as needed for muscle spasms.   2  . escitalopram (LEXAPRO) 10 MG tablet Take 10 mg by mouth at bedtime.     . famotidine (PEPCID) 20 MG tablet Take 20 mg by mouth at bedtime.     . gabapentin (NEURONTIN) 300 MG capsule Take 300 mg by  mouth 2 (two) times daily as needed (leg/shoulder pain).     . isosorbide mononitrate (IMDUR) 120 MG 24 hr tablet Take 1 tablet (120 mg total) by mouth daily. At bedtime 30 tablet 3  . isosorbide mononitrate (IMDUR) 60 MG 24 hr tablet Take 60 mg by mouth daily.    . nitroGLYCERIN (NITROSTAT) 0.4 MG SL tablet Place 0.4 mg under the tongue every 5 (five) minutes as needed for chest pain.    Marland Kitchen oxyCODONE-acetaminophen (PERCOCET) 10-325 MG tablet TAKE 1 TABLET BY MOUTH EVERY 6 HOURS AS NEEDED FOR SHOULDER PAIN  0  . pantoprazole (PROTONIX) 40 MG tablet Take 40 mg by mouth 2 (two) times daily.     . rosuvastatin (CRESTOR) 20 MG tablet Take 20 mg by mouth at bedtime.     . sacubitril-valsartan (ENTRESTO) 97-103 MG Take 1 tablet by mouth 2 (two) times daily.    . traZODone (DESYREL) 50 MG tablet Take 50 mg by mouth at bedtime as needed for sleep.    Marland Kitchen buPROPion (WELLBUTRIN XL) 300 MG 24 hr tablet Take 300 mg by mouth daily. Reported on 03/24/2016     No current facility-administered medications for this visit.    Review of Systems : See HPI for pertinent positives and negatives.  Physical Examination  Filed Vitals:   03/24/16 1400  BP: 110/74  Pulse: 60  Temp: 98.2 F (36.8 C)  TempSrc: Oral  Resp: 12  Height: 5\' 10"  (1.778 m)  Weight: 174 lb 4.8 oz (79.062 kg)  SpO2: 98%   Body mass index is 25.01 kg/(m^2).  General: WDWN male in NAD GAIT: normal Eyes: PERRLA Pulmonary:  Non-labored respirations, CTAB with somewhat decreased air movement in left posterior fields, no rales,  rhonchi, or wheezing. Occasional dry cough.   Cardiac: regular rhythm,  no detected murmur.  VASCULAR EXAM Carotid Bruits Right Left   Negative Negative    Aorta is not palpable. Radial pulses are palpable and equal.  LE Pulses Right Left       POPLITEAL  not palpable   not palpable        POSTERIOR TIBIAL   palpable    palpable        DORSALIS PEDIS      ANTERIOR TIBIAL  palpable   palpable     Gastrointestinal: soft, nontender, BS WNL, no r/g, no palpable masses.  Musculoskeletal: no muscle atrophy/wasting. M/S 5/5 throughout, extremities without ischemic changes. Fingers of both hands are cool to touch, sluggish capillary refill.  Neurologic: A&O X 3; Appropriate Affect, Speech is normal CN 2-12 intact, pain and light touch intact in extremities, Motor exam as listed above.   Non-Invasive Vascular Imaging CAROTID DUPLEX 03/24/2016   Right ICA: 60 - 79 % stenosis. Left ICA: 40 - 59 % stenosis. Mild progression of right ICA stenosis compared to previous exam on 09/18/14.   Assessment: Connor Wilkerson is a 75 y.o. male who has no known history of stroke or TIA.  His syncopal episodes seem to improve with decreasing the doses of his medications that have hypotensive effects. He still seems to have orthostatic hypotension on standing. His carotid duplex today suggests 60 - 79 % right ICA stenosis. Left ICA: 40 - 59 % stenosis. Mild progression of right ICA stenosis compared to previous exam on 09/18/14.  Fortunately he stopped smoking a pipe about 2001, he never smoked cigarettes. He does not have DM and is not obese. He mows his lawn and remains active.  He takes a statin, ASA, and Plavix. His atherosclerotic risk factors include prior tobacco use and significant CAD.    Plan: Follow-up in 6 months with Carotid Duplex scan.   I discussed in depth with the patient the nature of atherosclerosis, and emphasized the importance of maximal medical management including strict control of blood pressure, blood glucose, and lipid levels, obtaining regular exercise, and continued cessation of smoking.  The patient is aware that without maximal medical management the underlying atherosclerotic disease process will progress, limiting the benefit of any interventions. The  patient was given information about stroke prevention and what symptoms should prompt the patient to seek immediate medical care. Thank you for allowing [place] to participate in this patient's care.  Clemon Chambers, RN, MSN, FNP-C [facility] Office: [phone]  Clinic Physician: Trula Slade  03/24/2016 2:08 PM

## 2016-03-26 DIAGNOSIS — I7300 Raynaud's syndrome without gangrene: Secondary | ICD-10-CM | POA: Diagnosis not present

## 2016-03-26 DIAGNOSIS — R202 Paresthesia of skin: Secondary | ICD-10-CM | POA: Diagnosis not present

## 2016-03-26 DIAGNOSIS — I2510 Atherosclerotic heart disease of native coronary artery without angina pectoris: Secondary | ICD-10-CM | POA: Diagnosis not present

## 2016-04-01 DIAGNOSIS — E785 Hyperlipidemia, unspecified: Secondary | ICD-10-CM | POA: Diagnosis not present

## 2016-04-01 DIAGNOSIS — N189 Chronic kidney disease, unspecified: Secondary | ICD-10-CM | POA: Diagnosis not present

## 2016-04-01 DIAGNOSIS — I252 Old myocardial infarction: Secondary | ICD-10-CM | POA: Diagnosis not present

## 2016-04-01 DIAGNOSIS — F17290 Nicotine dependence, other tobacco product, uncomplicated: Secondary | ICD-10-CM | POA: Diagnosis not present

## 2016-04-01 DIAGNOSIS — I255 Ischemic cardiomyopathy: Secondary | ICD-10-CM | POA: Diagnosis not present

## 2016-04-01 DIAGNOSIS — I1310 Hypertensive heart and chronic kidney disease without heart failure, with stage 1 through stage 4 chronic kidney disease, or unspecified chronic kidney disease: Secondary | ICD-10-CM | POA: Diagnosis not present

## 2016-04-01 DIAGNOSIS — I25118 Atherosclerotic heart disease of native coronary artery with other forms of angina pectoris: Secondary | ICD-10-CM | POA: Diagnosis not present

## 2016-04-01 DIAGNOSIS — Z8673 Personal history of transient ischemic attack (TIA), and cerebral infarction without residual deficits: Secondary | ICD-10-CM | POA: Diagnosis not present

## 2016-04-02 NOTE — Progress Notes (Signed)
Cardiology Office Note Date:  04/03/2016  Patient ID:  Connor Wilkerson, Connor Wilkerson Apr 27, 1941, MRN [id number] PCP:  Ernestene Kiel, MD  Cardiologist:  Dr. Terrence Dupont Electrophysiologist: Dr. Caryl Comes   Chief Complaint: being shocked by his device, change in voice  History of Present Illness: Connor Wilkerson is a 75 y.o. male with history of CAD/CABG, ICM with ICD, chronic CHF, HTN, HLD, CRI (stage III),  ?PAFib, PVD with vascular non-obstructive carotid disease, orthostatic changes, chronic back pain and arthritic pain.  The patient comes in today being seen for Dr. Caryl Comes last seen by him 03/12/16, at that visit no changes were made and the patient feeling well.  He comes in today at the direction of Dr. Terrence Dupont, after reporting that he had been shocked by his device, and noted change in his speech.    The patient states he was shocked by his device once Friday morning last week and 2x on Sat evening.  He felt fine before and afterwards, no palpitations or symptoms, no syncope.  He in fact denies any kin dof CP, palpitations at all, no SOB, and no syncope.  He has known hx of orthostatic dizziness and syncope, but states after his last medication adjustment he has not fainted.  He is accompanied by his wife who agrees.  He continues to feel dizzy and off balance upon standing, and if he walks right away he feels like he is "walking on a cloud, like my legs feel light/weak", he will sit a few moments and once feeling better will stand again and feel well.    He has noted in the last week or so that his voice is weak, not as strong like his voice his hoarse.  He had once an event of slurred speech that quickly resolved, states sometimes he has a hard time getting the words out, and has also noticed that at times when people are talking to him he doesn't understand everything they are saying.  His wife states this is not necessarily new, but worse in the last couple weeks.  She also mentions that he got lost  on the way to the vascular MD office last week, and over the last few months more and more forgetful needing to repeat things to him a few times.  The patient states he did not mention this to them at the vascular MD office, and as of now no f/u planned with his neurologist.      Past Medical History  Diagnosis Date  . Dyspnea   . Ischemic cardiomyopathy   . ICD (implantable cardiac defibrillator)  BSx   . Atrial fibrillation ([place])   . Myocardial infarction ([place])   . Peripheral vascular disease ([place])   . Near syncope 09/21/2014  . Abnormality of gait 02/07/2015    Past Surgical History  Procedure Laterality Date  . Cardiac defibrillator placement    . Coronary artery bypass graft    . Hernia repair      x2  . Rotator cuff repair Right 2006  . Elbow surgery Left     plastic bone replacement  . Sinus exploration  1987  . Permanent pacemaker generator change N/A 11/25/2013    Procedure: PERMANENT PACEMAKER GENERATOR CHANGE;  Surgeon: Deboraha Sprang, MD;  Location: [facility] CATH LAB;  Service: Cardiovascular;  Laterality: N/A;  . Cardiac catheterization N/A 12/11/2015    Procedure: Left Heart Cath and Coronary Angiography;  Surgeon: Charolette Forward, MD;  Location: [facility] CV LAB;  Service: Cardiovascular;  Laterality: N/A;    Current Outpatient Prescriptions  Medication Sig Dispense Refill  . amLODipine (NORVASC) 5 MG tablet Take 5 mg by mouth daily.    Marland Kitchen aspirin 81 MG tablet Take 81 mg by mouth 2 (two) times daily.     Marland Kitchen buPROPion (WELLBUTRIN XL) 300 MG 24 hr tablet Take 300 mg by mouth daily. Reported on 03/24/2016    . carvedilol (COREG) 25 MG tablet TAKE 1 TABLET BY MOUTH TWICE A DAY WITH A MEAL 180 tablet 0  . clopidogrel (PLAVIX) 75 MG tablet Take 75 mg by mouth daily.  3  . escitalopram (LEXAPRO) 10 MG tablet Take 10 mg by mouth at bedtime.     . famotidine (PEPCID) 20 MG tablet Take 20 mg by mouth at bedtime.     . gabapentin (NEURONTIN) 300 MG capsule Take 300 mg by mouth 2 (two)  times daily as needed (leg/shoulder pain).     . isosorbide mononitrate (IMDUR) 60 MG 24 hr tablet Take 60 mg by mouth daily.    . nitroGLYCERIN (NITROSTAT) 0.4 MG SL tablet Place 0.4 mg under the tongue every 5 (five) minutes as needed for chest pain.    Marland Kitchen oxyCODONE-acetaminophen (PERCOCET) 10-325 MG tablet Take 1 tablet by mouth every 6 (six) hours as needed for pain.    . pantoprazole (PROTONIX) 40 MG tablet Take 40 mg by mouth 2 (two) times daily.     . rosuvastatin (CRESTOR) 20 MG tablet Take 20 mg by mouth at bedtime.     . sacubitril-valsartan (ENTRESTO) 97-103 MG Take 1 tablet by mouth 2 (two) times daily.     No current facility-administered medications for this visit.    Allergies:   Amlodipine and Tramadol   Social History:  The patient  reports that he quit smoking about 16 years ago. He has never used smokeless tobacco. He reports that he does not drink alcohol or use illicit drugs.   Family History:  The patient's family history includes Heart attack in his brother, father, and mother; Heart disease in his brother, father, and mother.  ROS:  Please see the history of present illness.    All other systems are reviewed and otherwise negative.   PHYSICAL EXAM:  VS:  BP 116/60 mmHg  Pulse 64  Ht 5\' 10"  (1.778 m)  Wt 168 lb (76.204 kg)  BMI 24.11 kg/m2 BMI: Body mass index is 24.11 kg/(m^2). Well nourished, well developed, in no acute distress HEENT: normocephalic, atraumatic Neck: no JVD, carotid bruits or masses Cardiac:  normal S1, S2; RRR; no significant murmurs, no rubs, or gallops Lungs:  clear to auscultation bilaterally, no wheezing, rhonchi or rales Abd: soft, nontender MS: no deformity or atrophy Ext: no edema Skin: warm and dry, no rash Neuro:  No gross deficits are appreciated, speech is clear Psych: euthymic mood, full affect  ICD site (sub-pec) is stable, no tethering or discomfort   EKG:  Done today shows A pace, V sense, PVC ICD check today shows  normal function, no VT, no therapies, no AF or mode switches  12/11/15 LHC Conclusion     Ost RCA to Mid RCA lesion, 100% stenosed.  Ost RPDA lesion, 70% stenosed.  Ost Cx lesion, 60% stenosed.  Prox Cx lesion, 80% stenosed.  Prox Cx to Mid Cx lesion, 100% stenosed.  Ost LM lesion, 40% stenosed.  Prox LAD lesion, 40% stenosed.  Mid LAD-1 lesion, 70% stenosed.  Mid LAD-2 lesion, 100% stenosed.  Findings Left main is 30-40%  diffuse disease LAD has 40-50% proximal and 70% mid stenosis and then 100% occluded filling by LIMA to LAD Left circumflex and 50-60% ostial stenosis and 80% proximal stenosis and then 100% occluded beyond the midportion. OM1 is very small is patent RCA is 100% occluded at the ostium Saphenous vein graft to PDA has 70-80% anastomotic stenosis as before at the trifurcation not suitable for PCI Saphenous vein graft to obtuse marginal was patent LIMA to LAD is patent   10/11/13: Echocardiogram Study Conclusions - Left ventricle: Moderate hypokinesis of base/mid inferior wall and base/mid inferolateral wall. The cavity size was normal. Wall thickness was normal. The estimated ejection fraction was 45%. - Left atrium: The atrium was mildly dilated. - Right ventricle: The cavity size was normal. Pacer wire or catheter noted in right ventricle. Systolic function was mildly reduced.   Recent Labs: 12/12/2015: BUN 20; Creatinine, Ser 1.45*; Hemoglobin 13.0; Platelets 173; Potassium 4.1; Sodium 141  12/11/2015: Cholesterol 156; HDL 47; LDL Cholesterol 87; Total CHOL/HDL Ratio 3.3; Triglycerides 112; VLDL 22   CrCl cannot be calculated (Patient has no serum creatinine result on file.).   Wt Readings from Last 3 Encounters:  04/03/16 168 lb (76.204 kg)  03/24/16 174 lb 4.8 oz (79.062 kg)  03/12/16 178 lb 9.6 oz (81.012 kg)     Other studies reviewed: Additional studies/records reviewed today include: summarized above  Device information: BSCi dual  chamber ICD, implanted 03/14/05, gen change 11/25/13 to sub-pectoral placement, Dr. Caryl Comes  ASSESSMENT AND PLAN:  1. ICM/ICD    Checked 03/12/16 by Dr. Caryl Comes with intact function, no changes were made Today's evaluation, and latittude as well, no arrhythmias of any kind noted, no shocks.  The patient describes a slight twitch sensation to the area at his device, does not describe a shock type episode.  2. CHF    exam is euvolemic    On BB/ARB  3. CAD     S/p cath with Dr. Terrence Dupont as above     On ASA/plavix, BB, statin, nitrate  4. PAFib, older record notes report as a single event 4 minutes duration Aug 2015     Continue to monitor via device     Has had no AF episodes  5. Carotid disease     Dr. Nechama Guard  6.  Orthostatic history      Check is negative today and without symptoms       He has not had syncope of late      Advised to stand slowly and not walk away from his chair right away, avoid long periods of standing as well  7. Symptoms as described     Recommendations below      Disposition: Discussed with Dr. Caryl Comes, the patient is urged to see his neurologist and revisit with vascular ASAP, even Dr. Terrence Dupont as well, any recurrent symptoms to call 911/ER for evaluation.  Will keep his already scheduled EP appointments/remotes.  Current medicines are reviewed at length with the patient today.  The patient did not have any concerns regarding medicines.  Haywood Lasso, PA-C 04/03/2016 3:12 PM     [address] [place] [address] [phone] (office)  [phone] (fax)

## 2016-04-03 ENCOUNTER — Ambulatory Visit (INDEPENDENT_AMBULATORY_CARE_PROVIDER_SITE_OTHER): Payer: Medicare Other | Admitting: Physician Assistant

## 2016-04-03 ENCOUNTER — Encounter: Payer: Self-pay | Admitting: Physician Assistant

## 2016-04-03 VITALS — BP 116/60 | HR 64 | Ht 70.0 in | Wt 168.0 lb

## 2016-04-03 DIAGNOSIS — I2510 Atherosclerotic heart disease of native coronary artery without angina pectoris: Secondary | ICD-10-CM

## 2016-04-03 DIAGNOSIS — I429 Cardiomyopathy, unspecified: Secondary | ICD-10-CM | POA: Diagnosis not present

## 2016-04-03 DIAGNOSIS — I5022 Chronic systolic (congestive) heart failure: Secondary | ICD-10-CM

## 2016-04-03 DIAGNOSIS — G8929 Other chronic pain: Secondary | ICD-10-CM

## 2016-04-03 DIAGNOSIS — I255 Ischemic cardiomyopathy: Secondary | ICD-10-CM

## 2016-04-03 DIAGNOSIS — I739 Peripheral vascular disease, unspecified: Secondary | ICD-10-CM

## 2016-04-03 DIAGNOSIS — I951 Orthostatic hypotension: Secondary | ICD-10-CM

## 2016-04-03 DIAGNOSIS — I10 Essential (primary) hypertension: Secondary | ICD-10-CM

## 2016-04-03 LAB — CUP PACEART INCLINIC DEVICE CHECK
HIGH POWER IMPEDANCE MEASURED VALUE: 50 Ohm
Implantable Lead Implant Date: 20060512
Implantable Lead Implant Date: 20060512
Lead Channel Pacing Threshold Amplitude: 0.6 V
Lead Channel Pacing Threshold Amplitude: 0.8 V
Lead Channel Pacing Threshold Pulse Width: 0.4 ms
Lead Channel Sensing Intrinsic Amplitude: 10.1 mV
Lead Channel Setting Sensing Sensitivity: 0.6 mV
MDC IDC LEAD LOCATION: 753859
MDC IDC LEAD LOCATION: 753860
MDC IDC LEAD MODEL: 158
MDC IDC LEAD SERIAL: 159477
MDC IDC MSMT LEADCHNL RA IMPEDANCE VALUE: 431 Ohm
MDC IDC MSMT LEADCHNL RA SENSING INTR AMPL: 1.9 mV
MDC IDC MSMT LEADCHNL RV IMPEDANCE VALUE: 731 Ohm
MDC IDC MSMT LEADCHNL RV PACING THRESHOLD PULSEWIDTH: 0.4 ms
MDC IDC SESS DTM: 20170601040000
MDC IDC SET LEADCHNL RA PACING AMPLITUDE: 2 V
MDC IDC SET LEADCHNL RV PACING AMPLITUDE: 2.4 V
MDC IDC SET LEADCHNL RV PACING PULSEWIDTH: 0.4 ms
MDC IDC STAT BRADY RA PERCENT PACED: 83 %
MDC IDC STAT BRADY RV PERCENT PACED: 11 %
Pulse Gen Serial Number: 112776

## 2016-04-03 NOTE — Patient Instructions (Addendum)
Medication Instructions:   Your physician recommends that you continue on your current medications as directed. Please refer to the Current Medication list given to you today.    If you need a refill on your cardiac medications before your next appointment, please call your pharmacy.  Labwork:  NONE ORDER TODAY    Testing/Procedures: NONE ORDER TODAY    Follow-Up:  WITH [place] IN ONE YEAR   Any Other Special Instructions Will Be Listed Below (If Applicable).  1. PLEASE FOLLOW UP WITH YOUR NEUROLOGIST OR VASCULAR PROVIDER  AS SOON AS POSSIBLE   2. PLEASE CALL 911 OR GO TO EMERGENCY ROOM  IF ANY OF OUR SYMPTOMS  RETURN

## 2016-04-04 ENCOUNTER — Telehealth: Payer: Self-pay

## 2016-04-04 NOTE — Telephone Encounter (Signed)
Phone call from pt's wife.  Reported the pt. was recently seen in our office for follow-up by the NP, and voiced concerned that the pt. "didn't convey all that was going on with him."  Stated he has been having episodes of confusion, and unsteadiness.  Reported the last episode was on Saturday night.  Stated that the pt. had a period that she couldn't understand him.  Reported that he thought his defibrillator went off, as he c/o a "burning in his chest."  Reported he also "c/o feeling bad."  Stated she gave him a Nitro, and Oxycodone, and he seemed to perk up.  Stated the pt. was seen in Dr. Olin Pia office 5/31, and that he was advised to get an appt. right away with the vasc. surgeon, to further evaluate.  Also, reported that his hands will get cold as ice, and turn white as snow, and his PCP is concerned about Raynaud's.  Advised wife will contact her with an appt. With Dr. Trula Slade, and if his symptoms worsen over the weekend, to go to the ER.

## 2016-04-07 ENCOUNTER — Encounter: Payer: Self-pay | Admitting: Surgery

## 2016-04-07 ENCOUNTER — Ambulatory Visit (INDEPENDENT_AMBULATORY_CARE_PROVIDER_SITE_OTHER): Payer: Medicare Other | Admitting: Surgery

## 2016-04-07 VITALS — BP 83/61 | HR 60 | Temp 97.1°F | Resp 18 | Ht 70.0 in | Wt 170.0 lb

## 2016-04-07 DIAGNOSIS — I255 Ischemic cardiomyopathy: Secondary | ICD-10-CM

## 2016-04-07 DIAGNOSIS — I6523 Occlusion and stenosis of bilateral carotid arteries: Secondary | ICD-10-CM

## 2016-04-07 NOTE — Telephone Encounter (Signed)
Spoke to pt's wife to sch appt 04/07/16 at 3:30.

## 2016-04-07 NOTE — Progress Notes (Signed)
[facility] [place]  Patient name: Connor Wilkerson MRN: [id number] DOB: 1941-06-20 Sex: male  REASON FOR VISIT:  carotid stenosis  HPI: Connor Wilkerson is a 75 y.o. male who I initially saw for syncope and carotid stenosis.  He had multiple syncopal spells and fell on multiple occasions.  He has also had issues with gait instability.  He takes a statin for hypercholesterolemia as well as dual antiplatelet therapy.  He has an ICD in place.  He was recently seen in our office for carotid duplex.  He had a mild increase in the right carotid stenosis which was in the 60-79% category.    This past weekend, his wife describes an episode of dysarthria and garbled speech which lasted approximately an hour.  It did resolve with nitroglycerin and oxycodone.  He felt that his pacemaker had fired.  He was seen by Dr. Caryl Comes who felt everything was okay.  He denies any symptoms in his arms or legs other than some chronic tingling in his arms.  He denies amaurosis fugax  Past Medical History  Diagnosis Date  . Dyspnea   . Ischemic cardiomyopathy   . ICD (implantable cardiac defibrillator)  BSx   . Atrial fibrillation ([place])   . Myocardial infarction ([place])   . Peripheral vascular disease ([place])   . Near syncope 09/21/2014  . Abnormality of gait 02/07/2015    Family History  Problem Relation Age of Onset  . Heart disease Mother   . Heart attack Mother   . Heart disease Father   . Heart attack Father   . Heart disease Brother   . Heart attack Brother     SOCIAL HISTORY: Social History  Substance Use Topics  . Smoking status: Former Smoker    Quit date: 09/19/1999  . Smokeless tobacco: Never Used     Comment: quit in 1998  . Alcohol Use: No     Comment: recovering alcoholic, quit [id number] yrs ago    Allergies  Allergen Reactions  . Amlodipine     Pt not sure if this is an allergy  . Tramadol Other (See Comments)    Unknown      Current Outpatient Prescriptions  Medication Sig Dispense Refill  . amLODipine (NORVASC) 5 MG tablet Take 5 mg by mouth daily.    Marland Kitchen aspirin 81 MG tablet Take 81 mg by mouth 2 (two) times daily.     Marland Kitchen buPROPion (WELLBUTRIN XL) 300 MG 24 hr tablet Take 300 mg by mouth daily. Reported on 03/24/2016    . carvedilol (COREG) 25 MG tablet TAKE 1 TABLET BY MOUTH TWICE A DAY WITH A MEAL 180 tablet 0  . clopidogrel (PLAVIX) 75 MG tablet Take 75 mg by mouth daily.  3  . Cyclobenzaprine HCl (FLEXERIL PO) Take by mouth as needed.    Marland Kitchen escitalopram (LEXAPRO) 10 MG tablet Take 10 mg by mouth at bedtime.     . famotidine (PEPCID) 20 MG tablet Take 20 mg by mouth at bedtime.     . gabapentin (NEURONTIN) 300 MG capsule Take 300 mg by mouth 2 (two) times daily as needed (leg/shoulder pain).     . isosorbide mononitrate (IMDUR) 60 MG 24 hr tablet Take 60 mg by mouth daily.    . nitroGLYCERIN (NITROSTAT) 0.4 MG SL tablet Place 0.4 mg under the tongue every 5 (five) minutes as needed for chest pain.    Marland Kitchen oxyCODONE-acetaminophen (PERCOCET) 10-325 MG tablet Take 1 tablet by mouth  every 6 (six) hours as needed for pain.    . pantoprazole (PROTONIX) 40 MG tablet Take 40 mg by mouth 2 (two) times daily.     . rosuvastatin (CRESTOR) 20 MG tablet Take 20 mg by mouth at bedtime.     . sacubitril-valsartan (ENTRESTO) 97-103 MG Take 1 tablet by mouth 2 (two) times daily.     No current facility-administered medications for this visit.    REVIEW OF SYSTEMS:  [X]  denotes positive finding, [ ]  denotes negative finding Cardiac  Comments:  Chest pain or chest pressure:    Shortness of breath upon exertion: x   Short of breath when lying flat:    Irregular heart rhythm:        Vascular    Pain in calf, thigh, or hip brought on by ambulation:    Pain in feet at night that wakes you up from your sleep:  x   Blood clot in your veins:    Leg swelling:         Pulmonary    Oxygen at home:    Productive cough:      Wheezing:         Neurologic    Sudden weakness in arms or legs:  x   Sudden numbness in arms or legs:     Sudden onset of difficulty speaking or slurred speech: x   Temporary loss of vision in one eye:     Problems with dizziness:  x       Gastrointestinal    Blood in stool:     Vomited blood:         Genitourinary    Burning when urinating:     Blood in urine:        Psychiatric    Major depression:  x       Hematologic    Bleeding problems:    Problems with blood clotting too easily:        Skin    Rashes or ulcers:        Constitutional    Fever or chills:      PHYSICAL EXAM: Filed Vitals:   04/07/16 1607  BP: 83/61  Pulse: 60  Temp: 97.1 F (36.2 C)  TempSrc: Oral  Resp: 18  Height: 5\' 10"  (1.778 m)  Weight: 170 lb (77.111 kg)  SpO2: 97%    GENERAL: The patient is a well-nourished male, in no acute distress. The vital signs are documented above. CARDIAC: There is a regular rate and rhythm.  VASCULAR: No carotid bruit PULMONARY: There is good air exchange bilaterally without wheezing or rales.  MUSCULOSKELETAL: There are no major deformities or cyanosis. NEUROLOGIC: No focal weakness or paresthesias are detected. SKIN: There are no ulcers or rashes noted. PSYCHIATRIC: The patient has a normal affect.  DATA:  I have reviewed his most recent carotid Doppler study which shows 60-79% right carotid stenosis  MEDICAL ISSUES: Dysarthria: Is unclear to me if this represents a TIA or a stroke.  In order to determine this, I feel he needs to have a MRI of the brain.  In addition, I would like more information on the stenosis in his right carotid artery.  I want to see if there is an ulcerated lesion which could possibly be the source of an embolic phenomenon causing his symptoms.  The patient will get the studies as soon as possible and I will contact him with results.  If I feel that the right carotid artery  as the source of the symptoms, we will consider right  carotid endarterectomy.  I have him scheduled to follow with me on Monday.  He will continue with dual antiplatelet therapy.    Annamarie Major, MD [facility] [phone] Pager [phone]

## 2016-04-08 ENCOUNTER — Encounter (HOSPITAL_COMMUNITY): Payer: Self-pay | Admitting: Radiology

## 2016-04-08 ENCOUNTER — Other Ambulatory Visit: Payer: Self-pay

## 2016-04-08 DIAGNOSIS — G459 Transient cerebral ischemic attack, unspecified: Secondary | ICD-10-CM

## 2016-04-08 HISTORY — PX: CARDIAC DEFIBRILLATOR PLACEMENT: SHX171

## 2016-04-08 NOTE — Addendum Note (Signed)
Addended by: Thresa Ross C on: 04/08/2016 11:18 AM   Modules accepted: Orders

## 2016-04-10 ENCOUNTER — Other Ambulatory Visit: Payer: Self-pay | Admitting: Surgery

## 2016-04-10 ENCOUNTER — Ambulatory Visit (INDEPENDENT_AMBULATORY_CARE_PROVIDER_SITE_OTHER): Payer: Medicare Other | Admitting: Neurology

## 2016-04-10 ENCOUNTER — Encounter: Payer: Self-pay | Admitting: Neurology

## 2016-04-10 ENCOUNTER — Ambulatory Visit: Payer: Medicare Other | Admitting: Nurse Practitioner

## 2016-04-10 ENCOUNTER — Other Ambulatory Visit: Payer: Self-pay | Admitting: Neurology

## 2016-04-10 VITALS — BP 98/62 | HR 72 | Wt 171.5 lb

## 2016-04-10 DIAGNOSIS — I255 Ischemic cardiomyopathy: Secondary | ICD-10-CM

## 2016-04-10 DIAGNOSIS — R55 Syncope and collapse: Secondary | ICD-10-CM

## 2016-04-10 DIAGNOSIS — R269 Unspecified abnormalities of gait and mobility: Secondary | ICD-10-CM | POA: Diagnosis not present

## 2016-04-10 DIAGNOSIS — E538 Deficiency of other specified B group vitamins: Secondary | ICD-10-CM | POA: Diagnosis not present

## 2016-04-10 NOTE — Progress Notes (Signed)
Reason for visit: Transient speech disturbance, falls  Connor Wilkerson is an 75 y.o. male  History of present illness:  Connor Wilkerson is a 75 year old right-handed white male with a history of a chronic gait disorder and episodes of syncope in the past. The patient has not had any recent events of syncope, but he continues to fall frequently. The episodes of falls have gradually increased over the last 2 months. The patient has been falling 2 or 3 times a day, fortunately he has not sustained any injury with the falls. The patient reports that he has a split second warning that he is going to fall, and oftentimes his legs will collapse, he does not topple. He does not use a cane or a walker for ambulation. He does have some generalized balance issues with some staggering. His wife has documented that his blood pressures have been in the 80 systolic range recently. He reports no definite palpitations of the heart prior to a fall, he has a defibrillator in place. The patient is not sleeping well at night, he will nap during the day. His right foot will tingle at night, he has to get up and walk to alleviate the symptoms. He has Raynaud's phenomena involving both hands. He has been told to come off of the Norvasc blood pressure medication, today is his last day on the medication. The patient reports an episode that occurred last week when he was coming out of sleep. He was somewhat confused, and was having garbled confused speech. This lasted about 30 minutes and then cleared. No other focal associated symptoms were noted. The patient is been set up for a CT angiogram of the neck. He is sent to this office for an evaluation.  Past Medical History  Diagnosis Date  . Dyspnea   . Ischemic cardiomyopathy   . ICD (implantable cardiac defibrillator)  BSx   . Atrial fibrillation ([place])   . Myocardial infarction ([place])   . Peripheral vascular disease ([place])   . Near syncope 09/21/2014  . Abnormality of gait  02/07/2015    Past Surgical History  Procedure Laterality Date  . Cardiac defibrillator placement    . Coronary artery bypass graft    . Hernia repair      x2  . Rotator cuff repair Right 2006  . Elbow surgery Left     plastic bone replacement  . Sinus exploration  1987  . Permanent pacemaker generator change N/A 11/25/2013    Procedure: PERMANENT PACEMAKER GENERATOR CHANGE;  Surgeon: Deboraha Sprang, MD;  Location: [facility] CATH LAB;  Service: Cardiovascular;  Laterality: N/A;  . Cardiac catheterization N/A 12/11/2015    Procedure: Left Heart Cath and Coronary Angiography;  Surgeon: Charolette Forward, MD;  Location: [place] CV LAB;  Service: Cardiovascular;  Laterality: N/A;  . Cardiac defibrillator placement  04/08/2016    NOT MRI SAFE (DOCUMENT SCANNED IN SYSTEM    Family History  Problem Relation Age of Onset  . Heart disease Mother   . Heart attack Mother   . Heart disease Father   . Heart attack Father   . Heart disease Brother   . Heart attack Brother     Social history:  reports that he quit smoking about 16 years ago. He has never used smokeless tobacco. He reports that he does not drink alcohol or use illicit drugs.    Allergies  Allergen Reactions  . Amlodipine     Pt not sure if this is an  allergy  . Tramadol Other (See Comments)    Unknown     Medications:  Prior to Admission medications   Medication Sig Start Date End Date Taking? Authorizing Provider  amLODipine (NORVASC) 5 MG tablet Take 5 mg by mouth daily.    Historical Provider, MD  aspirin 81 MG tablet Take 81 mg by mouth 2 (two) times daily.     Historical Provider, MD  buPROPion (WELLBUTRIN XL) 300 MG 24 hr tablet Take 300 mg by mouth daily. Reported on 03/24/2016    Historical Provider, MD  carvedilol (COREG) 25 MG tablet TAKE 1 TABLET BY MOUTH TWICE A DAY WITH A MEAL 02/26/16   Deboraha Sprang, MD  clopidogrel (PLAVIX) 75 MG tablet Take 75 mg by mouth daily. 11/23/15   Historical Provider, MD  Cyclobenzaprine  HCl (FLEXERIL PO) Take by mouth as needed.    Historical Provider, MD  escitalopram (LEXAPRO) 10 MG tablet Take 10 mg by mouth at bedtime.     Historical Provider, MD  famotidine (PEPCID) 20 MG tablet Take 20 mg by mouth at bedtime.     Historical Provider, MD  gabapentin (NEURONTIN) 300 MG capsule Take 300 mg by mouth 2 (two) times daily as needed (leg/shoulder pain).     Historical Provider, MD  isosorbide mononitrate (IMDUR) 60 MG 24 hr tablet Take 60 mg by mouth daily. 03/10/16   Historical Provider, MD  nitroGLYCERIN (NITROSTAT) 0.4 MG SL tablet Place 0.4 mg under the tongue every 5 (five) minutes as needed for chest pain.    Historical Provider, MD  oxyCODONE-acetaminophen (PERCOCET) 10-325 MG tablet Take 1 tablet by mouth every 6 (six) hours as needed for pain.    Historical Provider, MD  pantoprazole (PROTONIX) 40 MG tablet Take 40 mg by mouth 2 (two) times daily.     Historical Provider, MD  rosuvastatin (CRESTOR) 20 MG tablet Take 20 mg by mouth at bedtime.     Historical Provider, MD  sacubitril-valsartan (ENTRESTO) 97-103 MG Take 1 tablet by mouth 2 (two) times daily.    Historical Provider, MD    ROS:  Out of a complete 14 system review of symptoms, the patient complains only of the following symptoms, and all other reviewed systems are negative.  Decreased activity, chills, fatigue, weight loss Blurred vision Cold intolerance, excessive thirst Restless legs, insomnia, daytime sleepiness Walking difficulty, neck pain Bruising easily Memory loss, dizziness, headache, speech difficulty, weakness  Blood pressure 98/62, pulse 72, weight 171 lb 8 oz (77.792 kg).   Blood pressure, right arm, sitting is 116/80. Blood pressure, standing, right arm is 92 systolic.  Physical Exam  General: The patient is alert and cooperative at the time of the examination.  Neck: Neck is supple, no carotid bruits are noted.  Respiratory: Lung fields are clear.  Cardiovascular: Regular rate and  rhythm, no murmurs noted.  Skin: No significant peripheral edema is noted.   Neurologic Exam  Mental status: The patient is alert and oriented x 3 at the time of the examination. The patient has apparent normal recent and remote memory, with an apparently normal attention span and concentration ability.   Cranial nerves: Facial symmetry is present. Speech is normal, no aphasia or dysarthria is noted. Extraocular movements are full. Visual fields are full. Pupils are equal, round, and reactive to light. Discs are flat bilaterally.  Motor: The patient has good strength in all 4 extremities.  Sensory examination: Soft touch sensation is symmetric on the face, arms, and legs. There is  a stocking pattern pinprick sensory deficit up to the knees bilaterally with the legs, decreased pinprick sensation in the right leg is more prominent than in the left. Vibration sensation is present on all fours. Position sensation is intact on all fours.  Coordination: The patient has good finger-nose-finger and heel-to-shin bilaterally.  Gait and station: The patient has a wide-based, somewhat unsteady gait. Tandem gait is unsteady. Romberg is negative.  Reflexes: Deep tendon reflexes are symmetric, but are slightly depressed. Toes are neutral bilaterally.   Assessment/Plan:  1. Gait instability, multiple falls  2. History of syncope  3. Transient speech disturbance, confusion, coming out of sleep  The patient appears to have some drop in blood pressure today, he may have some orthostasis that may be promoting the falls. The patient reports a collapse episode suggestive of a lowered blood pressure. He is having multiple events, several daily. The patient has been set up for a CT angiogram of the neck. I will set the patient up for EEG evaluation, and blood work today. The patient will be taken off of Norvasc, and the wife will monitor blood pressures at home. I will follow-up in 2 or 3 months. The patient  will be set up for physical therapy for gait training.  Jill Alexanders MD 04/10/2016 6:08 PM  [facility] [address] [place], [place] [address]  Phone [phone] Fax [phone]

## 2016-04-11 ENCOUNTER — Emergency Department (HOSPITAL_COMMUNITY): Payer: Medicare Other

## 2016-04-11 ENCOUNTER — Inpatient Hospital Stay (HOSPITAL_COMMUNITY)
Admission: EM | Admit: 2016-04-11 | Discharge: 2016-04-13 | DRG: 683 | Disposition: A | Discharge status: Home / Self Care | Payer: Medicare Other | Attending: Internal Medicine | Admitting: Internal Medicine

## 2016-04-11 ENCOUNTER — Encounter: Payer: Self-pay | Admitting: Surgery

## 2016-04-11 ENCOUNTER — Encounter (HOSPITAL_COMMUNITY): Payer: Self-pay

## 2016-04-11 ENCOUNTER — Inpatient Hospital Stay: Admission: RE | Admit: 2016-04-11 | Payer: Medicare Other | Source: Ambulatory Visit

## 2016-04-11 DIAGNOSIS — R42 Dizziness and giddiness: Secondary | ICD-10-CM | POA: Diagnosis not present

## 2016-04-11 DIAGNOSIS — I4891 Unspecified atrial fibrillation: Secondary | ICD-10-CM | POA: Diagnosis present

## 2016-04-11 DIAGNOSIS — E875 Hyperkalemia: Secondary | ICD-10-CM | POA: Diagnosis not present

## 2016-04-11 DIAGNOSIS — Z87891 Personal history of nicotine dependence: Secondary | ICD-10-CM

## 2016-04-11 DIAGNOSIS — N183 Chronic kidney disease, stage 3 (moderate): Secondary | ICD-10-CM | POA: Diagnosis present

## 2016-04-11 DIAGNOSIS — I130 Hypertensive heart and chronic kidney disease with heart failure and stage 1 through stage 4 chronic kidney disease, or unspecified chronic kidney disease: Secondary | ICD-10-CM | POA: Diagnosis present

## 2016-04-11 DIAGNOSIS — I959 Hypotension, unspecified: Secondary | ICD-10-CM

## 2016-04-11 DIAGNOSIS — N179 Acute kidney failure, unspecified: Secondary | ICD-10-CM | POA: Diagnosis not present

## 2016-04-11 DIAGNOSIS — Z7982 Long term (current) use of aspirin: Secondary | ICD-10-CM

## 2016-04-11 DIAGNOSIS — I252 Old myocardial infarction: Secondary | ICD-10-CM | POA: Diagnosis not present

## 2016-04-11 DIAGNOSIS — Z95810 Presence of automatic (implantable) cardiac defibrillator: Secondary | ICD-10-CM | POA: Diagnosis not present

## 2016-04-11 DIAGNOSIS — R7989 Other specified abnormal findings of blood chemistry: Secondary | ICD-10-CM

## 2016-04-11 DIAGNOSIS — Z7902 Long term (current) use of antithrombotics/antiplatelets: Secondary | ICD-10-CM | POA: Diagnosis not present

## 2016-04-11 DIAGNOSIS — I2510 Atherosclerotic heart disease of native coronary artery without angina pectoris: Secondary | ICD-10-CM | POA: Diagnosis present

## 2016-04-11 DIAGNOSIS — J9811 Atelectasis: Secondary | ICD-10-CM | POA: Diagnosis not present

## 2016-04-11 DIAGNOSIS — I739 Peripheral vascular disease, unspecified: Secondary | ICD-10-CM | POA: Diagnosis present

## 2016-04-11 DIAGNOSIS — I5042 Chronic combined systolic (congestive) and diastolic (congestive) heart failure: Secondary | ICD-10-CM | POA: Diagnosis present

## 2016-04-11 DIAGNOSIS — Z951 Presence of aortocoronary bypass graft: Secondary | ICD-10-CM | POA: Diagnosis not present

## 2016-04-11 DIAGNOSIS — I951 Orthostatic hypotension: Secondary | ICD-10-CM | POA: Diagnosis present

## 2016-04-11 DIAGNOSIS — R0600 Dyspnea, unspecified: Secondary | ICD-10-CM | POA: Diagnosis not present

## 2016-04-11 DIAGNOSIS — I9589 Other hypotension: Secondary | ICD-10-CM | POA: Diagnosis not present

## 2016-04-11 DIAGNOSIS — I255 Ischemic cardiomyopathy: Secondary | ICD-10-CM | POA: Diagnosis present

## 2016-04-11 DIAGNOSIS — R0902 Hypoxemia: Secondary | ICD-10-CM | POA: Diagnosis not present

## 2016-04-11 DIAGNOSIS — R791 Abnormal coagulation profile: Secondary | ICD-10-CM | POA: Diagnosis not present

## 2016-04-11 LAB — BASIC METABOLIC PANEL
Anion gap: 5 (ref 5–15)
BUN: 45 mg/dL — AB (ref 6–20)
CHLORIDE: 110 mmol/L (ref 101–111)
CO2: 21 mmol/L — AB (ref 22–32)
CREATININE: 2.57 mg/dL — AB (ref 0.61–1.24)
Calcium: 9 mg/dL (ref 8.9–10.3)
GFR calc Af Amer: 27 mL/min — ABNORMAL LOW (ref >60)
GFR calc non Af Amer: 23 mL/min — ABNORMAL LOW (ref >60)
GLUCOSE: 117 mg/dL — AB (ref 65–99)
POTASSIUM: 6 mmol/L — AB (ref 3.5–5.1)
Sodium: 136 mmol/L (ref 135–145)

## 2016-04-11 LAB — I-STAT CHEM 8, ED
BUN: 42 mg/dL — ABNORMAL HIGH (ref 6–20)
CALCIUM ION: 1.25 mmol/L (ref 1.13–1.30)
CHLORIDE: 109 mmol/L (ref 101–111)
Creatinine, Ser: 2.4 mg/dL — ABNORMAL HIGH (ref 0.61–1.24)
Glucose, Bld: 102 mg/dL — ABNORMAL HIGH (ref 65–99)
HEMATOCRIT: 33 % — AB (ref 39.0–52.0)
Hemoglobin: 11.2 g/dL — ABNORMAL LOW (ref 13.0–17.0)
Potassium: 6 mmol/L — ABNORMAL HIGH (ref 3.5–5.1)
SODIUM: 139 mmol/L (ref 135–145)
TCO2: 21 mmol/L (ref 0–100)

## 2016-04-11 LAB — CBC
HEMATOCRIT: 35.4 % — AB (ref 39.0–52.0)
Hemoglobin: 11.2 g/dL — ABNORMAL LOW (ref 13.0–17.0)
MCH: 31.3 pg (ref 26.0–34.0)
MCHC: 31.6 g/dL (ref 30.0–36.0)
MCV: 98.9 fL (ref 78.0–100.0)
Platelets: 192 10*3/uL (ref 150–400)
RBC: 3.58 MIL/uL — ABNORMAL LOW (ref 4.22–5.81)
RDW: 12.5 % (ref 11.5–15.5)
WBC: 8.5 10*3/uL (ref 4.0–10.5)

## 2016-04-11 LAB — D-DIMER, QUANTITATIVE: D-Dimer, Quant: 1.03 ug/mL-FEU — ABNORMAL HIGH (ref 0.00–0.50)

## 2016-04-11 LAB — LACTIC ACID, PLASMA
LACTIC ACID, VENOUS: 1.1 mmol/L (ref 0.5–2.0)
Lactic Acid, Venous: 1.2 mmol/L (ref 0.5–2.0)

## 2016-04-11 LAB — TROPONIN I: Troponin I: <0.03 ng/mL (ref <0.031)

## 2016-04-11 LAB — TSH: TSH: 3.776 u[IU]/mL (ref 0.350–4.500)

## 2016-04-11 LAB — BRAIN NATRIURETIC PEPTIDE: B NATRIURETIC PEPTIDE 5: 131.7 pg/mL — AB (ref 0.0–100.0)

## 2016-04-11 MED ORDER — ENOXAPARIN SODIUM 30 MG/0.3ML ~~LOC~~ SOLN
30.0000 mg | SUBCUTANEOUS | Status: DC
  Administered 2016-04-11 – 2016-04-12 (×2): 30 mg via SUBCUTANEOUS
  Filled 2016-04-11 (×2): qty 0.3

## 2016-04-11 MED ORDER — ASPIRIN EC 81 MG PO TBEC
81.0000 mg | DELAYED_RELEASE_TABLET | ORAL | Status: DC
  Administered 2016-04-12 – 2016-04-13 (×2): 81 mg via ORAL
  Filled 2016-04-11 (×2): qty 1

## 2016-04-11 MED ORDER — NITROGLYCERIN 0.4 MG SL SUBL: 0.4000 mg | SUBLINGUAL_TABLET | SUBLINGUAL | Status: DC | PRN

## 2016-04-11 MED ORDER — SODIUM CHLORIDE 0.9 % IV BOLUS (SEPSIS)
500.0000 mL | Freq: Once | INTRAVENOUS | Status: AC
  Administered 2016-04-11: 500 mL via INTRAVENOUS

## 2016-04-11 MED ORDER — ROSUVASTATIN CALCIUM 20 MG PO TABS
20.0000 mg | ORAL_TABLET | Freq: Every day | ORAL | Status: DC
  Administered 2016-04-11 – 2016-04-12 (×2): 20 mg via ORAL
  Filled 2016-04-11 (×2): qty 1
  Filled 2016-04-11 (×2): qty 2

## 2016-04-11 MED ORDER — SODIUM CHLORIDE 0.9% FLUSH
3.0000 mL | Freq: Two times a day (BID) | INTRAVENOUS | Status: DC
  Administered 2016-04-12: 3 mL via INTRAVENOUS

## 2016-04-11 MED ORDER — CLOPIDOGREL BISULFATE 75 MG PO TABS
75.0000 mg | ORAL_TABLET | Freq: Every day | ORAL | Status: DC
  Administered 2016-04-11 – 2016-04-12 (×2): 75 mg via ORAL
  Filled 2016-04-11 (×2): qty 1

## 2016-04-11 MED ORDER — ESCITALOPRAM OXALATE 10 MG PO TABS
10.0000 mg | ORAL_TABLET | Freq: Every day | ORAL | Status: DC
  Administered 2016-04-12 – 2016-04-13 (×2): 10 mg via ORAL
  Filled 2016-04-11 (×2): qty 1

## 2016-04-11 MED ORDER — SODIUM POLYSTYRENE SULFONATE 15 GM/60ML PO SUSP
15.0000 g | Freq: Once | ORAL | Status: AC
  Administered 2016-04-11: 15 g via ORAL
  Filled 2016-04-11: qty 60

## 2016-04-11 MED ORDER — GABAPENTIN 300 MG PO CAPS
300.0000 mg | ORAL_CAPSULE | Freq: Every evening | ORAL | Status: DC | PRN
Start: 2016-04-11 — End: 2016-04-13
  Administered 2016-04-12: 300 mg via ORAL

## 2016-04-11 MED ORDER — OXYCODONE-ACETAMINOPHEN 10-325 MG PO TABS: 1.0000 | ORAL_TABLET | Freq: Three times a day (TID) | ORAL | Status: DC | PRN

## 2016-04-11 MED ORDER — FAMOTIDINE 20 MG PO TABS
20.0000 mg | ORAL_TABLET | Freq: Two times a day (BID) | ORAL | Status: DC
  Administered 2016-04-11 – 2016-04-13 (×4): 20 mg via ORAL
  Filled 2016-04-11 (×4): qty 1

## 2016-04-11 MED ORDER — OXYCODONE-ACETAMINOPHEN 5-325 MG PO TABS
1.0000 | ORAL_TABLET | Freq: Three times a day (TID) | ORAL | Status: DC | PRN
  Administered 2016-04-13: 1 via ORAL
  Filled 2016-04-11: qty 1

## 2016-04-11 MED ORDER — ESCITALOPRAM OXALATE 10 MG PO TABS
10.0000 mg | ORAL_TABLET | Freq: Every day | ORAL | Status: DC
  Filled 2016-04-11: qty 1

## 2016-04-11 MED ORDER — GABAPENTIN 300 MG PO CAPS
300.0000 mg | ORAL_CAPSULE | Freq: Two times a day (BID) | ORAL | Status: DC | PRN
  Filled 2016-04-11: qty 1

## 2016-04-11 MED ORDER — SODIUM CHLORIDE 0.9 % IV SOLN
Freq: Once | INTRAVENOUS | Status: AC
  Administered 2016-04-11: 19:00:00 via INTRAVENOUS

## 2016-04-11 MED ORDER — CYCLOBENZAPRINE HCL 10 MG PO TABS: 5.0000 mg | ORAL_TABLET | Freq: Three times a day (TID) | ORAL | Status: DC | PRN

## 2016-04-11 MED ORDER — OXYCODONE HCL 5 MG PO TABS: 5.0000 mg | ORAL_TABLET | Freq: Four times a day (QID) | ORAL | Status: DC | PRN

## 2016-04-11 MED ORDER — SODIUM CHLORIDE 0.9 % IV BOLUS (SEPSIS): 1000.0000 mL | Freq: Once | INTRAVENOUS | Status: DC

## 2016-04-11 MED ORDER — BUPROPION HCL ER (XL) 150 MG PO TB24
300.0000 mg | ORAL_TABLET | Freq: Every day | ORAL | Status: DC
  Administered 2016-04-11 – 2016-04-12 (×2): 300 mg via ORAL
  Filled 2016-04-11 (×2): qty 2

## 2016-04-11 NOTE — H&P (Signed)
History and Physical  Connor Wilkerson [id number] DOB: November 27, 1940 DOA: 04/11/2016  PCP:  Ernestene Kiel, MD   Chief Complaint:  Dizziness and hypotension   History of Present Illness:  - Patient is a 75 yo male with history of CAD s/p CABG and ICD, carotid stenosis, CHF, CKD III, PVD who was brought with cc of dizziness and hypotension. Hypotension was detected today by wife 62/37 who called EMS. - patient has been complaining of dizziness and imbalance with recurrent falls for a couple of weeks for which he saw his neurologist and cardiologist with plan for further testing. There was no arrythemia noted after ICD interrogation by cardiology. - Patient said he also had some dyspnea but no chest pain. No fever, chills, cough, diarrhea, wounds, dysuria. No other complaints. No orthopnea or PND.   Review of Systems:  CONSTITUTIONAL:     No night sweats.  +fatigue.  No fever. No chills. Eyes:                            No visual changes.  No eye pain.  No eye discharge.   ENT:                              No epistaxis.  No sinus pain.  No sore throat.   No congestion. RESPIRATORY:           No cough.  No wheeze.  No hemoptysis.  +dyspnea CARDIOVASCULAR   :  No chest pains.  No palpitations. GASTROINTESTINAL:  No abdominal pain.  No nausea. No vomiting.  No diarrhea. No constipation.  No hematemesis.  No hematochezia.  No melena. GENITOURINARY:      No urgency.  No frequency.  No dysuria.  No hematuria.  No obstructive symptoms.  No discharge.  No pain.   MUSCULOSKELETAL:  +musculoskeletal pain.  No joint swelling.  No arthritis. NEUROLOGICAL:        No confusion.  No weakness. No headache. No seizure. PSYCHIATRIC:             No depression. No anxiety. No suicidal ideation. SKIN:                             No rashes.  No lesions.  No wounds. ENDOCRINE:                No weight loss.  No polydipsia.  No polyuria.  No polyphagia. HEMATOLOGIC:           No purpura.  No  petechiae.  No bleeding.  ALLERGIC                 : No pruritus.  No angioedema Other:  Past Medical and Surgical History:   Past Medical History  Diagnosis Date  . Dyspnea   . Ischemic cardiomyopathy   . ICD (implantable cardiac defibrillator)  BSx   . Atrial fibrillation ([place])   . Myocardial infarction ([place])   . Peripheral vascular disease ([place])   . Near syncope 09/21/2014  . Abnormality of gait 02/07/2015   Past Surgical History  Procedure Laterality Date  . Cardiac defibrillator placement    . Coronary artery bypass graft    . Hernia repair      x2  . Rotator cuff repair Right 2006  . Elbow surgery Left     plastic  bone replacement  . Sinus exploration  1987  . Permanent pacemaker generator change N/A 11/25/2013    Procedure: PERMANENT PACEMAKER GENERATOR CHANGE;  Surgeon: Deboraha Sprang, MD;  Location: [facility] C[facility] LAB;  Service: Cardiovascular;  Laterality: N/A;  . Cardiac catheterization N/A 12/11/2015    Procedure: Left Heart Cath and Coronary Angiography;  Surgeon: Charolette Forward, MD;  Location: [place] CV LAB;  Service: Cardiovascular;  Laterality: N/A;  . Cardiac defibrillator placement  04/08/2016    NOT MRI SAFE (DOCUMENT SCANNED IN SYSTEM    Social History:   reports that he quit smoking about 16 years ago. He has never used smokeless tobacco. He reports that he does not drink alcohol or use illicit drugs.    Allergies  Allergen Reactions  . Amlodipine     Pt not sure if this is an allergy  . Tramadol Other (See Comments)    Unknown     Family History  Problem Relation Age of Onset  . Heart disease Mother   . Heart attack Mother   . Heart disease Father   . Heart attack Father   . Heart disease Brother   . Heart attack Brother       Prior to Admission medications   Medication Sig Start Date End Date Taking? Authorizing Provider  amLODipine (NORVASC) 5 MG tablet Take 5 mg by mouth at bedtime.    Yes Historical Provider, MD  aspirin 81 MG tablet Take  81 mg by mouth every morning.    Yes Historical Provider, MD  buPROPion (WELLBUTRIN XL) 300 MG 24 hr tablet Take 300 mg by mouth at bedtime. Reported on 03/24/2016   Yes Historical Provider, MD  carvedilol (COREG) 25 MG tablet TAKE 1 TABLET BY MOUTH TWICE A DAY WITH A MEAL 02/26/16  Yes Deboraha Sprang, MD  clopidogrel (PLAVIX) 75 MG tablet Take 75 mg by mouth at bedtime.  11/23/15  Yes Historical Provider, MD  cyclobenzaprine (FLEXERIL) 5 MG tablet Take 5 mg by mouth 3 (three) times daily as needed for muscle spasms.  03/28/16  Yes Historical Provider, MD  escitalopram (LEXAPRO) 10 MG tablet Take 10 mg by mouth at bedtime.    Yes Historical Provider, MD  famotidine (PEPCID) 20 MG tablet Take 20 mg by mouth 2 (two) times daily.    Yes Historical Provider, MD  gabapentin (NEURONTIN) 300 MG capsule Take 300-600 mg by mouth 3 (three) times daily as needed (leg/shoulder pain). May need 600 mg in the evening at night   Yes Historical Provider, MD  isosorbide mononitrate (IMDUR) 60 MG 24 hr tablet Take 60 mg by mouth at bedtime.  03/10/16  Yes Historical Provider, MD  nitroGLYCERIN (NITROSTAT) 0.4 MG SL tablet Place 0.4 mg under the tongue every 5 (five) minutes as needed for chest pain.   Yes Historical Provider, MD  oxyCODONE-acetaminophen (PERCOCET) 10-325 MG tablet Take 1 tablet by mouth 2 (two) times daily as needed for pain.    Yes Historical Provider, MD  rosuvastatin (CRESTOR) 20 MG tablet Take 20 mg by mouth at bedtime.    Yes Historical Provider, MD  sacubitril-valsartan (ENTRESTO) 97-103 MG Take 1 tablet by mouth 2 (two) times daily.   Yes Historical Provider, MD  spironolactone (ALDACTONE) 25 MG tablet Take 25 mg by mouth at bedtime. 04/02/16  Yes Historical Provider, MD    Physical Exam: BP 94/59 mmHg  Pulse 58  Temp(Src) 97.6 F (36.4 C) (Oral)  Resp 19  Ht 5\' 10"  (1.778 m)  Wt 77.111 kg (170 lb)  BMI 24.39 kg/m2  SpO2 96%  GENERAL :   Alert and cooperative, and appears to be in no acute  distress. HEAD:           normocephalic. EYES:            PERRL, EOMI.  vision is grossly intact. EARS:           hearing grossly intact. NOSE:           No nasal discharge. THROAT:     Oral cavity and pharynx normal.   NECK:          supple, non-tender.  CARDIAC:    Normal S1 and S2.  Distant heart sounds.  Vascular:     no peripheral edema. Extremities are warm and well perfused. No carotid bruits. LUNGS:       Clear to auscultation  ABDOMEN: Positive bowel sounds. Soft, nondistended, nontender. No guarding or rebound.      MSK:           No joint erythema or tenderness.  EXT           : No significant deformity or joint abnormality. Neuro        : Alert, oriented to person, place, and time.                      CN II-XII intact.  SKIN:            No rash. No lesions. PSYCH:       No hallucination. Patient is not suicidal.          Labs on Admission:  Reviewed.   Radiological Exams on Admission: Dg Chest 2 View  04/11/2016  CLINICAL DATA:  Shortness of breath, status post gastric bypass surgery EXAM: CHEST  2 VIEW COMPARISON:  05/19/2013 FINDINGS: Cardiomediastinal silhouette is stable. There is mild elevation of the right hemidiaphragm. Dual lead cardiac pacemaker is unchanged in position. Linear atelectasis or scarring noted right midlung. Mild linear atelectasis or scarring left base. No infiltrate or pulmonary edema. Status post CABG. IMPRESSION: No active disease. Status post CABG. Dual lead cardiac pacemaker in place. Mild elevation of the right hemidiaphragm. Linear atelectasis right midlung and left base. Electronically Signed   By: Lahoma Crocker M.D.   On: 04/11/2016 16:01    EKG:  Independently reviewed. Sinus .   Assessment/Plan  Hypotension with dizziness:  DDx:  - Cardiac : structural heart disease ( AS, MS) : will check echo                     or arrhythmia ( tachy or brady): keep on tele ( unlikely per EP recently)                    No chest pain: unlikely ACS.  Initial trop/EKG non ischemic -  orthostatic hypotension. - Neurologic : seizure vs. Stroke vs. TIA    May need CT angio before discharge. No focal sx on exam.     He did complain of bilateral numbness/coldness in his feet/hands: may need PVD eval with [place] before discharge or with vascular as outpatient.    check EEG in am - Vascular : will check carotid US.  Tx: hold BP meds, gently boluses as needed, admit to Tele.  AKI on CKD: possibly prerenal ( hypotension) w/wo iatrogenic.  Hold ARBS  Hyperkalemia: hold spironolactone. Kayxalate for now, repeat K in am.   CHF:  Appears to be compensated Will check Echo Holding BB/ARBS/Imdur due to hypotension  HTN: holding meds.     Input & Output: ordered  Lines & Tubes: PIV DVT prophylaxis:  [place] enoxaparin  GI prophylaxis: NA Consultants: Cardio Code Status: Full  Family Communication: wife at bedside Disposition Plan: Milinda Pointer M.D [facility]

## 2016-04-11 NOTE — ED Notes (Signed)
Pt is aware urine is needed, urinal at bedside.  

## 2016-04-11 NOTE — ED Provider Notes (Signed)
CSN: [id number]     Arrival date & time 04/11/16  1421 History   First MD Initiated Contact with Patient 04/11/16 1500     Chief Complaint  Patient presents with  . Weakness  . Shortness of Breath   Patient is a 75 y.o. male presenting with shortness of breath.  Shortness of Breath Severity:  Mild Onset quality:  Gradual Timing:  Constant Progression:  Unchanged Chronicity:  New Context: URI (sore throat)   Context: not activity   Relieved by:  None tried Worsened by:  Nothing tried Ineffective treatments:  None tried Associated symptoms: cough and sore throat   Associated symptoms: no abdominal pain, no chest pain, no ear pain, no fever, no headaches, no hemoptysis, no neck pain, no sputum production, no syncope (near syncope) and no wheezing    Pt woke up this morning and had been ok, had had breakfast and wife checked bp a nd it was 62/37. Yesterday it was also low, in the 82/59  Pt saw multiple providers for sx for a month, urologist and pcp dc the norvasc x2 day, which he was taking 5mg . Spironolactone decreased to 1/2 tablet (from 25mg ). Pt also takes imdur 60mg , carvedilol 25mg  bid, no changes. Saturday night pt did take nitro on Saturday night however, felt his defibrillator felt like it shocked him but his cardiologist stated it had not. Pt also had taken some oxycodone/flexeril - but none recently.  No fevers, no chills. Minimal cough - productive and sore throat. 3 days. No dysuria, No rash.  No cp recently.  Today pt felt light headed this am.  No abdominal pain, no back pain, no diarrhea.  Pt not hungry, 11lb weight loss in last month  No rectal bleeding  Past Medical History  Diagnosis Date  . Dyspnea   . Ischemic cardiomyopathy   . ICD (implantable cardiac defibrillator)  BSx   . Atrial fibrillation ([place])   . Myocardial infarction ([place])   . Peripheral vascular disease ([place])   . Near syncope 09/21/2014  . Abnormality of gait 02/07/2015   Past Surgical History   Procedure Laterality Date  . Cardiac defibrillator placement    . Coronary artery bypass graft    . Hernia repair      x2  . Rotator cuff repair Right 2006  . Elbow surgery Left     plastic bone replacement  . Sinus exploration  1987  . Permanent pacemaker generator change N/A 11/25/2013    Procedure: PERMANENT PACEMAKER GENERATOR CHANGE;  Surgeon: Deboraha Sprang, MD;  Location: [facility] CATH LAB;  Service: Cardiovascular;  Laterality: N/A;  . Cardiac catheterization N/A 12/11/2015    Procedure: Left Heart Cath and Coronary Angiography;  Surgeon: Charolette Forward, MD;  Location: [facility] CV LAB;  Service: Cardiovascular;  Laterality: N/A;  . Cardiac defibrillator placement  04/08/2016    NOT MRI SAFE (DOCUMENT SCANNED IN SYSTEM   Family History  Problem Relation Age of Onset  . Heart disease Mother   . Heart attack Mother   . Heart disease Father   . Heart attack Father   . Heart disease Brother   . Heart attack Brother    Social History  Substance Use Topics  . Smoking status: Former Smoker    Quit date: 09/19/1999  . Smokeless tobacco: Never Used     Comment: quit in 1998  . Alcohol Use: No     Comment: recovering alcoholic, quit [id number] yrs ago    Review of Systems  Constitutional: Negative for fever.  HENT: Positive for sore throat. Negative for ear pain.   Respiratory: Positive for cough and shortness of breath. Negative for hemoptysis, sputum production and wheezing.   Cardiovascular: Negative for chest pain and syncope (near syncope).  Gastrointestinal: Negative for abdominal pain.  Musculoskeletal: Negative for neck pain.  Neurological: Negative for headaches.  All other systems reviewed and are negative.     Allergies  Amlodipine and Tramadol  Home Medications   Prior to Admission medications   Medication Sig Start Date End Date Taking? Authorizing Provider  amLODipine (NORVASC) 5 MG tablet Take 5 mg by mouth at bedtime.    Yes Historical Provider, MD  aspirin 81  MG tablet Take 81 mg by mouth every morning.    Yes Historical Provider, MD  buPROPion (WELLBUTRIN XL) 300 MG 24 hr tablet Take 300 mg by mouth at bedtime. Reported on 03/24/2016   Yes Historical Provider, MD  carvedilol (COREG) 25 MG tablet TAKE 1 TABLET BY MOUTH TWICE A DAY WITH A MEAL 02/26/16  Yes Deboraha Sprang, MD  clopidogrel (PLAVIX) 75 MG tablet Take 75 mg by mouth at bedtime.  11/23/15  Yes Historical Provider, MD  cyclobenzaprine (FLEXERIL) 5 MG tablet Take 5 mg by mouth 3 (three) times daily as needed for muscle spasms.  03/28/16  Yes Historical Provider, MD  escitalopram (LEXAPRO) 10 MG tablet Take 10 mg by mouth at bedtime.    Yes Historical Provider, MD  famotidine (PEPCID) 20 MG tablet Take 20 mg by mouth 2 (two) times daily.    Yes Historical Provider, MD  gabapentin (NEURONTIN) 300 MG capsule Take 300-600 mg by mouth 3 (three) times daily as needed (leg/shoulder pain). May need 600 mg in the evening at night   Yes Historical Provider, MD  isosorbide mononitrate (IMDUR) 60 MG 24 hr tablet Take 60 mg by mouth at bedtime.  03/10/16  Yes Historical Provider, MD  nitroGLYCERIN (NITROSTAT) 0.4 MG SL tablet Place 0.4 mg under the tongue every 5 (five) minutes as needed for chest pain.   Yes Historical Provider, MD  oxyCODONE-acetaminophen (PERCOCET) 10-325 MG tablet Take 1 tablet by mouth 2 (two) times daily as needed for pain.    Yes Historical Provider, MD  rosuvastatin (CRESTOR) 20 MG tablet Take 20 mg by mouth at bedtime.    Yes Historical Provider, MD  sacubitril-valsartan (ENTRESTO) 97-103 MG Take 1 tablet by mouth 2 (two) times daily.   Yes Historical Provider, MD  spironolactone (ALDACTONE) 25 MG tablet Take 25 mg by mouth at bedtime. 04/02/16  Yes Historical Provider, MD   BP 97/65 mmHg  Pulse 58  Temp(Src) 97.6 F (36.4 C) (Oral)  Resp 17  Ht 5\' 10"  (1.778 m)  Wt 77.111 kg  BMI 24.39 kg/m2  SpO2 93% Physical Exam  Constitutional: He appears well-developed and well-nourished. No  distress.  HENT:  Head: Normocephalic and atraumatic.  Left Ear: External ear normal.  Eyes: Conjunctivae are normal. Pupils are equal, round, and reactive to light. Right eye exhibits no discharge. Left eye exhibits no discharge.  Neck: Normal range of motion. Neck supple.  Cardiovascular: Normal rate, regular rhythm and normal heart sounds.   No murmur heard. Pulmonary/Chest: Effort normal and breath sounds normal. No respiratory distress.  Abdominal: Soft. Bowel sounds are normal. He exhibits no distension and no mass. There is no tenderness. There is no rebound and no guarding.  Genitourinary: Rectum normal.  No rectal melena, gross blood  Musculoskeletal: He exhibits no  edema (of LEs).  Neurological: He is alert.  Skin: Skin is warm. He is not diaphoretic. There is pallor.  Psychiatric: He has a normal mood and affect.  Nursing note and vitals reviewed.   ED Course  Procedures (including critical care time) Labs Review Labs Reviewed  BASIC METABOLIC PANEL - Abnormal; Notable for the following:    Potassium 6.0 (*)    CO2 21 (*)    Glucose, Bld 117 (*)    BUN 45 (*)    Creatinine, Ser 2.57 (*)    GFR calc non Af Amer 23 (*)    GFR calc Af Amer 27 (*)    All other components within normal limits  CBC - Abnormal; Notable for the following:    RBC 3.58 (*)    Hemoglobin 11.2 (*)    HCT 35.4 (*)    All other components within normal limits  D-DIMER, QUANTITATIVE (NOT AT [facility]) - Abnormal; Notable for the following:    D-Dimer, Quant 1.03 (*)    All other components within normal limits  BRAIN NATRIURETIC PEPTIDE - Abnormal; Notable for the following:    B Natriuretic Peptide 131.7 (*)    All other components within normal limits  I-STAT CHEM 8, ED - Abnormal; Notable for the following:    Potassium 6.0 (*)    BUN 42 (*)    Creatinine, Ser 2.40 (*)    Glucose, Bld 102 (*)    Hemoglobin 11.2 (*)    HCT 33.0 (*)    All other components within normal limits  TROPONIN I   LACTIC ACID, PLASMA  TSH  URINALYSIS, ROUTINE W REFLEX MICROSCOPIC (NOT AT [facility])  LACTIC ACID, PLASMA    Imaging Review Dg Chest 2 View  04/11/2016  CLINICAL DATA:  Shortness of breath, status post gastric bypass surgery EXAM: CHEST  2 VIEW COMPARISON:  05/19/2013 FINDINGS: Cardiomediastinal silhouette is stable. There is mild elevation of the right hemidiaphragm. Dual lead cardiac pacemaker is unchanged in position. Linear atelectasis or scarring noted right midlung. Mild linear atelectasis or scarring left base. No infiltrate or pulmonary edema. Status post CABG. IMPRESSION: No active disease. Status post CABG. Dual lead cardiac pacemaker in place. Mild elevation of the right hemidiaphragm. Linear atelectasis right midlung and left base. Electronically Signed   By: Lahoma Crocker M.D.   On: 04/11/2016 16:01   I have personally reviewed and evaluated these images and lab results as part of my medical decision-making.   EKG Interpretation   Date/Time:  Friday April 11 2016 14:27:07 EDT Ventricular Rate:  60 PR Interval:  62 QRS Duration: 101 QT Interval:  422 QTC Calculation: 422 R Axis:   57 Text Interpretation:  Sinus rhythm Short PR interval Low voltage,  precordial leads Confirmed by Hazle Coca [phone]) on 04/11/2016 3:02:50 PM      MDM   Final diagnoses:  Elevated d-dimer  AKI (acute kidney injury) ([place])  Hyperkalemia  Hypotension, unspecified hypotension type   Patient hypotensive likely secondary to weakness, decreased by mouth intake and recent weight loss over the last month. Patient's had multiple evaluations which have been unremarkable and the recent last 2 weeks. Decrease antihypertensives over the last 2 days. New onset of hypotension. Concern for possible underlying malignancy versus infectious etiology. TSH unremarkable.  Labs reviewed. Patient has an acute kidney injury, likely secondary to hypotension. Potassium is slightly elevated at 6.0, gentle fluid hydration at 500  mL running at this time. Will recheck potassium after that. EKG without  QRS prolongation, peak T waves or other signs of severe hyperkalemia. We'll hold from calcium gluconate. Otherwise, chest x-ray without pneumonia. BNP slightly elevated and patient does have CHF from prior ejection fraction. D-dimer elevated.Marland Kitchen Pending VQ scan. Patient wife updated at the bedside. Admit.      Karma Greaser, MD 04/11/16 [place], MD 04/14/16 1452

## 2016-04-11 NOTE — ED Notes (Addendum)
Pt brought in EMS from home for generalized weakness and SOB that began at 0830 this morning.  Pt reports he has been seen by two doctors over the past two days for symptoms but also sts that bilateral leg weakness has been going on x3 months and "doctors just don't know what's causing it.".  Pt reports tingling in bilateral feet that's been keeping him up at night.

## 2016-04-11 NOTE — ED Notes (Signed)
Nuc med tech contacted this RN in regards to Pulm Perf and Vent test and if it needed to be done tonight, Dr. Ralene Bathe advised test could wait until tomorrow, [place] made aware.

## 2016-04-11 NOTE — ED Notes (Signed)
Dinner tray, heart healthy diet ordered

## 2016-04-11 NOTE — ED Notes (Signed)
Dinner tray arrived at this time.

## 2016-04-12 ENCOUNTER — Inpatient Hospital Stay (HOSPITAL_COMMUNITY): Payer: Medicare Other

## 2016-04-12 DIAGNOSIS — E875 Hyperkalemia: Secondary | ICD-10-CM

## 2016-04-12 DIAGNOSIS — I9589 Other hypotension: Secondary | ICD-10-CM

## 2016-04-12 DIAGNOSIS — N183 Chronic kidney disease, stage 3 (moderate): Secondary | ICD-10-CM

## 2016-04-12 DIAGNOSIS — I5042 Chronic combined systolic (congestive) and diastolic (congestive) heart failure: Secondary | ICD-10-CM

## 2016-04-12 DIAGNOSIS — N179 Acute kidney failure, unspecified: Principal | ICD-10-CM

## 2016-04-12 LAB — URINALYSIS, ROUTINE W REFLEX MICROSCOPIC
BILIRUBIN URINE: NEGATIVE
Glucose, UA: NEGATIVE mg/dL
HGB URINE DIPSTICK: NEGATIVE
Ketones, ur: NEGATIVE mg/dL
Leukocytes, UA: NEGATIVE
Nitrite: NEGATIVE
PH: 5 (ref 5.0–8.0)
Protein, ur: NEGATIVE mg/dL
SPECIFIC GRAVITY, URINE: 1.018 (ref 1.005–1.030)

## 2016-04-12 LAB — CBC WITH DIFFERENTIAL/PLATELET
Basophils Absolute: 0 10*3/uL (ref 0.0–0.1)
Basophils Relative: 0 %
EOS ABS: 0.4 10*3/uL (ref 0.0–0.7)
EOS PCT: 5 %
HCT: 36.1 % — ABNORMAL LOW (ref 39.0–52.0)
Hemoglobin: 11.3 g/dL — ABNORMAL LOW (ref 13.0–17.0)
LYMPHS ABS: 2.3 10*3/uL (ref 0.7–4.0)
LYMPHS PCT: 28 %
MCH: 30.9 pg (ref 26.0–34.0)
MCHC: 31.3 g/dL (ref 30.0–36.0)
MCV: 98.6 fL (ref 78.0–100.0)
MONO ABS: 0.8 10*3/uL (ref 0.1–1.0)
Monocytes Relative: 9 %
Neutro Abs: 4.7 10*3/uL (ref 1.7–7.7)
Neutrophils Relative %: 58 %
PLATELETS: 194 10*3/uL (ref 150–400)
RBC: 3.66 MIL/uL — ABNORMAL LOW (ref 4.22–5.81)
RDW: 12.6 % (ref 11.5–15.5)
WBC: 8.1 10*3/uL (ref 4.0–10.5)

## 2016-04-12 LAB — BASIC METABOLIC PANEL
Anion gap: 5 (ref 5–15)
BUN: 40 mg/dL — AB (ref 6–20)
CALCIUM: 9 mg/dL (ref 8.9–10.3)
CO2: 23 mmol/L (ref 22–32)
CREATININE: 2.34 mg/dL — AB (ref 0.61–1.24)
Chloride: 111 mmol/L (ref 101–111)
GFR calc Af Amer: 30 mL/min — ABNORMAL LOW (ref >60)
GFR, EST NON AFRICAN AMERICAN: 26 mL/min — AB (ref >60)
GLUCOSE: 110 mg/dL — AB (ref 65–99)
Potassium: 5.2 mmol/L — ABNORMAL HIGH (ref 3.5–5.1)
SODIUM: 139 mmol/L (ref 135–145)

## 2016-04-12 LAB — GLUCOSE, CAPILLARY
GLUCOSE-CAPILLARY: 98 mg/dL (ref 65–99)
Glucose-Capillary: 87 mg/dL (ref 65–99)

## 2016-04-12 MED ORDER — TECHNETIUM TC 99M DIETHYLENETRIAME-PENTAACETIC ACID: 30.0000 | Freq: Once | INTRAVENOUS | Status: DC | PRN

## 2016-04-12 MED ORDER — SODIUM POLYSTYRENE SULFONATE 15 GM/60ML PO SUSP
15.0000 g | Freq: Once | ORAL | Status: AC
  Administered 2016-04-12: 15 g via ORAL
  Filled 2016-04-12: qty 60

## 2016-04-12 MED ORDER — TECHNETIUM TO 99M ALBUMIN AGGREGATED
4.4400 | Freq: Once | INTRAVENOUS | Status: AC | PRN
  Administered 2016-04-12: 4 via INTRAVENOUS

## 2016-04-12 NOTE — Progress Notes (Addendum)
Patient ID: Connor Wilkerson, male   DOB: 06-Oct-1941, 75 y.o.   MRN: [id number]  PROGRESS NOTE    Connor Wilkerson  [id number] DOB: 05/26/1941 DOA: 04/11/2016  PCP: Ernestene Kiel, MD   Brief Narrative:  75 year old male with past medical history of CAD s/p CABG and ICD, carotid stenosis, CHF, CKD III, PVD who presented to [facility] with reports of dizziness and hypotension. Patient's wife checked the patient's blood pressure and it was 62/37. Patient also had recurrent falls for couple of weeks for which reason he was seeing a neurologist in cardiologist. There was no arrhythmia noted on ICD interrogation by cardiology.  Patient was hemodynamically stable on the admission. Blood work notable for hemoglobin 11.2, potassium 6 which improved with one dose of Kayexalate 15 gm down to 5.2, creatinine was 2.4.   Assessment & Plan:  Hypertension / dizziness - Blood pressure is stable this morning 131/65 but he's home medications were not resumed - Ventilation/perfusion scan is pending - Continue to monitor on telemetry - Troponin level was within normal limits - EEG is pending - Continue IV fluids for hydration and blood pressure support - PT evaluation   Chronic systolic and diastolic congestive heart failure - Last 2-D echo in 2014 with ejection fraction of 45% - BNP slightly elevated on this admission 131 - Stable respiratory status  Hyperkalemia - Probably from spironolactone which was placed on hold - Patient also given Kayexalate and repeat potassium 5.2 - Will give another dose of Kayexalate and follow-up BMP tomorrow morning  Acute renal failure superimposed on CKD stage 3 - Baseline cr 1.42 in 02/2015 and on this admission 2.57 - Unclear etiology of worsening renal function, perhaps due to ARB's - Cr 2.37 this am, improved with hydration - Will continue to monitor renal function   DVT prophylaxis: Lovenox subQ  Code Status: full code  Family Communication:  no family at the bedside this am  Disposition Plan: home in next 24-48 hours probably by 6/12   Consultants:   None   Procedures:   V/Q scan - pending   EEG- pending  Carotid doppler - pending   Antimicrobials:   None     Subjective: No overnight events  Objective: Filed Vitals:   04/11/16 1945 04/11/16 2027 04/12/16 0000 04/12/16 0415  BP: 95/67 115/68 120/70 131/65  Pulse: 60 61 60 59  Temp:  97.7 F (36.5 C) 97.5 F (36.4 C) 98 F (36.7 C)  TempSrc:  Oral Oral Oral  Resp: 18 18 18 20   Height:  5\' 10"  (1.778 m)    Weight:  78.563 kg (173 lb 3.2 oz)    SpO2: 97% 96% 97% 98%    Intake/Output Summary (Last 24 hours) at 04/12/16 0834 Last data filed at 04/12/16 0500  Gross per 24 hour  Intake    360 ml  Output    950 ml  Net   -590 ml   Filed Weights   04/11/16 1427 04/11/16 2027  Weight: 77.111 kg (170 lb) 78.563 kg (173 lb 3.2 oz)    Examination:  General exam: Appears calm and comfortable  Respiratory system: Clear to auscultation. Respiratory effort normal. Cardiovascular system: S1 & S2 heard, RRR. No JVD Gastrointestinal system: Abdomen is nondistended, soft and nontender. No organomegaly or masses felt. Normal bowel sounds heard. Central nervous system: Alert and oriented. No focal neurological deficits. Extremities: Symmetric 5 x 5 power. Skin: No rashes, lesions or ulcers Psychiatry: Judgement and insight appear normal.  Mood & affect appropriate.   Data Reviewed: I have personally reviewed following labs and imaging studies  CBC:  Recent Labs Lab 04/11/16 1451 04/11/16 1843 04/12/16 0425  WBC 8.5  --  8.1  NEUTROABS  --   --  4.7  HGB 11.2* 11.2* 11.3*  HCT 35.4* 33.0* 36.1*  MCV 98.9  --  98.6  PLT 192  --  [id number]   Basic Metabolic Panel:  Recent Labs Lab 04/11/16 1451 04/11/16 1843 04/12/16 0425  NA 136 139 139  K 6.0* 6.0* 5.2*  CL 110 109 111  CO2 21*  --  23  GLUCOSE 117* 102* 110*  BUN 45* 42* 40*  CREATININE 2.57*  2.40* 2.34*  CALCIUM 9.0  --  9.0   GFR: Estimated Creatinine Clearance: 28.6 mL/min (by C-G formula based on Cr of 2.34). Liver Function Tests: No results for input(s): AST, ALT, ALKPHOS, BILITOT, PROT, ALBUMIN in the last 168 hours. No results for input(s): LIPASE, AMYLASE in the last 168 hours. No results for input(s): AMMONIA in the last 168 hours. Coagulation Profile: No results for input(s): INR, PROTIME in the last 168 hours. Cardiac Enzymes:  Recent Labs Lab 04/11/16 1451  TROPONINI <0.03   BNP (last 3 results) No results for input(s): PROBNP in the last 8760 hours. HbA1C: No results for input(s): HGBA1C in the last 72 hours. CBG: No results for input(s): GLUCAP in the last 168 hours. Lipid Profile: No results for input(s): CHOL, HDL, LDLCALC, TRIG, CHOLHDL, LDLDIRECT in the last 72 hours. Thyroid Function Tests:  Recent Labs  04/11/16 1540  TSH 3.776   Anemia Panel:  Recent Labs  04/10/16 1540  VITAMINB12 664   Urine analysis:    Component Value Date/Time   COLORURINE YELLOW 04/12/2016 0033   APPEARANCEUR CLEAR 04/12/2016 0033   LABSPEC 1.018 04/12/2016 0033   PHURINE 5.0 04/12/2016 0033   GLUCOSEU NEGATIVE 04/12/2016 0033   HGBUR NEGATIVE 04/12/2016 0033   BILIRUBINUR NEGATIVE 04/12/2016 0033   KETONESUR NEGATIVE 04/12/2016 0033   PROTEINUR NEGATIVE 04/12/2016 0033   NITRITE NEGATIVE 04/12/2016 0033   LEUKOCYTESUR NEGATIVE 04/12/2016 0033   Sepsis Labs: @LABRCNTIP (procalcitonin:4,lacticidven:4)   )No results found for this or any previous visit (from the past 240 hour(s)).    Radiology Studies: Dg Chest 2 View 04/11/2016  No active disease. Status post CABG. Dual lead cardiac pacemaker in place. Mild elevation of the right hemidiaphragm. Linear atelectasis right midlung and left base.   Scheduled Meds: . aspirin EC  81 mg Oral [id number]  . buPROPion  300 mg Oral QHS  . clopidogrel  75 mg Oral QHS  . enoxaparin (LOVENOX) injection  30 mg  Subcutaneous Q24H  . escitalopram  10 mg Oral Daily  . famotidine  20 mg Oral BID  . rosuvastatin  20 mg Oral QHS  . sodium chloride flush  3 mL Intravenous Q12H   Continuous Infusions:    LOS: 1 day   Time spent: 25 minutes  Greater than 50% of the time spent on counseling and coordinating the care.   Leisa Lenz, MD [facility] Pager [phone]  If 7PM-7AM, please contact night-coverage [email or web address] Password [facility] 04/12/2016, 8:34 AM

## 2016-04-12 NOTE — Progress Notes (Signed)
  Echocardiogram 2D Echocardiogram has been performed.  Connor Wilkerson 04/12/2016, 12:40 PM

## 2016-04-13 ENCOUNTER — Inpatient Hospital Stay (HOSPITAL_COMMUNITY): Payer: Medicare Other

## 2016-04-13 DIAGNOSIS — R42 Dizziness and giddiness: Secondary | ICD-10-CM

## 2016-04-13 LAB — BASIC METABOLIC PANEL
ANION GAP: 7 (ref 5–15)
BUN: 28 mg/dL — ABNORMAL HIGH (ref 6–20)
CHLORIDE: 110 mmol/L (ref 101–111)
CO2: 22 mmol/L (ref 22–32)
Calcium: 9.3 mg/dL (ref 8.9–10.3)
Creatinine, Ser: 1.84 mg/dL — ABNORMAL HIGH (ref 0.61–1.24)
GFR calc non Af Amer: 34 mL/min — ABNORMAL LOW (ref >60)
GFR, EST AFRICAN AMERICAN: 40 mL/min — AB (ref >60)
Glucose, Bld: 128 mg/dL — ABNORMAL HIGH (ref 65–99)
POTASSIUM: 4.1 mmol/L (ref 3.5–5.1)
Sodium: 139 mmol/L (ref 135–145)

## 2016-04-13 NOTE — Discharge Instructions (Signed)

## 2016-04-13 NOTE — Discharge Summary (Signed)
Physician Discharge Summary  Connor Wilkerson [id number] DOB: 11-15-1940 DOA: 04/11/2016  PCP: Ernestene Kiel, MD  Admit date: 04/11/2016 Discharge date: 04/13/2016  Recommendations for Outpatient Follow-up:  CT head showed no acute findings.  Kidney function better prior to discharge. Creatinine is 1.8 and baseline is  1.4 so please follow up with PCP to recheck kidney function in next 1-2 weeks.  Discharge Diagnoses:  Active Problems:   AKI (acute kidney injury) ([place])   Hypotension    Discharge Condition: stable   Diet recommendation: as tolerated   History of present illness:  75 year old male with past medical history of CAD s/p CABG and ICD, carotid stenosis, CHF, CKD III, PVD who presented to [facility] with reports of dizziness and hypotension. Patient's wife checked the patient's blood pressure and it was 62/37. Patient also had recurrent falls for couple of weeks for which reason he was seeing a neurologist in cardiologist. There was no arrhythmia noted on ICD interrogation by cardiology.  Patient was hemodynamically stable on the admission. Blood work notable for hemoglobin 11.2, potassium 6 which improved with one dose of Kayexalate 15 gm down to 5.2, creatinine was 2.4.  Hospital Course:   Assessment & Plan:  Hypertension / dizziness - Blood pressure is stable this morning 131/65 but he's home medications were not resumed - Ventilation/perfusion scan is negative for PE - Troponin level was within normal limits - EEG never done but no suspicion for seizures  - CT head negative for acute findings - PT evaluation - no follow up required   Chronic systolic and diastolic congestive heart failure - Last 2-D echo in 2014 with ejection fraction of 45% - BNP slightly elevated on this admission 131 - Stable respiratory status  Hyperkalemia - Probably from spironolactone which was placed on hold - Patient also given Kayexalate and repeat potassium 5.2 -  Potassium WNL this am   Acute renal failure superimposed on CKD stage 3 - Baseline cr 1.42 in 02/2015 and on this admission 2.57 - Unclear etiology of worsening renal function, perhaps due to ARB's - Cr improved to 1.8 this am  Essential hypertension - Continue home meds   DVT prophylaxis: Lovenox subQ  Code Status: full code  Family Communication: wife at the bedside this am     Consultants:   None  Procedures:   V/Q scan - pending   EEG- pending  Carotid doppler - pending  Antimicrobials:   None   Signed:  Leisa Lenz, MD  [facility] 04/13/2016, 11:03 AM  Pager #: [phone]  Time spent in minutes: less than 30 minutes  Discharge Exam: Filed Vitals:   04/12/16 2056 04/13/16 0459  BP: 149/81 147/79  Pulse: 62 61  Temp: 97.4 F (36.3 C) 97.5 F (36.4 C)  Resp: 17 18   Filed Vitals:   04/12/16 0415 04/12/16 1404 04/12/16 2056 04/13/16 0459  BP: 131/65 143/66 149/81 147/79  Pulse: 59 80 62 61  Temp: 98 F (36.7 C) 97.9 F (36.6 C) 97.4 F (36.3 C) 97.5 F (36.4 C)  TempSrc: Oral Oral Oral Oral  Resp: 20 16 17 18   Height:      Weight:    76 kg (167 lb 8.8 oz)  SpO2: 98% 91% 98% 98%    General: Pt is alert, follows commands appropriately, not in acute distress Cardiovascular: Regular rate and rhythm, S1/S2 (+) Respiratory: Clear to auscultation bilaterally, no wheezing, no crackles, no rhonchi Abdominal: Soft, non tender, non distended, bowel sounds +,  no guarding Extremities: no edema, no cyanosis, pulses palpable bilaterally DP and PT Neuro: Grossly nonfocal  Discharge Instructions  Discharge Instructions    Call MD for:  difficulty breathing, headache or visual disturbances    Complete by:  As directed      Call MD for:  persistant dizziness or light-headedness    Complete by:  As directed      Call MD for:  persistant nausea and vomiting    Complete by:  As directed      Call MD for:  severe uncontrolled pain     Complete by:  As directed      Diet - low sodium heart healthy    Complete by:  As directed      Discharge instructions    Complete by:  As directed   CT head showed no acute findings.  Kidney function better prior to discharge. Creatinine is 1.8 and baseline is  1.4 so please follow up with PCP to recheck kidney function in next 1-2 weeks.     Increase activity slowly    Complete by:  As directed             Medication List    TAKE these medications        amLODipine 5 MG tablet  Commonly known as:  NORVASC  Take 5 mg by mouth at bedtime.     aspirin 81 MG tablet  Take 81 mg by mouth every morning.     buPROPion 300 MG 24 hr tablet  Commonly known as:  WELLBUTRIN XL  Take 300 mg by mouth at bedtime. Reported on 03/24/2016     carvedilol 25 MG tablet  Commonly known as:  COREG  TAKE 1 TABLET BY MOUTH TWICE A DAY WITH A MEAL     clopidogrel 75 MG tablet  Commonly known as:  PLAVIX  Take 75 mg by mouth at bedtime.     cyclobenzaprine 5 MG tablet  Commonly known as:  FLEXERIL  Take 5 mg by mouth 3 (three) times daily as needed for muscle spasms.     escitalopram 10 MG tablet  Commonly known as:  LEXAPRO  Take 10 mg by mouth daily.     famotidine 20 MG tablet  Commonly known as:  PEPCID  Take 20 mg by mouth 2 (two) times daily.     gabapentin 300 MG capsule  Commonly known as:  NEURONTIN  Take 300-600 mg by mouth 3 (three) times daily as needed (leg/shoulder pain). May need 600 mg in the evening at night     isosorbide mononitrate 60 MG 24 hr tablet  Commonly known as:  IMDUR  Take 60 mg by mouth at bedtime.     nitroGLYCERIN 0.4 MG SL tablet  Commonly known as:  NITROSTAT  Place 0.4 mg under the tongue every 5 (five) minutes as needed for chest pain.     oxyCODONE-acetaminophen 10-325 MG tablet  Commonly known as:  PERCOCET  Take 1 tablet by mouth 2 (two) times daily as needed for pain.     rosuvastatin 20 MG tablet  Commonly known as:  CRESTOR  Take 20  mg by mouth at bedtime.     sacubitril-valsartan 97-103 MG  Commonly known as:  ENTRESTO  Take 1 tablet by mouth 2 (two) times daily.     spironolactone 25 MG tablet  Commonly known as:  ALDACTONE  Take 25 mg by mouth at bedtime.  Follow-up Information    Follow up with PROCHNAU,CAROLINE, MD. Schedule an appointment as soon as possible for a visit in 1 week.   Specialty:  Internal Medicine   Why:  Follow up appt after recent hospitalization   Contact information:   [address] [place] [address] [phone]        The results of significant diagnostics from this hospitalization (including imaging, microbiology, ancillary and laboratory) are listed below for reference.    Significant Diagnostic Studies: Dg Chest 2 View  04/11/2016  CLINICAL DATA:  Shortness of breath, status post gastric bypass surgery EXAM: CHEST  2 VIEW COMPARISON:  05/19/2013 FINDINGS: Cardiomediastinal silhouette is stable. There is mild elevation of the right hemidiaphragm. Dual lead cardiac pacemaker is unchanged in position. Linear atelectasis or scarring noted right midlung. Mild linear atelectasis or scarring left base. No infiltrate or pulmonary edema. Status post CABG. IMPRESSION: No active disease. Status post CABG. Dual lead cardiac pacemaker in place. Mild elevation of the right hemidiaphragm. Linear atelectasis right midlung and left base. Electronically Signed   By: Lahoma Crocker M.D.   On: 04/11/2016 16:01   Ct Head Wo Contrast  04/13/2016  CLINICAL DATA:  Dizziness, sinus headache for 2 weeks EXAM: CT HEAD WITHOUT CONTRAST TECHNIQUE: Contiguous axial images were obtained from the base of the skull through the vertex without intravenous contrast. COMPARISON:  08/18/2014 FINDINGS: Brain: No intracranial hemorrhage, mass effect or midline shift. No acute cortical infarction. No mass lesion is noted on this unenhanced scan. Stable atrophy. Stable periventricular and patchy subcortical chronic  white matter disease. Vascular: Atherosclerotic calcifications of carotid siphon. Skull: Negative for fracture or focal lesion. Sinuses/Orbits: No acute findings. Other: None. IMPRESSION: 1. No acute intracranial abnormality. Stable atrophy and chronic white matter disease. No definite acute cortical infarction. Electronically Signed   By: Lahoma Crocker M.D.   On: 04/13/2016 10:17   Nm Pulmonary Perf And Vent  04/12/2016  CLINICAL DATA:  Elevated D-dimer.  Dyspnea. EXAM: NUCLEAR MEDICINE VENTILATION - PERFUSION LUNG SCAN TECHNIQUE: Ventilation images were obtained in multiple projections using inhaled aerosol Tc-31m DTPA. Perfusion images were obtained in multiple projections after intravenous injection of Tc-12m MAA. RADIOPHARMACEUTICALS:  30 mCi Technetium-71m DTPA aerosol inhalation and 4.4 mCi Technetium-41m MAA IV COMPARISON:  Chest radiograph of 1 day prior FINDINGS: Ventilation: Small ventilation defect involving the right mid lung which likely corresponds with atelectasis in the inferior right upper lobe on chest radiograph. Perfusion: Minimal decreased perfusion within the right mid lung, smaller than the ventilation defect. Not wedge-shaped. IMPRESSION: Very low probability for pulmonary embolism. Electronically Signed   By: Abigail Miyamoto M.D.   On: 04/12/2016 09:51    Microbiology: No results found for this or any previous visit (from the past 240 hour(s)).   Labs: Basic Metabolic Panel:  Recent Labs Lab 04/11/16 1451 04/11/16 1843 04/12/16 0425 04/13/16 0834  NA 136 139 139 139  K 6.0* 6.0* 5.2* 4.1  CL 110 109 111 110  CO2 21*  --  23 22  GLUCOSE 117* 102* 110* 128*  BUN 45* 42* 40* 28*  CREATININE 2.57* 2.40* 2.34* 1.84*  CALCIUM 9.0  --  9.0 9.3   Liver Function Tests: No results for input(s): AST, ALT, ALKPHOS, BILITOT, PROT, ALBUMIN in the last 168 hours. No results for input(s): LIPASE, AMYLASE in the last 168 hours. No results for input(s): AMMONIA in the last 168  hours. CBC:  Recent Labs Lab 04/11/16 1451 04/11/16 1843 04/12/16 0425  WBC 8.5  --  8.1  NEUTROABS  --   --  4.7  HGB 11.2* 11.2* 11.3*  HCT 35.4* 33.0* 36.1*  MCV 98.9  --  98.6  PLT 192  --  194   Cardiac Enzymes:  Recent Labs Lab 04/11/16 1451  TROPONINI <0.03   BNP: BNP (last 3 results)  Recent Labs  04/11/16 1542  BNP 131.7*    ProBNP (last 3 results) No results for input(s): PROBNP in the last 8760 hours.  CBG:  Recent Labs Lab 04/12/16 0709 04/12/16 1107  GLUCAP 98 87

## 2016-04-13 NOTE — Progress Notes (Signed)
VASCULAR LAB PRELIMINARY  PRELIMINARY  PRELIMINARY  PRELIMINARY  Carotid duplex completed.    Preliminary report:  60-79% Right ICA stenosis.  40-59% left ICA stenosis.  Systolic velocities are slightly higher than previous study done at [facility] on 03/24/16. Vertebral artery flow is antegrade.    Herald Vallin, RVT 04/13/2016, 11:50 AM

## 2016-04-13 NOTE — Progress Notes (Signed)
Utilization review completed.  

## 2016-04-13 NOTE — Progress Notes (Signed)
Discharged to home with family office visits in place teaching done  

## 2016-04-13 NOTE — Evaluation (Signed)
Physical Therapy Evaluation and D/C Patient Details Name: Connor Wilkerson MRN: [id number] DOB: 05-Jul-1941 Today's Date: 04/13/2016   History of Present Illness  75 year old male with past medical history of CAD s/p CABG and ICD, carotid stenosis, CHF, CKD III, PVD who presented to [facility] with reports of dizziness and hypotension. Patient's wife checked the patient's blood pressure and it was 62/37. Patient also had recurrent falls for couple of weeks for which reason he was seeing a neurologist in cardiologist. There was no arrhythmia noted on ICD interrogation by cardiology.  Clinical Impression  Pt admitted with above diagnosis. Pt currently without significant functional limitations and will be able to d/c home with assist of wife.  Pt and wife feel he is better than on admission and is at baseline.  Pt was able to ambulate without device with fair balance.  Discussed that pt would benefit from using cane initially upon d/c and pt and wife agree.  Will not need PT f/u since pt is at baseline.  Will not follow.  Thanks.     Follow Up Recommendations No PT follow up    Equipment Recommendations  None recommended by PT    Recommendations for Other Services       Precautions / Restrictions Precautions Precautions: Fall Restrictions Weight Bearing Restrictions: No      Mobility  Bed Mobility Overal bed mobility: Independent                Transfers Overall transfer level: Independent                  Ambulation/Gait Ambulation/Gait assistance: Supervision Ambulation Distance (Feet): 250 Feet Assistive device: None Gait Pattern/deviations: Step-through pattern;Decreased step length - right;Decreased dorsiflexion - right;Antalgic   Gait velocity interpretation: Below normal speed for age/gender General Gait Details: Pt with fair overall safety however did have one LOB in which pt self corrected.  Pt appears to be at baseline and wife and pt agree that  pt is better than he was when he came to the hospital.  Pt has a cane and discussed that pt may be steadier initially if he will just use the cane.    Stairs Stairs: Yes Stairs assistance: Min guard Stair Management: No rails;Alternating pattern;Forwards Number of Stairs: 3 General stair comments: able to go up and down the stairs with min guard assist for safety. Wife can assist pt at home.   Wheelchair Mobility    Modified Rankin (Stroke Patients Only)       Balance Overall balance assessment: Needs assistance;History of Falls Sitting-balance support: No upper extremity supported;Feet supported Sitting balance-Leahy Scale: Fair     Standing balance support: No upper extremity supported;During functional activity Standing balance-Leahy Scale: Fair Standing balance comment: Pt was able to stand without UE support for up to a minute.               High level balance activites: Turns;Sudden stops;Head turns;Direction changes High Level Balance Comments: able to complete with min guard assist to supervision.  Standardized Balance Assessment Standardized Balance Assessment : Dynamic Gait Index   Dynamic Gait Index Level Surface: Mild Impairment Change in Gait Speed: Mild Impairment Gait with Horizontal Head Turns: Normal Gait with Vertical Head Turns: Normal Gait and Pivot Turn: Mild Impairment Step Over Obstacle: Mild Impairment Step Around Obstacles: Normal Steps: Mild Impairment Total Score: 19       Pertinent Vitals/Pain Pain Assessment: No/denies pain  VSS    Home Living  Family/patient expects to be discharged to:: Private residence Living Arrangements: Spouse/significant other Available Help at Discharge: Family;Available 24 hours/day Type of Home: House Home Access: Stairs to enter Entrance Stairs-Rails: None Entrance Stairs-Number of Steps: 1 Home Layout: One level Home Equipment: Bedside commode;Cane - single point;Shower seat      Prior Function  Level of Independence: Independent               Hand Dominance   Dominant Hand: Right    Extremity/Trunk Assessment   Upper Extremity Assessment: Defer to OT evaluation           Lower Extremity Assessment: Generalized weakness;RLE deficits/detail RLE Deficits / Details: Appears to have decr dorsiflexion however pt states he hit his knee and that it is sore and causing him some pain.     Cervical / Trunk Assessment: Normal  Communication   Communication: No difficulties  Cognition Arousal/Alertness: Awake/alert Behavior During Therapy: WFL for tasks assessed/performed Overall Cognitive Status: Within Functional Limits for tasks assessed                      General Comments General comments (skin integrity, edema, etc.): Scored 19/24 on DGI suggesting low risk of falls.  discussed use of cane to improve balance and pt and wife agree.     Exercises        Assessment/Plan    PT Assessment Patent does not need any further PT services  PT Diagnosis Generalized weakness   PT Problem List    PT Treatment Interventions     PT Goals (Current goals can be found in the Care Plan section) Acute Rehab PT Goals Patient Stated Goal: to go home PT Goal Formulation: All assessment and education complete, DC therapy    Frequency     Barriers to discharge        Co-evaluation               End of Session Equipment Utilized During Treatment: Gait belt Activity Tolerance: Patient limited by fatigue Patient left: in bed;with call bell/phone within reach;with family/visitor present Nurse Communication: Mobility status         Time: [id number] PT Time Calculation (min) (ACUTE ONLY): 14 min   Charges:   PT Evaluation $PT Eval Low Complexity: 1 Procedure     PT G CodesIrwin Brakeman F 04-21-16, 10:11 AM Amanda Cockayne Acute Rehabilitation [phone] (pager)

## 2016-04-14 ENCOUNTER — Ambulatory Visit (INDEPENDENT_AMBULATORY_CARE_PROVIDER_SITE_OTHER): Payer: Medicare Other | Admitting: Surgery

## 2016-04-14 ENCOUNTER — Encounter: Payer: Self-pay | Admitting: Surgery

## 2016-04-14 VITALS — BP 124/82 | HR 60 | Temp 98.0°F | Resp 16 | Ht 70.0 in | Wt 168.0 lb

## 2016-04-14 DIAGNOSIS — I255 Ischemic cardiomyopathy: Secondary | ICD-10-CM

## 2016-04-14 DIAGNOSIS — R55 Syncope and collapse: Secondary | ICD-10-CM

## 2016-04-14 LAB — COPPER, SERUM: COPPER: 106 ug/dL (ref 72–166)

## 2016-04-14 LAB — RPR: RPR Ser Ql: NONREACTIVE

## 2016-04-14 LAB — VITAMIN B12: VITAMIN B 12: 664 pg/mL (ref 211–946)

## 2016-04-14 LAB — METHYLMALONIC ACID, SERUM: METHYLMALONIC ACID: 430 nmol/L — AB (ref 0–378)

## 2016-04-14 NOTE — Progress Notes (Signed)
[facility] [place]  Patient name: Connor Wilkerson MRN: [id number] DOB: 1940/12/19 Sex: male  REASON FOR VISIT: Follow-up  HPI: Connor Wilkerson is a 74 y.o. male who I initially saw for syncope and carotid stenosis. He had multiple syncopal spells and fell on multiple occasions. He has also had issues with gait instability. He takes a statin for hypercholesterolemia as well as dual antiplatelet therapy. He has an ICD in place. He was recently seen in our office for carotid duplex. He had a mild increase in the right carotid stenosis which was in the 60-79% category.   Recently, his wife describes an episode of dysarthria and garbled speech which lasted approximately an hour. It did resolve with nitroglycerin and oxycodone. He felt that his pacemaker had fired. He was seen by Dr. Caryl Comes who felt everything was okay. He denies any symptoms in his arms or legs other than some chronic tingling in his arms. He denies amaurosis fugax.  I scheduled the patient for a MRI and a CT angiogram to help with the decision-making for whether or not to proceed with carotid endarterectomy.  Unfortunately the patient was admitted to the hospital over the weekend with a blood pressure of 62/37 and dizziness.  He was found to be in acute renal failure as well likely secondary to dehydration.  His blood pressure improved with hydration and holding her blood pressure medicines and diuretics.  He did get a noncontrasted CT scan which was negative for acute issues.  He did not get any other studies likely because of his creatinine.  Past Medical History  Diagnosis Date  . Dyspnea   . Ischemic cardiomyopathy   . ICD (implantable cardiac defibrillator)  BSx   . Atrial fibrillation ([place])   . Myocardial infarction ([place])   . Peripheral vascular disease ([place])   . Near syncope 09/21/2014  . Abnormality of gait 02/07/2015    Family History  Problem  Relation Age of Onset  . Heart disease Mother   . Heart attack Mother   . Heart disease Father   . Heart attack Father   . Heart disease Brother   . Heart attack Brother     SOCIAL HISTORY: Social History  Substance Use Topics  . Smoking status: Former Smoker    Quit date: 09/19/1999  . Smokeless tobacco: Never Used     Comment: quit in 1998  . Alcohol Use: No     Comment: recovering alcoholic, quit [id number] yrs ago    Allergies  Allergen Reactions  . Amlodipine     Pt not sure if this is an allergy  . Tramadol Other (See Comments)    Unknown     Current Outpatient Prescriptions  Medication Sig Dispense Refill  . amLODipine (NORVASC) 5 MG tablet Take 5 mg by mouth at bedtime.     Marland Kitchen aspirin 81 MG tablet Take 81 mg by mouth every morning.     Marland Kitchen buPROPion (WELLBUTRIN XL) 300 MG 24 hr tablet Take 300 mg by mouth at bedtime. Reported on 03/24/2016    . carvedilol (COREG) 25 MG tablet TAKE 1 TABLET BY MOUTH TWICE A DAY WITH A MEAL 180 tablet 0  . clopidogrel (PLAVIX) 75 MG tablet Take 75 mg by mouth at bedtime.   3  . cyclobenzaprine (FLEXERIL) 5 MG tablet Take 5 mg by mouth 3 (three) times daily as needed for muscle spasms.   6  . escitalopram (LEXAPRO) 10 MG tablet Take 10 mg  by mouth daily.     . famotidine (PEPCID) 20 MG tablet Take 20 mg by mouth 2 (two) times daily.     Marland Kitchen gabapentin (NEURONTIN) 300 MG capsule Take 300-600 mg by mouth 3 (three) times daily as needed (leg/shoulder pain). May need 600 mg in the evening at night    . isosorbide mononitrate (IMDUR) 60 MG 24 hr tablet Take 60 mg by mouth at bedtime.     . nitroGLYCERIN (NITROSTAT) 0.4 MG SL tablet Place 0.4 mg under the tongue every 5 (five) minutes as needed for chest pain.    Marland Kitchen oxyCODONE-acetaminophen (PERCOCET) 10-325 MG tablet Take 1 tablet by mouth 2 (two) times daily as needed for pain.     . rosuvastatin (CRESTOR) 20 MG tablet Take 20 mg by mouth at bedtime.     . sacubitril-valsartan (ENTRESTO) 97-103 MG Take 1  tablet by mouth 2 (two) times daily.    Marland Kitchen spironolactone (ALDACTONE) 25 MG tablet Take 25 mg by mouth at bedtime.  3   No current facility-administered medications for this visit.    REVIEW OF SYSTEMS:  See history of present illness for pertinent positives and negatives.  Otherwise, all systems negative    PHYSICAL EXAM: Filed Vitals:   04/14/16 1533 04/14/16 1536  BP: 114/80 124/82  Pulse: 62 60  Temp: 98 F (36.7 C)   TempSrc: Oral   Resp: 16   Height: 5\' 10"  (1.778 m)   Weight: 168 lb (76.204 kg)   SpO2: 95%     GENERAL: The patient is a well-nourished male, in no acute distress. The vital signs are documented above. CARDIAC: There is a regular rate and rhythm.  PULMONARY: There is good air exchange bilaterally without wheezing or rales. ABDOMEN: Soft and non-tender with normal pitched bowel sounds.  MUSCULOSKELETAL: There are no major deformities or cyanosis. NEUROLOGIC: No focal weakness or paresthesias are detected. SKIN: There are no ulcers or rashes noted. PSYCHIATRIC: The patient has a normal affect.  DATA:  I have reviewed his CT of the head which was negative for acute issues  MEDICAL ISSUES: My gut feeling is that the patient's syncopal episodes are related more to blood pressure and dehydration rather than cerebral hypoperfusion.  I have encouraged him to stay hydrated and monitor his blood pressure to make sure that it does not get too low.  I will try to get a CT angiogram to evaluate for stenosis or plaque that would warrant endarterectomy on the right.  Because of his acute renal failure I will give him time to recover with plans for CT Joetta Manners of the neck and approximately 1-2 weeks.  If his stenosis appears significant we would consider carotid endarterectomy.    Annamarie Major, MD [facility] [phone] Pager [phone]

## 2016-04-15 ENCOUNTER — Other Ambulatory Visit: Payer: Self-pay | Admitting: *Deleted

## 2016-04-15 ENCOUNTER — Other Ambulatory Visit: Payer: Medicare Other

## 2016-04-15 DIAGNOSIS — G9001 Carotid sinus syncope: Secondary | ICD-10-CM

## 2016-04-16 ENCOUNTER — Telehealth: Payer: Self-pay | Admitting: Internal Medicine

## 2016-04-16 NOTE — Telephone Encounter (Signed)
New Message   Pt wife calling wants to schedule an appt with Dr. Caryl Comes for pt, but Dr. Caryl Comes schedule is far out. Pt wife wants pt to be seen by another Card Doctor that Dr. Caryl Comes recommends. Please call back to discuss

## 2016-04-17 NOTE — Telephone Encounter (Signed)
Spoke with wife Will arrange followup with RU 2 months SK 6 month

## 2016-04-17 NOTE — Telephone Encounter (Signed)
Melissa,  Can you please arrange for this patient to see Joseph Art in 2 months.  I have put the recall in for Dr. Caryl Comes for 6 months.  Thanks!

## 2016-04-21 ENCOUNTER — Encounter: Payer: Self-pay | Admitting: Surgery

## 2016-04-21 DIAGNOSIS — R5383 Other fatigue: Secondary | ICD-10-CM | POA: Diagnosis not present

## 2016-04-21 DIAGNOSIS — N189 Chronic kidney disease, unspecified: Secondary | ICD-10-CM | POA: Diagnosis not present

## 2016-04-21 DIAGNOSIS — I5040 Unspecified combined systolic (congestive) and diastolic (congestive) heart failure: Secondary | ICD-10-CM | POA: Diagnosis not present

## 2016-04-21 DIAGNOSIS — I739 Peripheral vascular disease, unspecified: Secondary | ICD-10-CM | POA: Diagnosis not present

## 2016-04-21 DIAGNOSIS — I10 Essential (primary) hypertension: Secondary | ICD-10-CM | POA: Diagnosis not present

## 2016-04-21 DIAGNOSIS — I1310 Hypertensive heart and chronic kidney disease without heart failure, with stage 1 through stage 4 chronic kidney disease, or unspecified chronic kidney disease: Secondary | ICD-10-CM | POA: Diagnosis not present

## 2016-04-21 DIAGNOSIS — I2510 Atherosclerotic heart disease of native coronary artery without angina pectoris: Secondary | ICD-10-CM | POA: Diagnosis not present

## 2016-04-21 DIAGNOSIS — I25118 Atherosclerotic heart disease of native coronary artery with other forms of angina pectoris: Secondary | ICD-10-CM | POA: Diagnosis not present

## 2016-04-21 DIAGNOSIS — I255 Ischemic cardiomyopathy: Secondary | ICD-10-CM | POA: Diagnosis not present

## 2016-04-21 LAB — ECHOCARDIOGRAM COMPLETE
CHL CUP TV REG PEAK VELOCITY: 301 cm/s
E/e' ratio: 6.69
EWDT: 356 ms
FS: 24 % — AB (ref 28–44)
HEIGHTINCHES: 70 in
IVS/LV PW RATIO, ED: 1.07
LA vol A4C: 57.9 ml
LADIAMINDEX: 2.12 cm/m2
LASIZE: 42 mm
LAVOL: 64.1 mL
LAVOLIN: 32.4 mL/m2
LEFT ATRIUM END SYS DIAM: 42 mm
LV PW d: 8.49 mm — AB (ref 0.6–1.1)
LV TDI E'MEDIAL: 6.53
LV e' LATERAL: 10.8 cm/s
LVEEAVG: 6.69
LVEEMED: 6.69
LVOT area: 4.15 cm2
LVOTD: 23 mm
Lateral S' vel: 10.8 cm/s
MV Dec: 356
MV Peak grad: 2 mmHg
MV pk E vel: 72.3 m/s
MVPKAVEL: 80.5 m/s
RV TAPSE: 12.7 mm
RV sys press: 39 mmHg
TDI e' lateral: 10.8
TRMAXVEL: 301 cm/s
WEIGHTICAEL: 2771.2 [oz_av]

## 2016-04-25 ENCOUNTER — Ambulatory Visit: Payer: Medicare Other | Attending: Neurology

## 2016-04-25 VITALS — BP 165/87 | HR 65

## 2016-04-25 DIAGNOSIS — R2689 Other abnormalities of gait and mobility: Secondary | ICD-10-CM | POA: Insufficient documentation

## 2016-04-25 DIAGNOSIS — R296 Repeated falls: Secondary | ICD-10-CM | POA: Diagnosis not present

## 2016-04-25 NOTE — Therapy (Signed)
[name] [address] [place], [place], [address] Phone: [phone]   Fax:  [phone]  Physical Therapy Evaluation  Patient Details  Name: Connor Wilkerson MRN: [id number] Date of Birth: 1941/04/20 Referring Provider: Dr. Jannifer Franklin  Encounter Date: 04/25/2016      PT End of Session - 04/25/16 1503    Visit Number 1   Number of Visits 9   Date for PT Re-Evaluation 05/25/16   Authorization Type G-CODE and progress note every 10th visit.   PT Start Time 1401   PT Stop Time 1453   PT Time Calculation (min) 52 min   Equipment Utilized During Treatment Gait belt   Activity Tolerance Patient tolerated treatment well   Behavior During Therapy WFL for tasks assessed/performed      Past Medical History  Diagnosis Date  . Dyspnea   . Ischemic cardiomyopathy   . ICD (implantable cardiac defibrillator)  BSx   . Atrial fibrillation ([place])   . Myocardial infarction ([place])   . Peripheral vascular disease ([place])   . Near syncope 09/21/2014  . Abnormality of gait 02/07/2015    Past Surgical History  Procedure Laterality Date  . Cardiac defibrillator placement    . Coronary artery bypass graft    . Hernia repair      x2  . Rotator cuff repair Right 2006  . Elbow surgery Left     plastic bone replacement  . Sinus exploration  1987  . Permanent pacemaker generator change N/A 11/25/2013    Procedure: PERMANENT PACEMAKER GENERATOR CHANGE;  Surgeon: Deboraha Sprang, MD;  Location: [facility] CATH LAB;  Service: Cardiovascular;  Laterality: N/A;  . Cardiac catheterization N/A 12/11/2015    Procedure: Left Heart Cath and Coronary Angiography;  Surgeon: Charolette Forward, MD;  Location: [facility] CV LAB;  Service: Cardiovascular;  Laterality: N/A;  . Cardiac defibrillator placement  04/08/2016    NOT MRI SAFE (DOCUMENT SCANNED IN SYSTEM    Filed Vitals:   04/25/16 1411 04/25/16 1457  BP: 169/91 165/87  Pulse: 60 65         Subjective  Assessment - 04/25/16 1411    Subjective Pt reports balance issues and falls began about 4-5 months ago , and  became progressively worse. Pt reported it was worse in the morning and when getting up out of chair, he would feel shaky and fall. Pt was admitted to the hospital around 04/11/16 for hypotension/falls and stayed for 3 days. Pt describes a lightheaded/wooziness that begins prior to fall, it resides in 2-3 minutes if pt sits down. Pt's wife, [name], present and assisted in provided hx. Pt reported it is getting better, and the MD stated it could be that a blockage released?, allowing blood flow to improve. Pt reported falls used to occur 2-3 times/day.  Pt has not had a fall in the last several weeks, after MD modified BP medication.  However, pt reports he does not feel likes he's back to baseline.   PT GOES BY [name].   Patient is accompained by: Family member  wife-[name]   Pertinent History CAD s/p MI and CABG and ICD (defibrillator and pacemaker per pt), carotid stenosis, CHF, CKD III, a-fib, Raynaud's syndrome, PVD   Patient Stated Goals Not to fall again   Currently in Pain? Yes   Pain Score 7    Pain Location Head   Pain Orientation --  behind B eyes   Pain Descriptors / Indicators Headache   Pain Type  Acute pain   Pain Onset Yesterday   Pain Frequency Intermittent   Aggravating Factors  nothing   Pain Relieving Factors relax and lie down             [facility] PT Assessment - 04/25/16 1419    Assessment   Medical Diagnosis Abnormality of gait, near syncope   Referring Provider Dr. Jannifer Franklin   Onset Date/Surgical Date 12/27/15   Prior Therapy none   Precautions   Precautions Fall   Restrictions   Weight Bearing Restrictions No   Balance Screen   Has the patient fallen in the past 6 months Yes   How many times? --  2 a day every other day for about 3 months   Has the patient had a decrease in activity level because of a fear of falling?  Yes   Is the patient reluctant to leave  their home because of a fear of falling?  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   Coordination   Gross Motor Movements are Fluid and Coordinated Yes   Fine Motor Movements are Fluid and Coordinated Yes   Posture/Postural Control   Posture/Postural Control Postural limitations   Postural Limitations Rounded Shoulders;Forward head  L shoulder elevated   ROM / Strength   AROM / PROM / Strength AROM;Strength   AROM   Overall AROM  Within functional limits for tasks performed   Overall AROM Comments B UE/LE WFL   Strength   Overall Strength Within functional limits for tasks performed   Overall Strength Comments B UE/LE WFL (grossly 4+/5)   Transfers   Transfers Sit to Stand;Stand to Sit   Sit to Stand 7: Independent   Stand to Sit 7: Independent   Ambulation/Gait   Ambulation/Gait Yes   Ambulation/Gait Assistance 5: Supervision   Ambulation/Gait Assistance Details One LOB but pt self corrected with stepping strategy. LOB due decr. R toe clearance.    Ambulation Distance (Feet) 100 Feet   Assistive device None   Gait Pattern  Step-through pattern;Decreased trunk rotation   Ambulation Surface Level;Indoor   Gait velocity 4.23ft/sec.   Functional Gait  Assessment   Gait assessed  Yes   Gait Level Surface Walks 20 ft in less than 7 sec but greater than 5.5 sec, uses assistive device, slower speed, mild gait deviations, or deviates 6-10 in outside of the 12 in walkway width.  5.7sec.   Change in Gait Speed Able to smoothly change walking speed without loss of balance or gait deviation. Deviate no more than 6 in  outside of the 12 in walkway width.   Gait with Horizontal Head Turns Performs head turns smoothly with slight change in gait velocity (eg, minor disruption to smooth gait path), deviates 6-10 in outside 12 in walkway width, or uses an assistive device.   Gait with Vertical Head Turns Performs task with slight change in gait velocity (eg, minor disruption to smooth gait path), deviates 6 - 10 in outside 12 in walkway width or uses assistive device   Gait and Pivot Turn Pivot turns safely within 3 sec and stops quickly with no loss of balance.   Step Over Obstacle Is able to step over 2 stacked shoe boxes taped together (9 in total height) without changing gait speed. No evidence of imbalance.   Gait with Narrow Base of Support Ambulates 4-7 steps.   Gait with Eyes Closed Walks 20 ft, uses assistive device, slower speed, mild gait deviations, deviates 6-10 in outside 12 in walkway width. Ambulates 20 ft in less than 9 sec but greater than 7 sec.   Ambulating Backwards Walks 20 ft, uses assistive device, slower speed, mild gait deviations, deviates 6-10 in outside 12 in walkway width.   Steps Alternating feet, no rail.   Total Score 23                           PT Education - 04/25/16 1500    Education provided Yes   Education Details PT discussed frequency/duration and outcome measures.   Person(s) Educated Patient;Spouse   Methods Explanation   Comprehension Verbalized understanding           PT Short Term Goals - 04/25/16 1509    PT SHORT TERM GOAL #1   Title same as LTGs           PT Long Term Goals - 04/25/16 1509    PT LONG TERM GOAL #1   Title Pt will be IND in HEP in order to improve balance and reduce falls. Target date: 05/23/16   Status New   PT LONG TERM GOAL #2   Title Pt will improve FGA score to >/=30/30 in order to reduce falls risk. Target date: 05/23/16   Status New   PT LONG TERM GOAL #3   Title Pt will amb. 1000' over even/uneven terrain at IND level to improve functional mobility. Target date: 05/23/16   Status New   PT LONG TERM GOAL #4   Title Pt will report zero falls within last 2 weeks to improve safety. Target date: 05/23/16   Status New   PT LONG TERM GOAL #5   Title Pt will verablize strategies to reduce falls risk at home. Target date: 05/23/16   Status New   Additional Long Term Goals   Additional Long Term Goals Yes   PT LONG TERM GOAL #6   Title Assess pt climbing 20 steps and write goal if appropriate (baseball game steps). Target date: 05/23/16   Status New               Plan - 04/25/16 1504    Clinical Impression Statement Pt is a pleasant 75y/o male presenting to [facility] neuro after hospitalization 04/11/16-04/13/16 for hypotension and falls. Pt examination revealed the following deficits: gait deviations, impaired balance, decr. endurance, elevated BP, and FGA score indicates pt is at risk for falls. Pt's gait speed is WNL, however, FGA places pt at moderate risk for falls. Pt denied dizziness, and reported lighheadedness prior to  falls which is consistent with MD report of near syncope.  Pt's wife reports pt is to take Coreg if systolic BP is 0000000.    Rehab Potential Good   Clinical Impairments Affecting Rehab Potential co-morbidities   PT Frequency 2x / week   PT Duration 4 weeks   PT Treatment/Interventions ADLs/Self Care Home Management;Biofeedback;Canalith Repostioning;Orthotic Fit/Training;Patient/family  education;Cognitive remediation;Neuromuscular re-education;Balance training;Therapeutic exercise;Therapeutic activities;Functional mobility training;Stair training;Gait training;DME Instruction;Vestibular   PT Next Visit Plan Assess for orthostatic hypotension. Provide pt with balance HEP, gait training over uneven terrain.   Consulted and Agree with Plan of Care Patient      Patient will benefit from skilled therapeutic intervention in order to improve the following deficits and impairments:  Abnormal gait, Decreased balance, Decreased endurance, Pain, Decreased mobility, Decreased knowledge of use of DME (PT will not directly address pain but will monitor closely)  Visit Diagnosis: Repeated falls - Plan: PT plan of care cert/re-cert  Other abnormalities of gait and mobility - Plan: PT plan of care cert/re-cert      G-Codes - 0000000 1513    Functional Assessment Tool Used FGA: 23/30   Functional Limitation Mobility: Walking and moving around   Mobility: Walking and Moving Around Current Status [phone]) At least 20 percent but less than 40 percent impaired, limited or restricted   Mobility: Walking and Moving Around Goal Status [phone]) At least 1 percent but less than 20 percent impaired, limited or restricted       Problem List Patient Active Problem List   Diagnosis Date Noted  . AKI (acute kidney injury) ([place]) 04/11/2016  . Hypotension 04/11/2016  . Unstable angina ([place]) 12/10/2015  . Abnormality of gait 02/07/2015  . Near syncope 09/21/2014  . Repeated falls 12/15/2013  . OSA (obstructive sleep apnea) 12/15/2013  . HYPERTENSION, HEART CONTROLLED W/ CHF 01/01/2010  . CARDIOMYOPATHY, ISCHEMIC 12/31/2009  . DYSPNEA 12/31/2009  . ICD -[employer] 12/31/2009    Rosilyn Coachman L 04/25/2016, 3:17 PM  [facility] [address] [place], [place], [address] Phone: [phone]   Fax:  [phone]  Name:  Connor Wilkerson MRN: [id number] Date of Birth: 07/18/1941   Geoffry Paradise, PT,DPT 04/25/2016 3:18 PM Phone: [phone] Fax: [phone]

## 2016-04-28 ENCOUNTER — Inpatient Hospital Stay: Admission: RE | Admit: 2016-04-28 | Payer: Medicare Other | Source: Ambulatory Visit

## 2016-04-28 ENCOUNTER — Ambulatory Visit: Payer: Medicare Other | Admitting: Surgery

## 2016-05-08 ENCOUNTER — Ambulatory Visit: Payer: Medicare Other | Attending: Neurology | Admitting: Physical Therapy

## 2016-05-08 VITALS — HR 60

## 2016-05-08 DIAGNOSIS — R296 Repeated falls: Secondary | ICD-10-CM | POA: Diagnosis not present

## 2016-05-08 DIAGNOSIS — R2689 Other abnormalities of gait and mobility: Secondary | ICD-10-CM | POA: Insufficient documentation

## 2016-05-08 NOTE — Patient Instructions (Signed)
Fall Prevention in the Home  Falls can cause injuries and can affect people from all age groups. There are many simple things that you can do to make your home safe and to help prevent falls. WHAT CAN I DO ON THE OUTSIDE OF MY HOME?  Regularly repair the edges of walkways and driveways and fix any cracks.  Remove high doorway thresholds.  Trim any shrubbery on the main path into your home.  Use bright outdoor lighting.  Clear walkways of debris and clutter, including tools and rocks.  Regularly check that handrails are securely fastened and in good repair. Both sides of any steps should have handrails.  Install guardrails along the edges of any raised decks or porches.  Have leaves, snow, and ice cleared regularly.  Use sand or salt on walkways during winter months.  In the garage, clean up any spills right away, including grease or oil spills. WHAT CAN I DO IN THE BATHROOM?  Use night lights.  Install grab bars by the toilet and in the tub and shower. Do not use towel bars as grab bars.  Use non-skid mats or decals on the floor of the tub or shower.  If you need to sit down while you are in the shower, use a plastic, non-slip stool..  Keep the floor dry. Immediately clean up any water that spills on the floor.  Remove soap buildup in the tub or shower on a regular basis.  Attach bath mats securely with double-sided non-slip rug tape.  Remove throw rugs and other tripping hazards from the floor. WHAT CAN I DO IN THE BEDROOM?  Use night lights.  Make sure that a bedside light is easy to reach.  Do not use oversized bedding that drapes onto the floor.  Have a firm chair that has side arms to use for getting dressed.  Remove throw rugs and other tripping hazards from the floor. WHAT CAN I DO IN THE KITCHEN?   Clean up any spills right away.  Avoid walking on wet floors.  Place frequently used items in easy-to-reach places.  If you need to reach for something  above you, use a sturdy step stool that has a grab bar.  Keep electrical cables out of the way.  Do not use floor polish or wax that makes floors slippery. If you have to use wax, make sure that it is non-skid floor wax.  Remove throw rugs and other tripping hazards from the floor. WHAT CAN I DO IN THE STAIRWAYS?  Do not leave any items on the stairs.  Make sure that there are handrails on both sides of the stairs. Fix handrails that are broken or loose. Make sure that handrails are as long as the stairways.  Check any carpeting to make sure that it is firmly attached to the stairs. Fix any carpet that is loose or worn.  Avoid having throw rugs at the top or bottom of stairways, or secure the rugs with carpet tape to prevent them from moving.  Make sure that you have a light switch at the top of the stairs and the bottom of the stairs. If you do not have them, have them installed. WHAT ARE SOME OTHER FALL PREVENTION TIPS?  Wear closed-toe shoes that fit well and support your feet. Wear shoes that have rubber soles or low heels.  When you use a stepladder, make sure that it is completely opened and that the sides are firmly locked. Have someone hold the ladder while you   are using it. Do not climb a closed stepladder.  Add color or contrast paint or tape to grab bars and handrails in your home. Place contrasting color strips on the first and last steps.  Use mobility aids as needed, such as canes, walkers, scooters, and crutches.  Turn on lights if it is dark. Replace any light bulbs that burn out.  Set up furniture so that there are clear paths. Keep the furniture in the same spot.  Fix any uneven floor surfaces.  Choose a carpet design that does not hide the edge of steps of a stairway.  Be aware of any and all pets.  Review your medicines with your healthcare provider. Some medicines can cause dizziness or changes in blood pressure, which increase your risk of falling. Talk  with your health care provider about other ways that you can decrease your risk of falls. This may include working with a physical therapist or trainer to improve your strength, balance, and endurance.   This information is not intended to replace advice given to you by your health care provider. Make sure you discuss any questions you have with your health care provider.   Document Released: 10/10/2002 Document Revised: 03/06/2015 Document Reviewed: 11/24/2014 Elsevier Interactive Patient Education 2016 [employer].  

## 2016-05-09 ENCOUNTER — Ambulatory Visit: Payer: Medicare Other

## 2016-05-13 ENCOUNTER — Ambulatory Visit: Payer: Medicare Other | Admitting: Physical Therapy

## 2016-05-13 NOTE — Therapy (Signed)
[facility] [address] [place], [place], [address] Phone: [phone]   Fax:  [phone]  Physical Therapy Treatment  Patient Details  Name: Connor Wilkerson MRN: [id number] Date of Birth: 1941-08-29 Referring Provider: Dr. Jannifer Franklin  Encounter Date: 05/08/2016      PT End of Session - 05/13/16 0917    Visit Number 2   Number of Visits 9   Date for PT Re-Evaluation 05/25/16   Authorization Type G-CODE and progress note every 10th visit.   PT Start Time 1104   PT Stop Time 1144   PT Time Calculation (min) 40 min   Activity Tolerance Patient tolerated treatment well   Behavior During Therapy WFL for tasks assessed/performed      Past Medical History  Diagnosis Date  . Dyspnea   . Ischemic cardiomyopathy   . ICD (implantable cardiac defibrillator)  BSx   . Atrial fibrillation ([place])   . Myocardial infarction ([place])   . Peripheral vascular disease ([place])   . Near syncope 09/21/2014  . Abnormality of gait 02/07/2015    Past Surgical History  Procedure Laterality Date  . Cardiac defibrillator placement    . Coronary artery bypass graft    . Hernia repair      x2  . Rotator cuff repair Right 2006  . Elbow surgery Left     plastic bone replacement  . Sinus exploration  1987  . Permanent pacemaker generator change N/A 11/25/2013    Procedure: PERMANENT PACEMAKER GENERATOR CHANGE;  Surgeon: Deboraha Sprang, MD;  Location: [facility] CATH LAB;  Service: Cardiovascular;  Laterality: N/A;  . Cardiac catheterization N/A 12/11/2015    Procedure: Left Heart Cath and Coronary Angiography;  Surgeon: Charolette Forward, MD;  Location: [facility] CV LAB;  Service: Cardiovascular;  Laterality: N/A;  . Cardiac defibrillator placement  04/08/2016    NOT MRI SAFE (DOCUMENT SCANNED IN SYSTEM    Filed Vitals:   05/08/16 1100  Pulse: 60    Orthostatic BP: Supine 171/94 HR 60 Standing 133/75 HR 60 After 2 minutes standing 161/80 HR 60 Denies  dizziness          [facility] PT Assessment - 05/13/16 0001    Functional Gait  Assessment   Gait assessed  Yes   Gait Level Surface Walks 20 ft in less than 5.5 sec, no assistive devices, good speed, no evidence for imbalance, normal gait pattern, deviates no more than 6 in outside of the 12 in walkway width.   Change in Gait Speed Able to smoothly change walking speed without loss of balance or gait deviation. Deviate no more than 6 in outside of the 12 in walkway width.   Gait with Horizontal Head Turns Performs head turns smoothly with no change in gait. Deviates no more than 6 in outside 12 in walkway width   Gait with Vertical Head Turns Performs head turns with no change in gait. Deviates no more than 6 in outside 12 in walkway width.   Gait and Pivot Turn Pivot turns safely within 3 sec and stops quickly with no loss of balance.   Step Over Obstacle Is able to step over 2 stacked shoe boxes taped together (9 in total height) without changing gait speed. No evidence of imbalance.   Gait with Narrow Base of Support Ambulates 4-7 steps.   Gait with Eyes Closed Walks 20 ft, no assistive devices, good speed, no evidence of imbalance, normal gait pattern, deviates no more than 6 in  outside 12 in walkway width. Ambulates 20 ft in less than 7 sec.   Ambulating Backwards Walks 20 ft, no assistive devices, good speed, no evidence for imbalance, normal gait   Steps Alternating feet, no rail.   Total Score 28     Performed SLS x 10 sec x 2, Tandem stance x 10 sec x 2, Heel walking along counter 8' x 4, Toe walking 8' x 4 and Tandem gait along counter 8' x 4 all with intermittent UE support.  Educated on Fall Prevention Strategies and provided handout  Gait on level/unlevel surfaces of grass, concrete, pavement x 1100' no device and no LOB.  Gait in clinic with no LOB and no device.        PT Education - 05/13/16 0912    Education provided Yes   Education Details Fall prevention, progress  toward goals, discharge at pt request due to being at baseline   Person(s) Educated Patient;Spouse   Methods Explanation;Handout   Comprehension Verbalized understanding          PT Short Term Goals - 05/13/16 0914    PT SHORT TERM GOAL #1   Title same as LTGs           PT Long Term Goals - 05/13/16 0914    PT LONG TERM GOAL #1   Title Pt will be IND in HEP in order to improve balance and reduce falls. Target date: 05/23/16   Status Deferred   PT LONG TERM GOAL #2   Title Pt will improve FGA score to >/=30/30 in order to reduce falls risk. Target date: 05/23/16   Baseline 28/30 on 05/08/16;pt reports unable to perform tandem gait prior to onset of falls   Status Partially Met   PT LONG TERM GOAL #3   Title Pt will amb. 1000' over even/uneven terrain at IND level to improve functional mobility. Target date: 05/23/16   Baseline >1000' outdoors independent   Status Achieved   PT LONG TERM GOAL #4   Title Pt will report zero falls within last 2 weeks to improve safety. Target date: 05/23/16   Status Achieved   PT LONG TERM GOAL #5   Title Pt will verablize strategies to reduce falls risk at home. Target date: 05/23/16   Status Achieved   PT LONG TERM GOAL #6   Title Assess pt climbing 20 steps and write goal if appropriate (baseball game steps). Target date: 05/23/16   Status Achieved               Plan - 05/13/16 0924    Clinical Impression Statement Pt and wife report feeling like pt is back to baseline and feel ready to d/c from PT.  Have been out in community and report no issues (Up/down steps at ball game without issues which was patients main goal).  Goal #1 deferred, #2 partially met as pt reports not being able to perform tandem gait previously.   Goals 3-6 met.  Discussed with Geoffry Paradise, PT, along with pt and wife who all agree to d/c.     Clinical Impairments Affecting Rehab Potential co-morbidities   PT Next Visit Plan Discharge from PT   Consulted and Agree  with Plan of Care Patient      Patient will benefit from skilled therapeutic intervention in order to improve the following deficits and impairments:   (PT will not directly address pain but will monitor closely)  Visit Diagnosis: Repeated falls  Other abnormalities of gait and mobility  G-Codes - 05/13/16 2426    Functional Assessment Tool Used FGA 28/30   Functional Limitation Mobility: Walking and moving around   Mobility: Walking and Moving Around Goal Status [phone]) At least 1 percent but less than 20 percent impaired, limited or restricted   Mobility: Walking and Moving Around Discharge Status [phone]) At least 1 percent but less than 20 percent impaired, limited or restricted      Problem List Patient Active Problem List   Diagnosis Date Noted  . AKI (acute kidney injury) ([place]) 04/11/2016  . Hypotension 04/11/2016  . Unstable angina ([place]) 12/10/2015  . Abnormality of gait 02/07/2015  . Near syncope 09/21/2014  . Repeated falls 12/15/2013  . OSA (obstructive sleep apnea) 12/15/2013  . HYPERTENSION, HEART CONTROLLED W/ CHF 01/01/2010  . CARDIOMYOPATHY, ISCHEMIC 12/31/2009  . DYSPNEA 12/31/2009  . ICD -[employer] 12/31/2009    Miller,Jennifer L 05/13/2016, 12:57 PM  [facility] [address] [place], [place], [address] Phone: [phone]   Fax:  [phone]  Name: TAYSEN BUSHART MRN: [id number] Date of Birth: Aug 02, 1941    Einar Grad Mathews 05/13/2016 12:57 PM Phone: [phone] Fax: [phone]  PHYSICAL THERAPY DISCHARGE SUMMARY  Visits from Start of Care: 3  Current functional level related to goals / functional outcomes:     PT Long Term Goals - 05/13/16 0914    PT LONG TERM GOAL #1   Title Pt will be IND in HEP in order to improve balance and reduce falls. Target date: 05/23/16   Status Deferred   PT LONG TERM GOAL  #2   Title Pt will improve FGA score to >/=30/30 in order to reduce falls risk. Target date: 05/23/16   Baseline 28/30 on 05/08/16;pt reports unable to perform tandem gait prior to onset of falls   Status Partially Met   PT LONG TERM GOAL #3   Title Pt will amb. 1000' over even/uneven terrain at IND level to improve functional mobility. Target date: 05/23/16   Baseline >1000' outdoors independent   Status Achieved   PT LONG TERM GOAL #4   Title Pt will report zero falls within last 2 weeks to improve safety. Target date: 05/23/16   Status Achieved   PT LONG TERM GOAL #5   Title Pt will verablize strategies to reduce falls risk at home. Target date: 05/23/16   Status Achieved   PT LONG TERM GOAL #6   Title Assess pt climbing 20 steps and write goal if appropriate (baseball game steps). Target date: 05/23/16   Status Achieved        Remaining deficits: Pt feels he is back to baseline and would like to d/c from PT.   Education / Equipment: HEP  Plan: Patient agrees to discharge.  Patient goals were met. Patient is being discharged due to meeting the stated rehab goals.  ?????        G-code and d/c completed by PT. Geoffry Paradise, PT,DPT 05/13/2016 12:57 PM Phone: [phone] Fax: [phone]

## 2016-05-15 ENCOUNTER — Encounter: Payer: Self-pay | Admitting: Surgery

## 2016-05-15 ENCOUNTER — Other Ambulatory Visit: Payer: Medicare Other

## 2016-05-16 ENCOUNTER — Ambulatory Visit: Payer: Medicare Other

## 2016-05-19 ENCOUNTER — Ambulatory Visit (INDEPENDENT_AMBULATORY_CARE_PROVIDER_SITE_OTHER): Payer: Medicare Other | Admitting: Surgery

## 2016-05-19 ENCOUNTER — Encounter: Payer: Self-pay | Admitting: Surgery

## 2016-05-19 VITALS — BP 144/85 | HR 62 | Temp 96.7°F | Resp 16 | Ht 70.0 in | Wt 171.0 lb

## 2016-05-19 DIAGNOSIS — I255 Ischemic cardiomyopathy: Secondary | ICD-10-CM

## 2016-05-19 DIAGNOSIS — R55 Syncope and collapse: Secondary | ICD-10-CM

## 2016-05-19 NOTE — Progress Notes (Signed)
Patient name: Connor Wilkerson MRN: [id number] DOB: [birth year] Sex: male  REASON FOR VISIT: follow up  HPI: Connor Wilkerson is a 75 y.o. male who I initially saw for syncope and carotid stenosis. He had multiple syncopal spells and fell on multiple occasions. He has also had issues with gait instability. He takes a statin for hypercholesterolemia as well as dual antiplatelet therapy. He has an ICD in place. He was recently seen in our office for carotid duplex. He had a mild increase in the right carotid stenosis which was in the 60-79% category.   Recently, his wife describes an episode of dysarthria and garbled speech which lasted approximately an hour. It did resolve with nitroglycerin and oxycodone. He felt that his pacemaker had fired. He was seen by Dr. Caryl Comes who felt everything was okay. He denies any symptoms in his arms or legs other than some chronic tingling in his arms. He denies amaurosis fugax.  I scheduled the patient for a MRI and a CT angiogram to help with the decision-making for whether or not to proceed with carotid endarterectomy. Unfortunately the patient was admitted to the hospital over the weekend with a blood pressure of 62/37 and dizziness. He was found to be in acute renal failure as well likely secondary to dehydration. His blood pressure improved with hydration and holding her blood pressure medicines and diuretics. He did get a noncontrasted CT scan which was negative for acute issues. He did not get any other studies likely because of his creatinine.  The patient was scheduled to get a CT angiogram to better evaluate his right carotid stenosis.  However, because of his elevated creatinine at 1.8, this did not happen.  He is back today to discuss options.  He reports that he has not had any symptoms since he was discharged from the hospital with hypotension  Current Outpatient Prescriptions  Medication Sig Dispense  Refill  . aspirin 81 MG tablet Take 81 mg by mouth every morning.     Marland Kitchen buPROPion (WELLBUTRIN XL) 300 MG 24 hr tablet Take 300 mg by mouth at bedtime. Reported on 03/24/2016    . carvedilol (COREG) 25 MG tablet TAKE 1 TABLET BY MOUTH TWICE A DAY WITH A MEAL 180 tablet 0  . clopidogrel (PLAVIX) 75 MG tablet Take 75 mg by mouth at bedtime.   3  . cyclobenzaprine (FLEXERIL) 5 MG tablet Take 5 mg by mouth 3 (three) times daily as needed for muscle spasms. Reported on 04/25/2016  6  . escitalopram (LEXAPRO) 10 MG tablet Take 10 mg by mouth daily.     . famotidine (PEPCID) 20 MG tablet Take 20 mg by mouth 2 (two) times daily.     Marland Kitchen gabapentin (NEURONTIN) 300 MG capsule Take 300-600 mg by mouth 3 (three) times daily as needed (leg/shoulder pain). May need 600 mg in the evening at night    . nitroGLYCERIN (NITROSTAT) 0.4 MG SL tablet Place 0.4 mg under the tongue every 5 (five) minutes as needed for chest pain.    Marland Kitchen oxyCODONE-acetaminophen (PERCOCET) 10-325 MG tablet Take 1 tablet by mouth 2 (two) times daily as needed for pain.     Marland Kitchen amLODipine (NORVASC) 5 MG tablet Take 5 mg by mouth at bedtime. Reported on 05/19/2016    . isosorbide mononitrate (IMDUR) 60 MG 24 hr tablet Take 60 mg by mouth at bedtime. Reported on 05/19/2016    . rosuvastatin (CRESTOR) 20 MG tablet Take 20 mg by mouth at bedtime.  Reported on 05/19/2016    . sacubitril-valsartan (ENTRESTO) 97-103 MG Take 1 tablet by mouth 2 (two) times daily. Reported on 05/19/2016    . spironolactone (ALDACTONE) 25 MG tablet Take 25 mg by mouth at bedtime. Reported on 05/19/2016  3   No current facility-administered medications for this visit.    REVIEW OF SYSTEMS:  [X]  denotes positive finding, [ ]  denotes negative finding Cardiac  Comments:  Chest pain or chest pressure:    Shortness of breath upon exertion:    Short of breath when lying flat:    Irregular heart rhythm:    Constitutional    Fever or chills:      PHYSICAL EXAM: Filed Vitals:    05/19/16 1432 05/19/16 1435  BP: 150/84 144/85  Pulse: 62   Temp: 96.7 F (35.9 C)   TempSrc: Oral   Resp: 16   Height: 5\' 10"  (1.778 m)   Weight: 171 lb (77.565 kg)   SpO2: 95%     GENERAL: The patient is a well-nourished male, in no acute distress. The vital signs are documented above. CARDIOVASCULAR: There is a regular rate and rhythm. PULMONARY: There is good air exchange bilaterally without wheezing or rales. Palpable right posterior tibial pulse  MEDICAL ISSUES: The patient has not had any symptoms since his hospital visit where his medications were adjusted and his blood pressure has improved.  On ultrasound, his carotid stenosis was in the 60-79% category.  We are unable to get any tests to further evaluate his neurologic status.  He can get an MRI because of a pacemaker and his creatinine is 2 elevated for CT angiogram.  Because the patient has been without symptoms since changing his medications to increase his blood pressure, I feel that that is the most likely explanation for his symptoms.  He has never had any localizing symptoms.  Therefore, I have recommended that we continue with ultrasound surveillance and medical management.  If he has any other symptoms, the next step would be to proceed with angiography.  He is scheduled to follow-up with me in 6 months.  Annamarie Major, MD [facility] [phone] Pager [phone]

## 2016-05-20 ENCOUNTER — Ambulatory Visit: Payer: Medicare Other

## 2016-05-22 DIAGNOSIS — R233 Spontaneous ecchymoses: Secondary | ICD-10-CM | POA: Diagnosis not present

## 2016-05-22 DIAGNOSIS — L821 Other seborrheic keratosis: Secondary | ICD-10-CM | POA: Diagnosis not present

## 2016-05-22 DIAGNOSIS — L570 Actinic keratosis: Secondary | ICD-10-CM | POA: Diagnosis not present

## 2016-05-22 DIAGNOSIS — L578 Other skin changes due to chronic exposure to nonionizing radiation: Secondary | ICD-10-CM | POA: Diagnosis not present

## 2016-05-23 ENCOUNTER — Ambulatory Visit: Payer: Medicare Other

## 2016-05-27 ENCOUNTER — Ambulatory Visit: Payer: Medicare Other

## 2016-05-30 ENCOUNTER — Ambulatory Visit: Payer: Medicare Other

## 2016-05-30 DIAGNOSIS — M84478A Pathological fracture, left toe(s), initial encounter for fracture: Secondary | ICD-10-CM | POA: Diagnosis not present

## 2016-05-30 DIAGNOSIS — M7989 Other specified soft tissue disorders: Secondary | ICD-10-CM | POA: Diagnosis not present

## 2016-05-30 DIAGNOSIS — M79672 Pain in left foot: Secondary | ICD-10-CM | POA: Diagnosis not present

## 2016-05-30 DIAGNOSIS — M109 Gout, unspecified: Secondary | ICD-10-CM | POA: Diagnosis not present

## 2016-06-05 ENCOUNTER — Ambulatory Visit: Payer: Medicare Other | Admitting: Physician Assistant

## 2016-06-06 ENCOUNTER — Encounter: Payer: Self-pay | Admitting: Internal Medicine

## 2016-06-09 DIAGNOSIS — M109 Gout, unspecified: Secondary | ICD-10-CM | POA: Diagnosis not present

## 2016-06-11 ENCOUNTER — Ambulatory Visit (INDEPENDENT_AMBULATORY_CARE_PROVIDER_SITE_OTHER): Payer: Medicare Other | Admitting: *Deleted

## 2016-06-11 DIAGNOSIS — I255 Ischemic cardiomyopathy: Secondary | ICD-10-CM

## 2016-06-11 NOTE — Progress Notes (Signed)
Remote ICD transmission.   

## 2016-06-12 ENCOUNTER — Encounter: Payer: Self-pay | Admitting: Cardiology

## 2016-06-18 LAB — CUP PACEART REMOTE DEVICE CHECK
Battery Remaining Percentage: 100 %
Brady Statistic RA Percent Paced: 71 %
HighPow Impedance: 43 Ohm
Implantable Lead Implant Date: 20060512
Implantable Lead Model: 158
Lead Channel Setting Pacing Pulse Width: 0.4 ms
Lead Channel Setting Sensing Sensitivity: 0.6 mV
MDC IDC LEAD IMPLANT DT: 20060512
MDC IDC LEAD LOCATION: 753859
MDC IDC LEAD LOCATION: 753860
MDC IDC LEAD SERIAL: 159477
MDC IDC MSMT BATTERY REMAINING LONGEVITY: 108 mo
MDC IDC MSMT LEADCHNL RA IMPEDANCE VALUE: 383 Ohm
MDC IDC MSMT LEADCHNL RV IMPEDANCE VALUE: 541 Ohm
MDC IDC PG SERIAL: 112776
MDC IDC SESS DTM: 20170809044000
MDC IDC SET LEADCHNL RA PACING AMPLITUDE: 2 V
MDC IDC SET LEADCHNL RV PACING AMPLITUDE: 2.4 V
MDC IDC STAT BRADY RV PERCENT PACED: 0 %

## 2016-06-23 ENCOUNTER — Encounter: Payer: Medicare Other | Admitting: Physician Assistant

## 2016-06-28 ENCOUNTER — Other Ambulatory Visit: Payer: Self-pay | Admitting: Internal Medicine

## 2016-07-04 DIAGNOSIS — M1A09X1 Idiopathic chronic gout, multiple sites, with tophus (tophi): Secondary | ICD-10-CM | POA: Diagnosis not present

## 2016-07-04 DIAGNOSIS — M79672 Pain in left foot: Secondary | ICD-10-CM | POA: Diagnosis not present

## 2016-07-07 NOTE — Progress Notes (Signed)
Cardiology Office Note Date:  07/08/2016  Patient ID:  Connor Wilkerson, Connor Wilkerson 02-Apr-1941, MRN [id number] PCP:  Ernestene Kiel, MD  Cardiologist: Electrophysiologist: Dr. Caryl Comes   Chief Complaint: planed visit  History of Present Illness: Connor Wilkerson is a 75 y.o. male with history of CAD/CABG, ICM with ICD, chronic CHF, HTN, HLD, CRI (stage III),  ?PAFib, PVD with vascular non-obstructive carotid disease, orthostatic changes, chronic back pain and arthritic pain.  The patient comes in today being seen for Dr. Caryl Comes last seen by him 03/12/16, at that visit no changes were made and the patient feeling well.  His last EP visit was with myself shortly after in May with what the patient thought may have been an ICD shock, though ultimately hadn't been.  He did though mention some other symptoms, concerning for neuro etiology recommended to revisit with neuro, vascular, or ER if needed.He did see neurology who felt hypotension was contributing and his Norvasc stopped and arranged for further testing.  He did though seek attention at the hospital early June for recurrent dizziness, falls noted to be hypotensive, with acute/chronic renal failure and hyperkalemic, dehydration,his medicines adjusted and discharged, not felt to have acute neuro component.  He has seen vascular with further evaluation of his carotid disease in process.  He has known hx of orthostatic dizziness and syncope, reports since the medicines were stopped at his hospital stay he feels like a new person.  Very little if any orthostatic symptoms with standing, no dizziness, near syncope or syncope, no falls..  He has not had any CP, denies palpitations or SOB.  He sees Dr. Arita Miss next month to f/u on carotids.     Device information: BSCi dual chamber ICD, implanted 03/14/05, gen change 11/25/13 to sub-pectoral placement, Dr. Caryl Comes    Past Medical History:  Diagnosis Date  . Abnormality of gait 02/07/2015  . Atrial fibrillation  ([place])   . Dyspnea   . ICD (implantable cardiac defibrillator)  BSx   . Ischemic cardiomyopathy   . Myocardial infarction ([place])   . Near syncope 09/21/2014  . Peripheral vascular disease [facility])     Past Surgical History:  Procedure Laterality Date  . CARDIAC CATHETERIZATION N/A 12/11/2015   Procedure: Left Heart Cath and Coronary Angiography;  Surgeon: Charolette Forward, MD;  Location: [place] CV LAB;  Service: Cardiovascular;  Laterality: N/A;  . CARDIAC DEFIBRILLATOR PLACEMENT    . CARDIAC DEFIBRILLATOR PLACEMENT  04/08/2016   NOT MRI SAFE (DOCUMENT SCANNED IN SYSTEM  . CORONARY ARTERY BYPASS GRAFT    . ELBOW SURGERY Left    plastic bone replacement  . HERNIA REPAIR     x2  . PERMANENT PACEMAKER GENERATOR CHANGE N/A 11/25/2013   Procedure: PERMANENT PACEMAKER GENERATOR CHANGE;  Surgeon: Deboraha Sprang, MD;  Location: [facility] CATH LAB;  Service: Cardiovascular;  Laterality: N/A;  . ROTATOR CUFF REPAIR Right 2006  . SINUS EXPLORATION  1987    Current Outpatient Prescriptions  Medication Sig Dispense Refill  . aspirin 81 MG tablet Take 81 mg by mouth 2 (two) times daily.     Marland Kitchen buPROPion (WELLBUTRIN XL) 300 MG 24 hr tablet Take 300 mg by mouth at bedtime. Reported on 03/24/2016    . carvedilol (COREG) 25 MG tablet Take 25 mg by mouth daily as needed.    . clopidogrel (PLAVIX) 75 MG tablet Take 75 mg by mouth at bedtime.   3  . escitalopram (LEXAPRO) 10 MG tablet Take 10 mg by mouth daily.     Marland Kitchen  famotidine (PEPCID) 20 MG tablet Take 20 mg by mouth 2 (two) times daily.     Marland Kitchen gabapentin (NEURONTIN) 300 MG capsule Take 300-600 mg by mouth 3 (three) times daily as needed (leg/shoulder pain). May need 600 mg in the evening at night    . nitroGLYCERIN (NITROSTAT) 0.4 MG SL tablet Place 0.4 mg under the tongue every 5 (five) minutes as needed for chest pain.    Marland Kitchen oxyCODONE-acetaminophen (PERCOCET) 10-325 MG tablet Take 1 tablet by mouth 2 (two) times daily as needed for pain.     . rosuvastatin  (CRESTOR) 20 MG tablet Take 20 mg by mouth at bedtime. Reported on 05/19/2016     No current facility-administered medications for this visit.     Allergies:   Amlodipine and Tramadol   Social History:  The patient  reports that he quit smoking about 16 years ago. He has never used smokeless tobacco. He reports that he does not drink alcohol or use drugs.   Family History:  The patient's family history includes Heart attack in his brother, father, and mother; Heart disease in his brother, father, and mother.  ROS:  Please see the history of present illness.    All other systems are reviewed and otherwise negative.   PHYSICAL EXAM:  VS:  BP (!) 130/94   Pulse 60   Ht 5\' 10"  (1.778 m)   Wt 170 lb (77.1 kg)   BMI 24.39 kg/m  BMI: Body mass index is 24.39 kg/m. Well nourished, well developed, in no acute distress  HEENT: normocephalic, atraumatic  Neck: no JVD, carotid bruits or masses Cardiac:   RRR; no significant murmurs, no rubs, or gallops Lungs:  clear to auscultation bilaterally, no wheezing, rhonchi or rales  Abd: soft, nontender MS: no deformity or atrophy Ext: no edema  Skin: warm and dry, no rash Neuro:  No gross deficits are appreciated, speech is clear Psych: euthymic mood, full affect  ICD site (sub-pec) is stable, no tethering or discomfort   EKG:  Done 04/11/16 was A paced, V sensed ICD check today shows normal function, no VT, no therapies, no AF or mode switches, no changes made  12/11/15 LHC Conclusion     Ost RCA to Mid RCA lesion, 100% stenosed.  Ost RPDA lesion, 70% stenosed.  Ost Cx lesion, 60% stenosed.  Prox Cx lesion, 80% stenosed.  Prox Cx to Mid Cx lesion, 100% stenosed.  Ost LM lesion, 40% stenosed.  Prox LAD lesion, 40% stenosed.  Mid LAD-1 lesion, 70% stenosed.  Mid LAD-2 lesion, 100% stenosed.  Findings Left main is 30-40% diffuse disease LAD has 40-50% proximal and 70% mid stenosis and then 100% occluded filling by LIMA to  LAD Left circumflex and 50-60% ostial stenosis and 80% proximal stenosis and then 100% occluded beyond the midportion. OM1 is very small is patent RCA is 100% occluded at the ostium Saphenous vein graft to PDA has 70-80% anastomotic stenosis as before at the trifurcation not suitable for PCI Saphenous vein graft to obtuse marginal was patent LIMA to LAD is patent   10/11/13: Echocardiogram Study Conclusions - Left ventricle: Moderate hypokinesis of base/mid inferior wall and base/mid inferolateral wall. The cavity size was normal. Wall thickness was normal. The estimated ejection fraction was 45%. - Left atrium: The atrium was mildly dilated. - Right ventricle: The cavity size was normal. Pacer wire or catheter noted in right ventricle. Systolic function was mildly reduced.   Recent Labs: 04/11/2016: B Natriuretic Peptide 131.7; TSH 3.776  04/12/2016: Hemoglobin 11.3; Platelets 194 04/13/2016: BUN 28; Creatinine, Ser 1.84; Potassium 4.1; Sodium 139  12/11/2015: Cholesterol 156; HDL 47; LDL Cholesterol 87; Total CHOL/HDL Ratio 3.3; Triglycerides 112; VLDL 22   CrCl cannot be calculated (Patient's most recent lab result is older than the maximum 21 days allowed.).   Wt Readings from Last 3 Encounters:  07/08/16 170 lb (77.1 kg)  05/19/16 171 lb (77.6 kg)  04/14/16 168 lb (76.2 kg)     Other studies reviewed: Additional studies/records reviewed today include: summarized above   ASSESSMENT AND PLAN:  1. ICM/ICD     normal device function  2. CHF    exam is euvolemic    On BB/ARB  3. CAD     S/p cath with Dr. Terrence Dupont as above, no intervention     On ASA/plavix, BB, statin, nitrate  4. PAFib, older record notes report as a single event 4 minutes duration Aug 2015     Continue to monitor via device     Has had no AF episodes  5. Carotid disease     Dr. Nechama Guard  6.  Orthostatic history      Check is negative today and without symptoms       He has not had falls  or near syncope/syncope since meds stopped at the hospital      Advised to stand slowly and not walk away from his chair right away, avoid long periods of standing as well    He has significant CAD and an ICM, he is using his coreg only when his BP is >160 or so.  Off his entresto and aldactone.  Will resume daily carvedilol at 3.125mg  BID to start, he is instructed to monitor daily weights off the diuretic, though his weight has remained stable over the last couple months.  Will check BMET today.      Disposition: He sees vascular next month, will see him back in 2 months to try and get an ACE or ARB back on board if possible, though want to avoid hypotension given carotid disease and orthostatic hx   Current medicines are reviewed at length with the patient today.  The patient did not have any concerns regarding medicines.  Haywood Lasso, PA-C 07/08/2016 2:27 PM     [place] [address] [place] [address] [phone] (office)  [phone] (fax)

## 2016-07-08 ENCOUNTER — Encounter: Payer: Self-pay | Admitting: Physician Assistant

## 2016-07-08 ENCOUNTER — Ambulatory Visit (INDEPENDENT_AMBULATORY_CARE_PROVIDER_SITE_OTHER): Payer: Medicare Other | Admitting: Physician Assistant

## 2016-07-08 VITALS — BP 130/94 | HR 60 | Ht 70.0 in | Wt 170.0 lb

## 2016-07-08 DIAGNOSIS — I2510 Atherosclerotic heart disease of native coronary artery without angina pectoris: Secondary | ICD-10-CM | POA: Diagnosis not present

## 2016-07-08 DIAGNOSIS — E875 Hyperkalemia: Secondary | ICD-10-CM

## 2016-07-08 DIAGNOSIS — I255 Ischemic cardiomyopathy: Secondary | ICD-10-CM

## 2016-07-08 DIAGNOSIS — I951 Orthostatic hypotension: Secondary | ICD-10-CM

## 2016-07-08 DIAGNOSIS — N179 Acute kidney failure, unspecified: Secondary | ICD-10-CM | POA: Diagnosis not present

## 2016-07-08 MED ORDER — CARVEDILOL 3.125 MG PO TABS: 3.1250 mg | ORAL_TABLET | Freq: Two times a day (BID) | ORAL | 6 refills | Status: DC

## 2016-07-08 NOTE — Patient Instructions (Signed)
Medication Instructions:   START TAKING CARVEDILOL 3.125 MG TWICE A DAY   If you need a refill on your cardiac medications before your next appointment, please call your pharmacy.  Labwork: BMET  TODAY    Testing/Procedures: NONE ORDER TODAY    Follow-Up: WITH  RENEE URSUY IN 2 MONTHS    Any Other Special Instructions Will Be Listed Below (If Applicable).

## 2016-07-09 LAB — BASIC METABOLIC PANEL WITH GFR
BUN: 18 mg/dL (ref 7–25)
CO2: 23 mmol/L (ref 20–31)
Calcium: 8.9 mg/dL (ref 8.6–10.3)
Chloride: 105 mmol/L (ref 98–110)
Creat: 1.5 mg/dL — ABNORMAL HIGH (ref 0.70–1.18)
Glucose, Bld: 76 mg/dL (ref 65–99)
Potassium: 4.9 mmol/L (ref 3.5–5.3)
Sodium: 139 mmol/L (ref 135–146)

## 2016-07-10 ENCOUNTER — Telehealth: Payer: Self-pay | Admitting: *Deleted

## 2016-07-10 NOTE — Telephone Encounter (Signed)
SPOKE TO PT ABOUT LAB RESULTS AND VERBALIZED UNDERSTANDING AND TO KEEP FOLLOW UP

## 2016-07-10 NOTE — Telephone Encounter (Signed)
-----   Message from [facility] [place] sent at 07/09/2016  4:38 AM EDT ----- Please let the patient know his lab looks good, follow up as planned.  Tommye Standard, PA-C

## 2016-07-14 ENCOUNTER — Ambulatory Visit (INDEPENDENT_AMBULATORY_CARE_PROVIDER_SITE_OTHER): Payer: Medicare Other | Admitting: Neurology

## 2016-07-14 ENCOUNTER — Encounter: Payer: Self-pay | Admitting: Neurology

## 2016-07-14 VITALS — BP 152/90 | HR 72 | Ht 70.0 in | Wt 173.5 lb

## 2016-07-14 DIAGNOSIS — R269 Unspecified abnormalities of gait and mobility: Secondary | ICD-10-CM | POA: Diagnosis not present

## 2016-07-14 DIAGNOSIS — I255 Ischemic cardiomyopathy: Secondary | ICD-10-CM

## 2016-07-14 DIAGNOSIS — R55 Syncope and collapse: Secondary | ICD-10-CM

## 2016-07-14 DIAGNOSIS — G2581 Restless legs syndrome: Secondary | ICD-10-CM | POA: Diagnosis not present

## 2016-07-14 NOTE — Progress Notes (Signed)
Reason for visit: Syncope  Connor Wilkerson is an 75 y.o. male  History of present illness:  Connor Wilkerson is a 75 year old right-handed white male with a history of episodes of syncope and falls. The patient was found to be significantly orthostatic. He was on several blood pressure medications and diuretics. The patient required admission to the hospital, medication adjustments were made, and he has done quite well since that time. The patient has not had any falls or blackouts since he was seen here in June. The patient had some alteration in renal function, but this has returned to at or near baseline. The patient reports a restless leg type syndrome with the right leg that has been present since he had a vein harvested for a bypass procedure. The patient had tingling and discomfort in the right foot and ankle at nighttime, if he gets up and walks this improves. The patient takes gabapentin 300 mg at night.  Past Medical History:  Diagnosis Date  . Abnormality of gait 02/07/2015  . Atrial fibrillation ([place])   . Dyspnea   . ICD (implantable cardiac defibrillator)  BSx   . Ischemic cardiomyopathy   . Myocardial infarction ([place])   . Near syncope 09/21/2014  . Peripheral vascular disease [facility])     Past Surgical History:  Procedure Laterality Date  . CARDIAC CATHETERIZATION N/A 12/11/2015   Procedure: Left Heart Cath and Coronary Angiography;  Surgeon: Charolette Forward, MD;  Location: [place] CV LAB;  Service: Cardiovascular;  Laterality: N/A;  . CARDIAC DEFIBRILLATOR PLACEMENT    . CARDIAC DEFIBRILLATOR PLACEMENT  04/08/2016   NOT MRI SAFE (DOCUMENT SCANNED IN SYSTEM  . CORONARY ARTERY BYPASS GRAFT    . ELBOW SURGERY Left    plastic bone replacement  . HERNIA REPAIR     x2  . PERMANENT PACEMAKER GENERATOR CHANGE N/A 11/25/2013   Procedure: PERMANENT PACEMAKER GENERATOR CHANGE;  Surgeon: Deboraha Sprang, MD;  Location: [facility] CATH LAB;  Service: Cardiovascular;  Laterality: N/A;  . ROTATOR  CUFF REPAIR Right 2006  . SINUS EXPLORATION  1987    Family History  Problem Relation Age of Onset  . Heart disease Mother   . Heart attack Mother   . Heart disease Father   . Heart attack Father   . Heart disease Brother   . Heart attack Brother     Social history:  reports that he quit smoking about 16 years ago. He has never used smokeless tobacco. He reports that he does not drink alcohol or use drugs.    Allergies  Allergen Reactions  . Amlodipine     Pt not sure if this is an allergy  . Tramadol Other (See Comments)    Unknown     Medications:  Prior to Admission medications   Medication Sig Start Date End Date Taking? Authorizing Provider  aspirin 81 MG tablet Take 81 mg by mouth daily.    Yes Historical Provider, MD  buPROPion (WELLBUTRIN XL) 300 MG 24 hr tablet Take 300 mg by mouth at bedtime. Reported on 03/24/2016   Yes Historical Provider, MD  carvedilol (COREG) 3.125 MG tablet Take 1 tablet (3.125 mg total) by mouth 2 (two) times daily. 07/08/16  Yes Renee Dyane Dustman, PA-C  clopidogrel (PLAVIX) 75 MG tablet Take 75 mg by mouth at bedtime.  11/23/15  Yes Historical Provider, MD  colchicine 0.6 MG tablet  07/08/16  Yes Historical Provider, MD  cyclobenzaprine (FLEXERIL) 5 MG tablet  07/08/16  Yes Historical Provider, MD  escitalopram (LEXAPRO) 10 MG tablet Take 10 mg by mouth daily.    Yes Historical Provider, MD  famotidine (PEPCID) 20 MG tablet Take 20 mg by mouth 2 (two) times daily.    Yes Historical Provider, MD  gabapentin (NEURONTIN) 300 MG capsule Take 300-600 mg by mouth at bedtime. May need 600 mg in the evening at night   Yes Historical Provider, MD  nitroGLYCERIN (NITROSTAT) 0.4 MG SL tablet Place 0.4 mg under the tongue every 5 (five) minutes as needed for chest pain.   Yes Historical Provider, MD  oxyCODONE-acetaminophen (PERCOCET) 10-325 MG tablet Take 1 tablet by mouth 2 (two) times daily as needed for pain.    Yes Historical Provider, MD  rosuvastatin  (CRESTOR) 20 MG tablet Take 20 mg by mouth at bedtime. Reported on 05/19/2016   Yes Historical Provider, MD    ROS:  Out of a complete 14 system review of symptoms, the patient complains only of the following symptoms, and all other reviewed systems are negative.  Decreased weight Cold intolerance Restless legs Joint pain, joint swelling, walking difficulty Daytime sleepiness Bruising easily Agitation, confusion, anxiety  Blood pressure (!) 152/90, pulse 72, height 5\' 10"  (1.778 m), weight 173 lb 8 oz (78.7 kg).   Blood pressure, left arm, standing is 134/86.  Physical Exam  General: The patient is alert and cooperative at the time of the examination.  Skin: No significant peripheral edema is noted.   Neurologic Exam  Mental status: The patient is alert and oriented x 3 at the time of the examination. The patient has apparent normal recent and remote memory, with an apparently normal attention span and concentration ability.   Cranial nerves: Facial symmetry is present. Speech is normal, no aphasia or dysarthria is noted. Extraocular movements are full. Visual fields are full.  Motor: The patient has good strength in all 4 extremities, with exception of some slight weakness with intrinsic muscles of the hands bilaterally. The first dorsal interossei are atrophied bilaterally.  Sensory examination: Soft touch sensation is symmetric on the face, arms, and legs.  Coordination: The patient has good finger-nose-finger and heel-to-shin bilaterally.  Gait and station: The patient has a normal gait. Tandem gait is normal. Romberg is negative. No drift is seen.  Reflexes: Deep tendon reflexes are symmetric, but are depressed.   Assessment/Plan:  1. Syncope/orthostatic hypotension  2. Restless leg syndrome, right leg  The patient will be going up on the gabapentin taking 600 mg at night for the restless leg symptoms. He has done quite well with medication adjustment with his  episodes of syncope and falling. The patient will follow-up through this office on an as-needed basis.  Connor Alexanders MD 07/14/2016 1:45 PM  [facility] [address] [place], [place] [address]  Phone [phone] Fax [phone]

## 2016-07-15 DIAGNOSIS — M2550 Pain in unspecified joint: Secondary | ICD-10-CM | POA: Diagnosis not present

## 2016-07-15 DIAGNOSIS — M84475A Pathological fracture, left foot, initial encounter for fracture: Secondary | ICD-10-CM | POA: Diagnosis not present

## 2016-07-15 DIAGNOSIS — M79641 Pain in right hand: Secondary | ICD-10-CM | POA: Diagnosis not present

## 2016-07-15 DIAGNOSIS — I25118 Atherosclerotic heart disease of native coronary artery with other forms of angina pectoris: Secondary | ICD-10-CM | POA: Diagnosis not present

## 2016-07-15 DIAGNOSIS — M1A9XX1 Chronic gout, unspecified, with tophus (tophi): Secondary | ICD-10-CM | POA: Diagnosis not present

## 2016-07-15 DIAGNOSIS — M79642 Pain in left hand: Secondary | ICD-10-CM | POA: Diagnosis not present

## 2016-07-15 DIAGNOSIS — L608 Other nail disorders: Secondary | ICD-10-CM | POA: Diagnosis not present

## 2016-07-21 DIAGNOSIS — R0982 Postnasal drip: Secondary | ICD-10-CM | POA: Diagnosis not present

## 2016-07-21 DIAGNOSIS — J029 Acute pharyngitis, unspecified: Secondary | ICD-10-CM | POA: Diagnosis not present

## 2016-08-15 DIAGNOSIS — Z23 Encounter for immunization: Secondary | ICD-10-CM | POA: Diagnosis not present

## 2016-09-14 NOTE — Progress Notes (Signed)
Cardiology Office Note Date:  09/15/2016  Patient ID:  Tennison, Slomka [birth year], MRN [id number] PCP:  Ernestene Kiel, MD  Cardiologist: Electrophysiologist: Dr. Caryl Comes   Chief Complaint: planed visit  History of Present Illness: Connor Wilkerson is a 75 y.o. male with history of CAD/CABG, ICM with ICD, chronic CHF, HTN, HLD, CRI (stage III),  ?PAFib, PVD with vascular non-obstructive carotid disease, orthostatic changes, chronic back pain and arthritic pain.  The patient comes in today being seen for Dr. Caryl Comes last seen by him 03/12/16, at that visit no changes were made and the patient feeling well.  He was then seen by myself myself shortly after in May with what the patient thought may have been an ICD shock, though ultimately hadn't been.  He did though mention some other symptoms, concerning for neuro etiology recommended to revisit with neuro, vascular, or ER if needed.He did see neurology who felt hypotension was contributing and his Norvasc stopped and arranged for further testing.  He did though seek attention at the hospital early June for recurrent dizziness, falls noted to be hypotensive, with acute/chronic renal failure and hyperkalemic, dehydration,his medicines adjusted and discharged, not felt to have acute neuro component.    His last EP visit was again by myself in Sept, at that time he had seen vascular with further evaluation of his carotid disease in process at that time, though with renal disease angio was not undertaken and vascular recommendations were to monitor clinical symptoms and surveillence [place] of his carotids feeling hypotension was the etiology of the event, he saw neuro who added gabapentin for RLS and PRN f/u planned with them.  He has known hx of orthostatic dizziness and syncope, reports since the medicines were stopped at his hospital stay he feels like a new person.  He denies any kind of symptoms though since his last visit, says again how good he  feels.   He has not had any CP, denies palpitations or SOB.  NO dizziness, near syncope or syncope, no shocks from his device.  In discussion, his medication list appears to be inaccurate.  He tells me today that he never stopped the carvedilol or Entresto, has been taking them all along, seems just the aldactone and Norvasc was stopped at the last ED visit in June.     Device information: BSCi dual chamber ICD, implanted 03/14/05, gen change 11/25/13 to sub-pectoral placement, Dr. Caryl Comes    Past Medical History:  Diagnosis Date  . Abnormality of gait 02/07/2015  . Atrial fibrillation ([place])   . Dyspnea   . ICD (implantable cardiac defibrillator)  BSx   . Ischemic cardiomyopathy   . Myocardial infarction   . Near syncope 09/21/2014  . Peripheral vascular disease [facility])     Past Surgical History:  Procedure Laterality Date  . CARDIAC CATHETERIZATION N/A 12/11/2015   Procedure: Left Heart Cath and Coronary Angiography;  Surgeon: Charolette Forward, MD;  Location: [place] [facility];  Service: Cardiovascular;  Laterality: N/A;  . CARDIAC DEFIBRILLATOR PLACEMENT    . CARDIAC DEFIBRILLATOR PLACEMENT  04/08/2016   NOT MRI SAFE (DOCUMENT SCANNED IN SYSTEM  . CORONARY ARTERY BYPASS GRAFT    . ELBOW SURGERY Left    plastic bone replacement  . HERNIA REPAIR     x2  . PERMANENT PACEMAKER GENERATOR CHANGE N/A 11/25/2013   Procedure: PERMANENT PACEMAKER GENERATOR CHANGE;  Surgeon: Deboraha Sprang, MD;  Location: [facility] CATH LAB;  Service: Cardiovascular;  Laterality: N/A;  . ROTATOR  CUFF REPAIR Right 2006  . SINUS EXPLORATION  1987    Current Outpatient Prescriptions  Medication Sig Dispense Refill  . aspirin 81 MG tablet Take 81 mg by mouth daily.     Marland Kitchen buPROPion (WELLBUTRIN XL) 300 MG 24 hr tablet Take 300 mg by mouth at bedtime. Reported on 03/24/2016    . carvedilol (COREG) 3.125 MG tablet Take 1 tablet (3.125 mg total) by mouth 2 (two) times daily. 60 tablet 6  . clopidogrel (PLAVIX) 75 MG tablet Take 75  mg by mouth at bedtime.   3  . colchicine 0.6 MG tablet     . cyclobenzaprine (FLEXERIL) 5 MG tablet     . escitalopram (LEXAPRO) 10 MG tablet Take 10 mg by mouth daily.     . famotidine (PEPCID) 20 MG tablet Take 20 mg by mouth 2 (two) times daily.     Marland Kitchen FLUZONE HIGH-DOSE 0.5 ML SUSY TO BE ADMINISTERED BY PHARMACIST FOR IMMUNIZATION  0  . gabapentin (NEURONTIN) 300 MG capsule Take 300-600 mg by mouth at bedtime. May need 600 mg in the evening at night    . isosorbide mononitrate (IMDUR) 60 MG 24 hr tablet Take 60 mg by mouth daily.    . nitroGLYCERIN (NITROSTAT) 0.4 MG SL tablet Place 0.4 mg under the tongue every 5 (five) minutes as needed for chest pain.    Marland Kitchen oxyCODONE-acetaminophen (PERCOCET) 10-325 MG tablet Take 1 tablet by mouth 2 (two) times daily as needed for pain.     . rosuvastatin (CRESTOR) 20 MG tablet Take 20 mg by mouth at bedtime. Reported on 05/19/2016    . sacubitril-valsartan (ENTRESTO) 97-103 MG Take 1 tablet by mouth 2 (two) times daily.     No current facility-administered medications for this visit.     Allergies:   Amlodipine and Tramadol   Social History:  The patient  reports that he quit smoking about 17 years ago. He has never used smokeless tobacco. He reports that he does not drink alcohol or use drugs.   Family History:  The patient's family history includes Heart attack in his brother, father, and mother; Heart disease in his brother, father, and mother.  ROS:  Please see the history of present illness.    All other systems are reviewed and otherwise negative.   PHYSICAL EXAM:  VS:  BP (!) 160/100   Pulse 80   Ht 5\' 10"  (1.778 m)   Wt 170 lb (77.1 kg)   BMI 24.39 kg/m  BMI: Body mass index is 24.39 kg/m. Well nourished, well developed, in no acute distress  HEENT: normocephalic, atraumatic  Neck: no JVD, carotid bruits or masses Cardiac:   RRR; no significant murmurs, no rubs, or gallops Lungs:  clear to auscultation bilaterally, no wheezing,  rhonchi or rales  Abd: soft, nontender MS: no deformity or atrophy Ext: no edema  Skin: warm and dry, no rash Neuro:  No gross deficits are appreciated, speech is clear Psych: euthymic mood, full affect  ICD site (sub-pec) is stable, no tethering or discomfort   EKG:  Done 04/11/16 was A paced, V sensed ICD check today shows normal function, no VT, no therapies, one AF episode of 4 seconds only, no changes made. PVC's noted, are significantly less then prior checks.  12/11/15 LHC Conclusion     Ost RCA to Mid RCA lesion, 100% stenosed.  Ost RPDA lesion, 70% stenosed.  Ost Cx lesion, 60% stenosed.  Prox Cx lesion, 80% stenosed.  Prox Cx to  Mid Cx lesion, 100% stenosed.  Ost LM lesion, 40% stenosed.  Prox LAD lesion, 40% stenosed.  Mid LAD-1 lesion, 70% stenosed.  Mid LAD-2 lesion, 100% stenosed.  Findings Left main is 30-40% diffuse disease LAD has 40-50% proximal and 70% mid stenosis and then 100% occluded filling by LIMA to LAD Left circumflex and 50-60% ostial stenosis and 80% proximal stenosis and then 100% occluded beyond the midportion. OM1 is very small is patent RCA is 100% occluded at the ostium Saphenous vein graft to PDA has 70-80% anastomotic stenosis as before at the trifurcation not suitable for PCI Saphenous vein graft to obtuse marginal was patent LIMA to LAD is patent   10/11/13: Echocardiogram Study Conclusions - Left ventricle: Moderate hypokinesis of base/mid inferior wall and base/mid inferolateral wall. The cavity size was normal. Wall thickness was normal. The estimated ejection fraction was 45%. - Left atrium: The atrium was mildly dilated. - Right ventricle: The cavity size was normal. Pacer wire or catheter noted in right ventricle. Systolic function was mildly reduced.   Recent Labs: 04/11/2016: B Natriuretic Peptide 131.7; TSH 3.776 04/12/2016: Hemoglobin 11.3; Platelets 194 07/08/2016: BUN 18; Creat 1.50; Potassium 4.9; Sodium  139  12/11/2015: Cholesterol 156; HDL 47; LDL Cholesterol 87; Total CHOL/HDL Ratio 3.3; Triglycerides 112; VLDL 22   CrCl cannot be calculated (Patient's most recent lab result is older than the maximum 21 days allowed.).   Wt Readings from Last 3 Encounters:  09/15/16 170 lb (77.1 kg)  07/14/16 173 lb 8 oz (78.7 kg)  07/08/16 170 lb (77.1 kg)     Other studies reviewed: Additional studies/records reviewed today include: summarized above   ASSESSMENT AND PLAN:  1. ICM/ICD     normal device function, no changes made  2. CHF    exam is euvolemic    On BB/ARB  3. CAD     S/p cath with Dr. Terrence Dupont as above, no intervention     On ASA/plavix, BB, statin, nitrate  4. PAFib, older record notes report as a single event 4 minutes duration Aug 2015     Continue to monitor via device     One 4 second ATR noted since last check only.     5. Carotid disease     Dr. Nechama Guard  6.  Orthostatic history       Not an ongoing c/o       Advised to stand slowly and not walk away from his chair right away, avoid long periods of standing as well  7. HTN     Elevated here today, he feels strongly is because he is here     Reports that his BP at home is better, reporting 160/90     We discussed at length need for better BP control in balance with avoiding lower BP particularly with know element of carotid disease      He is very reluctant to add on any further medicines since his BP got so low in June.  He will keep daily BP log and bring his home readings and cuff to an RN or [place] visit in 1-2 weeks to better assess his BP control.      Disposition: 6 months with Dr. Caryl Comes, sooner if needed.  Current medicines are reviewed at length with the patient today.  The patient did not have any concerns regarding medicines.  Haywood Lasso, PA-C 09/15/2016 2:31 PM     [address] [place] [address] [phone])  [phone] (office)  [phone] (fax)

## 2016-09-15 ENCOUNTER — Encounter: Payer: Self-pay | Admitting: Physician Assistant

## 2016-09-15 ENCOUNTER — Encounter (INDEPENDENT_AMBULATORY_CARE_PROVIDER_SITE_OTHER): Payer: Self-pay

## 2016-09-15 ENCOUNTER — Ambulatory Visit (INDEPENDENT_AMBULATORY_CARE_PROVIDER_SITE_OTHER): Payer: Medicare Other | Admitting: Physician Assistant

## 2016-09-15 VITALS — BP 160/100 | HR 80 | Ht 70.0 in | Wt 170.0 lb

## 2016-09-15 DIAGNOSIS — I10 Essential (primary) hypertension: Secondary | ICD-10-CM

## 2016-09-15 DIAGNOSIS — I429 Cardiomyopathy, unspecified: Secondary | ICD-10-CM

## 2016-09-15 DIAGNOSIS — I255 Ischemic cardiomyopathy: Secondary | ICD-10-CM | POA: Diagnosis not present

## 2016-09-15 DIAGNOSIS — I2510 Atherosclerotic heart disease of native coronary artery without angina pectoris: Secondary | ICD-10-CM | POA: Diagnosis not present

## 2016-09-15 DIAGNOSIS — I5022 Chronic systolic (congestive) heart failure: Secondary | ICD-10-CM | POA: Diagnosis not present

## 2016-09-15 NOTE — Patient Instructions (Addendum)
Medication Instructions:   Your physician recommends that you continue on your current medications as directed. Please refer to the Current Medication list given to you today.  If you need a refill on your cardiac medications before your next appointment, please call your pharmacy.  Labwork: NONE ORDERED  TODAY   Testing/Procedures: NONE ORDERED  TODAY    Follow-Up:  WITH PHARM-D IN 1 TO 2 WEEKS BLOOD PRESSURE CHECK   Your physician wants you to follow-up in:  IN [place] will receive a reminder letter in the mail two months in advance. If you don't receive a letter, please call our office to schedule the follow-up appointment.   Remote monitoring is used to monitor your Pacemaker of ICD from home. This monitoring reduces the number of office visits required to check your device to one time per year. It allows [place] to keep an eye on the functioning of your device to ensure it is working properly. You are scheduled for a device check from home on .Marland KitchenMarland Kitchen2/10/2017.Marland KitchenMarland KitchenYou may send your transmission at any time that day. If you have a wireless device, the transmission will be sent automatically. After your physician reviews your transmission, you will receive a postcard with your next transmission date.     Any Other Special Instructions Will Be Listed Below (If Applicable).

## 2016-09-18 ENCOUNTER — Other Ambulatory Visit: Payer: Self-pay | Admitting: *Deleted

## 2016-09-18 DIAGNOSIS — I6523 Occlusion and stenosis of bilateral carotid arteries: Secondary | ICD-10-CM

## 2016-09-22 ENCOUNTER — Ambulatory Visit (INDEPENDENT_AMBULATORY_CARE_PROVIDER_SITE_OTHER): Payer: Medicare Other | Admitting: Pharmacist

## 2016-09-22 ENCOUNTER — Encounter: Payer: Self-pay | Admitting: Pharmacist

## 2016-09-22 VITALS — BP 180/100 | HR 63

## 2016-09-22 DIAGNOSIS — I5021 Acute systolic (congestive) heart failure: Secondary | ICD-10-CM

## 2016-09-22 DIAGNOSIS — I255 Ischemic cardiomyopathy: Secondary | ICD-10-CM

## 2016-09-22 DIAGNOSIS — I10 Essential (primary) hypertension: Secondary | ICD-10-CM | POA: Diagnosis not present

## 2016-09-22 LAB — BASIC METABOLIC PANEL
BUN: 20 mg/dL (ref 7–25)
CALCIUM: 8.9 mg/dL (ref 8.6–10.3)
CO2: 30 mmol/L (ref 20–31)
CREATININE: 1.44 mg/dL — AB (ref 0.70–1.18)
Chloride: 106 mmol/L (ref 98–110)
GLUCOSE: 70 mg/dL (ref 65–99)
Potassium: 3.9 mmol/L (ref 3.5–5.3)
Sodium: 140 mmol/L (ref 135–146)

## 2016-09-22 NOTE — Patient Instructions (Signed)
Obtain blood work today.   The clinic will call you tomorrow with the results and guide you with further medication treatment.

## 2016-09-22 NOTE — Progress Notes (Signed)
Patient ID: Connor Wilkerson                 DOB: 12-19-1940                      MRN: [id number]     HPI: Connor Wilkerson is a 75 y.o. male patient of Dr. Caryl Comes referred by Tommye Standard, PA to HTN clinic. PMH of CAD/CABG, ICM with ICD, chronic CHF (EF 35-40% 04/12/16), HTN, HLD, CKD III, PAFib, PVD with vascular non-obstructive carotid disease, orthostatic changes, chronic back pain and arthritic pain. At last OV on 11/13, pt's BP was measured at 160/100 mmHg which he states was high because he was in clinic. Pt was very reluctant to have another medication added since his BP readings this past June were very low (SBP 80-90s mmHg). He was advised of the importance for adequate BP control given his PMH. Pt said he would keep a journal of home BP readings and bring to pharmacy clinic visit.   Patient states that his spironolactone 25mg , Imdur 60 mg, and Entresto 97-103 mg BID were discontinued 3 months ago after he was rushed to the hospital for a BP of 60/70 mmHg. However, discharge summary states that patient should have continued these. He endorses dizziness and low BP during those treatments leading up to his hospitalization. He is still taking carvedilol 3.125 mg BID. Has not been on a diuretic for HF given stable fluid status. Patient brought his medication bottles to the office and stated that he has been on several medications in the past for BP control and/or heart failure, but was never explained why medication changes were made or what medications should be discontinued or taken concomitantly. Patient has difficulty with understanding medication regimen and indications. His wife is with him today, who seems to be a better historian and have a better understanding of patient's medications.  He reports that his blood pressure readings fluctuate and attributes some of that to uncontrolled neurological pain. He brought his wrist BP cuff to the clinic and measured his BP at 155/102. BP cuff in clinic  measured his BP much higher at 180/100 mmHg.   Discussed with pt the possibility of increasing his Crestor dose to lower his LDL to a goal of <70 mg/dL.  Current HTN meds: carvedilol 3.125 mg BID Previously tried: Quinapril 20 mg and 40 mg daily, candesartan 32 mg daily, valsartan 320 mg daily, spironolactone 25 mg daily (d/c 3 mths ago, BP too low), amlodipine 5 mg daily (d/c 3 months ago, BP too low), hydralazine 10 mg daily, carvedilol 25mg  BID (dose decreased 3 months ago, BP low), Entresto 97-103 mg BID (d/c 3 months ago, BP low), Imdur (d/c 3 months ago, low BP) BP goal: <130/80 mmHg (new HTN guidelines)  Family History: The patient's family history includes MI in his brother, father, and mother; Heart disease in his brother, father, and mother  Social History: Reports that he quit smoking about 17 years ago. He has never used smokeless tobacco. He reports that he does not drink alcohol or use drugs.   Home BP readings: labile blood pressure with most readings in the150s/80s mmHg, but high readings in 170s/80s mmHg and low readings in 120s/60s mmHg  Wt Readings from Last 3 Encounters:  09/15/16 170 lb (77.1 kg)  07/14/16 173 lb 8 oz (78.7 kg)  07/08/16 170 lb (77.1 kg)   BP Readings from Last 3 Encounters:  09/15/16 (!) 160/100  07/14/16 Marland Kitchen)  152/90  07/08/16 (!) 130/94   Pulse Readings from Last 3 Encounters:  09/15/16 80  07/14/16 72  07/08/16 60    Renal function: CrCl cannot be calculated (Patient's most recent lab result is older than the maximum 21 days allowed.).  Past Medical History:  Diagnosis Date  . Abnormality of gait 02/07/2015  . Atrial fibrillation ([place])   . Dyspnea   . ICD (implantable cardiac defibrillator)  BSx   . Ischemic cardiomyopathy   . Myocardial infarction   . Near syncope 09/21/2014  . Peripheral vascular disease [facility])     Current Outpatient Prescriptions on File Prior to Visit  Medication Sig Dispense Refill  . aspirin 81 MG tablet Take  81 mg by mouth daily.     Marland Kitchen buPROPion (WELLBUTRIN XL) 300 MG 24 hr tablet Take 300 mg by mouth at bedtime. Reported on 03/24/2016    . carvedilol (COREG) 3.125 MG tablet Take 1 tablet (3.125 mg total) by mouth 2 (two) times daily. 60 tablet 6  . clopidogrel (PLAVIX) 75 MG tablet Take 75 mg by mouth at bedtime.   3  . colchicine 0.6 MG tablet     . cyclobenzaprine (FLEXERIL) 5 MG tablet     . escitalopram (LEXAPRO) 10 MG tablet Take 10 mg by mouth daily.     . famotidine (PEPCID) 20 MG tablet Take 20 mg by mouth 2 (two) times daily.     Marland Kitchen FLUZONE HIGH-DOSE 0.5 ML SUSY TO BE ADMINISTERED BY PHARMACIST FOR IMMUNIZATION  0  . gabapentin (NEURONTIN) 300 MG capsule Take 300-600 mg by mouth at bedtime. May need 600 mg in the evening at night    . isosorbide mononitrate (IMDUR) 60 MG 24 hr tablet Take 60 mg by mouth daily.    . nitroGLYCERIN (NITROSTAT) 0.4 MG SL tablet Place 0.4 mg under the tongue every 5 (five) minutes as needed for chest pain.    Marland Kitchen oxyCODONE-acetaminophen (PERCOCET) 10-325 MG tablet Take 1 tablet by mouth 2 (two) times daily as needed for pain.     . rosuvastatin (CRESTOR) 20 MG tablet Take 20 mg by mouth at bedtime. Reported on 05/19/2016    . sacubitril-valsartan (ENTRESTO) 97-103 MG Take 1 tablet by mouth 2 (two) times daily.     No current facility-administered medications on file prior to visit.     Allergies  Allergen Reactions  . Amlodipine     Pt not sure if this is an allergy  . Tramadol Other (See Comments)    Unknown      Assessment/Plan:  1. Hypertension - Patient's BP today was above the goal of <130/80 mmHg on suboptimal medications given hx of HFrEF and ACS. He is currently taking only carvedilol 3.125 mg BID. He was previously on a higher dose of carvedilol, Entresto, spironolactone, and Imdur, but states that these were discontinued due to hypotension but we do not have records of this. His hypotensive episodes this past year may be attributed to confusion  with the different medication changes. Pt seems to have limited understanding of his medication regimen; his wife's is slightly better. Would like to restart Entresto 49-51 mg BID for both HF and HTN pending today's BMET results since most recent report showed an elevated K of 4.9.    BMET results show a K of 3.9. Called pt to let him know to begin Entresto 49-51 mg BID. Follow-up in clinic in 10 days to assess tolerability and recheck BMET.   2. Hyperlipidemia - lipid panel  from 12/11/2015 showed an LDL of 87 mg/dL which is above the goal of <70 mg/dL. No change was made today since patient seemed overwhelmed with HTN management and medications. Will discuss Crestor dose increase with pt at next visit.    Patient seen by Leroy Libman, P4 pharmacy student.   Vaniya Augspurger E. Nehal Witting, PharmD, [place] [id number] [address] [place], [place] [address] Phone: [phone]; Fax: [phone] 09/22/2016 4:14 PM

## 2016-09-23 ENCOUNTER — Telehealth: Payer: Self-pay | Admitting: Internal Medicine

## 2016-09-23 NOTE — Telephone Encounter (Signed)
Returned call to pt's wife. She states that patient was told instructions regarding his medications but that they "went in 1 ear and out the other." He did have a decent amount of medication-related confusion at OV yesterday. Advised pt's wife that pt is to restart Entresto 49-51mg  BID and we will follow up in 1 week for BMET and BP check. See verbalized understanding.

## 2016-09-23 NOTE — Telephone Encounter (Signed)
Pt was called this am with med changes after pharmacy appt yesterday-pt's wife needs some clarificaition-pt a bit confused  pls call [phone]

## 2016-10-01 NOTE — Progress Notes (Signed)
Patient ID: [name]                 DOB: [birth year]                      MRN: [id number]     HPI: Connor Wilkerson is a 75 y.o. male patient of Dr. Caryl Comes referred by Tommye Standard, PA to HTN clinic. PMH of CAD/CABG, ICM with ICD, chronic CHF (EF 35-40% 04/12/16), HTN, HLD, CKD III, PAFib, PVD with vascular non-obstructive carotid disease, orthostatic changes, chronic back pain and arthritic pain. Patient's BP at last pharmacy visit was above the goal of <130/80 mmHg on suboptimal medications given hx of HFrEF and ACS. He was only taking carvedilol 3.125 mg BID for CVD and HTN and had self- discontinued taking higher dose of carvedilol, Entresto, spironolactone, and Imdur due to hypotension 5 months ago and had not mentioned this to his doctors. His hypotensive episodes this past year may have been attributed to confusion with the different medication changes. Pt seems to have limited understanding of his medication regimen; his wife's is slightly better.   Patient came to clinic today alone without his wife. He brought in his new blood pressure cuff and measured his blood pressure at 209/124 mmHg (left arm), HR 72. Subsequently, his blood pressure was measured using the clinic BP cuff and was 210/110 mmHg (right arm), HR 80. Pt states his home blood pressure readings over the past 2 weeks have been in the 200s/90s mmHg, which he thought his BP cuff was incorrect. He denies symptoms of CP, SOB, blurred vision, dizziness, syncope and HA and states he feels fine. Gave pt 0.2 mg clonidine tablets while in the clinic and measured his blood pressure again with manual BP cuff which read 210/110 mmHg, HR decreased to 66. Obtained an automatic BP cuff and measured his blood pressure again, readings were 178/120 mmHg (left arm) and 199/117 mmHg (right arm), HR 75. Waited a few more minutes, then obtained another reading with the mechanical BP cuff on the left arm which measured at 205/127 mmHg, HR 68. There was a  notable discrepancy in systolic BP readings between the left and right arm, likely due to patient's PVD.  Discussed with Dr. Burt Knack, DOD of the day, if pt was stable to go home since blood pressure readings remain above 200s/100s mmHg after administering 0.2 mg of clonidine. He verbalized that pt was safe to drive home.  Pt's lipid panel from 12/11/2015 showed an LDL of 87 mg/dL which is above the goal of <70 mg/dL. No change were made at last visit since patient seemed overwhelmed with HTN management and medications. Pt is agreeable to starting a higher dose of Crestor to bring him to goal.    Current HTN meds: carvedilol 3.125 mg BID, Entresto 49-51 mg BID  Previously tried: Quinapril 20 mg and 40 mg daily, candesartan 32 mg daily, valsartan 320 mg daily, spironolactone 25 mg daily (d/c 3 mths ago, BP too low), amlodipine 5 mg daily (d/c 3 months ago, BP too low), hydralazine 10 mg daily, carvedilol 25mg  BID (dose decreased 3 months ago, BP low), Entresto 97-103 mg BID (d/c 3 months ago, BP low), Imdur (d/c 3 months ago, low BP) BP goal: <130/80 mmHg (new HTN guidelines)  Family History: The patient's family history includes MI in his brother, father, and mother; Heart disease in his brother, father, and mother  Social History: Reports that he quit smoking about 48  years ago. He has never used smokeless tobacco. He reports that he does not drink alcohol or use drugs.  Diet: Eats very little breakfast, eats mostly supper consisting of steak, vegetables, spaghetti from [place]. Adds salt to food in the morning. Doesn't drink coffee, but drinks 3 cans of pepsi.   Exercise: goes fishing when it's warm  Home BP readings: 200s/90s mmHg over the last 2 weeks with a brand new bicep BP cuff  Wt Readings from Last 3 Encounters:  09/15/16 170 lb (77.1 kg)  07/14/16 173 lb 8 oz (78.7 kg)  07/08/16 170 lb (77.1 kg)   BP Readings from Last 3 Encounters:  09/22/16 (!) 180/100  09/15/16 (!) 160/100    07/14/16 (!) 152/90   Pulse Readings from Last 3 Encounters:  09/22/16 63  09/15/16 80  07/14/16 72    Renal function: CrCl cannot be calculated (Unknown ideal weight.).  Past Medical History:  Diagnosis Date  . Abnormality of gait 02/07/2015  . Atrial fibrillation ([place])   . Dyspnea   . ICD (implantable cardiac defibrillator)  BSx   . Ischemic cardiomyopathy   . Myocardial infarction   . Near syncope 09/21/2014  . Peripheral vascular disease [facility])     Current Outpatient Prescriptions on File Prior to Visit  Medication Sig Dispense Refill  . aspirin 81 MG tablet Take 81 mg by mouth daily.     Marland Kitchen buPROPion (WELLBUTRIN XL) 300 MG 24 hr tablet Take 300 mg by mouth at bedtime. Reported on 03/24/2016    . carvedilol (COREG) 3.125 MG tablet Take 1 tablet (3.125 mg total) by mouth 2 (two) times daily. 60 tablet 6  . clopidogrel (PLAVIX) 75 MG tablet Take 75 mg by mouth at bedtime.   3  . colchicine 0.6 MG tablet     . cyclobenzaprine (FLEXERIL) 5 MG tablet     . escitalopram (LEXAPRO) 10 MG tablet Take 10 mg by mouth daily.     . famotidine (PEPCID) 20 MG tablet Take 20 mg by mouth 2 (two) times daily.     Marland Kitchen FLUZONE HIGH-DOSE 0.5 ML SUSY TO BE ADMINISTERED BY PHARMACIST FOR IMMUNIZATION  0  . gabapentin (NEURONTIN) 300 MG capsule Take 300-600 mg by mouth at bedtime. May need 600 mg in the evening at night    . isosorbide mononitrate (IMDUR) 60 MG 24 hr tablet Take 60 mg by mouth daily.    . nitroGLYCERIN (NITROSTAT) 0.4 MG SL tablet Place 0.4 mg under the tongue every 5 (five) minutes as needed for chest pain.    Marland Kitchen oxyCODONE-acetaminophen (PERCOCET) 10-325 MG tablet Take 1 tablet by mouth 2 (two) times daily as needed for pain.     . rosuvastatin (CRESTOR) 20 MG tablet Take 20 mg by mouth at bedtime. Reported on 05/19/2016    . sacubitril-valsartan (ENTRESTO) 49-51 MG Take 1 tablet by mouth 2 (two) times daily.     No current facility-administered medications on file prior to visit.      Allergies  Allergen Reactions  . Amlodipine     Pt not sure if this is an allergy  . Tramadol Other (See Comments)    Unknown      Assessment/Plan:  1. Hypertension/HFrEF med optimization - Pt's blood pressure of 210/110 mmHg today was well above the goal <130/80 mmHg and in the range of hypertensive emergency. Initiation of clonidine 0.2 mg in clinic did not improve readings significantly. Pt's blood pressure differed between the right and left arm by ~20  points which may be attributed to PVD and carotid artery disease. Pt's current antihypertensive/heart failure therapy is not maximized since pt had self-discontinued most of his medications due to low blood pressure readings over the summer and is concerned about it recurring if medications are reinitiated. Given extremely elevated BP readings today, will increase Entresto to 97-103 mg BID, increase carvedilol to 6.25 mg BID, and initiate spironolactone 12.5mg  daily for further BP control. BMET today was stable with K of 4 and SCr of 1.54. Advised pt to contiunue home blood pressure readings. Will obtain another BMET one week from today and have pt follow-up with in clinic in 2 weeks for a BP check.   Called wife to explain changes made to medication and encouraged that Connor Wilkerson limit his intake of caffeine. Also let her know about upcoming clinic visits. She verbalized understanding.   2. Hyperlipidemia - Pt's LDL of 87 mg/dL is above the goal of <70 mg/dl for ASCVD. Will increase Crestor to 40 mg daily. Will schedule a 3 month lipid panel at next HTN follow-up appointment.    Patient seen in clinic by Leroy Libman, P4 pharmacy student.   Megan E. Supple, PharmD, [place] [id number], [place], [place] [address] Phone: [phone]; Fax: [phone] 10/02/2016 4:27 PM

## 2016-10-02 ENCOUNTER — Encounter: Payer: Self-pay | Admitting: Pharmacist

## 2016-10-02 ENCOUNTER — Ambulatory Visit (INDEPENDENT_AMBULATORY_CARE_PROVIDER_SITE_OTHER): Payer: Medicare Other | Admitting: Pharmacist

## 2016-10-02 VITALS — BP 205/127 | HR 68

## 2016-10-02 DIAGNOSIS — I5021 Acute systolic (congestive) heart failure: Secondary | ICD-10-CM | POA: Diagnosis not present

## 2016-10-02 DIAGNOSIS — I10 Essential (primary) hypertension: Secondary | ICD-10-CM

## 2016-10-02 DIAGNOSIS — I255 Ischemic cardiomyopathy: Secondary | ICD-10-CM

## 2016-10-02 LAB — BASIC METABOLIC PANEL
BUN: 16 mg/dL (ref 7–25)
CHLORIDE: 106 mmol/L (ref 98–110)
CO2: 28 mmol/L (ref 20–31)
Calcium: 9.5 mg/dL (ref 8.6–10.3)
Creat: 1.54 mg/dL — ABNORMAL HIGH (ref 0.70–1.18)
Glucose, Bld: 119 mg/dL — ABNORMAL HIGH (ref 65–99)
POTASSIUM: 4 mmol/L (ref 3.5–5.3)
SODIUM: 143 mmol/L (ref 135–146)

## 2016-10-02 MED ORDER — CLONIDINE HCL 0.1 MG PO TABS: 0.2000 mg | ORAL_TABLET | Freq: Once | ORAL | Status: DC

## 2016-10-02 MED ORDER — ROSUVASTATIN CALCIUM 40 MG PO TABS: 40.0000 mg | ORAL_TABLET | Freq: Every day | ORAL | 3 refills | Status: DC

## 2016-10-02 MED ORDER — CARVEDILOL 12.5 MG PO TABS: 12.5000 mg | ORAL_TABLET | Freq: Two times a day (BID) | ORAL | 11 refills | Status: DC

## 2016-10-02 MED ORDER — SPIRONOLACTONE 25 MG PO TABS: 12.5000 mg | ORAL_TABLET | Freq: Every day | ORAL | 11 refills | Status: DC

## 2016-10-02 MED ORDER — SACUBITRIL-VALSARTAN 97-103 MG PO TABS: 1.0000 | ORAL_TABLET | Freq: Two times a day (BID) | ORAL | 11 refills | Status: DC

## 2016-10-02 MED ORDER — CARVEDILOL 6.25 MG PO TABS: 6.2500 mg | ORAL_TABLET | Freq: Two times a day (BID) | ORAL | 11 refills | Status: DC

## 2016-10-02 NOTE — Patient Instructions (Addendum)
INCREASE Entresto to 97-103mg  twice a day. Use your home supply, then pick up refill from pharmacy.  INCREASE carvedilol to 6.25mg  twice a day. Prescription sent to your pharmacy.  INCREASE rosuvastatin to 40mg  daily. Prescription sent to your pharmacy.  We will call with lab results and will plan to restart low dose of spironolactone 12.5mg  daily if your labs are stable.  Follow up in blood pressure clinic in 2 weeks.

## 2016-10-09 ENCOUNTER — Other Ambulatory Visit: Payer: Medicare Other | Admitting: *Deleted

## 2016-10-09 DIAGNOSIS — I10 Essential (primary) hypertension: Secondary | ICD-10-CM | POA: Diagnosis not present

## 2016-10-09 LAB — BASIC METABOLIC PANEL
BUN: 13 mg/dL (ref 7–25)
CHLORIDE: 107 mmol/L (ref 98–110)
CO2: 31 mmol/L (ref 20–31)
CREATININE: 1.66 mg/dL — AB (ref 0.70–1.18)
Calcium: 9 mg/dL (ref 8.6–10.3)
Glucose, Bld: 139 mg/dL — ABNORMAL HIGH (ref 65–99)
POTASSIUM: 3.8 mmol/L (ref 3.5–5.3)
SODIUM: 143 mmol/L (ref 135–146)

## 2016-10-14 ENCOUNTER — Encounter: Payer: Self-pay | Admitting: Physician Assistant

## 2016-10-14 ENCOUNTER — Telehealth: Payer: Self-pay | Admitting: Physician Assistant

## 2016-10-14 NOTE — Telephone Encounter (Signed)
Called patient in response to email with concerns about his BP.  The patient reports his BP consistantly 180's/low 100's., occasionally higher.  His wife mentions he has been with a tooth ache for a couple weeks and thinks this may be attributing to this (will see the dentist next week).  The patient denies the toothache as anything significant and "very tolerable".   They are instructed to increase his Carvedilol to 12.5mg  BID and keep his appointment Thursday with M. Supple, RPH to re-evaluate as well.   They were informed he does not require dental prophylaxis.  Tommye Standard, PA-C

## 2016-10-16 ENCOUNTER — Ambulatory Visit (INDEPENDENT_AMBULATORY_CARE_PROVIDER_SITE_OTHER): Payer: Medicare Other | Admitting: Pharmacist

## 2016-10-16 VITALS — BP 155/90 | HR 61

## 2016-10-16 DIAGNOSIS — I10 Essential (primary) hypertension: Secondary | ICD-10-CM

## 2016-10-16 DIAGNOSIS — I255 Ischemic cardiomyopathy: Secondary | ICD-10-CM | POA: Diagnosis not present

## 2016-10-16 MED ORDER — SPIRONOLACTONE 25 MG PO TABS: 25.0000 mg | ORAL_TABLET | Freq: Every day | ORAL | 11 refills | Status: DC

## 2016-10-16 NOTE — Patient Instructions (Addendum)
Go back to carvedilol 6.25mg  twice a day (1 tablet twice a day)  Increase spironolactone to 25mg  daily (1 tablet daily)  Continue all other medications.  Cut back on Pepsi to 2 servings a day.  Follow up in 1 week on Wednesday December 20th at 1pm. We will check labwork then too.

## 2016-10-16 NOTE — Progress Notes (Signed)
Patient ID: ARMONDO DEBEVEC                 DOB: 12-Feb-1941                      MRN: [id number]     HPI: Connor Bolotin Cooperis a 75 y.o.malepatient of Dr. Tommie Raymond by Tommye Standard, PA to HTN clinic who presents today for follow up.PMH ofCAD/CABG, ICM with ICD, chronic CHF (EF 35-40% 04/12/16), HTN, HLD, CKD III, PAFib, PVD with vascular non-obstructive carotid disease, orthostatic changes, chronic back pain and arthritic pain. Patient has trouble understanding medication-related instructions. He self- discontinued almost all of his HF medications this summer because he was afraid of hypotension and had not told his doctors this. BP as a result became elevated with systolic readings > A999333. He was given clonidine at his last HTN visit because of this. Have been working with pt to slowly reintroduce and optimize his HF medications. At last visit, increased Entresto to 97-103mg  BID, carvedilol to 6.25mg  BID, and started spironolactone 12.5mg  daily. F/u BMET was stable with K 3.8. Pt then called clinic with concerns of BP remaining high 180/100s due to tooth pain and Renee Ursuy increase carvedilol to 12.5mg  BID. Pt presents today for further med titration.  Pt brings his wife to clinic with him today - she helps him with his medications. Pt reports that he felt loopy and drowsy for the past 2 days and that he was staggering around. This coincides with higher dose of carvedilol that pt took for the past 2 days. Because he was feeling bad, he did not take any of his medications this morning and reports that he feels better. Pt drinks 3 cans of regular Pepsi each day. His home readings have ranged from 140/90s - 170s/110s. Pt's home cuff measures 15 points higher on systolic and diastolic readings in clinic today. Pt has had cuff for only 2 weeks and is using it correctly. Afib is likely causing discrepancies in BP readings.   Current HTN meds: carvedilol 6.25 mg BID, Entresto 97-103 mg BID, spironolactone  12.5mg  daily  Previously tried: Quinapril 20 mg and 40 mg daily, candesartan 32 mg daily, valsartan 320 mg daily, spironolactone 25 mg daily, amlodipine 5 mg daily, hydralazine 10 mg daily, carvedilol 25mg  BID, Entresto 97-103mg  BID, Imdur  BP goal: <130/80 mmHg (new HTN guidelines)  Family History: The patient's family history includes MIin his brother, father, and mother; Heart disease in his brother, father, and mother  Social History: Reports that he quit smoking about 17 years ago. He has never used smokeless tobacco. He reports that he does not drink alcohol or use drugs.  Diet: Eats very little breakfast, eats mostly supper consisting of steak, vegetables, spaghetti from [place]. Adds salt to food in the morning. Doesn't drink coffee, but drinks 3 cans of pepsi.   Exercise: goes fishing when it's warm  Home BP readings: 200s/90s mmHg over the last 2 weeks with a brand new bicep BP cuff  Wt Readings from Last 3 Encounters:  09/15/16 170 lb (77.1 kg)  07/14/16 173 lb 8 oz (78.7 kg)  07/08/16 170 lb (77.1 kg)   BP Readings from Last 3 Encounters:  10/02/16 (!) 205/127  09/22/16 (!) 180/100  09/15/16 (!) 160/100   Pulse Readings from Last 3 Encounters:  10/02/16 68  09/22/16 63  09/15/16 80    Renal function: CrCl cannot be calculated (Unknown ideal weight.).  Past Medical History:  Diagnosis  Date  . Abnormality of gait 02/07/2015  . Atrial fibrillation ([place])   . Dyspnea   . ICD (implantable cardiac defibrillator)  BSx   . Ischemic cardiomyopathy   . Myocardial infarction   . Near syncope 09/21/2014  . Peripheral vascular disease [facility])     Current Outpatient Prescriptions on File Prior to Visit  Medication Sig Dispense Refill  . aspirin 81 MG tablet Take 81 mg by mouth daily.     Marland Kitchen buPROPion (WELLBUTRIN XL) 300 MG 24 hr tablet Take 300 mg by mouth at bedtime. Reported on 03/24/2016    . carvedilol (COREG) 6.25 MG tablet Take 1 tablet (6.25 mg total) by mouth 2  (two) times daily. 60 tablet 11  . clopidogrel (PLAVIX) 75 MG tablet Take 75 mg by mouth at bedtime.   3  . colchicine 0.6 MG tablet     . cyclobenzaprine (FLEXERIL) 5 MG tablet     . escitalopram (LEXAPRO) 10 MG tablet Take 10 mg by mouth daily.     . famotidine (PEPCID) 20 MG tablet Take 20 mg by mouth 2 (two) times daily.     Marland Kitchen gabapentin (NEURONTIN) 300 MG capsule Take 300-600 mg by mouth at bedtime. May need 600 mg in the evening at night    . nitroGLYCERIN (NITROSTAT) 0.4 MG SL tablet Place 0.4 mg under the tongue every 5 (five) minutes as needed for chest pain.    Marland Kitchen oxyCODONE-acetaminophen (PERCOCET) 10-325 MG tablet Take 1 tablet by mouth 2 (two) times daily as needed for pain.     . rosuvastatin (CRESTOR) 40 MG tablet Take 1 tablet (40 mg total) by mouth at bedtime. Reported on 05/19/2016 90 tablet 3  . sacubitril-valsartan (ENTRESTO) 97-103 MG Take 1 tablet by mouth 2 (two) times daily. 60 tablet 11  . spironolactone (ALDACTONE) 25 MG tablet Take 0.5 tablets (12.5 mg total) by mouth daily. 15 tablet 11   Current Facility-Administered Medications on File Prior to Visit  Medication Dose Route Frequency Provider Last Rate Last Dose  . cloNIDine (CATAPRES) tablet 0.2 mg  0.2 mg Oral Once Deboraha Sprang, MD        Allergies  Allergen Reactions  . Amlodipine     Pt not sure if this is an allergy  . Tramadol Other (See Comments)    Unknown      Assessment/Plan:  1. Hypertension/HFrEF medication optimization - BP much improved to 155/90 with recent med changes, despite pt not taking any of his medications this AM. Home readings are also overestimating BP by 15 points, likely due to pt's afib. Pt felt dizzy and "staggered around" on higher dose of carvedilol 12.5mg  BID that he took for 2 days. Advised him to go back to 6.25mg  BID dosing which he previously tolerated well. Have maxed pt out on Entresto dosing. Will increase spironolactone to 25mg  daily. Pt will work to cut back on Pepsi  intake. Will recheck BMET and BP in 1 week.    Rashid Whitenight E. Rayjon Wery, PharmD, CPP, [place] [id number] [address], [place], [place] [address] Phone: [phone]; Fax: [phone] 10/16/2016 1:42 PM

## 2016-10-20 ENCOUNTER — Telehealth: Payer: Self-pay | Admitting: *Deleted

## 2016-10-20 DIAGNOSIS — E785 Hyperlipidemia, unspecified: Secondary | ICD-10-CM | POA: Diagnosis not present

## 2016-10-20 DIAGNOSIS — Z79899 Other long term (current) drug therapy: Secondary | ICD-10-CM | POA: Diagnosis not present

## 2016-10-20 DIAGNOSIS — R7301 Impaired fasting glucose: Secondary | ICD-10-CM | POA: Diagnosis not present

## 2016-10-20 DIAGNOSIS — I10 Essential (primary) hypertension: Secondary | ICD-10-CM | POA: Diagnosis not present

## 2016-10-20 DIAGNOSIS — D649 Anemia, unspecified: Secondary | ICD-10-CM | POA: Diagnosis not present

## 2016-10-20 DIAGNOSIS — M109 Gout, unspecified: Secondary | ICD-10-CM | POA: Diagnosis not present

## 2016-10-20 DIAGNOSIS — I2510 Atherosclerotic heart disease of native coronary artery without angina pectoris: Secondary | ICD-10-CM | POA: Diagnosis not present

## 2016-10-20 NOTE — Telephone Encounter (Signed)
-----   Message from [facility], [place] sent at 10/13/2016  1:31 PM EST ----- Please let the patient know his lab looks stable, remind him to keep his appointment Thursday.  Thanks [employer]

## 2016-10-20 NOTE — Telephone Encounter (Signed)
SPOKE TO PT ABOUT RESULTS AND VERBALIZED Connor Wilkerson

## 2016-10-21 NOTE — Progress Notes (Deleted)
Patient ID: Connor Wilkerson                 DOB: 1940/12/29                      MRN: [id number]     HPI: Connor Wilkerson a 75 y.o.malepatient of Dr. Tommie Raymond by Tommye Standard, PA to HTN clinic who presents today for follow up.PMH ofCAD/CABG, ICM with ICD, chronic CHF (EF 35-40% 04/12/16), HTN, HLD, CKD III, PAFib, PVD with vascular non-obstructive carotid disease, orthostatic changes, chronic back pain and arthritic pain. Patient has trouble understanding medication-related instructions. He self-discontinued almost all of his HF medications this summer because he was afraid of hypotension and had not told his doctors this. BP as a result became elevated with systolic readings > A999333. Have been working with pt to slowly reintroduce and optimize his HF medications. At last visit, decreased carvedilol back to 6.25mg  BID for tolerability and increased spironolactone to 25mg  daily. Pt presents today for BP check and BMET on higher dose of spironolactone.  Pt brings his wife to clinic with him today - she helps him with his medications. Pt reports that he felt loopy and drowsy for the past 2 days and that he was staggering around. This coincides with higher dose of carvedilol that pt took for the past 2 days. Because he was feeling bad, he did not take any of his medications this morning and reports that he feels better. Pt drinks 3 cans of regular Pepsi each day. His home readings have ranged from 140/90s - 170s/110s. Pt's home cuff measures 15 points higher on systolic and diastolic readings in clinic today. Pt has had cuff for only 2 weeks and is using it correctly. Afib is likely causing discrepancies in BP readings.   Current HTN meds:carvedilol 6.25 mg BID, Entresto 97-103 mg BID, spironolactone 25mg  daily  Previously tried:Quinapril 20 mg and 40 mg daily, candesartan 32 mg daily, valsartan 320 mg daily, spironolactone 25 mg daily, amlodipine 5 mg daily, hydralazine 10 mg daily, carvedilol  25mg  BID, Entresto 97-103mg  BID, Imdur  BP goal: <130/80 mmHg (new HTN guidelines)  Family History: The patient's family history includes MIin his brother, father, and mother; Heart disease in his brother, father, and mother  Social History: Reports that he quit smoking about 17 years ago. He has never used smokeless tobacco. He reports that he does not drink alcohol or use drugs.  Diet:Eats very little breakfast, eats mostly supper consisting of steak, vegetables, spaghetti from [place]. Addssalt to food in the morning. Doesn't drink coffee, but drinks3 cans of pepsi.   Exercise:goes fishing when it's warm  Home BP readings:200s/90s mmHg over the last 2 weeks with a brand new bicep BP cuff  Wt Readings from Last 3 Encounters:  09/15/16 170 lb (77.1 kg)  07/14/16 173 lb 8 oz (78.7 kg)  07/08/16 170 lb (77.1 kg)   BP Readings from Last 3 Encounters:  10/16/16 (!) 155/90  10/02/16 (!) 205/127  09/22/16 (!) 180/100   Pulse Readings from Last 3 Encounters:  10/16/16 61  10/02/16 68  09/22/16 63    Renal function: CrCl cannot be calculated (Unknown ideal weight.).  Past Medical History:  Diagnosis Date  . Abnormality of gait 02/07/2015  . Atrial fibrillation ([place])   . Dyspnea   . ICD (implantable cardiac defibrillator)  BSx   . Ischemic cardiomyopathy   . Myocardial infarction   . Near syncope 09/21/2014  . Peripheral  vascular disease [facility])     Current Outpatient Prescriptions on File Prior to Visit  Medication Sig Dispense Refill  . aspirin 81 MG tablet Take 81 mg by mouth daily.     Marland Kitchen buPROPion (WELLBUTRIN XL) 300 MG 24 hr tablet Take 300 mg by mouth at bedtime. Reported on 03/24/2016    . carvedilol (COREG) 6.25 MG tablet Take 1 tablet (6.25 mg total) by mouth 2 (two) times daily. 60 tablet 11  . clopidogrel (PLAVIX) 75 MG tablet Take 75 mg by mouth at bedtime.   3  . colchicine 0.6 MG tablet     . cyclobenzaprine (FLEXERIL) 5 MG tablet     . escitalopram  (LEXAPRO) 10 MG tablet Take 10 mg by mouth daily.     . famotidine (PEPCID) 20 MG tablet Take 20 mg by mouth 2 (two) times daily.     Marland Kitchen gabapentin (NEURONTIN) 300 MG capsule Take 300-600 mg by mouth at bedtime. May need 600 mg in the evening at night    . nitroGLYCERIN (NITROSTAT) 0.4 MG SL tablet Place 0.4 mg under the tongue every 5 (five) minutes as needed for chest pain.    Marland Kitchen oxyCODONE-acetaminophen (PERCOCET) 10-325 MG tablet Take 1 tablet by mouth 2 (two) times daily as needed for pain.     . rosuvastatin (CRESTOR) 40 MG tablet Take 1 tablet (40 mg total) by mouth at bedtime. Reported on 05/19/2016 90 tablet 3  . sacubitril-valsartan (ENTRESTO) 97-103 MG Take 1 tablet by mouth 2 (two) times daily. 60 tablet 11  . spironolactone (ALDACTONE) 25 MG tablet Take 1 tablet (25 mg total) by mouth daily. 30 tablet 11   Current Facility-Administered Medications on File Prior to Visit  Medication Dose Route Frequency Provider Last Rate Last Dose  . cloNIDine (CATAPRES) tablet 0.2 mg  0.2 mg Oral Once Deboraha Sprang, MD        Allergies  Allergen Reactions  . Amlodipine     Pt not sure if this is an allergy  . Tramadol Other (See Comments)    Unknown      Assessment/Plan:

## 2016-10-22 ENCOUNTER — Other Ambulatory Visit: Payer: Medicare Other

## 2016-10-22 ENCOUNTER — Ambulatory Visit: Payer: Medicare Other

## 2016-10-22 NOTE — Progress Notes (Signed)
Patient ID: Connor Wilkerson                 DOB: 11/06/1940                      MRN: [id number]     HPI: Dontay Burdell Cooperis a 75 y.o.malepatient of Dr. Tommie Raymond by Tommye Standard, PA to HTN clinic who presents today for follow up.PMH positive forCAD/CABG, ICM with ICD, chronic CHF (EF 35-40% 04/12/16), HTN, HLD, CKD III, PAFib, PVD with vascular non-obstructive carotid disease, orthostatic changes, chronic back pain and arthritic pain.  Have been working with pt to slowly reintroduce and optimize his HF medications.. At last visit spironolactone was increased to 25mg  daily. Next BMET will be done today to monitor renal function and potassium level.  Denies headaches, SOB or dizzies. No missed doses of any medication and no self adjustments reported by patient and wife.   Also reports decreased in Pepsi drinks to 2 cans each day but very difficult to stop taking "his Pepsi" every day.   Current HTN meds:carvedilol 6.25 mg BID, Entresto 97-103 mg BID, spironolactone 25mg  daily   Previously tried:Quinapril 20 mg and 40 mg daily, candesartan 32 mg daily, valsartan 320 mg daily, spironolactone 25 mg daily, amlodipine 5 mg daily, hydralazine 10 mg daily, carvedilol 25mg  BID, Entresto 97-103mg  BID, Imdur   BP goal: <130/80 mmHg  Family History: The patient's family history includes MIin his brother, father, and mother; Heart disease in his brother, father, and mother  Social History: Reports that he quit smoking about 17 years ago. He has never used smokeless tobacco. He reports that he does not drink alcohol or use drugs.  Diet:Eats 2 eggs every day for breakfast, eats mostly supper consisting of steak, vegetables, spaghetti from [place]. Addssalt to food in the morning. Doesn't drink coffee, but drinks3 cans of pepsi.   Exercise:goes fishing when it's warm  Wt Readings from Last 3 Encounters:  09/15/16 170 lb (77.1 kg)  07/14/16 173 lb 8 oz (78.7 kg)  07/08/16 170 lb (77.1  kg)   BP Readings from Last 3 Encounters:  10/16/16 (!) 155/90  10/02/16 (!) 205/127  09/22/16 (!) 180/100   Pulse Readings from Last 3 Encounters:  10/16/16 61  10/02/16 68  09/22/16 63    Past Medical History:  Diagnosis Date  . Abnormality of gait 02/07/2015  . Atrial fibrillation ([place])   . Dyspnea   . ICD (implantable cardiac defibrillator)  BSx   . Ischemic cardiomyopathy   . Myocardial infarction   . Near syncope 09/21/2014  . Peripheral vascular disease [facility])     Current Outpatient Prescriptions on File Prior to Visit  Medication Sig Dispense Refill  . aspirin 81 MG tablet Take 81 mg by mouth daily.     Marland Kitchen buPROPion (WELLBUTRIN XL) 300 MG 24 hr tablet Take 300 mg by mouth at bedtime. Reported on 03/24/2016    . carvedilol (COREG) 6.25 MG tablet Take 1 tablet (6.25 mg total) by mouth 2 (two) times daily. 60 tablet 11  . clopidogrel (PLAVIX) 75 MG tablet Take 75 mg by mouth at bedtime.   3  . colchicine 0.6 MG tablet     . cyclobenzaprine (FLEXERIL) 5 MG tablet     . escitalopram (LEXAPRO) 10 MG tablet Take 10 mg by mouth daily.     . famotidine (PEPCID) 20 MG tablet Take 20 mg by mouth 2 (two) times daily.     Marland Kitchen gabapentin (  NEURONTIN) 300 MG capsule Take 300-600 mg by mouth at bedtime. May need 600 mg in the evening at night    . nitroGLYCERIN (NITROSTAT) 0.4 MG SL tablet Place 0.4 mg under the tongue every 5 (five) minutes as needed for chest pain.    Marland Kitchen oxyCODONE-acetaminophen (PERCOCET) 10-325 MG tablet Take 1 tablet by mouth 2 (two) times daily as needed for pain.     . rosuvastatin (CRESTOR) 40 MG tablet Take 1 tablet (40 mg total) by mouth at bedtime. Reported on 05/19/2016 90 tablet 3  . sacubitril-valsartan (ENTRESTO) 97-103 MG Take 1 tablet by mouth 2 (two) times daily. 60 tablet 11  . spironolactone (ALDACTONE) 25 MG tablet Take 1 tablet (25 mg total) by mouth daily. 30 tablet 11   Current Facility-Administered Medications on File Prior to Visit  Medication Dose  Route Frequency Provider Last Rate Last Dose  . cloNIDine (CATAPRES) tablet 0.2 mg  0.2 mg Oral Once Deboraha Sprang, MD        Allergies  Allergen Reactions  . Amlodipine     Pt not sure if this is an allergy  . Tramadol Other (See Comments)    Unknown      Assessment/Plan:  1. Hypertension/HFrEF medication optimization -  BP remains elevated at 172/100 during office visit. Patient and wife denied  any missing doses or self adjusting of medication.   Not ready to make significant life style changes at this time but willing to" try" to cut Pepsi daily intake.  Risk and symptoms of stroke discussed with patient and wife during visit.  Will increase spironolactone dose to 50mg  daily . ALL other antihypertensive medication to continue without changes.  Patient encouraged to call office is unable to tolerate changes; otherwise follow up in 2 weeks with HTN clinic; BMET will be completed on the same day.  Per patient's request, no new prescription for spironolactone sent to pharmacy yet until re-assessment.  Harris Kistler Rodriguez-Guzman PharmD, [place] [id number] 10/23/2016 12:36 PM

## 2016-10-23 ENCOUNTER — Ambulatory Visit (INDEPENDENT_AMBULATORY_CARE_PROVIDER_SITE_OTHER): Payer: Medicare Other | Admitting: Pharmacist

## 2016-10-23 ENCOUNTER — Other Ambulatory Visit: Payer: Medicare Other | Admitting: *Deleted

## 2016-10-23 VITALS — BP 172/100 | HR 76

## 2016-10-23 DIAGNOSIS — I255 Ischemic cardiomyopathy: Secondary | ICD-10-CM | POA: Diagnosis not present

## 2016-10-23 DIAGNOSIS — I10 Essential (primary) hypertension: Secondary | ICD-10-CM | POA: Diagnosis not present

## 2016-10-23 LAB — BASIC METABOLIC PANEL
BUN: 27 mg/dL — AB (ref 7–25)
CO2: 26 mmol/L (ref 20–31)
CREATININE: 2.02 mg/dL — AB (ref 0.70–1.18)
Calcium: 8.8 mg/dL (ref 8.6–10.3)
Chloride: 106 mmol/L (ref 98–110)
Glucose, Bld: 141 mg/dL — ABNORMAL HIGH (ref 65–99)
POTASSIUM: 4 mmol/L (ref 3.5–5.3)
Sodium: 139 mmol/L (ref 135–146)

## 2016-10-23 NOTE — Patient Instructions (Addendum)
Blood pressure today was 172/100 pulse 76  Increase spironolactone to 50mg  daily (take 2 tablets of 25mg  home supply)  Continue all other medications.  Cut back on Pepsi to 1-2 serving per day  Follow up in 2 week on January/5th. Please for blood work prior to appointment.   Hypertension Hypertension is another name for high blood pressure. High blood pressure forces your heart to work harder to pump blood. A blood pressure reading has two numbers, which includes a higher number over a lower number (example: 110/72). Follow these instructions at home:  Have your blood pressure rechecked by your doctor.  Only take medicine as told by your doctor. Follow the directions carefully. The medicine does not work as well if you skip doses. Skipping doses also puts you at risk for problems.  Do not smoke.  Monitor your blood pressure at home as told by your doctor. Contact a doctor if:  You think you are having a reaction to the medicine you are taking.  You have repeat headaches or feel dizzy.  You have puffiness (swelling) in your ankles.  You have trouble with your vision. Get help right away if:  You get a very bad headache and are confused.  You feel weak, numb, or faint.  You get chest or belly (abdominal) pain.  You throw up (vomit).  You cannot breathe very well. This information is not intended to replace advice given to you by your health care provider. Make sure you discuss any questions you have with your health care provider. Document Released: 04/07/2008 Document Revised: 03/27/2016 Document Reviewed: 08/12/2013 Elsevier Interactive Patient Education  2017 [employer].

## 2016-10-24 ENCOUNTER — Telehealth: Payer: Self-pay | Admitting: Pharmacist

## 2016-10-24 MED ORDER — AMLODIPINE BESYLATE 5 MG PO TABS: 5.0000 mg | ORAL_TABLET | Freq: Every day | ORAL | 11 refills | Status: DC

## 2016-10-24 NOTE — Telephone Encounter (Signed)
Spoke with pt's wife and advised pt to cut back to spironolactone 12.5mg  daily and start amlodipine 5mg  daily given elevated BP at visit yesterday but increasing SCr. She verbalized understanding. Pt out of town until 12/27 and will pick up amlodipine when they return. Pt will keep follow up appt on 1/5.

## 2016-10-28 ENCOUNTER — Encounter: Payer: Self-pay | Admitting: Pharmacist

## 2016-10-28 NOTE — Telephone Encounter (Signed)
This encounter was created in error - please disregard.

## 2016-10-28 NOTE — Telephone Encounter (Deleted)
-----   Message from [facility] [place] sent at 10/28/2016  1:19 PM EST ----- Yes, that sounds good, at 2.5mg  to start I think is best.  Thanks again Joseph Art ----- Message ----- From: Leeroy Bock, [place] Sent: 10/24/2016   3:26 PM To: Baldwin Jamaica, PA-C  Our new pharmacist Raquel saw him in clinic yesterday. She had increased his spironolactone dose prior to BMET results. Would you like me to call him and have him reduce spironolactone to 12.5mg  and start amlodipine?  ----- Message ----- From: Baldwin Jamaica, PA-C Sent: 10/24/2016   1:40 PM To: Harlon Flor Supple, RPH  Sorry, I missed that.  It is tough, I think the amlodipine was stopped with the hypotensive episode that he was in the ER for, maybe try 2.5mg   He has some carotid disease so our goal would be not perfect BP to avoid hypotensive episodes.  I will staff message Dr. Caryl Comes to take a look at the notes and see if he has some ideas.  Thanks so much   ----- Message ----- From: Leeroy Bock, RPH Sent: 10/24/2016   1:35 PM To: Baldwin Jamaica, PA-C  Check out my last note - he didn't feel well on the 12.5mg  dose of coreg (dizziness/spinning) and cut his dose back after taking a few. He felt better on the 6.25 and did not want to increase his dose again. My thoughts would be to back off on spironolactone over Entresto - his SCr was a bit lower on max dose Entresto prior to spironolactone initiation. His BP has still been very elevated though, any other thoughts?   ----- Message ----- From: Baldwin Jamaica, PA-C Sent: 10/24/2016   1:25 PM To: Leeroy Bock, RPH  Megan,  I notice his renal function, I had spoken to the wife about increasing his coreg to 12.5mg  BID prior to your last visit, though still shows 6.125.  I am concerned about the trend of his renal function.  Perhaps we maximize his coreg and might need to back off the Entresto or aldactone.  Your thoughts?  Tommye Standard, PA-C

## 2016-11-07 ENCOUNTER — Other Ambulatory Visit: Payer: Medicare Other | Admitting: *Deleted

## 2016-11-07 ENCOUNTER — Ambulatory Visit (INDEPENDENT_AMBULATORY_CARE_PROVIDER_SITE_OTHER): Payer: Medicare Other | Admitting: Pharmacist

## 2016-11-07 VITALS — BP 138/84 | HR 67

## 2016-11-07 DIAGNOSIS — I10 Essential (primary) hypertension: Secondary | ICD-10-CM

## 2016-11-07 NOTE — Addendum Note (Signed)
Addended by: Eulis Foster on: 11/07/2016 02:21 PM   Modules accepted: Orders

## 2016-11-07 NOTE — Addendum Note (Signed)
Addended by: Eulis Foster on: 11/07/2016 02:20 PM   Modules accepted: Orders

## 2016-11-07 NOTE — Patient Instructions (Signed)
Your blood pressure was excellent today.  Continue your current medications.  We'll call you with lab results on Monday - we may need to change your spironolactone dose depending on your kidney function.

## 2016-11-07 NOTE — Progress Notes (Signed)
Patient ID: GODFREY MCCREERY                 DOB: [birth year]                      MRN: [id number]     HPI: Satya Heady Cooperis a 76 y.o.malepatient of Dr. Tommie Raymond by Tommye Standard, PA to HTN clinic who presents today for follow up.PMH ofCAD/CABG, ICM with ICD, chronic CHF (EF 35-40% 04/12/16), HTN, HLD, CKD III, PAFib, PVD with vascular non-obstructive carotid disease, orthostatic changes, chronic back pain and arthritic pain. Patient has trouble understanding medication-related instructions. He self-discontinued almost all of his HF medications this summer because he was afraid of hypotension and had not told his doctors this. BP as a result became elevated with systolic readings > [id number]. Have been working with pt to slowly reintroduce and optimize his HF medications. Unable to titrate carvedilol further due to patient complaints of fatigue and altered mental status (loopy and staggering around per pt). At last visit, spironolactone cut back to 12.5mg  at last visit due to increasing SCr and amlodipine 5mg  was started.  Pt did pick up amlodipine and has been taking it correctly. However, he did not cut back on his spironolactone dose as directed at last visit due to SCr increase to 2. He is still feeling a little drowsy, otherwise doing well. Wife states he has been a little more depressed lately. Home BP readings much improved: 122/76, 136/77, 121/83.  Current HTN meds:carvedilol 6.25mg  BID, Entresto 97-103mg  BID, spironolactone 25mg  daily, amlodipine 5mg  daily Previously tried:carvedilol 12.5mg  - pt experienced fatigue and altered mental status, quinapril 20 mg and 40 mg daily, candesartan 32 mg daily, valsartan 320 mg daily, spironolactone 25 mg daily, amlodipine 5 mg daily, hydralazine 10 mg daily, carvedilol 25mg  BID, Entresto 97-103mg  BID, Imdur  BP goal: <130/80 mmHg (new HTN guidelines)  Family History: The patient's family history includes MIin his brother, father, and mother; Heart  disease in his brother, father, and mother  Social History: Reports that he quit smoking about 17 years ago. He has never used smokeless tobacco. He reports that he does not drink alcohol or use drugs.  Diet:Eats very little breakfast, eats mostly supper consisting of steak, vegetables, spaghetti from [place]. Addssalt to food in the morning. Doesn't drink coffee, but drinks3 cans of pepsi.   Exercise:goes fishing when it's warm  Home BP readings:200s/90s mmHg over the last 2 weeks with a brand new bicep BP cuff  Wt Readings from Last 3 Encounters:  09/15/16 170 lb (77.1 kg)  07/14/16 173 lb 8 oz (78.7 kg)  07/08/16 170 lb (77.1 kg)   BP Readings from Last 3 Encounters:  10/23/16 (!) 172/100  10/16/16 (!) 155/90  10/02/16 (!) 205/127   Pulse Readings from Last 3 Encounters:  10/23/16 76  10/16/16 61  10/02/16 68    Renal function: CrCl cannot be calculated (Unknown ideal weight.).  Past Medical History:  Diagnosis Date  . Abnormality of gait 02/07/2015  . Atrial fibrillation ([place])   . Dyspnea   . ICD (implantable cardiac defibrillator)  BSx   . Ischemic cardiomyopathy   . Myocardial infarction   . Near syncope 09/21/2014  . Peripheral vascular disease [facility])     Current Outpatient Prescriptions on File Prior to Visit  Medication Sig Dispense Refill  . amLODipine (NORVASC) 5 MG tablet Take 1 tablet (5 mg total) by mouth daily. 30 tablet 11  . aspirin 81 MG tablet  Take 81 mg by mouth daily.     Marland Kitchen buPROPion (WELLBUTRIN XL) 300 MG 24 hr tablet Take 300 mg by mouth at bedtime. Reported on 03/24/2016    . carvedilol (COREG) 6.25 MG tablet Take 1 tablet (6.25 mg total) by mouth 2 (two) times daily. 60 tablet 11  . clopidogrel (PLAVIX) 75 MG tablet Take 75 mg by mouth at bedtime.   3  . colchicine 0.6 MG tablet     . cyclobenzaprine (FLEXERIL) 5 MG tablet     . escitalopram (LEXAPRO) 10 MG tablet Take 10 mg by mouth daily.     . famotidine (PEPCID) 20 MG tablet Take  20 mg by mouth 2 (two) times daily.     Marland Kitchen gabapentin (NEURONTIN) 300 MG capsule Take 300-600 mg by mouth at bedtime. May need 600 mg in the evening at night    . nitroGLYCERIN (NITROSTAT) 0.4 MG SL tablet Place 0.4 mg under the tongue every 5 (five) minutes as needed for chest pain.    Marland Kitchen oxyCODONE-acetaminophen (PERCOCET) 10-325 MG tablet Take 1 tablet by mouth 2 (two) times daily as needed for pain.     . rosuvastatin (CRESTOR) 40 MG tablet Take 1 tablet (40 mg total) by mouth at bedtime. Reported on 05/19/2016 90 tablet 3  . sacubitril-valsartan (ENTRESTO) 97-103 MG Take 1 tablet by mouth 2 (two) times daily. 60 tablet 11  . spironolactone (ALDACTONE) 25 MG tablet Take 0.5 tablets (12.5 mg total) by mouth daily. 30 tablet 11   Current Facility-Administered Medications on File Prior to Visit  Medication Dose Route Frequency Provider Last Rate Last Dose  . cloNIDine (CATAPRES) tablet 0.2 mg  0.2 mg Oral Once Deboraha Sprang, MD        Allergies  Allergen Reactions  . Amlodipine     Pt not sure if this is an allergy  . Tramadol Other (See Comments)    Unknown      Assessment/Plan:  1. Hypertension - BP now at goal and pt receiving optimal HF medications. Unable to titrate beta blocker further due to pt symptoms of fatigue and altered mental status. Titrated Entresto to max dose. Pt was supposed to cut back on spironolactone dose to 12.5mg  daily after recent SCr increase, however pt did not do this. Rechecking BMET today and will f/u with pt regarding appropriate spironolactone dosing based on today's SCr.   Megan E. Supple, PharmD, CPP, [place] [id number], [place], [place] [address] Phone: [phone]; Fax: [phone] 11/07/2016 2:39 PM

## 2016-11-08 LAB — BASIC METABOLIC PANEL
BUN/Creatinine Ratio: 15 (ref 10–24)
BUN: 24 mg/dL (ref 8–27)
CALCIUM: 9.3 mg/dL (ref 8.6–10.2)
CO2: 22 mmol/L (ref 18–29)
Chloride: 105 mmol/L (ref 96–106)
Creatinine, Ser: 1.65 mg/dL — ABNORMAL HIGH (ref 0.76–1.27)
GFR calc Af Amer: 46 mL/min/{1.73_m2} — ABNORMAL LOW (ref >59)
GFR calc non Af Amer: 40 mL/min/{1.73_m2} — ABNORMAL LOW (ref >59)
GLUCOSE: 69 mg/dL (ref 65–99)
POTASSIUM: 4.6 mmol/L (ref 3.5–5.2)
SODIUM: 142 mmol/L (ref 134–144)

## 2016-11-10 ENCOUNTER — Telehealth: Payer: Self-pay | Admitting: Pharmacist

## 2016-11-10 NOTE — Telephone Encounter (Signed)
SCr improved back to baseline although pt had not decreased spironolactone dose as advised after last office visit. Since BPs are controlled, pt is optimized on HF regimen, and SCr has returned to baseline, will continue current therapy. Pt and his wife are aware of results.

## 2016-11-24 DIAGNOSIS — C44729 Squamous cell carcinoma of skin of left lower limb, including hip: Secondary | ICD-10-CM | POA: Diagnosis not present

## 2016-11-24 DIAGNOSIS — L821 Other seborrheic keratosis: Secondary | ICD-10-CM | POA: Diagnosis not present

## 2016-11-24 DIAGNOSIS — L570 Actinic keratosis: Secondary | ICD-10-CM | POA: Diagnosis not present

## 2016-11-24 DIAGNOSIS — L578 Other skin changes due to chronic exposure to nonionizing radiation: Secondary | ICD-10-CM | POA: Diagnosis not present

## 2016-11-25 ENCOUNTER — Encounter: Payer: Self-pay | Admitting: Surgery

## 2016-12-01 ENCOUNTER — Ambulatory Visit (INDEPENDENT_AMBULATORY_CARE_PROVIDER_SITE_OTHER): Payer: Medicare Other | Admitting: Surgery

## 2016-12-01 ENCOUNTER — Ambulatory Visit (HOSPITAL_COMMUNITY)
Admission: RE | Admit: 2016-12-01 | Discharge: 2016-12-01 | Disposition: A | Discharge status: Home / Self Care | Payer: Medicare Other | Source: Ambulatory Visit | Attending: Surgery | Admitting: Surgery

## 2016-12-01 ENCOUNTER — Encounter: Payer: Self-pay | Admitting: Surgery

## 2016-12-01 VITALS — BP 121/75 | HR 67 | Temp 96.9°F | Resp 16 | Ht 70.0 in | Wt 169.0 lb

## 2016-12-01 DIAGNOSIS — I6523 Occlusion and stenosis of bilateral carotid arteries: Secondary | ICD-10-CM

## 2016-12-01 LAB — VAS US CAROTID
LCCAPDIAS: 20 cm/s
LCCAPSYS: 71 cm/s
LEFT ECA DIAS: -8 cm/s
LEFT VERTEBRAL DIAS: 17 cm/s
LICADSYS: -109 cm/s
Left CCA dist dias: 18 cm/s
Left CCA dist sys: 74 cm/s
Left ICA dist dias: -30 cm/s
RCCAPSYS: 90 cm/s
RIGHT CCA MID DIAS: 14 cm/s
RIGHT ECA DIAS: 20 cm/s
RIGHT VERTEBRAL DIAS: 16 cm/s
Right CCA prox dias: 14 cm/s
Right cca dist sys: -79 cm/s

## 2016-12-01 NOTE — Progress Notes (Signed)
[facility] [place]  Patient name: Connor Wilkerson MRN: [id number] DOB: [birth year] Sex: male   REASON FOR VISIT:    Follow up carotid stenosis  HISOTRY OF PRESENT ILLNESS:   This is a 76 year old gentleman who I initially saw in 2015 for syncope as well as carotid stenosis.  At that time he had had multiple falls as well as gait instability.  It was ultimately determined that his symptoms were related to hypotension, as indicated by medication manipulation and resolution of his symptoms.  He did have an episode of dysarthria and garbled speech during this timeframe.  He does not have any permanent deficits.  While he was going through this, I had attempted to get additional imaging.  He had gone into renal failure and therefore a CT scan was not possible.  He cannot get an MRI because of his pacemaker.  He is back today for follow-up.  He reports that he has no neurologic symptoms.  He denies numbness or weakness in either extremity.  He denies slurred speech.  He denies amaurosis fugax.  He occasionally will get numbness in his left hand which she attributes to his elbow surgery.  He states that when his hand gets numb he can straighten the arm out and it results  He is a former smoker.  He is on a statin for hypercholesterolemia.  He takes multiple blood pressure medications.  He is on dual antiplatelet therapy  PAST MEDICAL HISTORY:   Past Medical History:  Diagnosis Date  . Abnormality of gait 02/07/2015  . Atrial fibrillation ([place])   . Dyspnea   . ICD (implantable cardiac defibrillator)  BSx   . Ischemic cardiomyopathy   . Myocardial infarction   . Near syncope 09/21/2014  . Peripheral vascular disease ([place])      FAMILY HISTORY:   Family History  Problem Relation Age of Onset  . Heart disease Mother   . Heart attack Mother   . Heart disease Father   . Heart attack Father   . Heart disease Brother   . Heart  attack Brother     SOCIAL HISTORY:   Social History  Substance Use Topics  . Smoking status: Former Smoker    Quit date: 09/19/1999  . Smokeless tobacco: Never Used     Comment: quit in 1998  . Alcohol use No     Comment: recovering alcoholic, quit [id number] yrs ago     ALLERGIES:   Allergies  Allergen Reactions  . Amlodipine     Pt not sure if this is an allergy  . Tramadol Other (See Comments)    Unknown      CURRENT MEDICATIONS:   Current Outpatient Prescriptions  Medication Sig Dispense Refill  . amLODipine (NORVASC) 5 MG tablet Take 1 tablet (5 mg total) by mouth daily. 30 tablet 11  . aspirin 81 MG tablet Take 81 mg by mouth daily.     Marland Kitchen buPROPion (WELLBUTRIN XL) 300 MG 24 hr tablet Take 300 mg by mouth at bedtime. Reported on 03/24/2016    . carvedilol (COREG) 6.25 MG tablet Take 1 tablet (6.25 mg total) by mouth 2 (two) times daily. 60 tablet 11  . clopidogrel (PLAVIX) 75 MG tablet Take 75 mg by mouth at bedtime.   3  . colchicine 0.6 MG tablet     . cyclobenzaprine (FLEXERIL) 5 MG tablet     . escitalopram (LEXAPRO) 10 MG tablet Take 10 mg by mouth daily.     Marland Kitchen  famotidine (PEPCID) 20 MG tablet Take 20 mg by mouth 2 (two) times daily.     Marland Kitchen gabapentin (NEURONTIN) 300 MG capsule Take 300 mg by mouth at bedtime. May need 600 mg in the evening at night    . nitroGLYCERIN (NITROSTAT) 0.4 MG SL tablet Place 0.4 mg under the tongue every 5 (five) minutes as needed for chest pain.    Marland Kitchen oxyCODONE-acetaminophen (PERCOCET) 10-325 MG tablet Take 1 tablet by mouth 2 (two) times daily as needed for pain.     . rosuvastatin (CRESTOR) 40 MG tablet Take 1 tablet (40 mg total) by mouth at bedtime. Reported on 05/19/2016 90 tablet 3  . sacubitril-valsartan (ENTRESTO) 97-103 MG Take 1 tablet by mouth 2 (two) times daily. 60 tablet 11  . spironolactone (ALDACTONE) 25 MG tablet Take 25 mg by mouth daily.  30 tablet 11   Current Facility-Administered Medications  Medication Dose Route  Frequency Provider Last Rate Last Dose  . cloNIDine (CATAPRES) tablet 0.2 mg  0.2 mg Oral Once Deboraha Sprang, MD        REVIEW OF SYSTEMS:   [X]  denotes positive finding, [ ]  denotes negative finding Cardiac  Comments:  Chest pain or chest pressure:    Shortness of breath upon exertion: x   Short of breath when lying flat:    Irregular heart rhythm: x       Vascular    Pain in calf, thigh, or hip brought on by ambulation: x   Pain in feet at night that wakes you up from your sleep:  x   Blood clot in your veins:    Leg swelling:         Pulmonary    Oxygen at home:    Productive cough:     Wheezing:         Neurologic    Sudden weakness in arms or legs:  x   Sudden numbness in arms or legs:  x   Sudden onset of difficulty speaking or slurred speech: x   Temporary loss of vision in one eye:     Problems with dizziness:  x       Gastrointestinal    Blood in stool:     Vomited blood:         Genitourinary    Burning when urinating:     Blood in urine:        Psychiatric    Major depression:         Hematologic    Bleeding problems:    Problems with blood clotting too easily:        Skin    Rashes or ulcers:        Constitutional    Fever or chills:      PHYSICAL EXAM:   Vitals:   12/01/16 0959 12/01/16 1002  BP: 114/73 121/75  Pulse: 67   Resp: 16   Temp: (!) 96.9 F (36.1 C)   TempSrc: Oral   SpO2: 99%   Weight: 169 lb (76.7 kg)   Height: 5\' 10"  (1.778 m)     GENERAL: The patient is a well-nourished male, in no acute distress. The vital signs are documented above. CARDIAC: There is a regular rate and rhythm.  VASCULAR: No carotid bruits. PULMONARY: Non-labored respirations ABDOMEN: Soft and non-tender with normal pitched bowel sounds.  MUSCULOSKELETAL: There are no major deformities or cyanosis. NEUROLOGIC: No focal weakness or paresthesias are detected. SKIN: There are no ulcers or rashes noted.  PSYCHIATRIC: The patient has a normal  affect.  STUDIES:   Carotid duplex was ordered and reviewed today.  He has 60-79% right carotid stenosis and 40-59 percent left carotid stenosis  MEDICAL ISSUES:   Asymptomatic bilateral carotid stenosis, right greater than left: I discussed with the patient and his wife that I would recommend elective repair, as long as he remains asymptomatic, when the stenosis is greater than 80%.  Should he develop symptoms, he would require sooner repair.  We went over the signs and symptoms of a TIA and stroke and to go to the hospital should they occur.  I would like to get extra imaging if the patient requires surgery as his bifurcation was felt to be high on ultrasound.    Annamarie Major, MD [facility] [phone] Pager [phone]

## 2016-12-02 DIAGNOSIS — C44729 Squamous cell carcinoma of skin of left lower limb, including hip: Secondary | ICD-10-CM | POA: Diagnosis not present

## 2016-12-03 NOTE — Addendum Note (Signed)
Addended by: Lianne Cure A on: 12/03/2016 04:48 PM   Modules accepted: Orders

## 2016-12-07 IMAGING — CT CT HEAD W/O CM
3 of 4 series · 17 of 47 positions shown, 20 images · non-contrast
Comparison: 08/18/2014

CLINICAL DATA: Dizziness, sinus headache for 2 weeks

EXAM:
CT HEAD WITHOUT CONTRAST
TECHNIQUE: Contiguous axial images were obtained from the base of the skull
through the vertex without intravenous contrast.

[Series 201: head w/o, idose (1) · axial · non-contrast · 0.49mm/px · z∈[+109,+234]mm · 11 of 31 slices shown, 14 images]
[im 3/31  brain]
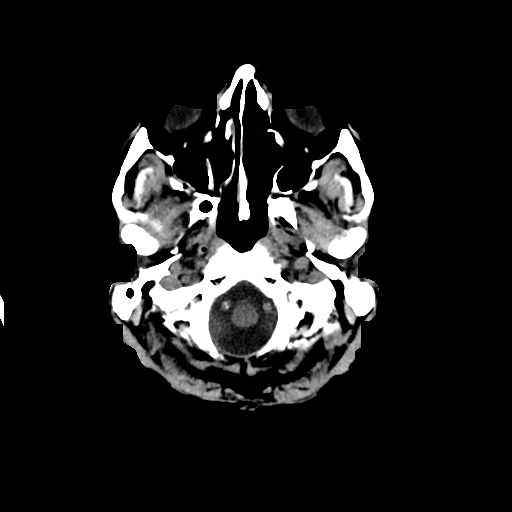
[im 3/31  bone]
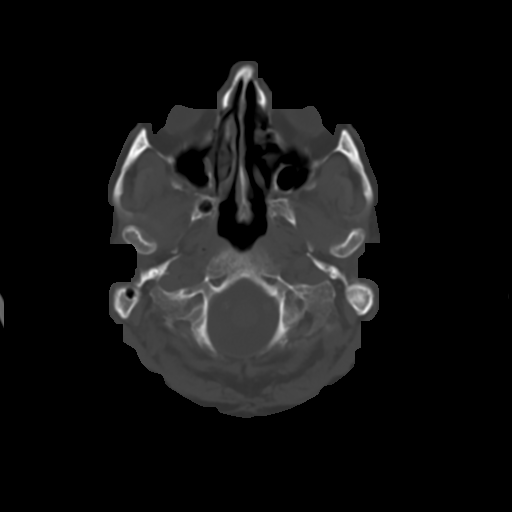
[im 5/31  brain]
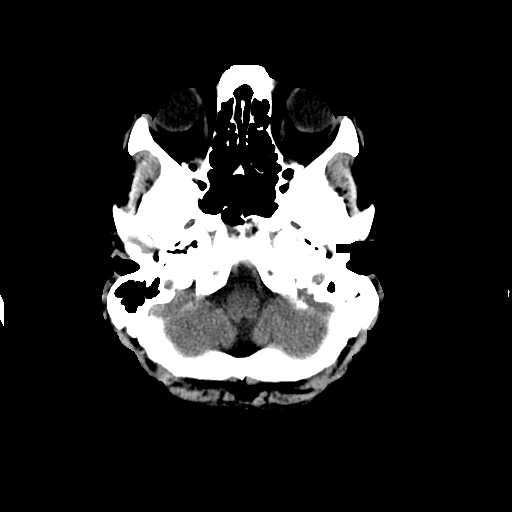
[im 7/31  brain]
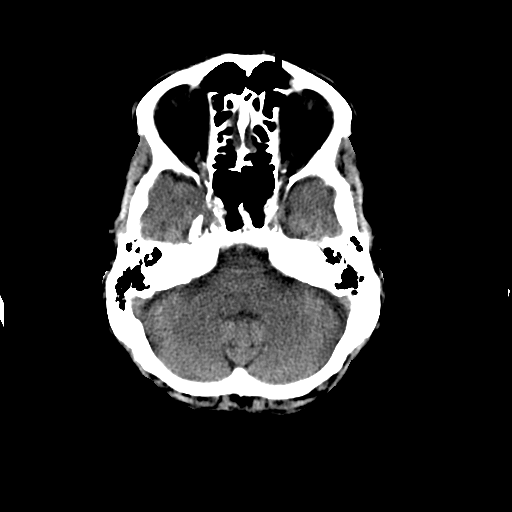
[im 11/31  brain]
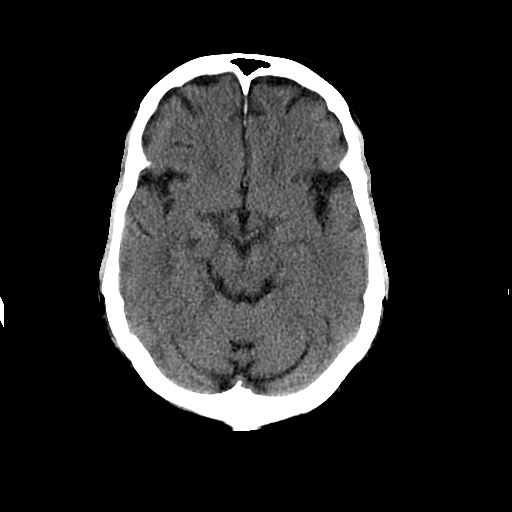
[im 13/31  brain]
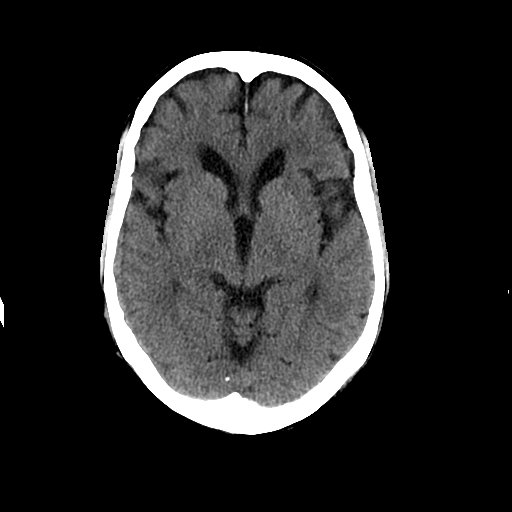
[im 13/31  bone]
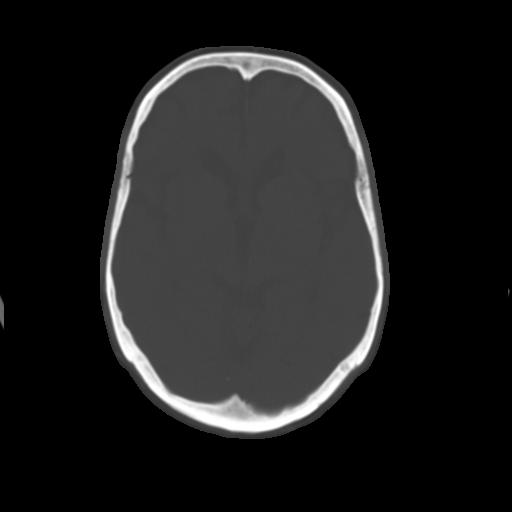
[im 16/31  brain]
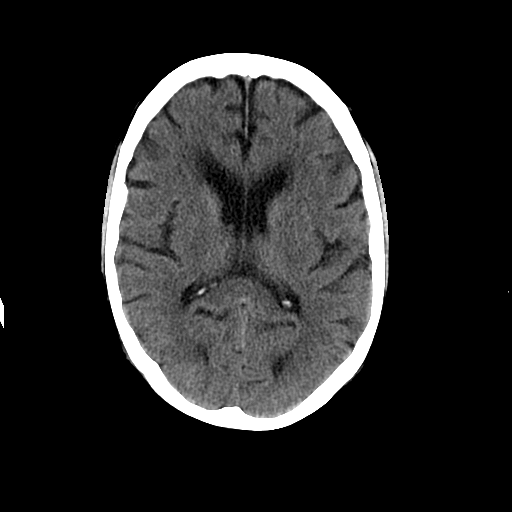
[im 18/31  brain]
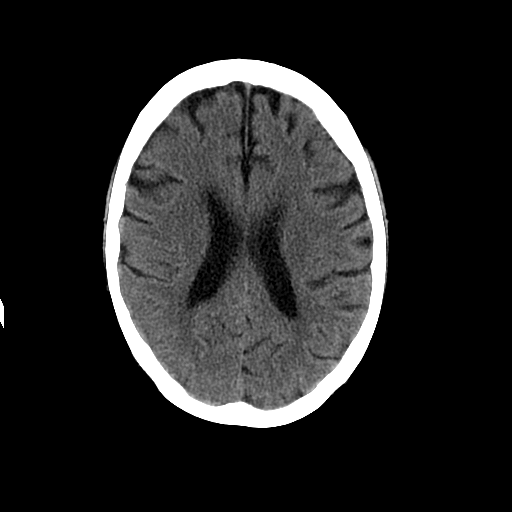
[im 20/31  brain]
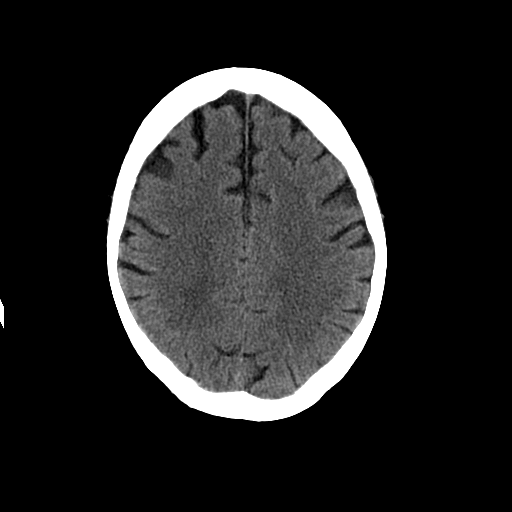
[im 24/31  brain]
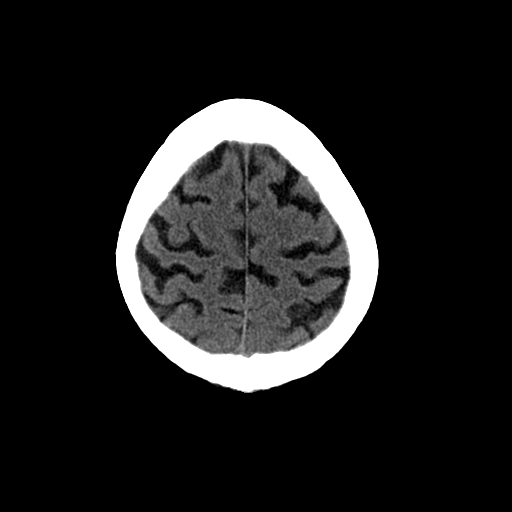
[im 24/31  bone]
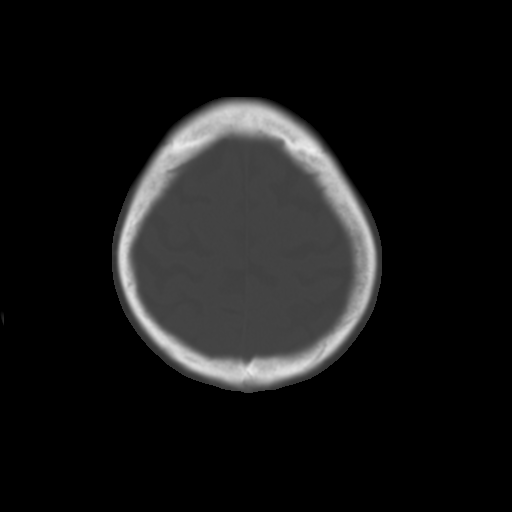
[im 26/31  brain]
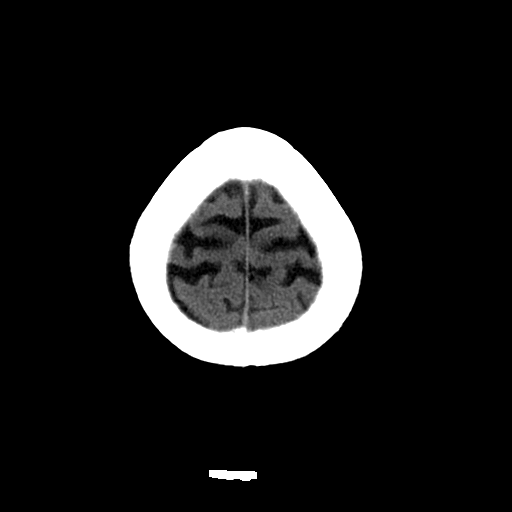
[im 28/31  brain]
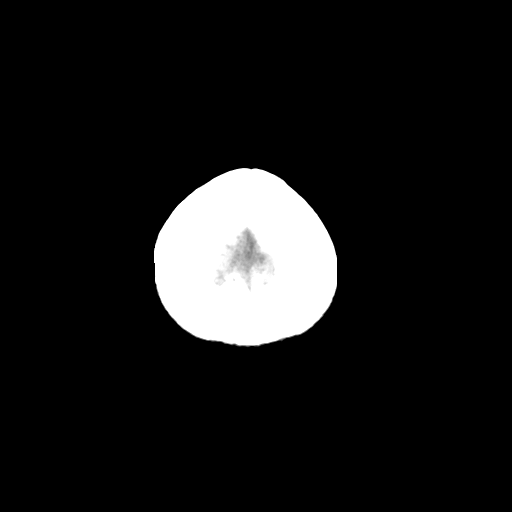

[Series 203: coronal st, idose (1) · coronal · 0.40mm/px · 3 of 76 slices shown]
[im 26/76  brain]
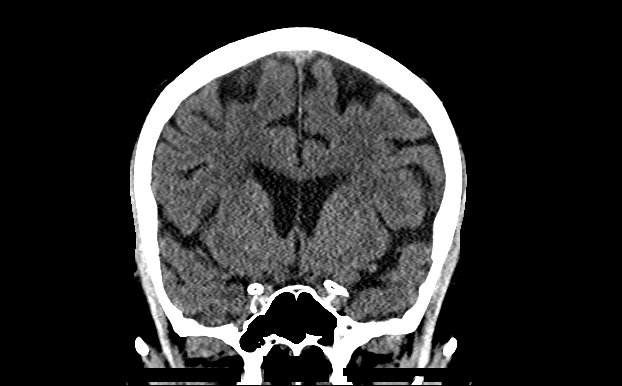
[im 34/76  brain]
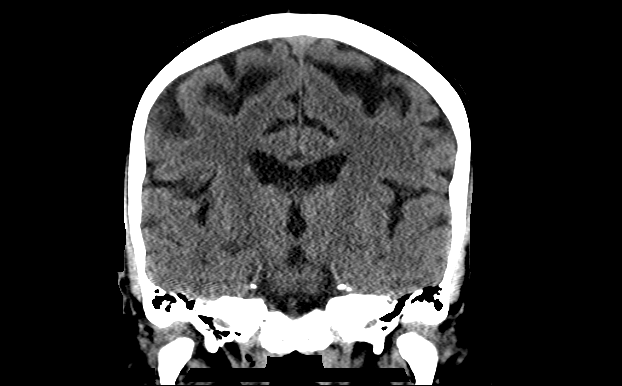
[im 42/76  brain]
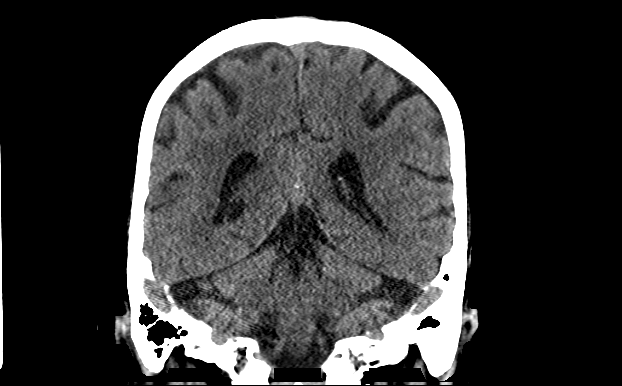

[Series 204: sagittal st, idose (1) · sagittal · 0.40mm/px · 3 of 83 slices shown]
[im 28/83  brain]
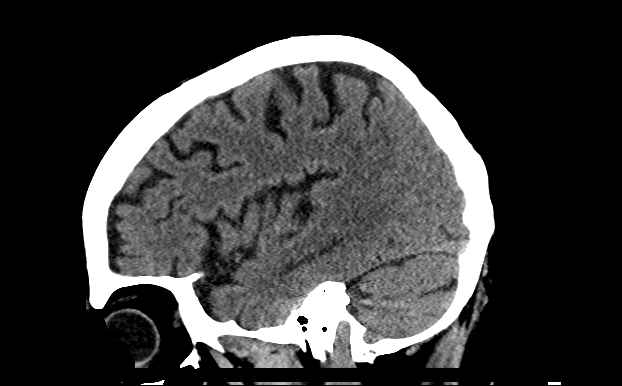
[im 42/83  brain]
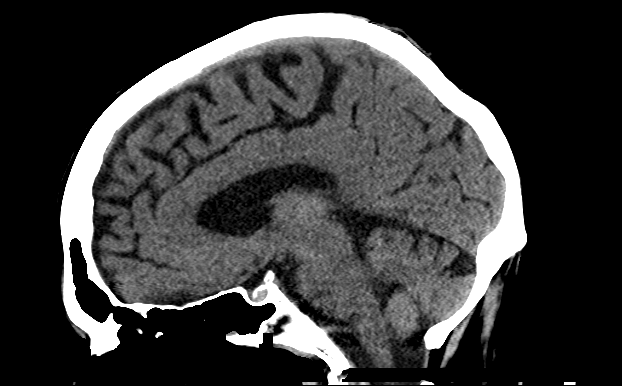
[im 55/83  brain]
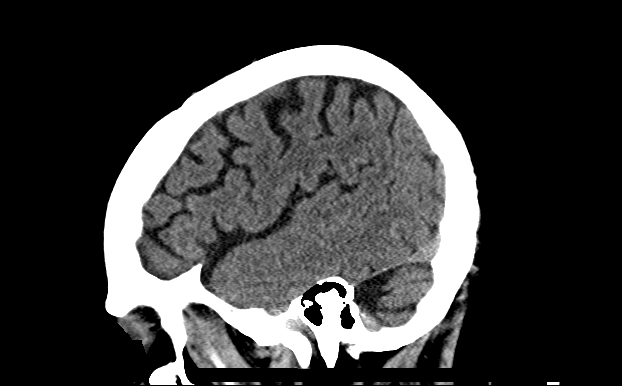

[17 of 47 positions shown; findings below may reference images not displayed]

FINDINGS: Brain: No intracranial hemorrhage, mass effect or midline shift. No
acute cortical infarction. No mass lesion is noted on this
unenhanced scan. Stable atrophy. Stable periventricular and patchy
subcortical chronic white matter disease.

Vascular: Atherosclerotic calcifications of carotid siphon.

Skull: Negative for fracture or focal lesion.

Sinuses/Orbits: No acute findings.

Other: None.
IMPRESSION: 1. No acute intracranial abnormality. Stable atrophy and chronic
white matter disease. No definite acute cortical infarction.

## 2016-12-15 ENCOUNTER — Ambulatory Visit (INDEPENDENT_AMBULATORY_CARE_PROVIDER_SITE_OTHER): Payer: Medicare Other | Admitting: *Deleted

## 2016-12-15 ENCOUNTER — Telehealth: Payer: Self-pay | Admitting: Cardiology

## 2016-12-15 DIAGNOSIS — I255 Ischemic cardiomyopathy: Secondary | ICD-10-CM | POA: Diagnosis not present

## 2016-12-15 NOTE — Telephone Encounter (Signed)
Spoke with pt and reminded pt of remote transmission that is due today. Pt verbalized understanding.   

## 2016-12-15 NOTE — Progress Notes (Signed)
Remote ICD transmission.   

## 2016-12-16 ENCOUNTER — Encounter: Payer: Self-pay | Admitting: Cardiology

## 2016-12-18 LAB — CUP PACEART REMOTE DEVICE CHECK
Battery Remaining Longevity: 102 mo
Brady Statistic RV Percent Paced: 0 %
Date Time Interrogation Session: 20180212170600
HIGH POWER IMPEDANCE MEASURED VALUE: 49 Ohm
Implantable Lead Location: 753860
Implantable Lead Serial Number: 159477
Implantable Pulse Generator Implant Date: 20150123
Lead Channel Pacing Threshold Pulse Width: 0.4 ms
Lead Channel Pacing Threshold Pulse Width: 0.4 ms
Lead Channel Setting Pacing Amplitude: 2 V
Lead Channel Setting Pacing Amplitude: 2.4 V
Lead Channel Setting Pacing Pulse Width: 0.4 ms
Lead Channel Setting Sensing Sensitivity: 0.6 mV
MDC IDC LEAD IMPLANT DT: 20060512
MDC IDC LEAD IMPLANT DT: 20060512
MDC IDC LEAD LOCATION: 753859
MDC IDC MSMT BATTERY REMAINING PERCENTAGE: 100 %
MDC IDC MSMT LEADCHNL RA IMPEDANCE VALUE: 419 Ohm
MDC IDC MSMT LEADCHNL RA PACING THRESHOLD AMPLITUDE: 0.6 V
MDC IDC MSMT LEADCHNL RV IMPEDANCE VALUE: 559 Ohm
MDC IDC MSMT LEADCHNL RV PACING THRESHOLD AMPLITUDE: 0.8 V
MDC IDC PG SERIAL: 112776
MDC IDC STAT BRADY RA PERCENT PACED: 41 %

## 2017-02-05 ENCOUNTER — Telehealth: Payer: Self-pay | Admitting: *Deleted

## 2017-02-05 NOTE — Telephone Encounter (Signed)
ENTRESTO approved through covermymeds

## 2017-02-05 NOTE — Telephone Encounter (Signed)
PA to Covermymeds for Entresto tablets

## 2017-02-18 DIAGNOSIS — I10 Essential (primary) hypertension: Secondary | ICD-10-CM | POA: Diagnosis not present

## 2017-02-18 DIAGNOSIS — M1990 Unspecified osteoarthritis, unspecified site: Secondary | ICD-10-CM | POA: Diagnosis not present

## 2017-02-18 DIAGNOSIS — E785 Hyperlipidemia, unspecified: Secondary | ICD-10-CM | POA: Diagnosis not present

## 2017-02-18 DIAGNOSIS — R7301 Impaired fasting glucose: Secondary | ICD-10-CM | POA: Diagnosis not present

## 2017-02-18 DIAGNOSIS — I2510 Atherosclerotic heart disease of native coronary artery without angina pectoris: Secondary | ICD-10-CM | POA: Diagnosis not present

## 2017-02-19 DIAGNOSIS — R7301 Impaired fasting glucose: Secondary | ICD-10-CM | POA: Diagnosis not present

## 2017-02-19 DIAGNOSIS — Z79899 Other long term (current) drug therapy: Secondary | ICD-10-CM | POA: Diagnosis not present

## 2017-02-19 DIAGNOSIS — I10 Essential (primary) hypertension: Secondary | ICD-10-CM | POA: Diagnosis not present

## 2017-02-19 DIAGNOSIS — E785 Hyperlipidemia, unspecified: Secondary | ICD-10-CM | POA: Diagnosis not present

## 2017-02-19 DIAGNOSIS — D649 Anemia, unspecified: Secondary | ICD-10-CM | POA: Diagnosis not present

## 2017-05-20 DIAGNOSIS — M1990 Unspecified osteoarthritis, unspecified site: Secondary | ICD-10-CM | POA: Diagnosis not present

## 2017-05-20 DIAGNOSIS — I2510 Atherosclerotic heart disease of native coronary artery without angina pectoris: Secondary | ICD-10-CM | POA: Diagnosis not present

## 2017-05-20 DIAGNOSIS — Z79899 Other long term (current) drug therapy: Secondary | ICD-10-CM | POA: Diagnosis not present

## 2017-05-20 DIAGNOSIS — R202 Paresthesia of skin: Secondary | ICD-10-CM | POA: Diagnosis not present

## 2017-05-20 DIAGNOSIS — I10 Essential (primary) hypertension: Secondary | ICD-10-CM | POA: Diagnosis not present

## 2017-05-20 DIAGNOSIS — E785 Hyperlipidemia, unspecified: Secondary | ICD-10-CM | POA: Diagnosis not present

## 2017-05-22 ENCOUNTER — Encounter: Payer: Self-pay | Admitting: Surgery

## 2017-05-25 ENCOUNTER — Encounter: Payer: Self-pay | Admitting: Surgery

## 2017-05-27 DIAGNOSIS — L570 Actinic keratosis: Secondary | ICD-10-CM | POA: Diagnosis not present

## 2017-05-27 DIAGNOSIS — C44729 Squamous cell carcinoma of skin of left lower limb, including hip: Secondary | ICD-10-CM | POA: Diagnosis not present

## 2017-06-01 ENCOUNTER — Ambulatory Visit (HOSPITAL_COMMUNITY)
Admission: RE | Admit: 2017-06-01 | Discharge: 2017-06-01 | Disposition: A | Discharge status: Home / Self Care | Payer: Medicare Other | Source: Ambulatory Visit | Attending: Surgery | Admitting: Surgery

## 2017-06-01 ENCOUNTER — Encounter: Payer: Self-pay | Admitting: Surgery

## 2017-06-01 ENCOUNTER — Ambulatory Visit (INDEPENDENT_AMBULATORY_CARE_PROVIDER_SITE_OTHER): Payer: Medicare Other | Admitting: Surgery

## 2017-06-01 VITALS — BP 159/104 | HR 67 | Temp 97.3°F | Resp 16 | Ht 70.0 in | Wt 167.0 lb

## 2017-06-01 DIAGNOSIS — I6523 Occlusion and stenosis of bilateral carotid arteries: Secondary | ICD-10-CM

## 2017-06-01 DIAGNOSIS — I255 Ischemic cardiomyopathy: Secondary | ICD-10-CM | POA: Diagnosis not present

## 2017-06-01 LAB — VAS US CAROTID
LCCADDIAS: -13 cm/s
LCCADSYS: -67 cm/s
LCCAPDIAS: 15 cm/s
LEFT ECA DIAS: -11 cm/s
LEFT VERTEBRAL DIAS: -18 cm/s
LICADDIAS: -27 cm/s
Left CCA prox sys: 74 cm/s
Left ICA dist sys: -94 cm/s
RCCAPSYS: 54 cm/s
RIGHT CCA MID DIAS: 13 cm/s
RIGHT ECA DIAS: -9 cm/s
RIGHT VERTEBRAL DIAS: -18 cm/s
Right CCA prox dias: 10 cm/s
Right cca dist sys: -79 cm/s

## 2017-06-01 NOTE — Progress Notes (Signed)
[facility] [place]  Patient name: Connor Wilkerson MRN: [id number] DOB: 1941/01/26 Sex: male   REASON FOR VISIT:    Follow up  [place]:    This is a 76 year old gentleman who I initially saw in 2015 for syncope as well as carotid stenosis.  At that time he had had multiple falls as well as gait instability.  It was ultimately determined that his symptoms were related to hypotension, as indicated by medication manipulation and resolution of his symptoms.  He did have an episode of dysarthria and garbled speech during this timeframe.  He does not have any permanent deficits.  While he was going through this, I had attempted to get additional imaging.  He had gone into renal failure and therefore a CT scan was not possible.  He cannot get an MRI because of his pacemaker.   After correction of the patient's hypotension, his symptoms have resolved.  I have been following him for a moderate carotid stenosis.  He reports no neurologic symptoms since I last saw him.   PAST MEDICAL HISTORY:   Past Medical History:  Diagnosis Date  . Abnormality of gait 02/07/2015  . Atrial fibrillation ([place])   . Dyspnea   . ICD (implantable cardiac defibrillator)  BSx   . Ischemic cardiomyopathy   . Myocardial infarction ([place])   . Near syncope 09/21/2014  . Peripheral vascular disease ([place])      FAMILY HISTORY:   Family History  Problem Relation Age of Onset  . Heart disease Mother   . Heart attack Mother   . Heart disease Father   . Heart attack Father   . Heart disease Brother   . Heart attack Brother     SOCIAL HISTORY:   Social History  Substance Use Topics  . Smoking status: Former Smoker    Quit date: 09/19/1999  . Smokeless tobacco: Never Used     Comment: quit in 1998  . Alcohol use No     Comment: recovering alcoholic, quit 84+ yrs ago     ALLERGIES:   Allergies  Allergen Reactions  . Amlodipine      Pt not sure if this is an allergy  . Tramadol Other (See Comments)    Unknown      CURRENT MEDICATIONS:   Current Outpatient Prescriptions  Medication Sig Dispense Refill  . aspirin 81 MG tablet Take 81 mg by mouth daily.     Marland Kitchen buPROPion (WELLBUTRIN XL) 300 MG 24 hr tablet Take 300 mg by mouth at bedtime. Reported on 03/24/2016    . carvedilol (COREG) 6.25 MG tablet Take 1 tablet (6.25 mg total) by mouth 2 (two) times daily. 60 tablet 11  . clopidogrel (PLAVIX) 75 MG tablet Take 75 mg by mouth at bedtime.   3  . colchicine 0.6 MG tablet     . cyclobenzaprine (FLEXERIL) 5 MG tablet     . escitalopram (LEXAPRO) 10 MG tablet Take 10 mg by mouth daily.     . famotidine (PEPCID) 20 MG tablet Take 20 mg by mouth 2 (two) times daily.     Marland Kitchen gabapentin (NEURONTIN) 300 MG capsule Take 300 mg by mouth at bedtime. May need 600 mg in the evening at night    . nitroGLYCERIN (NITROSTAT) 0.4 MG SL tablet Place 0.4 mg under the tongue every 5 (five) minutes as needed for chest pain.    . rosuvastatin (CRESTOR) 40 MG tablet Take 1 tablet (40 mg  total) by mouth at bedtime. Reported on 05/19/2016 90 tablet 3  . sacubitril-valsartan (ENTRESTO) 97-103 MG Take 1 tablet by mouth 2 (two) times daily. 60 tablet 11  . spironolactone (ALDACTONE) 25 MG tablet Take 25 mg by mouth daily.  30 tablet 11  . amLODipine (NORVASC) 5 MG tablet Take 1 tablet (5 mg total) by mouth daily. 30 tablet 11  . oxyCODONE-acetaminophen (PERCOCET) 10-325 MG tablet Take 1 tablet by mouth 2 (two) times daily as needed for pain.      Current Facility-Administered Medications  Medication Dose Route Frequency Provider Last Rate Last Dose  . cloNIDine (CATAPRES) tablet 0.2 mg  0.2 mg Oral Once Deboraha Sprang, MD        REVIEW OF SYSTEMS:   [X]  denotes positive finding, [ ]  denotes negative finding Cardiac  Comments:  Chest pain or chest pressure:    Shortness of breath upon exertion: x   Short of breath when lying flat:     Irregular heart rhythm:        Vascular    Pain in calf, thigh, or hip brought on by ambulation:    Pain in feet at night that wakes you up from your sleep:  x   Blood clot in your veins:    Leg swelling:         Pulmonary    Oxygen at home:    Productive cough:     Wheezing:         Neurologic    Sudden weakness in arms or legs:  x   Sudden numbness in arms or legs:     Sudden onset of difficulty speaking or slurred speech:    Temporary loss of vision in one eye:     Problems with dizziness:         Gastrointestinal    Blood in stool:     Vomited blood:         Genitourinary    Burning when urinating:     Blood in urine:        Psychiatric    Major depression:         Hematologic    Bleeding problems:    Problems with blood clotting too easily:        Skin    Rashes or ulcers:        Constitutional    Fever or chills:      PHYSICAL EXAM:   Vitals:   06/01/17 1300 06/01/17 1302 06/01/17 1304  BP: 133/90 (!) 166/102 (!) 159/104  Pulse: 69 67 67  Resp: 16    Temp: (!) 97.3 F (36.3 C)    SpO2: 96%    Weight: 167 lb (75.8 kg)    Height: 5\' 10"  (1.778 m)      GENERAL: The patient is a well-nourished male, in no acute distress. The vital signs are documented above. CARDIAC: There is a regular rate and rhythm.  VASCULAR: No carotid bruits.  I cannot palpate pedal pulses. PULMONARY: Non-labored respirations ABDOMEN: Soft and non-tender.  No pulsatile mass MUSCULOSKELETAL: There are no major deformities or cyanosis. NEUROLOGIC: No focal weakness or paresthesias are detected. SKIN: There are no ulcers or rashes noted. PSYCHIATRIC: The patient has a normal affect.  STUDIES:   I have ordered and reviewed his carotid duplex.  This shows 60-79% right carotid stenosis and 40-59 percent left carotid stenosis  MEDICAL ISSUES:   Asymptomatic carotid stenosis, right greater than left.  I discussed that we  would consider elective intervention once the stenosis  becomes greater than 80%.  I would also consider intervention should he develop any Neurologic symptoms that would be consistent with carotid stenosis.  The patient is now being placed on surveillance protocol.  He'll follow up in 6 months with a carotid duplex.    Annamarie Major, MD [facility] [phone] Pager [phone]

## 2017-06-09 NOTE — Addendum Note (Signed)
Addended by: Lianne Cure A on: 06/09/2017 04:17 PM   Modules accepted: Orders

## 2017-08-20 DIAGNOSIS — E785 Hyperlipidemia, unspecified: Secondary | ICD-10-CM | POA: Diagnosis not present

## 2017-08-20 DIAGNOSIS — G894 Chronic pain syndrome: Secondary | ICD-10-CM | POA: Diagnosis not present

## 2017-08-20 DIAGNOSIS — R7301 Impaired fasting glucose: Secondary | ICD-10-CM | POA: Diagnosis not present

## 2017-08-20 DIAGNOSIS — I2510 Atherosclerotic heart disease of native coronary artery without angina pectoris: Secondary | ICD-10-CM | POA: Diagnosis not present

## 2017-08-20 DIAGNOSIS — I10 Essential (primary) hypertension: Secondary | ICD-10-CM | POA: Diagnosis not present

## 2017-08-24 DIAGNOSIS — E785 Hyperlipidemia, unspecified: Secondary | ICD-10-CM | POA: Diagnosis not present

## 2017-08-24 DIAGNOSIS — D649 Anemia, unspecified: Secondary | ICD-10-CM | POA: Diagnosis not present

## 2017-08-24 DIAGNOSIS — I10 Essential (primary) hypertension: Secondary | ICD-10-CM | POA: Diagnosis not present

## 2017-08-24 DIAGNOSIS — Z79899 Other long term (current) drug therapy: Secondary | ICD-10-CM | POA: Diagnosis not present

## 2017-08-24 DIAGNOSIS — R7301 Impaired fasting glucose: Secondary | ICD-10-CM | POA: Diagnosis not present

## 2017-08-27 ENCOUNTER — Ambulatory Visit (INDEPENDENT_AMBULATORY_CARE_PROVIDER_SITE_OTHER): Payer: Medicare Other | Admitting: Internal Medicine

## 2017-08-27 VITALS — BP 118/80 | HR 74 | Ht 70.0 in | Wt 168.0 lb

## 2017-08-27 DIAGNOSIS — I493 Ventricular premature depolarization: Secondary | ICD-10-CM

## 2017-08-27 DIAGNOSIS — Z4502 Encounter for adjustment and management of automatic implantable cardiac defibrillator: Secondary | ICD-10-CM

## 2017-08-27 DIAGNOSIS — I255 Ischemic cardiomyopathy: Secondary | ICD-10-CM

## 2017-08-27 DIAGNOSIS — I2589 Other forms of chronic ischemic heart disease: Secondary | ICD-10-CM

## 2017-08-27 DIAGNOSIS — I5022 Chronic systolic (congestive) heart failure: Secondary | ICD-10-CM | POA: Diagnosis not present

## 2017-08-27 NOTE — Progress Notes (Signed)
Patient Care Team: Ernestene Kiel, MD as PCP - General (Internal Medicine) Deboraha Sprang, MD as Consulting Physician (Cardiology) Serafina Mitchell, MD as Consulting Physician (Vascular Surgery) Kathrynn Ducking, MD as Consulting Physician (Neurology)   HPI  Connor Wilkerson is a 76 y.o. male Seen following ICD generator replacement initally implanted 2006 for primary prevention in setting of ischemic cardiomyopathy.   subsequently  the device pocket moved to subpectorally.   He has hx of remote [place], CABG  1999  He underwent catheterization 2/17 following an abnormal Myoview scan raising the possibility of ischemia this having been done in the context of chest pain. Catheterization was done by Dr. [facility]; it demonstrated no interval change with patent grafts. Ejection fraction by Myoview scan 11/16 was 34%  The patient denies chest pain , nocturnal dyspnea, orthopnea or peripheral edema.  There have been no palpitations, lightheadedness or syncope. He has chronic shortness of breath            Past Medical History     Past Medical History:  Diagnosis Date  . Abnormality of gait 02/07/2015  . Atrial fibrillation ([place])   . Dyspnea   . ICD (implantable cardiac defibrillator)  BSx   . Ischemic cardiomyopathy   . Myocardial infarction ([place])   . Near syncope 09/21/2014  . Peripheral vascular disease [facility])     Past Surgical History:  Procedure Laterality Date  . CARDIAC CATHETERIZATION N/A 12/11/2015   Procedure: Left Heart Cath and Coronary Angiography;  Surgeon: Charolette Forward, MD;  Location: [place] CV LAB;  Service: Cardiovascular;  Laterality: N/A;  . CARDIAC DEFIBRILLATOR PLACEMENT    . CARDIAC DEFIBRILLATOR PLACEMENT  04/08/2016   NOT MRI SAFE (DOCUMENT SCANNED IN SYSTEM  . CORONARY ARTERY BYPASS GRAFT    . ELBOW SURGERY Left    plastic bone replacement  . HERNIA REPAIR     x2  . PERMANENT PACEMAKER GENERATOR CHANGE N/A 11/25/2013   Procedure: PERMANENT  PACEMAKER GENERATOR CHANGE;  Surgeon: Deboraha Sprang, MD;  Location[facility] CATH LAB;  Service: Cardiovascular;  Laterality: N/A;  . ROTATOR CUFF REPAIR Right 2006  . SINUS EXPLORATION  1987    Current Outpatient Prescriptions  Medication Sig Dispense Refill  . aspirin 81 MG tablet Take 81 mg by mouth daily.     Marland Kitchen buPROPion (WELLBUTRIN XL) 300 MG 24 hr tablet Take 300 mg by mouth at bedtime. Reported on 03/24/2016    . carvedilol (COREG) 6.25 MG tablet Take 1 tablet (6.25 mg total) by mouth 2 (two) times daily. 60 tablet 11  . clopidogrel (PLAVIX) 75 MG tablet Take 75 mg by mouth at bedtime.   3  . colchicine 0.6 MG tablet     . cyclobenzaprine (FLEXERIL) 5 MG tablet     . escitalopram (LEXAPRO) 10 MG tablet Take 10 mg by mouth daily.     . famotidine (PEPCID) 20 MG tablet Take 20 mg by mouth 2 (two) times daily.     Marland Kitchen gabapentin (NEURONTIN) 300 MG capsule Take 300 mg by mouth at bedtime. May need 600 mg in the evening at night    . nitroGLYCERIN (NITROSTAT) 0.4 MG SL tablet Place 0.4 mg under the tongue every 5 (five) minutes as needed for chest pain.    Marland Kitchen oxyCODONE-acetaminophen (PERCOCET) 10-325 MG tablet Take 1 tablet by mouth 2 (two) times daily as needed for pain.     . rosuvastatin (CRESTOR) 40 MG tablet Take 1  tablet (40 mg total) by mouth at bedtime. Reported on 05/19/2016 90 tablet 3  . sacubitril-valsartan (ENTRESTO) 97-103 MG Take 1 tablet by mouth 2 (two) times daily. 60 tablet 11  . spironolactone (ALDACTONE) 25 MG tablet Take 25 mg by mouth daily.  30 tablet 11  . amLODipine (NORVASC) 5 MG tablet Take 1 tablet (5 mg total) by mouth daily. 30 tablet 11   Current Facility-Administered Medications  Medication Dose Route Frequency Provider Last Rate Last Dose  . cloNIDine (CATAPRES) tablet 0.2 mg  0.2 mg Oral Once Deboraha Sprang, MD        Allergies  Allergen Reactions  . Amlodipine     Pt not sure if this is an allergy  . Tramadol Other (See Comments)    Unknown     Review  of Systems negative except from HPI and PMH  Physical Exam BP 118/80   Pulse 74   Ht 5\' 10"  (1.778 m)   Wt 168 lb (76.2 kg)   BMI 24.11 kg/m  Well developed and nourished in no acute distress HENT normal Neck supple with JVP-flat Clear Regular rate and rhythm, 2/6 m Abd-soft with active BS No Clubbing cyanosis edema Skin-warm and dry A & Oriented  Grossly normal sensory and motor function   ECG demonstrates atrial paced at 74 Intervals 18/10/42 PVCs-frequent right bundle superior axis  Assessment and  Plan\  Ischemic cardiomyopathy  Implantable defibrillator The patient's device was interrogated.  The information was reviewed. No changes were made in the programming.     Hypertension   PVC  Congestive heart failure-chronic-systolic  Euvolemic continue current meds  Without symptoms of ischemia  BP well controlled  PVC count has increased from 3% a year ago--8%. By ECG today it is more than that. His functional status is stable; I would be reluctant to do much in terms of this. He had blood work drawn by his PCP yesterday; we will request it from her. He needs surveillance for his high-risk medications.

## 2017-08-27 NOTE — Patient Instructions (Addendum)
Your physician recommends that you continue on your current medications as directed. Please refer to the Current Medication list given to you today.  Your physician wants you to follow-up in: 6 MONTHS WITH RENEE URSUY PA  You will receive a reminder letter in the mail two months in advance. If you don't receive a letter, please call our office to schedule the follow-up appointment.  

## 2017-09-04 LAB — CUP PACEART INCLINIC DEVICE CHECK
HIGH POWER IMPEDANCE MEASURED VALUE: 49 Ohm
Implantable Lead Implant Date: 20060512
Implantable Lead Location: 753859
Implantable Lead Model: 158
Implantable Lead Serial Number: 159477
Implantable Pulse Generator Implant Date: 20150123
Lead Channel Impedance Value: 619 Ohm
Lead Channel Pacing Threshold Amplitude: 0.8 V
Lead Channel Pacing Threshold Pulse Width: 0.4 ms
Lead Channel Sensing Intrinsic Amplitude: 9.1 mV
Lead Channel Setting Pacing Amplitude: 2.4 V
Lead Channel Setting Sensing Sensitivity: 0.6 mV
MDC IDC LEAD IMPLANT DT: 20060512
MDC IDC LEAD LOCATION: 753860
MDC IDC MSMT LEADCHNL RA IMPEDANCE VALUE: 413 Ohm
MDC IDC MSMT LEADCHNL RA PACING THRESHOLD AMPLITUDE: 0.6 V
MDC IDC MSMT LEADCHNL RA SENSING INTR AMPL: 2.2 mV
MDC IDC MSMT LEADCHNL RV PACING THRESHOLD PULSEWIDTH: 0.4 ms
MDC IDC PG SERIAL: 112776
MDC IDC SESS DTM: 20181025040000
MDC IDC SET LEADCHNL RA PACING AMPLITUDE: 2 V
MDC IDC SET LEADCHNL RV PACING PULSEWIDTH: 0.4 ms

## 2017-09-07 DIAGNOSIS — D649 Anemia, unspecified: Secondary | ICD-10-CM | POA: Diagnosis not present

## 2017-09-07 DIAGNOSIS — R748 Abnormal levels of other serum enzymes: Secondary | ICD-10-CM | POA: Diagnosis not present

## 2017-09-28 ENCOUNTER — Ambulatory Visit (INDEPENDENT_AMBULATORY_CARE_PROVIDER_SITE_OTHER): Payer: Self-pay | Admitting: *Deleted

## 2017-09-28 ENCOUNTER — Telehealth: Payer: Self-pay | Admitting: Cardiology

## 2017-09-28 DIAGNOSIS — I255 Ischemic cardiomyopathy: Secondary | ICD-10-CM

## 2017-09-28 NOTE — Telephone Encounter (Signed)
Spoke with pt and reminded pt of remote transmission that is due today. Pt verbalized understanding.   

## 2017-09-28 NOTE — Progress Notes (Signed)
Remote ICD transmission.   

## 2017-10-02 ENCOUNTER — Telehealth: Payer: Self-pay | Admitting: Internal Medicine

## 2017-10-02 ENCOUNTER — Encounter: Payer: Self-pay | Admitting: Cardiology

## 2017-10-02 MED ORDER — CLOPIDOGREL BISULFATE 75 MG PO TABS: 75.0000 mg | ORAL_TABLET | Freq: Every day | ORAL | 3 refills | Status: DC

## 2017-10-02 MED ORDER — NITROGLYCERIN 0.4 MG SL SUBL: 0.4000 mg | SUBLINGUAL_TABLET | SUBLINGUAL | 5 refills | Status: DC | PRN

## 2017-10-02 NOTE — Telephone Encounter (Signed)
Called pt and spoke with pt's wife informing her that Dr. Caryl Comes agreed to refill pt's plavix 75 mg tablet and that pt's medication has been sent to pt's pharmacy as requested and if she or the pt has any other problems, questions or concerns to call back. Pt's wife verbalized understanding.

## 2017-10-02 NOTE — Telephone Encounter (Signed)
New Message   *STAT* If patient is at the pharmacy, call can be transferred to refill team.   1. Which medications need to be refilled? (please list name of each medication and dose if known) Clopidogrel 75mg   2. Which pharmacy/location (including street and city if local pharmacy) is medication to be sent to? [facility] [place]   3. Do they need a 30 day or 90 day supply? 90  Per pt wife ask that all medication from previous cardiology be switch over to Dr. Caryl Comes. Please call back to discuss

## 2017-10-11 LAB — CUP PACEART REMOTE DEVICE CHECK
Battery Remaining Percentage: 100 %
Brady Statistic RA Percent Paced: 46 %
HighPow Impedance: 48 Ohm
Implantable Lead Location: 753860
Implantable Lead Model: 158
Implantable Lead Model: 5076
Implantable Pulse Generator Implant Date: 20150123
Lead Channel Impedance Value: 409 Ohm
Lead Channel Pacing Threshold Pulse Width: 0.4 ms
Lead Channel Setting Pacing Amplitude: 2 V
Lead Channel Setting Pacing Amplitude: 2.4 V
MDC IDC LEAD IMPLANT DT: 20060512
MDC IDC LEAD IMPLANT DT: 20060512
MDC IDC LEAD LOCATION: 753859
MDC IDC LEAD SERIAL: 159477
MDC IDC MSMT BATTERY REMAINING LONGEVITY: 96 mo
MDC IDC MSMT LEADCHNL RA PACING THRESHOLD AMPLITUDE: 0.6 V
MDC IDC MSMT LEADCHNL RA PACING THRESHOLD PULSEWIDTH: 0.4 ms
MDC IDC MSMT LEADCHNL RV IMPEDANCE VALUE: 562 Ohm
MDC IDC MSMT LEADCHNL RV PACING THRESHOLD AMPLITUDE: 0.8 V
MDC IDC PG SERIAL: 112776
MDC IDC SESS DTM: 20181126181200
MDC IDC SET LEADCHNL RV PACING PULSEWIDTH: 0.4 ms
MDC IDC SET LEADCHNL RV SENSING SENSITIVITY: 0.6 mV
MDC IDC STAT BRADY RV PERCENT PACED: 0 %

## 2017-11-04 ENCOUNTER — Telehealth: Payer: Self-pay | Admitting: Internal Medicine

## 2017-11-04 DIAGNOSIS — I255 Ischemic cardiomyopathy: Secondary | ICD-10-CM

## 2017-11-04 DIAGNOSIS — R002 Palpitations: Secondary | ICD-10-CM

## 2017-11-04 DIAGNOSIS — I493 Ventricular premature depolarization: Secondary | ICD-10-CM

## 2017-11-04 NOTE — Telephone Encounter (Signed)
Notes recorded by Deboraha Sprang, MD on 11/03/2017 at 12:20 PM EST Please Inform Patient Remote reviewed. This remote is abnormal for PVC count about 7 % day This is upr from last year-- we should plan to see him again in about 3 months with prior echo   I spoke with the patient regarding his recent transmission and Dr. Olin Pia recommendations.  Transmission date was 09/28/17. The patient is agreeable to early follow up with an echo prior. He is aware we will arrange for his appointments in late February/ early March. Will forward to Melissa/ echo scheduling to contact the patient regarding appointments.

## 2017-11-30 DIAGNOSIS — L570 Actinic keratosis: Secondary | ICD-10-CM | POA: Diagnosis not present

## 2017-11-30 DIAGNOSIS — C44329 Squamous cell carcinoma of skin of other parts of face: Secondary | ICD-10-CM | POA: Diagnosis not present

## 2017-11-30 DIAGNOSIS — L578 Other skin changes due to chronic exposure to nonionizing radiation: Secondary | ICD-10-CM | POA: Diagnosis not present

## 2017-12-01 ENCOUNTER — Other Ambulatory Visit: Payer: Self-pay | Admitting: Physician Assistant

## 2017-12-14 ENCOUNTER — Ambulatory Visit (HOSPITAL_COMMUNITY)
Admission: RE | Admit: 2017-12-14 | Discharge: 2017-12-14 | Disposition: A | Discharge status: Home / Self Care | Payer: Medicare Other | Source: Ambulatory Visit | Attending: Surgery | Admitting: Surgery

## 2017-12-14 ENCOUNTER — Encounter: Payer: Self-pay | Admitting: Family

## 2017-12-14 ENCOUNTER — Ambulatory Visit (INDEPENDENT_AMBULATORY_CARE_PROVIDER_SITE_OTHER): Payer: Medicare Other | Admitting: Family

## 2017-12-14 VITALS — BP 144/90 | HR 65 | Temp 97.6°F | Resp 18 | Ht 70.0 in | Wt 172.9 lb

## 2017-12-14 DIAGNOSIS — I6523 Occlusion and stenosis of bilateral carotid arteries: Secondary | ICD-10-CM | POA: Diagnosis not present

## 2017-12-14 DIAGNOSIS — I255 Ischemic cardiomyopathy: Secondary | ICD-10-CM | POA: Diagnosis not present

## 2017-12-14 LAB — VAS US CAROTID
LCCADDIAS: 13 cm/s
LCCADSYS: 59 cm/s
LCCAPDIAS: 14 cm/s
LCCAPSYS: 67 cm/s
LEFT ECA DIAS: -8 cm/s
LEFT VERTEBRAL DIAS: 22 cm/s
LICADDIAS: -21 cm/s
LICAPDIAS: 45 cm/s
Left ICA dist sys: -93 cm/s
Left ICA prox sys: 135 cm/s
RCCAPSYS: 55 cm/s
RIGHT CCA MID DIAS: 10 cm/s
RIGHT ECA DIAS: -4 cm/s
RIGHT VERTEBRAL DIAS: -17 cm/s
Right CCA prox dias: 10 cm/s
Right cca dist sys: -56 cm/s

## 2017-12-14 NOTE — Patient Instructions (Signed)

## 2017-12-14 NOTE — Progress Notes (Signed)
Chief Complaint: Follow up Extracranial Carotid Artery Stenosis   History of Present Illness  Connor Wilkerson is a 77 y.o. male whom Dr. Trula Slade initially saw in 2015 for syncope as well as carotid stenosis. At that time he had had multiple falls as well as gait instability. It was ultimately determined that his symptoms were related to hypotension, as indicated by medication manipulation and resolution of his symptoms. He did have an episode of dysarthria and garbled speech during this timeframe. He does not have any permanent deficits. While he was going through this, Dr. Trula Slade attempted to get additional imaging. He had gone into renal failure and therefore a CT scan was not possible. He cannot get an MRI because of his pacemaker.  CT angiogram in 2016 showed approximately 50-60% bilateral carotid stenosis  After correction of the patient's hypotension, his symptoms resolved. Pt has been monitored for a moderate carotid stenosis.  He reports no neurologic symptoms since he was evaluated by Dr. Trula Slade on 06-01-17. At that time Dr. Trula Slade discussed with pt that we would consider elective intervention once the stenosis becomes greater than 80%; would also consider intervention should pt develop any neurologic symptoms that would be consistent with carotid stenosis.  The patient is now being placed on surveillance protocol. Pt was to follow up in 6 months with a carotid duplex.  Diabetic: no Tobacco use: former smoker, quit about 2001, was a pipe smoker, never cigarettes   Pt meds include: Statin : yes ASA: yes Other anticoagulants/antiplatelets: Plavix   Past Medical History:  Diagnosis Date  . Abnormality of gait 02/07/2015  . Atrial fibrillation ([place])   . Dyspnea   . ICD (implantable cardiac defibrillator)  BSx   . Ischemic cardiomyopathy   . Myocardial infarction ([place])   . Near syncope 09/21/2014  . Peripheral vascular disease [facility])     Social History Social History    Tobacco Use  . Smoking status: Former Smoker    Last attempt to quit: 09/19/1999    Years since quitting: 18.2  . Smokeless tobacco: Never Used  . Tobacco comment: quit in 1998  Substance Use Topics  . Alcohol use: No    Alcohol/week: 0.0 oz    Comment: recovering alcoholic, quit 50+ yrs ago  . Drug use: No    Family History Family History  Problem Relation Age of Onset  . Heart disease Mother   . Heart attack Mother   . Heart disease Father   . Heart attack Father   . Heart disease Brother   . Heart attack Brother     Surgical History Past Surgical History:  Procedure Laterality Date  . CARDIAC CATHETERIZATION N/A 12/11/2015   Procedure: Left Heart Cath and Coronary Angiography;  Surgeon: Charolette Forward, MD;  Location: [place] CV LAB;  Service: Cardiovascular;  Laterality: N/A;  . CARDIAC DEFIBRILLATOR PLACEMENT    . CARDIAC DEFIBRILLATOR PLACEMENT  04/08/2016   NOT MRI SAFE (DOCUMENT SCANNED IN SYSTEM  . CORONARY ARTERY BYPASS GRAFT    . ELBOW SURGERY Left    plastic bone replacement  . HERNIA REPAIR     x2  . PERMANENT PACEMAKER GENERATOR CHANGE N/A 11/25/2013   Procedure: PERMANENT PACEMAKER GENERATOR CHANGE;  Surgeon: Deboraha Sprang, MD;  [facility] CATH LAB;  Service: Cardiovascular;  Laterality: N/A;  . ROTATOR CUFF REPAIR Right 2006  . SINUS EXPLORATION  1987    Allergies  Allergen Reactions  . Amlodipine     Pt not sure  if this is an allergy  . Tramadol Other (See Comments)    Unknown     Current Outpatient Medications  Medication Sig Dispense Refill  . amLODipine (NORVASC) 5 MG tablet Take 1 tablet (5 mg total) by mouth daily. 30 tablet 11  . aspirin 81 MG tablet Take 81 mg by mouth daily.     Marland Kitchen buPROPion (WELLBUTRIN XL) 300 MG 24 hr tablet Take 300 mg by mouth at bedtime. Reported on 03/24/2016    . carvedilol (COREG) 6.25 MG tablet TAKE 1 TABLET (6.25 MG TOTAL) BY MOUTH 2 (TWO) TIMES DAILY. 60 tablet 8  . clopidogrel (PLAVIX) 75 MG tablet Take 1  tablet (75 mg total) by mouth at bedtime. 90 tablet 3  . colchicine 0.6 MG tablet     . cyclobenzaprine (FLEXERIL) 5 MG tablet     . escitalopram (LEXAPRO) 10 MG tablet Take 10 mg by mouth daily.     . famotidine (PEPCID) 20 MG tablet Take 20 mg by mouth 2 (two) times daily.     Marland Kitchen gabapentin (NEURONTIN) 300 MG capsule Take 300 mg by mouth at bedtime. May need 600 mg in the evening at night    . nitroGLYCERIN (NITROSTAT) 0.4 MG SL tablet Place 1 tablet (0.4 mg total) under the tongue every 5 (five) minutes as needed for chest pain. 25 tablet 5  . oxyCODONE-acetaminophen (PERCOCET) 10-325 MG tablet Take 1 tablet by mouth 2 (two) times daily as needed for pain.     . rosuvastatin (CRESTOR) 40 MG tablet Take 1 tablet (40 mg total) by mouth at bedtime. Reported on 05/19/2016 90 tablet 3  . sacubitril-valsartan (ENTRESTO) 97-103 MG Take 1 tablet by mouth 2 (two) times daily. 60 tablet 11  . spironolactone (ALDACTONE) 25 MG tablet TAKE 1 TABLET (25 MG TOTAL) BY MOUTH DAILY. 30 tablet 8   Current Facility-Administered Medications  Medication Dose Route Frequency Provider Last Rate Last Dose  . cloNIDine (CATAPRES) tablet 0.2 mg  0.2 mg Oral Once Deboraha Sprang, MD        Review of Systems : See HPI for pertinent positives and negatives.  Physical Examination  Vitals:   12/14/17 1527 12/14/17 1530  BP: (!) 155/94 (!) 144/90  Pulse: 65   Resp: 18   Temp: 97.6 F (36.4 C)   TempSrc: Oral   SpO2: 97%   Weight: 172 lb 14.4 oz (78.4 kg)   Height: 5\' 10"  (1.778 m)    Body mass index is 24.81 kg/m.  General: WDWN male in NAD GAIT: normal HENT: No gross abnormalities  Eyes: PERRLA Pulmonary:  Non-labored respirations, with somewhat decreased air movement in bilateral posterior fields, fine rales in bilateral bases, no rhonchi or wheezing. Occasional dry cough.  Cardiac: regular rhythm,  no detected murmur.  VASCULAR EXAM Carotid Bruits Right Left   Negative Negative     Abdominal  aortic pulse is not palpable. Radial pulses are palpable and equal.  LE Pulses Right Left       POPLITEAL  not palpable   not palpable       POSTERIOR TIBIAL   palpable    palpable        DORSALIS PEDIS      ANTERIOR TIBIAL  palpable   palpable     Gastrointestinal: soft, nontender, BS WNL, no r/g, no palpable masses. Musculoskeletal: no muscle atrophy/wasting. M/S 5/5 throughout, extremities without ischemic changes.  Neurologic: A&O X 3; appropriate affect, speech is normal, CN 2-12 intact, pain and light touch intact in extremities, Motor exam as listed above. Psychiatric: Normal thought content, mood appropriate to clinical situation.    Assessment: Connor Wilkerson is a 77 y.o. male who has no known history of stroke or TIA.  His syncopal episodes seem to improve with decreasing the doses of his medications that have hypotensive effects. He still seems to have orthostatic hypotension on standing.   Fortunately he stopped smoking a pipe about 2001, he never smoked cigarettes. He does not have DM and is not obese. He mows his lawn and remains active.  He takes a statin, ASA, and Plavix. His atherosclerotic risk factors include prior tobacco use and significant CAD.    DATA Carotid Duplex (12/14/17): Bilateral ICA: 40-59% stenosis.  Bilateral vertebral artery flow is antegrade.  Unable to obtain higher velocities in the right ICA compared to the exam on 06-01-17.   Plan: Follow-up in 9 months with Carotid Duplex scan.   I discussed in depth with the patient the nature of atherosclerosis, and emphasized the importance of maximal medical management including strict control of blood pressure, blood glucose, and lipid levels, obtaining regular exercise, and continued cessation of smoking.  The patient is aware that without maximal medical  management the underlying atherosclerotic disease process will progress, limiting the benefit of any interventions. The patient was given information about stroke prevention and what symptoms should prompt the patient to seek immediate medical care. Thank you for allowing [place] to participate in this patient's care.  Clemon Chambers, RN, MSN, FNP-C [facility] [place] Office: [facility] Physician: Trula Slade  12/14/17 3:48 PM

## 2017-12-15 ENCOUNTER — Other Ambulatory Visit: Payer: Self-pay | Admitting: Physician Assistant

## 2017-12-15 NOTE — Addendum Note (Signed)
Addended by: Lianne Cure A on: 12/15/2017 11:13 AM   Modules accepted: Orders

## 2017-12-24 ENCOUNTER — Other Ambulatory Visit: Payer: Self-pay | Admitting: Physician Assistant

## 2017-12-28 ENCOUNTER — Ambulatory Visit (INDEPENDENT_AMBULATORY_CARE_PROVIDER_SITE_OTHER): Payer: Medicare Other | Admitting: *Deleted

## 2017-12-28 DIAGNOSIS — I255 Ischemic cardiomyopathy: Secondary | ICD-10-CM

## 2017-12-28 NOTE — Progress Notes (Signed)
Remote ICD transmission.   

## 2017-12-30 ENCOUNTER — Ambulatory Visit (HOSPITAL_COMMUNITY): Payer: Medicare Other | Attending: Cardiovascular Disease

## 2017-12-30 ENCOUNTER — Other Ambulatory Visit: Payer: Self-pay

## 2017-12-30 DIAGNOSIS — I5189 Other ill-defined heart diseases: Secondary | ICD-10-CM | POA: Diagnosis not present

## 2017-12-30 DIAGNOSIS — I4891 Unspecified atrial fibrillation: Secondary | ICD-10-CM | POA: Insufficient documentation

## 2017-12-30 DIAGNOSIS — I255 Ischemic cardiomyopathy: Secondary | ICD-10-CM | POA: Insufficient documentation

## 2017-12-30 DIAGNOSIS — I252 Old myocardial infarction: Secondary | ICD-10-CM | POA: Diagnosis not present

## 2017-12-31 ENCOUNTER — Encounter: Payer: Self-pay | Admitting: Cardiology

## 2018-01-04 ENCOUNTER — Ambulatory Visit (INDEPENDENT_AMBULATORY_CARE_PROVIDER_SITE_OTHER): Payer: Medicare Other | Admitting: Internal Medicine

## 2018-01-04 DIAGNOSIS — I493 Ventricular premature depolarization: Secondary | ICD-10-CM | POA: Diagnosis not present

## 2018-01-04 DIAGNOSIS — Z4502 Encounter for adjustment and management of automatic implantable cardiac defibrillator: Secondary | ICD-10-CM | POA: Diagnosis not present

## 2018-01-04 DIAGNOSIS — Z01812 Encounter for preprocedural laboratory examination: Secondary | ICD-10-CM | POA: Diagnosis not present

## 2018-01-04 DIAGNOSIS — I255 Ischemic cardiomyopathy: Secondary | ICD-10-CM

## 2018-01-04 NOTE — Patient Instructions (Addendum)
Medication Instructions:  Your physician recommends that you continue on your current medications as directed. Please refer to the Current Medication list given to you today.  Labwork: CBC and BMP  Testing/Procedures:  Cardiac Catheterization March 7th, 2019 at 12:00pm with Dr Julianne Handler  Follow-Up: Your physician recommends that you schedule a follow-up appointment in:  4-6 weeks with Tommye Standard, PA  Remote monitoring is used to monitor your ICD from home. This monitoring reduces the number of office visits required to check your device to one time per year. It allows [place] to keep an eye on the functioning of your device to ensure it is working properly. You are scheduled for a device check from home on 03/30/2018. You may send your transmission at any time that day. If you have a wireless device, the transmission will be sent automatically. After your physician reviews your transmission, you will receive a postcard with your next transmission date.   Any Other Special Instructions Will Be Listed Below (If Applicable).     If you need a refill on your cardiac medications before your next appointment, please call your pharmacy.

## 2018-01-04 NOTE — Progress Notes (Signed)
Patient Care Team: Ernestene Kiel, MD as PCP - General (Internal Medicine) Deboraha Sprang, MD as Consulting Physician (Cardiology) Serafina Mitchell, MD as Consulting Physician (Vascular Surgery) Kathrynn Ducking, MD as Consulting Physician (Neurology)   HPI  Connor Wilkerson is a 77 y.o. male Seen following ICD generator replacement initally implanted 2006 for primary prevention in setting of ischemic cardiomyopathy.   subsequently  the device pocket moved to subpectorally.   He has hx of remote [place], CABG  1999  He underwent catheterization 2/17 following an abnormal Myoview scan raising the possibility of ischemia this having been done in the context of chest pain. Catheterization was done by Dr. [facility]; it demonstrated no interval change with patent grafts.    DATE TEST EF   11/16 Myoview  34 %   2/17 Cath  LM 30%; Native T; LIMA patent; SVG-PDA 70-80% anastomosis SVG-OM Patent  2/19  Echo  30-35%          Remote interrogation 1/19 PVCs about 7%.   He has complaints of progressive fatigue and exercise intolerance.  Prior to his bypass, he was having diaphoresis and chest pain.  He has had none of that.  Symptoms have changed progressively over the last 6 months.  He has occasional peripheral edema but denies nocturnal dyspnea or orthopnea.  He has had no palpitations.          Past Medical History     Past Medical History:  Diagnosis Date  . Abnormality of gait 02/07/2015  . Atrial fibrillation ([place])   . Dyspnea   . ICD (implantable cardiac defibrillator)  BSx   . Ischemic cardiomyopathy   . Myocardial infarction ([place])   . Near syncope 09/21/2014  . Peripheral vascular disease [facility])     Past Surgical History:  Procedure Laterality Date  . CARDIAC CATHETERIZATION N/A 12/11/2015   Procedure: Left Heart Cath and Coronary Angiography;  Surgeon: Charolette Forward, MD;  Location: [place] CV LAB;  Service: Cardiovascular;  Laterality: N/A;  . CARDIAC  DEFIBRILLATOR PLACEMENT    . CARDIAC DEFIBRILLATOR PLACEMENT  04/08/2016   NOT MRI SAFE (DOCUMENT SCANNED IN SYSTEM  . CORONARY ARTERY BYPASS GRAFT    . ELBOW SURGERY Left    plastic bone replacement  . HERNIA REPAIR     x2  . PERMANENT PACEMAKER GENERATOR CHANGE N/A 11/25/2013   Procedure: PERMANENT PACEMAKER GENERATOR CHANGE;  Surgeon: Deboraha Sprang, MD;  Location: [facility] CATH LAB;  Service: Cardiovascular;  Laterality: N/A;  . ROTATOR CUFF REPAIR Right 2006  . SINUS EXPLORATION  1987    Current Outpatient Medications  Medication Sig Dispense Refill  . amLODipine (NORVASC) 5 MG tablet Take 1 tablet (5 mg total) by mouth daily. 30 tablet 7  . aspirin 81 MG tablet Take 81 mg by mouth daily.     Marland Kitchen buPROPion (WELLBUTRIN XL) 300 MG 24 hr tablet Take 300 mg by mouth at bedtime. Reported on 03/24/2016    . carvedilol (COREG) 6.25 MG tablet TAKE 1 TABLET (6.25 MG TOTAL) BY MOUTH 2 (TWO) TIMES DAILY. 60 tablet 8  . clopidogrel (PLAVIX) 75 MG tablet Take 1 tablet (75 mg total) by mouth at bedtime. 90 tablet 3  . colchicine 0.6 MG tablet     . cyclobenzaprine (FLEXERIL) 5 MG tablet     . escitalopram (LEXAPRO) 10 MG tablet Take 10 mg by mouth daily.     . famotidine (PEPCID) 20 MG tablet Take 20  mg by mouth 2 (two) times daily.     Marland Kitchen gabapentin (NEURONTIN) 300 MG capsule Take 300 mg by mouth at bedtime. May need 600 mg in the evening at night    . nitroGLYCERIN (NITROSTAT) 0.4 MG SL tablet Place 1 tablet (0.4 mg total) under the tongue every 5 (five) minutes as needed for chest pain. 25 tablet 5  . oxyCODONE-acetaminophen (PERCOCET) 10-325 MG tablet Take 1 tablet by mouth 2 (two) times daily as needed for pain.     . rosuvastatin (CRESTOR) 40 MG tablet TAKE 1 TABLET BY MOUTH AT BEDTIME. REPORTED ON 05/19/2016 90 tablet 2  . sacubitril-valsartan (ENTRESTO) 97-103 MG Take 1 tablet by mouth 2 (two) times daily. 60 tablet 11  . spironolactone (ALDACTONE) 25 MG tablet TAKE 1 TABLET (25 MG TOTAL) BY MOUTH  DAILY. 30 tablet 8   Current Facility-Administered Medications  Medication Dose Route Frequency Provider Last Rate Last Dose  . cloNIDine (CATAPRES) tablet 0.2 mg  0.2 mg Oral Once Deboraha Sprang, MD        Allergies  Allergen Reactions  . Tramadol Other (See Comments)    Unknown     Review of Systems negative except from HPI and PMH  Physical Exam BP (!) 173/99 (BP Location: Right Arm)   Pulse 61   Resp 16   Ht 5\' 10"  (1.778 m)   Wt 172 lb 3.2 oz (78.1 kg)   BMI 24.71 kg/m  Well developed and nourished in no acute distress HENT normal Neck supple with JVP 8 Carotids brisk and full without bruits Clear Regular rate and rhythm, 2/6 or gallops Abd-soft with active BS without hepatomegaly No Clubbing cyanosis edema Skin-warm and dry A & Oriented  Grossly normal sensory and motor function    ECG demonstrates sinus at 61 Interval 17/09/43 NSSTT changes unchanged from 10/18   Assessment and  Plan\  Ischemic cardiomyopathy  Implantable defibrillator The patient's device was interrogated.  The information was reviewed. No changes were made in the programming.     Hypertension   PVC  Fatigue/exercise tolerance  Congestive heart failure-chronic-systolic  PVC count has been relatively stable at about 7%.  Ejection fraction is also stable.  BP very elevated but says normal at home  With  worsening exercise intolerance, and w retained LV function, with grafts almost 77 yrs  Old   progressive coronary disease is a major concern.   We have discussed the risks and benefits of the procedure.   He remains on high-dose statins  More than 50% of 45 min was spent in counseling related to the above

## 2018-01-04 NOTE — H&P (View-Only) (Signed)
Patient Care Team: Ernestene Kiel, MD as PCP - General (Internal Medicine) Deboraha Sprang, MD as Consulting Physician (Cardiology) Serafina Mitchell, MD as Consulting Physician (Vascular Surgery) Kathrynn Ducking, MD as Consulting Physician (Neurology)   HPI  Connor Wilkerson is a 77 y.o. male Seen following ICD generator replacement initally implanted 2006 for primary prevention in setting of ischemic cardiomyopathy.   subsequently  the device pocket moved to subpectorally.   He has hx of remote [place], CABG  1999  He underwent catheterization 2/17 following an abnormal Myoview scan raising the possibility of ischemia this having been done in the context of chest pain. Catheterization was done by Dr. [facility]; it demonstrated no interval change with patent grafts.    DATE TEST EF   11/16 Myoview  34 %   2/17 Cath  LM 30%; Native T; LIMA patent; SVG-PDA 70-80% anastomosis SVG-OM Patent  2/19  Echo  30-35%          Remote interrogation 1/19 PVCs about 7%.   He has complaints of progressive fatigue and exercise intolerance.  Prior to his bypass, he was having diaphoresis and chest pain.  He has had none of that.  Symptoms have changed progressively over the last 6 months.  He has occasional peripheral edema but denies nocturnal dyspnea or orthopnea.  He has had no palpitations.          Past Medical History     Past Medical History:  Diagnosis Date  . Abnormality of gait 02/07/2015  . Atrial fibrillation ([place])   . Dyspnea   . ICD (implantable cardiac defibrillator)  BSx   . Ischemic cardiomyopathy   . Myocardial infarction ([place])   . Near syncope 09/21/2014  . Peripheral vascular disease [facility])     Past Surgical History:  Procedure Laterality Date  . CARDIAC CATHETERIZATION N/A 12/11/2015   Procedure: Left Heart Cath and Coronary Angiography;  Surgeon: Charolette Forward, MD;  Location: [place] CV LAB;  Service: Cardiovascular;  Laterality: N/A;  . CARDIAC  DEFIBRILLATOR PLACEMENT    . CARDIAC DEFIBRILLATOR PLACEMENT  04/08/2016   NOT MRI SAFE (DOCUMENT SCANNED IN SYSTEM  . CORONARY ARTERY BYPASS GRAFT    . ELBOW SURGERY Left    plastic bone replacement  . HERNIA REPAIR     x2  . PERMANENT PACEMAKER GENERATOR CHANGE N/A 11/25/2013   Procedure: PERMANENT PACEMAKER GENERATOR CHANGE;  Surgeon: Deboraha Sprang, MD;  Location: [facility] CATH LAB;  Service: Cardiovascular;  Laterality: N/A;  . ROTATOR CUFF REPAIR Right 2006  . SINUS EXPLORATION  1987    Current Outpatient Medications  Medication Sig Dispense Refill  . amLODipine (NORVASC) 5 MG tablet Take 1 tablet (5 mg total) by mouth daily. 30 tablet 7  . aspirin 81 MG tablet Take 81 mg by mouth daily.     Marland Kitchen buPROPion (WELLBUTRIN XL) 300 MG 24 hr tablet Take 300 mg by mouth at bedtime. Reported on 03/24/2016    . carvedilol (COREG) 6.25 MG tablet TAKE 1 TABLET (6.25 MG TOTAL) BY MOUTH 2 (TWO) TIMES DAILY. 60 tablet 8  . clopidogrel (PLAVIX) 75 MG tablet Take 1 tablet (75 mg total) by mouth at bedtime. 90 tablet 3  . colchicine 0.6 MG tablet     . cyclobenzaprine (FLEXERIL) 5 MG tablet     . escitalopram (LEXAPRO) 10 MG tablet Take 10 mg by mouth daily.     . famotidine (PEPCID) 20 MG tablet Take 20  mg by mouth 2 (two) times daily.     Marland Kitchen gabapentin (NEURONTIN) 300 MG capsule Take 300 mg by mouth at bedtime. May need 600 mg in the evening at night    . nitroGLYCERIN (NITROSTAT) 0.4 MG SL tablet Place 1 tablet (0.4 mg total) under the tongue every 5 (five) minutes as needed for chest pain. 25 tablet 5  . oxyCODONE-acetaminophen (PERCOCET) 10-325 MG tablet Take 1 tablet by mouth 2 (two) times daily as needed for pain.     . rosuvastatin (CRESTOR) 40 MG tablet TAKE 1 TABLET BY MOUTH AT BEDTIME. REPORTED ON 05/19/2016 90 tablet 2  . sacubitril-valsartan (ENTRESTO) 97-103 MG Take 1 tablet by mouth 2 (two) times daily. 60 tablet 11  . spironolactone (ALDACTONE) 25 MG tablet TAKE 1 TABLET (25 MG TOTAL) BY MOUTH  DAILY. 30 tablet 8   Current Facility-Administered Medications  Medication Dose Route Frequency Provider Last Rate Last Dose  . cloNIDine (CATAPRES) tablet 0.2 mg  0.2 mg Oral Once Deboraha Sprang, MD        Allergies  Allergen Reactions  . Tramadol Other (See Comments)    Unknown     Review of Systems negative except from HPI and PMH  Physical Exam BP (!) 173/99 (BP Location: Right Arm)   Pulse 61   Resp 16   Ht 5\' 10"  (1.778 m)   Wt 172 lb 3.2 oz (78.1 kg)   BMI 24.71 kg/m  Well developed and nourished in no acute distress HENT normal Neck supple with JVP 8 Carotids brisk and full without bruits Clear Regular rate and rhythm, 2/6 or gallops Abd-soft with active BS without hepatomegaly No Clubbing cyanosis edema Skin-warm and dry A & Oriented  Grossly normal sensory and motor function    ECG demonstrates sinus at 61 Interval 17/09/43 NSSTT changes unchanged from 10/18   Assessment and  Plan\  Ischemic cardiomyopathy  Implantable defibrillator The patient's device was interrogated.  The information was reviewed. No changes were made in the programming.     Hypertension   PVC  Fatigue/exercise tolerance  Congestive heart failure-chronic-systolic  PVC count has been relatively stable at about 7%.  Ejection fraction is also stable.  BP very elevated but says normal at home  With  worsening exercise intolerance, and w retained LV function, with grafts almost 77 yrs  Old   progressive coronary disease is a major concern.   We have discussed the risks and benefits of the procedure.   He remains on high-dose statins  More than 50% of 45 min was spent in counseling related to the above

## 2018-01-05 LAB — BASIC METABOLIC PANEL
BUN/Creatinine Ratio: 19 (ref 10–24)
BUN: 30 mg/dL — ABNORMAL HIGH (ref 8–27)
CALCIUM: 9.5 mg/dL (ref 8.6–10.2)
CO2: 25 mmol/L (ref 20–29)
CREATININE: 1.57 mg/dL — AB (ref 0.76–1.27)
Chloride: 106 mmol/L (ref 96–106)
GFR, EST AFRICAN AMERICAN: 49 mL/min/{1.73_m2} — AB (ref >59)
GFR, EST NON AFRICAN AMERICAN: 42 mL/min/{1.73_m2} — AB (ref >59)
Glucose: 104 mg/dL — ABNORMAL HIGH (ref 65–99)
Potassium: 4.8 mmol/L (ref 3.5–5.2)
Sodium: 147 mmol/L — ABNORMAL HIGH (ref 134–144)

## 2018-01-05 LAB — CBC WITH DIFFERENTIAL/PLATELET
BASOS: 0 %
Basophils Absolute: 0 10*3/uL (ref 0.0–0.2)
EOS (ABSOLUTE): 0.2 10*3/uL (ref 0.0–0.4)
EOS: 2 %
HEMATOCRIT: 42.8 % (ref 37.5–51.0)
HEMOGLOBIN: 14.2 g/dL (ref 13.0–17.7)
Immature Grans (Abs): 0 10*3/uL (ref 0.0–0.1)
Immature Granulocytes: 0 %
Lymphocytes Absolute: 2.1 10*3/uL (ref 0.7–3.1)
Lymphs: 25 %
MCH: 32.7 pg (ref 26.6–33.0)
MCHC: 33.2 g/dL (ref 31.5–35.7)
MCV: 99 fL — AB (ref 79–97)
MONOCYTES: 9 %
MONOS ABS: 0.8 10*3/uL (ref 0.1–0.9)
Neutrophils Absolute: 5.6 10*3/uL (ref 1.4–7.0)
Neutrophils: 64 %
Platelets: 232 10*3/uL (ref 150–379)
RBC: 4.34 x10E6/uL (ref 4.14–5.80)
RDW: 12.8 % (ref 12.3–15.4)
WBC: 8.8 10*3/uL (ref 3.4–10.8)

## 2018-01-06 ENCOUNTER — Telehealth: Payer: Self-pay | Admitting: *Deleted

## 2018-01-06 NOTE — Telephone Encounter (Signed)
Pt contacted pre-catheterization scheduled at [facility] for: Thursday January 07, 2018 12 N Verified arrival time and place: [place] at: 10 AM Nothing to eat or drink after midnight tonight. Verified allergies in Epic. Verified no diabetes medications.  Hold: Spironolactone AM 01/07/18  Except hold medications AM meds can be  taken pre-cath with sips of water including: ASA 81 mg   Confirmed patient has responsible person to drive home post procedure and observe patient for 24 hours: yes  Pt encouraged to stay hydrated today pre-cath.

## 2018-01-07 ENCOUNTER — Ambulatory Visit (HOSPITAL_COMMUNITY)
Admission: RE | Admit: 2018-01-07 | Discharge: 2018-01-07 | Disposition: A | Discharge status: Home / Self Care | Payer: Medicare Other | Source: Ambulatory Visit | Attending: Cardiovascular Disease | Admitting: Cardiovascular Disease

## 2018-01-07 ENCOUNTER — Ambulatory Visit (HOSPITAL_COMMUNITY)
Admission: RE | Disposition: A | Discharge status: Home / Self Care | Payer: Self-pay | Source: Ambulatory Visit | Attending: Cardiovascular Disease

## 2018-01-07 DIAGNOSIS — Z7982 Long term (current) use of aspirin: Secondary | ICD-10-CM | POA: Diagnosis not present

## 2018-01-07 DIAGNOSIS — Z79899 Other long term (current) drug therapy: Secondary | ICD-10-CM | POA: Diagnosis not present

## 2018-01-07 DIAGNOSIS — I110 Hypertensive heart disease with heart failure: Secondary | ICD-10-CM | POA: Diagnosis not present

## 2018-01-07 DIAGNOSIS — I493 Ventricular premature depolarization: Secondary | ICD-10-CM | POA: Diagnosis not present

## 2018-01-07 DIAGNOSIS — I2510 Atherosclerotic heart disease of native coronary artery without angina pectoris: Secondary | ICD-10-CM | POA: Diagnosis not present

## 2018-01-07 DIAGNOSIS — I255 Ischemic cardiomyopathy: Secondary | ICD-10-CM | POA: Diagnosis not present

## 2018-01-07 DIAGNOSIS — I739 Peripheral vascular disease, unspecified: Secondary | ICD-10-CM | POA: Diagnosis not present

## 2018-01-07 DIAGNOSIS — I252 Old myocardial infarction: Secondary | ICD-10-CM | POA: Diagnosis not present

## 2018-01-07 DIAGNOSIS — Z951 Presence of aortocoronary bypass graft: Secondary | ICD-10-CM | POA: Diagnosis not present

## 2018-01-07 DIAGNOSIS — R269 Unspecified abnormalities of gait and mobility: Secondary | ICD-10-CM | POA: Insufficient documentation

## 2018-01-07 DIAGNOSIS — I509 Heart failure, unspecified: Secondary | ICD-10-CM | POA: Diagnosis not present

## 2018-01-07 DIAGNOSIS — I4891 Unspecified atrial fibrillation: Secondary | ICD-10-CM | POA: Insufficient documentation

## 2018-01-07 DIAGNOSIS — Z95810 Presence of automatic (implantable) cardiac defibrillator: Secondary | ICD-10-CM | POA: Insufficient documentation

## 2018-01-07 DIAGNOSIS — Z888 Allergy status to other drugs, medicaments and biological substances status: Secondary | ICD-10-CM | POA: Insufficient documentation

## 2018-01-07 HISTORY — PX: LEFT HEART CATH AND CORS/GRAFTS ANGIOGRAPHY: CATH118250

## 2018-01-07 HISTORY — PX: ULTRASOUND GUIDANCE FOR VASCULAR ACCESS: SHX6516

## 2018-01-07 LAB — CUP PACEART INCLINIC DEVICE CHECK
Date Time Interrogation Session: 20190304050000
HighPow Impedance: 48 Ohm
Implantable Lead Implant Date: 20060512
Implantable Lead Location: 753860
Implantable Lead Model: 5076
Lead Channel Impedance Value: 413 Ohm
Lead Channel Pacing Threshold Amplitude: 0.7 V
Lead Channel Pacing Threshold Pulse Width: 0.4 ms
Lead Channel Sensing Intrinsic Amplitude: 1.7 mV
Lead Channel Setting Pacing Amplitude: 2 V
Lead Channel Setting Pacing Amplitude: 2.4 V
Lead Channel Setting Pacing Pulse Width: 0.4 ms
MDC IDC LEAD IMPLANT DT: 20060512
MDC IDC LEAD LOCATION: 753859
MDC IDC LEAD SERIAL: 159477
MDC IDC MSMT LEADCHNL RA PACING THRESHOLD PULSEWIDTH: 0.4 ms
MDC IDC MSMT LEADCHNL RV IMPEDANCE VALUE: 635 Ohm
MDC IDC MSMT LEADCHNL RV PACING THRESHOLD AMPLITUDE: 0.9 V
MDC IDC MSMT LEADCHNL RV SENSING INTR AMPL: 9.7 mV
MDC IDC PG IMPLANT DT: 20150123
MDC IDC SET LEADCHNL RV SENSING SENSITIVITY: 0.6 mV
Pulse Gen Serial Number: 112776

## 2018-01-07 LAB — PROTIME-INR
INR: 1.04
PROTHROMBIN TIME: 13.5 s (ref 11.4–15.2)

## 2018-01-07 SURGERY — LEFT HEART CATH AND CORS/GRAFTS ANGIOGRAPHY
Anesthesia: LOCAL

## 2018-01-07 MED ORDER — MIDAZOLAM HCL 2 MG/2ML IJ SOLN
INTRAMUSCULAR | Status: AC
  Filled 2018-01-07: qty 2

## 2018-01-07 MED ORDER — SODIUM CHLORIDE 0.9 % IV SOLN: 250.0000 mL | INTRAVENOUS | Status: DC | PRN

## 2018-01-07 MED ORDER — CLOPIDOGREL BISULFATE 75 MG PO TABS
75.0000 mg | ORAL_TABLET | Freq: Once | ORAL | Status: AC
  Administered 2018-01-07: 75 mg via ORAL

## 2018-01-07 MED ORDER — LIDOCAINE HCL (PF) 1 % IJ SOLN
INTRAMUSCULAR | Status: DC | PRN
  Administered 2018-01-07: 14 mL
  Administered 2018-01-07: 2 mL

## 2018-01-07 MED ORDER — MIDAZOLAM HCL 2 MG/2ML IJ SOLN
INTRAMUSCULAR | Status: AC
Start: 2018-01-07 — End: 2018-01-07
  Filled 2018-01-07: qty 2

## 2018-01-07 MED ORDER — SODIUM CHLORIDE 0.9 % WEIGHT BASED INFUSION
3.0000 mL/kg/h | INTRAVENOUS | Status: AC
  Administered 2018-01-07: 3 mL/kg/h via INTRAVENOUS

## 2018-01-07 MED ORDER — SODIUM CHLORIDE 0.9 % WEIGHT BASED INFUSION: 1.0000 mL/kg/h | INTRAVENOUS | Status: DC

## 2018-01-07 MED ORDER — SODIUM CHLORIDE 0.9% FLUSH: 3.0000 mL | INTRAVENOUS | Status: DC | PRN

## 2018-01-07 MED ORDER — CLOPIDOGREL BISULFATE 75 MG PO TABS
ORAL_TABLET | ORAL | Status: AC
  Administered 2018-01-07: 75 mg via ORAL
  Filled 2018-01-07: qty 1

## 2018-01-07 MED ORDER — IOPAMIDOL (ISOVUE-370) INJECTION 76%
INTRAVENOUS | Status: AC
  Filled 2018-01-07: qty 50

## 2018-01-07 MED ORDER — FENTANYL CITRATE (PF) 100 MCG/2ML IJ SOLN
INTRAMUSCULAR | Status: DC | PRN
  Administered 2018-01-07 (×3): 25 ug via INTRAVENOUS

## 2018-01-07 MED ORDER — HEPARIN (PORCINE) IN NACL 2-0.9 UNIT/ML-% IJ SOLN
INTRAMUSCULAR | Status: AC | PRN
  Administered 2018-01-07 (×2): 500 mL

## 2018-01-07 MED ORDER — LIDOCAINE HCL 1 % IJ SOLN
INTRAMUSCULAR | Status: AC
  Filled 2018-01-07: qty 20

## 2018-01-07 MED ORDER — SODIUM CHLORIDE 0.9% FLUSH: 3.0000 mL | Freq: Two times a day (BID) | INTRAVENOUS | Status: DC

## 2018-01-07 MED ORDER — IOPAMIDOL (ISOVUE-370) INJECTION 76%
INTRAVENOUS | Status: DC | PRN
  Administered 2018-01-07: 95 mL via INTRAVENOUS

## 2018-01-07 MED ORDER — VERAPAMIL HCL 2.5 MG/ML IV SOLN
INTRAVENOUS | Status: AC
  Filled 2018-01-07: qty 2

## 2018-01-07 MED ORDER — HEPARIN SODIUM (PORCINE) 1000 UNIT/ML IJ SOLN
INTRAMUSCULAR | Status: AC
  Filled 2018-01-07: qty 1

## 2018-01-07 MED ORDER — ASPIRIN 81 MG PO CHEW: 81.0000 mg | CHEWABLE_TABLET | ORAL | Status: DC

## 2018-01-07 MED ORDER — SODIUM CHLORIDE 0.9 % IV SOLN: INTRAVENOUS | Status: DC

## 2018-01-07 MED ORDER — SODIUM CHLORIDE 0.9 % IV SOLN
INTRAVENOUS | Status: AC | PRN
  Administered 2018-01-07: 10 mL/h via INTRAVENOUS

## 2018-01-07 MED ORDER — FENTANYL CITRATE (PF) 100 MCG/2ML IJ SOLN
INTRAMUSCULAR | Status: AC
  Filled 2018-01-07: qty 2

## 2018-01-07 MED ORDER — MIDAZOLAM HCL 2 MG/2ML IJ SOLN
INTRAMUSCULAR | Status: DC | PRN
  Administered 2018-01-07 (×3): 1 mg via INTRAVENOUS

## 2018-01-07 MED ORDER — HEPARIN (PORCINE) IN NACL 2-0.9 UNIT/ML-% IJ SOLN
INTRAMUSCULAR | Status: AC
  Filled 2018-01-07: qty 1000

## 2018-01-07 MED ORDER — IOPAMIDOL (ISOVUE-370) INJECTION 76%
INTRAVENOUS | Status: AC
  Filled 2018-01-07: qty 100

## 2018-01-07 SURGICAL SUPPLY — 14 items
CATH INFINITI 5 FR RCB (CATHETERS) ×3 IMPLANT
CATH INFINITI 5FR AL1 (CATHETERS) ×3 IMPLANT
CATH INFINITI 5FR MULTPACK ANG (CATHETERS) ×3 IMPLANT
COVER PRB 48X5XTLSCP FOLD TPE (BAG) ×2 IMPLANT
COVER PROBE 5X48 (BAG) ×1
GLIDESHEATH SLEND SS 6F .021 (SHEATH) ×3 IMPLANT
GUIDEWIRE INQWIRE 1.5J.035X260 (WIRE) ×2 IMPLANT
INQWIRE 1.5J .035X260CM (WIRE) ×3
KIT HEART LEFT (KITS) ×3 IMPLANT
PACK CARDIAC CATHETERIZATION (CUSTOM PROCEDURE TRAY) ×3 IMPLANT
SHEATH PINNACLE 5F 10CM (SHEATH) ×3 IMPLANT
TRANSDUCER W/STOPCOCK (MISCELLANEOUS) ×3 IMPLANT
TUBING CIL FLEX 10 FLL-RA (TUBING) ×3 IMPLANT
WIRE EMERALD 3MM-J .035X150CM (WIRE) ×3 IMPLANT

## 2018-01-07 NOTE — Progress Notes (Signed)
Site area: rt groin fa sheath Site Prior to Removal:  Level 0 Pressure Applied For: 20 minutes Manual:   yes Patient Status During Pull:  stable Post Pull Site:  Level  0 Post Pull Instructions Given:  yes Post Pull Pulses Present:  Palpable rt dp Dressing Applied:  Gauze and tegaderm Bedrest begins @ 1505 Comments:

## 2018-01-07 NOTE — Interval H&P Note (Signed)
History and Physical Interval Note:  01/07/2018 12:54 PM  Connor Wilkerson  has presented today for cardiac cath with the diagnosis of unstable angina.  The various methods of treatment have been discussed with the patient and family. After consideration of risks, benefits and other options for treatment, the patient has consented to  Procedure(s): LEFT HEART CATH AND CORONARY ANGIOGRAPHY (N/A) as a surgical intervention .  The patient's history has been reviewed, patient examined, no change in status, stable for surgery.  I have reviewed the patient's chart and labs.  Questions were answered to the patient's satisfaction.    Cath Lab Visit (complete for each Cath Lab visit)  Clinical Evaluation Leading to the Procedure:   ACS: No.  Non-ACS:    Anginal Classification: CCS III  Anti-ischemic medical therapy: Maximal Therapy (2 or more classes of medications)  Non-Invasive Test Results: No non-invasive testing performed  Prior CABG: Previous CABG         Lauree Chandler

## 2018-01-07 NOTE — Discharge Instructions (Signed)

## 2018-01-08 ENCOUNTER — Encounter (HOSPITAL_COMMUNITY): Payer: Self-pay | Admitting: Cardiovascular Disease

## 2018-01-08 MED FILL — Verapamil HCl IV Soln 2.5 MG/ML: INTRAVENOUS | Qty: 2 | Status: AC

## 2018-01-08 MED FILL — Heparin Sodium (Porcine) 2 Unit/ML in Sodium Chloride 0.9%: INTRAMUSCULAR | Qty: 1000 | Status: AC

## 2018-01-08 MED FILL — Lidocaine HCl Local Inj 1%: INTRAMUSCULAR | Qty: 20 | Status: AC

## 2018-01-09 ENCOUNTER — Telehealth: Payer: Self-pay | Admitting: Cardiology

## 2018-01-09 NOTE — Telephone Encounter (Signed)
Returned a call from the patient.  He had some bruising post cath yesterday but has no drainage or swelling or bleeding or pain.  Otherwise feels OK.  No change in therapy.

## 2018-01-14 LAB — CUP PACEART REMOTE DEVICE CHECK
Brady Statistic RA Percent Paced: 49 %
Brady Statistic RV Percent Paced: 0 %
HIGH POWER IMPEDANCE MEASURED VALUE: 50 Ohm
Implantable Lead Location: 753859
Implantable Lead Model: 158
Implantable Lead Model: 5076
Lead Channel Impedance Value: 407 Ohm
Lead Channel Impedance Value: 602 Ohm
Lead Channel Pacing Threshold Amplitude: 0.6 V
Lead Channel Pacing Threshold Amplitude: 0.8 V
Lead Channel Setting Pacing Amplitude: 2 V
MDC IDC LEAD IMPLANT DT: 20060512
MDC IDC LEAD IMPLANT DT: 20060512
MDC IDC LEAD LOCATION: 753860
MDC IDC LEAD SERIAL: 159477
MDC IDC MSMT BATTERY REMAINING LONGEVITY: 90 mo
MDC IDC MSMT BATTERY REMAINING PERCENTAGE: 100 %
MDC IDC MSMT LEADCHNL RA PACING THRESHOLD PULSEWIDTH: 0.4 ms
MDC IDC MSMT LEADCHNL RV PACING THRESHOLD PULSEWIDTH: 0.4 ms
MDC IDC PG IMPLANT DT: 20150123
MDC IDC PG SERIAL: 112776
MDC IDC SESS DTM: 20190225054200
MDC IDC SET LEADCHNL RV PACING AMPLITUDE: 2.4 V
MDC IDC SET LEADCHNL RV PACING PULSEWIDTH: 0.4 ms
MDC IDC SET LEADCHNL RV SENSING SENSITIVITY: 0.6 mV

## 2018-01-28 ENCOUNTER — Other Ambulatory Visit (HOSPITAL_COMMUNITY): Payer: Medicare Other

## 2018-01-28 ENCOUNTER — Encounter: Payer: Medicare Other | Admitting: Internal Medicine

## 2018-02-09 ENCOUNTER — Encounter: Payer: Medicare Other | Admitting: Physician Assistant

## 2018-02-16 DIAGNOSIS — I2510 Atherosclerotic heart disease of native coronary artery without angina pectoris: Secondary | ICD-10-CM | POA: Diagnosis not present

## 2018-02-16 DIAGNOSIS — Z79899 Other long term (current) drug therapy: Secondary | ICD-10-CM | POA: Diagnosis not present

## 2018-02-16 DIAGNOSIS — R7301 Impaired fasting glucose: Secondary | ICD-10-CM | POA: Diagnosis not present

## 2018-02-16 DIAGNOSIS — I10 Essential (primary) hypertension: Secondary | ICD-10-CM | POA: Diagnosis not present

## 2018-02-16 DIAGNOSIS — R635 Abnormal weight gain: Secondary | ICD-10-CM | POA: Diagnosis not present

## 2018-02-16 DIAGNOSIS — R0602 Shortness of breath: Secondary | ICD-10-CM | POA: Diagnosis not present

## 2018-02-16 DIAGNOSIS — G894 Chronic pain syndrome: Secondary | ICD-10-CM | POA: Diagnosis not present

## 2018-02-18 DIAGNOSIS — R0602 Shortness of breath: Secondary | ICD-10-CM | POA: Diagnosis not present

## 2018-02-24 NOTE — Progress Notes (Deleted)
Cardiology Office Note Date:  02/24/2018  Patient ID:  Connor Wilkerson, Connor Wilkerson 03/10/1941, MRN [id number] PCP:  Ernestene Kiel, MD  Electrophysiologist:  Dr. Caryl Comes  ***refresh   Chief Complaint: ***  History of Present Illness: HAMED DEBELLA is a 77 y.o. male with history of ***   Past Medical History:  Diagnosis Date  . Abnormality of gait 02/07/2015  . Atrial fibrillation ([place])   . Dyspnea   . ICD (implantable cardiac defibrillator)  BSx   . Ischemic cardiomyopathy   . Myocardial infarction ([place])   . Near syncope 09/21/2014  . Peripheral vascular disease [facility])     Past Surgical History:  Procedure Laterality Date  . CARDIAC CATHETERIZATION N/A 12/11/2015   Procedure: Left Heart Cath and Coronary Angiography;  Surgeon: Charolette Forward, MD;  Location: [place] CV LAB;  Service: Cardiovascular;  Laterality: N/A;  . CARDIAC DEFIBRILLATOR PLACEMENT    . CARDIAC DEFIBRILLATOR PLACEMENT  04/08/2016   NOT MRI SAFE (DOCUMENT SCANNED IN SYSTEM  . CORONARY ARTERY BYPASS GRAFT    . ELBOW SURGERY Left    plastic bone replacement  . HERNIA REPAIR     x2  . LEFT HEART CATH AND CORS/GRAFTS ANGIOGRAPHY N/A 01/07/2018   Procedure: LEFT HEART CATH AND CORS/GRAFTS ANGIOGRAPHY;  Surgeon: Burnell Blanks, MD;  Location: [place] CV LAB;  Service: Cardiovascular;  Laterality: N/A;  . PERMANENT PACEMAKER GENERATOR CHANGE N/A 11/25/2013   Procedure: PERMANENT PACEMAKER GENERATOR CHANGE;  Surgeon: Deboraha Sprang, MD;  Location: [facility] CATH LAB;  Service: Cardiovascular;  Laterality: N/A;  . ROTATOR CUFF REPAIR Right 2006  . SINUS EXPLORATION  1987  . ULTRASOUND GUIDANCE FOR VASCULAR ACCESS  01/07/2018   Procedure: Ultrasound Guidance For Vascular Access;  Surgeon: Burnell Blanks, MD;  Location: [place] CV LAB;  Service: Cardiovascular;;    Current Outpatient Medications  Medication Sig Dispense Refill  . acetaminophen (TYLENOL) 500 MG tablet Take 1,000 mg by mouth every 6  (six) hours as needed for moderate pain or headache.    Marland Kitchen amLODipine (NORVASC) 5 MG tablet Take 1 tablet (5 mg total) by mouth daily. 30 tablet 7  . aspirin 81 MG tablet Take 81 mg by mouth daily.     Marland Kitchen buPROPion (WELLBUTRIN XL) 300 MG 24 hr tablet Take 300 mg by mouth at bedtime.     . carvedilol (COREG) 6.25 MG tablet TAKE 1 TABLET (6.25 MG TOTAL) BY MOUTH 2 (TWO) TIMES DAILY. 60 tablet 8  . clopidogrel (PLAVIX) 75 MG tablet Take 1 tablet (75 mg total) by mouth at bedtime. 90 tablet 3  . colchicine 0.6 MG tablet Take 0.6 mg by mouth daily.     . cyclobenzaprine (FLEXERIL) 5 MG tablet Take 5 mg by mouth 3 (three) times daily as needed for muscle spasms.     Marland Kitchen escitalopram (LEXAPRO) 10 MG tablet Take 10 mg by mouth at bedtime.     . famotidine (PEPCID) 20 MG tablet Take 20 mg by mouth 2 (two) times daily.     Marland Kitchen gabapentin (NEURONTIN) 300 MG capsule Take 600 mg by mouth at bedtime.     . nitroGLYCERIN (NITROSTAT) 0.4 MG SL tablet Place 1 tablet (0.4 mg total) under the tongue every 5 (five) minutes as needed for chest pain. 25 tablet 5  . oxyCODONE-acetaminophen (PERCOCET) 10-325 MG tablet Take 1 tablet by mouth 2 (two) times daily as needed for pain.     . rosuvastatin (CRESTOR) 40 MG tablet TAKE  1 TABLET BY MOUTH AT BEDTIME. REPORTED ON 05/19/2016 (Patient taking differently: TAKE 40 MG BY MOUTH AT BEDTIME) 90 tablet 2  . sacubitril-valsartan (ENTRESTO) 97-103 MG Take 1 tablet by mouth 2 (two) times daily. 60 tablet 11  . spironolactone (ALDACTONE) 25 MG tablet TAKE 1 TABLET (25 MG TOTAL) BY MOUTH DAILY. 30 tablet 8  . traZODone (DESYREL) 50 MG tablet Take 50 mg by mouth at bedtime as needed for sleep.  6   Current Facility-Administered Medications  Medication Dose Route Frequency Provider Last Rate Last Dose  . cloNIDine (CATAPRES) tablet 0.2 mg  0.2 mg Oral Once Deboraha Sprang, MD        Allergies:   Tramadol   Social History:  The patient  reports that he quit smoking about 18 years ago.  He has never used smokeless tobacco. He reports that he does not drink alcohol or use drugs.   Family History:  The patient's family history includes Heart attack in his brother, father, and mother; Heart disease in his brother, father, and mother.   ROS:  Please see the history of present illness.  All other systems are reviewed and otherwise negative.   PHYSICAL EXAM: *** VS:  There were no vitals taken for this visit. BMI: There is no height or weight on file to calculate BMI. Well nourished, well developed, in no acute distress  HEENT: normocephalic, atraumatic  Neck: no JVD, carotid bruits or masses Cardiac:  *** RRR; no significant murmurs, no rubs, or gallops Lungs: *** CTA b/l, no wheezing, rhonchi or rales  Abd: soft, nontender MS: no deformity or *** atrophy Ext: *** no edema  Skin: warm and dry, no rash Neuro:  No gross deficits appreciated Psych: euthymic mood, full affect  *** ICD site is stable, no tethering or discomfort   EKG:  Done today shows ***  Recent Labs: 01/04/2018: BUN 30; Creatinine, Ser 1.57; Hemoglobin 14.2; Platelets 232; Potassium 4.8; Sodium 147  No results found for requested labs within last 8760 hours.   CrCl cannot be calculated (Patient's most recent lab result is older than the maximum 21 days allowed.).   Wt Readings from Last 3 Encounters:  01/07/18 171 lb (77.6 kg)  01/04/18 172 lb 3.2 oz (78.1 kg)  12/14/17 172 lb 14.4 oz (78.4 kg)     Other studies reviewed: Additional studies/records reviewed today include: summarized above  ASSESSMENT AND PLAN:  1. ***  Disposition: F/u with ***  Current medicines are reviewed at length with the patient today.  The patient did not have any concerns regarding medicines.***  Signed, Tommye Standard, PA-C 02/24/2018 6:42 AM     [facility] [address] [place] [address] [phone] (office)  [phone] (fax)

## 2018-02-25 ENCOUNTER — Encounter: Payer: Medicare Other | Admitting: Physician Assistant

## 2018-03-02 DIAGNOSIS — R748 Abnormal levels of other serum enzymes: Secondary | ICD-10-CM | POA: Diagnosis not present

## 2018-03-02 DIAGNOSIS — K7460 Unspecified cirrhosis of liver: Secondary | ICD-10-CM | POA: Diagnosis not present

## 2018-03-10 ENCOUNTER — Encounter: Payer: Self-pay | Admitting: Gastroenterology

## 2018-03-25 DIAGNOSIS — R748 Abnormal levels of other serum enzymes: Secondary | ICD-10-CM | POA: Diagnosis not present

## 2018-03-25 DIAGNOSIS — L853 Xerosis cutis: Secondary | ICD-10-CM | POA: Diagnosis not present

## 2018-03-25 DIAGNOSIS — K7460 Unspecified cirrhosis of liver: Secondary | ICD-10-CM | POA: Diagnosis not present

## 2018-03-30 ENCOUNTER — Ambulatory Visit (INDEPENDENT_AMBULATORY_CARE_PROVIDER_SITE_OTHER): Payer: Medicare Other | Admitting: *Deleted

## 2018-03-30 DIAGNOSIS — I255 Ischemic cardiomyopathy: Secondary | ICD-10-CM

## 2018-03-30 NOTE — Progress Notes (Signed)
Remote ICD transmission.   

## 2018-04-01 LAB — CUP PACEART REMOTE DEVICE CHECK
Battery Remaining Longevity: 90 mo
Date Time Interrogation Session: 20190527044000
HighPow Impedance: 47 Ohm
Implantable Lead Implant Date: 20060512
Implantable Lead Implant Date: 20060512
Implantable Lead Location: 753860
Implantable Lead Serial Number: 159477
Implantable Pulse Generator Implant Date: 20150123
Lead Channel Pacing Threshold Pulse Width: 0.4 ms
Lead Channel Pacing Threshold Pulse Width: 0.4 ms
Lead Channel Setting Pacing Amplitude: 2 V
Lead Channel Setting Pacing Pulse Width: 0.4 ms
Lead Channel Setting Sensing Sensitivity: 0.6 mV
MDC IDC LEAD LOCATION: 753859
MDC IDC MSMT BATTERY REMAINING PERCENTAGE: 100 %
MDC IDC MSMT LEADCHNL RA IMPEDANCE VALUE: 393 Ohm
MDC IDC MSMT LEADCHNL RA PACING THRESHOLD AMPLITUDE: 0.7 V
MDC IDC MSMT LEADCHNL RV IMPEDANCE VALUE: 508 Ohm
MDC IDC MSMT LEADCHNL RV PACING THRESHOLD AMPLITUDE: 0.9 V
MDC IDC SET LEADCHNL RV PACING AMPLITUDE: 2.4 V
MDC IDC STAT BRADY RA PERCENT PACED: 46 %
MDC IDC STAT BRADY RV PERCENT PACED: 0 %
Pulse Gen Serial Number: 112776

## 2018-04-02 ENCOUNTER — Encounter: Payer: Self-pay | Admitting: Cardiology

## 2018-04-05 NOTE — Progress Notes (Signed)
Cardiology Office Note Date:  04/05/2018  Patient ID:  Connor Wilkerson, Connor Wilkerson 1940-12-27, MRN [id number] PCP:  Ernestene Kiel, MD  Cardiologist: Electrophysiologist: Dr. Caryl Comes   Chief Complaint:  Planned f/u  History of Present Illness: Connor Wilkerson is a 77 y.o. male with history of CAD/CABG, ICM with ICD, chronic CHF, HTN, HLD, CRI (stage III),  ?PAFib, PVD with vascular non-obstructive carotid disease (cotniues to follow with Dr. Trula Slade), orthostatic changes, chronic back pain and arthritic pain.  He was seen by myself in May 2017 described symptoms concerning for neuro etiology recommended to revisit with neuro, vascular, or ER if needed. He did see neurology who felt hypotension was contributing and his Norvasc stopped and arranged for further testing.  He did though seek attention at the hospital early June for recurrent dizziness, falls noted to be hypotensive, with acute/chronic renal failure and hyperkalemic, dehydration, his medicines adjusted and discharged, not felt to have acute neuro component.    Saw again by myself in Sept, at that time he had seen vascular with further evaluation of his carotid disease in process at that time, though with renal disease angio was not undertaken and vascular recommendations were to monitor clinical symptoms and surveillence [place] of his carotids feeling hypotension was the etiology of the event, he saw neuro who added gabapentin for RLS and PRN f/u planned with them.  He had known hx of orthostatic dizziness and syncope, reports since the medicines were stopped at his hospital stay he reported feeling like a new person.    Most recently he saw Dr. Caryl Comes in March 2019 with progressive DOE, worsening exercise intolerance and referred for cath which was done noting stable CAD, unchanged from prior cath.  He reports doing well.  His exertional intolerance is limited to times when he is out with the dogs throwing the ball and then at times having to  walk to get it back from the dogs, this he can tolerate for about 15 minutes before he has to take a break, this is about where it has been for about a year.  Otherwise her reports able to do all of his ADL's without difficulty, denies any rest SOB, no symptoms of PND or orthopnea.  No CP, palpitations, no dizziness, near syncope or syncope.  He denies any pain or complications at cath site.     Device information:  BSCi dual chamber ICD, implanted 03/14/05, gen change 11/25/13 to sub-pectoral placement, Dr. Caryl Comes    Past Medical History:  Diagnosis Date  . Abnormality of gait 02/07/2015  . Atrial fibrillation ([place])   . Dyspnea   . ICD (implantable cardiac defibrillator)  BSx   . Ischemic cardiomyopathy   . Myocardial infarction ([place])   . Near syncope 09/21/2014  . Peripheral vascular disease [facility])     Past Surgical History:  Procedure Laterality Date  . CARDIAC CATHETERIZATION N/A 12/11/2015   Procedure: Left Heart Cath and Coronary Angiography;  Surgeon: Charolette Forward, MD;  Location: [facility];  Service: Cardiovascular;  Laterality: N/A;  . CARDIAC DEFIBRILLATOR PLACEMENT    . CARDIAC DEFIBRILLATOR PLACEMENT  04/08/2016   NOT MRI SAFE (DOCUMENT SCANNED IN SYSTEM  . CORONARY ARTERY BYPASS GRAFT    . ELBOW SURGERY Left    plastic bone replacement  . HERNIA REPAIR     x2  . LEFT HEART CATH AND CORS/GRAFTS ANGIOGRAPHY N/A 01/07/2018   Procedure: LEFT HEART CATH AND CORS/GRAFTS ANGIOGRAPHY;  Surgeon: Burnell Blanks, MD;  Location: [place] [facility];  Service: Cardiovascular;  Laterality: N/A;  . PERMANENT PACEMAKER GENERATOR CHANGE N/A 11/25/2013   Procedure: PERMANENT PACEMAKER GENERATOR CHANGE;  Surgeon: Deboraha Sprang, MD;  Location: [facility] CATH LAB;  Service: Cardiovascular;  Laterality: N/A;  . ROTATOR CUFF REPAIR Right 2006  . SINUS EXPLORATION  1987  . ULTRASOUND GUIDANCE FOR VASCULAR ACCESS  01/07/2018   Procedure: Ultrasound Guidance For Vascular Access;  Surgeon:  Burnell Blanks, MD;  Location: [place] [facility];  Service: Cardiovascular;;    Current Outpatient Medications  Medication Sig Dispense Refill  . acetaminophen (TYLENOL) 500 MG tablet Take 1,000 mg by mouth every 6 (six) hours as needed for moderate pain or headache.    Marland Kitchen amLODipine (NORVASC) 5 MG tablet Take 1 tablet (5 mg total) by mouth daily. 30 tablet 7  . aspirin 81 MG tablet Take 81 mg by mouth daily.     Marland Kitchen buPROPion (WELLBUTRIN XL) 300 MG 24 hr tablet Take 300 mg by mouth at bedtime.     . carvedilol (COREG) 6.25 MG tablet TAKE 1 TABLET (6.25 MG TOTAL) BY MOUTH 2 (TWO) TIMES DAILY. 60 tablet 8  . clopidogrel (PLAVIX) 75 MG tablet Take 1 tablet (75 mg total) by mouth at bedtime. 90 tablet 3  . colchicine 0.6 MG tablet Take 0.6 mg by mouth daily.     . cyclobenzaprine (FLEXERIL) 5 MG tablet Take 5 mg by mouth 3 (three) times daily as needed for muscle spasms.     Marland Kitchen escitalopram (LEXAPRO) 10 MG tablet Take 10 mg by mouth at bedtime.     . famotidine (PEPCID) 20 MG tablet Take 20 mg by mouth 2 (two) times daily.     Marland Kitchen gabapentin (NEURONTIN) 300 MG capsule Take 600 mg by mouth at bedtime.     . nitroGLYCERIN (NITROSTAT) 0.4 MG SL tablet Place 1 tablet (0.4 mg total) under the tongue every 5 (five) minutes as needed for chest pain. 25 tablet 5  . oxyCODONE-acetaminophen (PERCOCET) 10-325 MG tablet Take 1 tablet by mouth 2 (two) times daily as needed for pain.     . rosuvastatin (CRESTOR) 40 MG tablet TAKE 1 TABLET BY MOUTH AT BEDTIME. REPORTED ON 05/19/2016 (Patient taking differently: TAKE 40 MG BY MOUTH AT BEDTIME) 90 tablet 2  . sacubitril-valsartan (ENTRESTO) 97-103 MG Take 1 tablet by mouth 2 (two) times daily. 60 tablet 11  . spironolactone (ALDACTONE) 25 MG tablet TAKE 1 TABLET (25 MG TOTAL) BY MOUTH DAILY. 30 tablet 8  . traZODone (DESYREL) 50 MG tablet Take 50 mg by mouth at bedtime as needed for sleep.  6   Current Facility-Administered Medications  Medication Dose Route  Frequency Provider Last Rate Last Dose  . cloNIDine (CATAPRES) tablet 0.2 mg  0.2 mg Oral Once Deboraha Sprang, MD        Allergies:   Tramadol   Social History:  The patient  reports that he quit smoking about 18 years ago. He has never used smokeless tobacco. He reports that he does not drink alcohol or use drugs.   Family History:  The patient's family history includes Heart attack in his brother, father, and mother; Heart disease in his brother, father, and mother.  ROS:  Please see the history of present illness.    All other systems are reviewed and otherwise negative.   PHYSICAL EXAM:  VS:  There were no vitals taken for this visit. BMI: There is no height or weight on file  to calculate BMI. Well nourished, well developed, in no acute distress  HEENT: normocephalic, atraumatic  Neck: no JVD, carotid bruits or masses Cardiac:    RRR; no significant murmurs, no rubs, or gallops Lungs:  CTA b/l, no wheezing, rhonchi or rales  Abd: soft, nontender MS: no deformity, age appropriate atrophy Ext: trace if any edema, R groin is soft, non-tender Skin: warm and dry, no rash Neuro:  No gross deficits are appreciated, speech is clear Psych: euthymic mood, full affect  CD site (sub-pec) is stable, no tethering or discomfort   EKG:  Done 04/11/16 was A paced, V sensed ICD check today and reviewed by myself: battery and lead measurements are good 48% AP, <1% VP, AT/AF episodes are 1:1, no true AF, longest 7 seconds  01/07/18: LHC  Prox RCA lesion is 100% stenosed.  SVG graft was visualized by angiography and is normal in caliber.  Post Atrio lesion is 60% stenosed.  Prox Cx lesion is 100% stenosed.  SVG graft was visualized by angiography and is normal in caliber.  LIMA graft was visualized by angiography and is normal in caliber.  Ost LAD lesion is 40% stenosed.  Prox LAD lesion is 40% stenosed.  Prox LAD to Mid LAD lesion is 90% stenosed.   1. Severe triple vessel CAD s/p  3V CABG with 3/3 patent bypass grafts 2. Severe stenosis mid LAD. The mid and distal LAD fills from the LIMA graft 3. The mid Circumflex is chronically occluded. The OM branch fills from the patent vein graft 4. The RCA is chronically occluded just beyond the ostium. The distal RCA and PDA fills from the patent vein graft. The native PDA has moderate stenosis beyond the graft insertion which is unchanged from cardiac cath in 2017.   Recommendations: Continue medical management of CAD.   12/30/17: TTE Study Conclusions - Left ventricle: The cavity size was normal. Wall thickness was   increased in a pattern of mild LVH. Systolic function was   moderately to severely reduced. The estimated ejection fraction   was in the range of 30% to 35%. Diffuse hypokinesis. Doppler   parameters are consistent with abnormal left ventricular   relaxation (grade 1 diastolic dysfunction). - Right ventricle: Systolic function was mildly reduced.  12/11/15 LHC Conclusion     Ost RCA to Mid RCA lesion, 100% stenosed.  Ost RPDA lesion, 70% stenosed.  Ost Cx lesion, 60% stenosed.  Prox Cx lesion, 80% stenosed.  Prox Cx to Mid Cx lesion, 100% stenosed.  Ost LM lesion, 40% stenosed.  Prox LAD lesion, 40% stenosed.  Mid LAD-1 lesion, 70% stenosed.  Mid LAD-2 lesion, 100% stenosed.  Findings Left main is 30-40% diffuse disease LAD has 40-50% proximal and 70% mid stenosis and then 100% occluded filling by LIMA to LAD Left circumflex and 50-60% ostial stenosis and 80% proximal stenosis and then 100% occluded beyond the midportion. OM1 is very small is patent RCA is 100% occluded at the ostium Saphenous vein graft to PDA has 70-80% anastomotic stenosis as before at the trifurcation not suitable for PCI Saphenous vein graft to obtuse marginal was patent LIMA to LAD is patent   10/11/13: Echocardiogram Study Conclusions - Left ventricle: Moderate hypokinesis of base/mid inferior wall and base/mid  inferolateral wall. The cavity size was normal. Wall thickness was normal. The estimated ejection fraction was 45%. - Left atrium: The atrium was mildly dilated. - Right ventricle: The cavity size was normal. Pacer wire or catheter noted in right ventricle. Systolic  function was mildly reduced.   Recent Labs: 01/04/2018: BUN 30; Creatinine, Ser 1.57; Hemoglobin 14.2; Platelets 232; Potassium 4.8; Sodium 147  No results found for requested labs within last 8760 hours.   CrCl cannot be calculated (Patient's most recent lab result is older than the maximum 21 days allowed.).   Wt Readings from Last 3 Encounters:  01/07/18 171 lb (77.6 kg)  01/04/18 172 lb 3.2 oz (78.1 kg)  12/14/17 172 lb 14.4 oz (78.4 kg)     Other studies reviewed: Additional studies/records reviewed today include: summarized above   ASSESSMENT AND PLAN:  1. ICM/ICD     intact device function, no changes made  2. CHF    exam is euvolemic    On BB/ARB (Entresto)  3. CAD     S/p cath in March, as above, no intervention     On ASA/plavix, BB, statin, nitrate     No site complications  The patient reports his PMD did labs last week in [place], reported his kidney function was "good"  4. PAFib, older record notes report as a single event 4 minutes duration Aug 2015     Continue to monitor via device     None by device check today.     5. Carotid disease     Dr. Trula Slade  6.  Orthostatic history       Not an ongoing c/o       Advised to stand slowly and not walk away from his chair right away, avoid long periods of standing as well  7. HTN     looks OK, no changes   Disposition:continue Q 3 mo remote checks and see him in-clinic in 6 mo, sooner if needed.   Current medicines are reviewed at length with the patient today.  The patient did not have any concerns regarding medicines.  Haywood Lasso, PA-C 04/05/2018 10:25 AM     [place] [address] [address] [phone] (office)  [phone] (fax)

## 2018-04-06 ENCOUNTER — Ambulatory Visit (INDEPENDENT_AMBULATORY_CARE_PROVIDER_SITE_OTHER): Payer: Medicare Other | Admitting: Physician Assistant

## 2018-04-06 VITALS — BP 134/88 | HR 88 | Ht 70.0 in | Wt 168.0 lb

## 2018-04-06 DIAGNOSIS — I5022 Chronic systolic (congestive) heart failure: Secondary | ICD-10-CM | POA: Diagnosis not present

## 2018-04-06 DIAGNOSIS — Z95810 Presence of automatic (implantable) cardiac defibrillator: Secondary | ICD-10-CM

## 2018-04-06 DIAGNOSIS — I10 Essential (primary) hypertension: Secondary | ICD-10-CM

## 2018-04-06 DIAGNOSIS — I2510 Atherosclerotic heart disease of native coronary artery without angina pectoris: Secondary | ICD-10-CM | POA: Diagnosis not present

## 2018-04-06 DIAGNOSIS — I255 Ischemic cardiomyopathy: Secondary | ICD-10-CM

## 2018-04-06 NOTE — Patient Instructions (Addendum)
Medication Instructions:   Your physician recommends that you continue on your current medications as directed. Please refer to the Current Medication list given to you today.   If you need a refill on your cardiac medications before your next appointment, please call your pharmacy.  Labwork: NONE ORDERED  TODAY    Testing/Procedures: NONE ORDERED  TODAY   Follow-Up: Your physician wants you to follow-up in:  IN 6   MONTHS WITH DR KLEIN/ Charlcie Cradle You will receive a reminder letter in the mail two months in advance. If you don't receive a letter, please call our office to schedule the follow-up appointment.       Remote monitoring is used to monitor your Pacemaker of ICD from home. This monitoring reduces the number of office visits required to check your device to one time per year. It allows [place] to keep an eye on the functioning of your device to ensure it is working properly. You are scheduled for a device check from home on .  06-23-18  You may send your transmission at any time that day. If you have a wireless device, the transmission will be sent automatically. After your physician reviews your transmission, you will receive a postcard with your next transmission date.     Any Other Special Instructions Will Be Listed Below (If Applicable).

## 2018-04-14 ENCOUNTER — Encounter: Payer: Self-pay | Admitting: Gastroenterology

## 2018-04-16 ENCOUNTER — Ambulatory Visit: Payer: Medicare Other | Admitting: Gastroenterology

## 2018-06-03 DIAGNOSIS — R233 Spontaneous ecchymoses: Secondary | ICD-10-CM | POA: Diagnosis not present

## 2018-06-03 DIAGNOSIS — L209 Atopic dermatitis, unspecified: Secondary | ICD-10-CM | POA: Diagnosis not present

## 2018-06-03 DIAGNOSIS — L570 Actinic keratosis: Secondary | ICD-10-CM | POA: Diagnosis not present

## 2018-06-07 DIAGNOSIS — D518 Other vitamin B12 deficiency anemias: Secondary | ICD-10-CM | POA: Diagnosis not present

## 2018-06-07 DIAGNOSIS — K219 Gastro-esophageal reflux disease without esophagitis: Secondary | ICD-10-CM | POA: Diagnosis not present

## 2018-06-07 DIAGNOSIS — K2270 Barrett's esophagus without dysplasia: Secondary | ICD-10-CM | POA: Diagnosis not present

## 2018-06-08 ENCOUNTER — Telehealth: Payer: Self-pay | Admitting: Internal Medicine

## 2018-06-08 NOTE — Telephone Encounter (Signed)
° °  [facility] Pre-operative Risk Assessment    Request for surgical clearance:  1. What type of surgery is being performed?  EGD, Colonoscopy  2. When is this surgery scheduled?  06/25/2018  3. What type of clearance is required (medical clearance vs. Pharmacy clearance to hold med vs. Both)? Both   4. Are there any medications that need to be held prior to surgery and how long? Aspirin and plavix to stop on 06/20/2018  5. Practice name and name of physician performing surgery? [facility] Dr. Christia Reading Misenheimer  6. What is your office phone number [phone]   7.   What is your office fax number [phone]  8.   Anesthesia type (None, local, MAC, general) ? Not listed, Dr. Lyda Jester would like to know what Dr. Caryl Comes would like to do concerning the defibrillator during the procedures?   Connor Wilkerson 06/08/2018, 12:29 PM  _________________________________________________________________   (provider comments below)

## 2018-06-10 DIAGNOSIS — D510 Vitamin B12 deficiency anemia due to intrinsic factor deficiency: Secondary | ICD-10-CM | POA: Diagnosis not present

## 2018-06-11 NOTE — Telephone Encounter (Signed)
Dr. Caryl Comes, this pt is planned for EGD, colonoscopy on 8/23. Can his aspirin and plavix be held for 5 days prior?  I called the pt and he has been having no new cardiac complaints since last visit.   Please route response back to CV DIV PREOP  Thanks, Gae Bon

## 2018-06-13 NOTE — Telephone Encounter (Signed)
Yes  thx

## 2018-06-14 NOTE — Telephone Encounter (Signed)
Routing to requesting provider Dr. Olin Pia recommendation.  Burtis Junes, RN, [facility] [address] [place], [place]  [address] [phone]

## 2018-06-15 ENCOUNTER — Telehealth: Payer: Self-pay | Admitting: Internal Medicine

## 2018-06-15 NOTE — Telephone Encounter (Signed)
New Message:     [facility] is calling to inquire on what was sent to them. They are not sure if that was the clearance for the pt or something else. 6. What is your office phone number [phone]   7.   What is your office fax number [phone]

## 2018-06-16 NOTE — Telephone Encounter (Signed)
   Primary Cardiologist: Dr Caryl Comes  Chart reviewed and patient interviewed today as part of pre-operative protocol coverage. Given past medical history and based on ACC/AHA guidelines, Connor Wilkerson would be at acceptable risk for the planned procedure without further cardiovascular testing.   OK to hold ASA and Plavix 5 days pre op.   I will route this recommendation to the requesting party via Epic fax function and remove from pre-op pool.  Please call with questions.  Kerin Ransom, PA-C 06/16/2018, 12:47 PM

## 2018-06-17 ENCOUNTER — Telehealth: Payer: Self-pay | Admitting: Internal Medicine

## 2018-06-17 NOTE — Telephone Encounter (Signed)
New Message   Otila Kluver is calling from [facility] in reference to clearance. She states that question about holding prescriptions was addressed. But the question about what should they do concerning the defibrillator during the procedure was not addressed. Please advise          [facility] Pre-operative Risk Assessment    Request for surgical clearance:  1. What type of surgery is being performed?  EGD, Colonoscopy  2. When is this surgery scheduled?  06/25/2018  3. What type of clearance is required (medical clearance vs. Pharmacy clearance to hold med vs. Both)? Both   4. Are there any medications that need to be held prior to surgery and how long? Aspirin and Plavix to stop on 06/20/2018  5. Practice name and name of physician performing surgery? [facility] Dr. Christia Reading Misenheimer  6. What is your office phone number [phone]   7.   What is your office fax number [phone]  8.   Anesthesia type (None, local, MAC, general) ? Not listed, Dr. Lyda Jester would like to know what Dr. Caryl Comes would like to do concerning the defibrillator during the procedures?   Derl Barrow 06/08/2018, 12:29 PM

## 2018-06-20 NOTE — Telephone Encounter (Signed)
Pt is DR [facility]   Dr [facility] can you address this with them please

## 2018-06-22 NOTE — Telephone Encounter (Signed)
Spoke with Santiago Glad at [facility] informed her that she would need to place a magnet over pts ICD she voiced understanding

## 2018-06-22 NOTE — Telephone Encounter (Signed)
Routing to the device clinic for their input regarding ICD.   Burtis Junes, RN, [place] [address] [place],   [address] [phone]

## 2018-06-24 DIAGNOSIS — I10 Essential (primary) hypertension: Secondary | ICD-10-CM | POA: Diagnosis not present

## 2018-06-24 DIAGNOSIS — I2510 Atherosclerotic heart disease of native coronary artery without angina pectoris: Secondary | ICD-10-CM | POA: Diagnosis not present

## 2018-06-24 DIAGNOSIS — Z6825 Body mass index (BMI) 25.0-25.9, adult: Secondary | ICD-10-CM | POA: Diagnosis not present

## 2018-06-24 DIAGNOSIS — K7460 Unspecified cirrhosis of liver: Secondary | ICD-10-CM | POA: Diagnosis not present

## 2018-06-24 DIAGNOSIS — E663 Overweight: Secondary | ICD-10-CM | POA: Diagnosis not present

## 2018-06-24 DIAGNOSIS — R7301 Impaired fasting glucose: Secondary | ICD-10-CM | POA: Diagnosis not present

## 2018-06-24 DIAGNOSIS — Z79899 Other long term (current) drug therapy: Secondary | ICD-10-CM | POA: Diagnosis not present

## 2018-06-25 DIAGNOSIS — Z95810 Presence of automatic (implantable) cardiac defibrillator: Secondary | ICD-10-CM | POA: Diagnosis not present

## 2018-06-25 DIAGNOSIS — Z1211 Encounter for screening for malignant neoplasm of colon: Secondary | ICD-10-CM | POA: Diagnosis not present

## 2018-06-25 DIAGNOSIS — Z7982 Long term (current) use of aspirin: Secondary | ICD-10-CM | POA: Diagnosis not present

## 2018-06-25 DIAGNOSIS — K2270 Barrett's esophagus without dysplasia: Secondary | ICD-10-CM | POA: Diagnosis not present

## 2018-06-25 DIAGNOSIS — Z87891 Personal history of nicotine dependence: Secondary | ICD-10-CM | POA: Diagnosis not present

## 2018-06-25 DIAGNOSIS — K2970 Gastritis, unspecified, without bleeding: Secondary | ICD-10-CM | POA: Diagnosis not present

## 2018-06-25 DIAGNOSIS — D649 Anemia, unspecified: Secondary | ICD-10-CM | POA: Diagnosis not present

## 2018-06-25 DIAGNOSIS — F329 Major depressive disorder, single episode, unspecified: Secondary | ICD-10-CM | POA: Diagnosis not present

## 2018-06-25 DIAGNOSIS — I10 Essential (primary) hypertension: Secondary | ICD-10-CM | POA: Diagnosis not present

## 2018-06-25 DIAGNOSIS — Z951 Presence of aortocoronary bypass graft: Secondary | ICD-10-CM | POA: Diagnosis not present

## 2018-06-25 DIAGNOSIS — D509 Iron deficiency anemia, unspecified: Secondary | ICD-10-CM | POA: Diagnosis not present

## 2018-06-25 DIAGNOSIS — E538 Deficiency of other specified B group vitamins: Secondary | ICD-10-CM | POA: Diagnosis not present

## 2018-06-25 DIAGNOSIS — D126 Benign neoplasm of colon, unspecified: Secondary | ICD-10-CM | POA: Diagnosis not present

## 2018-06-25 DIAGNOSIS — J45909 Unspecified asthma, uncomplicated: Secondary | ICD-10-CM | POA: Diagnosis not present

## 2018-06-25 DIAGNOSIS — K5730 Diverticulosis of large intestine without perforation or abscess without bleeding: Secondary | ICD-10-CM | POA: Diagnosis not present

## 2018-06-25 DIAGNOSIS — K644 Residual hemorrhoidal skin tags: Secondary | ICD-10-CM | POA: Diagnosis not present

## 2018-06-25 DIAGNOSIS — K219 Gastro-esophageal reflux disease without esophagitis: Secondary | ICD-10-CM | POA: Diagnosis not present

## 2018-06-25 DIAGNOSIS — Z7902 Long term (current) use of antithrombotics/antiplatelets: Secondary | ICD-10-CM | POA: Diagnosis not present

## 2018-06-25 DIAGNOSIS — I252 Old myocardial infarction: Secondary | ICD-10-CM | POA: Diagnosis not present

## 2018-06-25 DIAGNOSIS — K648 Other hemorrhoids: Secondary | ICD-10-CM | POA: Diagnosis not present

## 2018-06-25 DIAGNOSIS — D124 Benign neoplasm of descending colon: Secondary | ICD-10-CM | POA: Diagnosis not present

## 2018-06-25 DIAGNOSIS — M1990 Unspecified osteoarthritis, unspecified site: Secondary | ICD-10-CM | POA: Diagnosis not present

## 2018-06-25 DIAGNOSIS — Z79899 Other long term (current) drug therapy: Secondary | ICD-10-CM | POA: Diagnosis not present

## 2018-06-29 ENCOUNTER — Ambulatory Visit (INDEPENDENT_AMBULATORY_CARE_PROVIDER_SITE_OTHER): Payer: Medicare Other | Admitting: *Deleted

## 2018-06-29 DIAGNOSIS — I255 Ischemic cardiomyopathy: Secondary | ICD-10-CM

## 2018-06-29 NOTE — Progress Notes (Signed)
Remote ICD transmission.   

## 2018-07-01 ENCOUNTER — Encounter: Payer: Self-pay | Admitting: Cardiology

## 2018-07-01 NOTE — Progress Notes (Signed)
Letter  

## 2018-07-23 LAB — CUP PACEART REMOTE DEVICE CHECK
Date Time Interrogation Session: 20190920123736
Implantable Lead Implant Date: 20060512
Implantable Lead Location: 753859
Implantable Lead Model: 158
Implantable Lead Model: 5076
MDC IDC LEAD IMPLANT DT: 20060512
MDC IDC LEAD LOCATION: 753860
MDC IDC LEAD SERIAL: 159477
MDC IDC PG IMPLANT DT: 20150123
MDC IDC PG SERIAL: 112776

## 2018-07-30 ENCOUNTER — Telehealth: Payer: Self-pay | Admitting: Cardiovascular Disease

## 2018-07-30 NOTE — Telephone Encounter (Signed)
New Message:    Pt c/o medication issue:  1. Name of Medication: spironolactone (ALDACTONE) 25 MG tablet  2. How are you currently taking this medication (dosage and times per day)? TAKE 1 TABLET (25 MG TOTAL) BY MOUTH DAILY.  3. Are you having a reaction (difficulty breathing--STAT)? No   4. What is your medication issue? Patient stated the medication is causing him to sleep more than he normally does

## 2018-07-30 NOTE — Telephone Encounter (Signed)
Spoke with pts wife who is concerned that her husband has been feeling drowsy and a little dizzy from time to time. We reviewed his medications together. She states he has been sitting in the sun often and is not a good water drinker. She questioned if the spironolactone could be the culprit of his sx, but pt has been on this medication for some time and this is unlikely. I have encouraged pts wife to monitor pt's blood pressure. If his systolic is <138 before his morning medications, he should hold his Amlodipine and Clonidine. If his pressure is normal, he is to take medications as usual. If/when he becomes symptomatic, record pt's BP at that time. If it is low, encourage pt to hydrate. Pt's wife will call me on Monday and let me know how pt's BP was over the weekend. Pt's wife has agreed with plan and verbalized understanding.

## 2018-09-13 ENCOUNTER — Ambulatory Visit: Payer: Medicare Other | Admitting: Family

## 2018-09-13 ENCOUNTER — Ambulatory Visit (HOSPITAL_COMMUNITY): Payer: Medicare Other

## 2018-09-17 ENCOUNTER — Other Ambulatory Visit: Payer: Self-pay

## 2018-09-28 ENCOUNTER — Ambulatory Visit (INDEPENDENT_AMBULATORY_CARE_PROVIDER_SITE_OTHER): Payer: Medicare Other

## 2018-09-28 DIAGNOSIS — I255 Ischemic cardiomyopathy: Secondary | ICD-10-CM

## 2018-09-28 NOTE — Progress Notes (Signed)
Remote ICD transmission.   

## 2018-10-08 ENCOUNTER — Encounter: Payer: Medicare Other | Admitting: Physician Assistant

## 2018-10-12 DIAGNOSIS — K7460 Unspecified cirrhosis of liver: Secondary | ICD-10-CM | POA: Diagnosis not present

## 2018-10-12 DIAGNOSIS — M79642 Pain in left hand: Secondary | ICD-10-CM | POA: Diagnosis not present

## 2018-10-12 DIAGNOSIS — Z79899 Other long term (current) drug therapy: Secondary | ICD-10-CM | POA: Diagnosis not present

## 2018-10-12 DIAGNOSIS — I2510 Atherosclerotic heart disease of native coronary artery without angina pectoris: Secondary | ICD-10-CM | POA: Diagnosis not present

## 2018-10-12 DIAGNOSIS — I10 Essential (primary) hypertension: Secondary | ICD-10-CM | POA: Diagnosis not present

## 2018-10-12 DIAGNOSIS — E785 Hyperlipidemia, unspecified: Secondary | ICD-10-CM | POA: Diagnosis not present

## 2018-10-12 DIAGNOSIS — Z6825 Body mass index (BMI) 25.0-25.9, adult: Secondary | ICD-10-CM | POA: Diagnosis not present

## 2018-10-12 DIAGNOSIS — R7301 Impaired fasting glucose: Secondary | ICD-10-CM | POA: Diagnosis not present

## 2018-10-17 NOTE — Progress Notes (Signed)
Cardiology Office Note Date:  10/17/2018  Patient ID:  Connor Wilkerson, Connor Wilkerson Feb 11, 1941, MRN [id number] PCP:  Ernestene Kiel, MD  Cardiologist: Electrophysiologist: Dr. Caryl Comes   Chief Complaint:  Planned f/u  History of Present Illness: Connor Wilkerson is a 77 y.o. male with history of CAD/CABG, ICM with ICD, chronic CHF, HTN, HLD, CRI (stage III),  ?PAFib, PVD with vascular non-obstructive carotid disease (cotniues to follow with Dr. Trula Slade), orthostatic changes, chronic back pain and arthritic pain.  He was seen by myself in May 2017 described symptoms concerning for neuro etiology recommended to revisit with neuro, vascular, or ER if needed. He did see neurology who felt hypotension was contributing and his Norvasc stopped and arranged for further testing.  He did though seek attention at the hospital early June for recurrent dizziness, falls noted to be hypotensive, with acute/chronic renal failure and hyperkalemic, dehydration, his medicines adjusted and discharged, not felt to have acute neuro component.    Saw again by myself in Sept, at that time he had seen vascular with further evaluation of his carotid disease in process at that time, though with renal disease angio was not undertaken and vascular recommendations were to monitor clinical symptoms and surveillence [place] of his carotids feeling hypotension was the etiology of the event, he saw neuro who added gabapentin for RLS and PRN f/u planned with them.  He had known hx of orthostatic dizziness and syncope, reports since the medicines were stopped at his hospital stay he reported feeling like a new person.    He saw Dr. Caryl Comes in March 2019 with progressive DOE, worsening exercise intolerance and referred for cath which was done noting stable CAD, unchanged from prior cath.  I saw him in f/u in June 2019, he reported doing well.  His exertional intolerance was reported as limited to times when he is out with the dogs throwing the  ball and then at times having to walk to get it back from the dogs, this he can tolerate for about 15 minutes before he has to take a break, this was reported about where it had been for about a year.  Otherwise he reporeds able to do all of his ADL's without difficulty, denies any rest SOB, no symptoms of PND or orthopnea.  No CP, palpitations, no dizziness, near syncope or syncope.  He denied any pain or complications at cath site. No changes were made at his visit  He comes today for a planned 6 mo visit.  He mentions for about 5-6 months 1-2 x a week when he gets up to walk down the hall he feels "swimmy headed" it does notlast long, he tends to feel a little lightheaded when he stands up but waits a few seconds before he walks away.  He hasnot fainted.  Mentions that he can be out with the dogs and feel ok, and in the morning time he feels ok, bit abut 2 hours after his AM meds is when he notes this starts.  No CP, no sensataion of palpitations with or without the dizziness.  He saw his PMD last week but did not discuss this symptom.  He has not fainted.  He does not feel it seated or laying down, does not feel it every day.  He has not fainted  He brings the medicines he takes in the morning thinking it may be one of them.  He brings ASA, spironolactone, colchicine, lexapro, and famotidine.  It seems he is not taking  his coreg or Entresto BID  For hydration he takes a few gulps of water when taking his medicines only, otherwise her drinks pepsi through the day, including with breakfast. He reports home BP typically 160-170/?    Device information:  BSCi dual chamber ICD, implanted 03/14/05, gen change 11/25/13 to sub-pectoral placement, Dr. Caryl Comes    Past Medical History:  Diagnosis Date  . Abnormality of gait 02/07/2015  . Atrial fibrillation ([place])   . CAD (coronary artery disease)   . Dyspnea   . ICD (implantable cardiac defibrillator)  BSx   . Ischemic cardiomyopathy   . Myocardial  infarction ([place])   . Near syncope 09/21/2014  . Peripheral vascular disease [facility])     Past Surgical History:  Procedure Laterality Date  . CARDIAC CATHETERIZATION N/A 12/11/2015   Procedure: Left Heart Cath and Coronary Angiography;  Surgeon: Charolette Forward, MD;  Location: [place] [facility];  Service: Cardiovascular;  Laterality: N/A;  . CARDIAC DEFIBRILLATOR PLACEMENT    . CARDIAC DEFIBRILLATOR PLACEMENT  04/08/2016   NOT MRI SAFE (DOCUMENT SCANNED IN SYSTEM  . COLONOSCOPY  10/06/2007   Mild sigmoid diverticulosis. Small internal hemorrhoids.   . CORONARY ARTERY BYPASS GRAFT    . ELBOW SURGERY Left    plastic bone replacement  . HERNIA REPAIR     x2  . LEFT HEART CATH AND CORS/GRAFTS ANGIOGRAPHY N/A 01/07/2018   Procedure: LEFT HEART CATH AND CORS/GRAFTS ANGIOGRAPHY;  Surgeon: Burnell Blanks, MD;  Location: [place] [facility];  Service: Cardiovascular;  Laterality: N/A;  . PERMANENT PACEMAKER GENERATOR CHANGE N/A 11/25/2013   Procedure: PERMANENT PACEMAKER GENERATOR CHANGE;  Surgeon: Deboraha Sprang, MD;  Location: [facility] C[facility] LAB;  Service: Cardiovascular;  Laterality: N/A;  . ROTATOR CUFF REPAIR Right 2006  . SINUS EXPLORATION  1987  . ULTRASOUND GUIDANCE FOR VASCULAR ACCESS  01/07/2018   Procedure: Ultrasound Guidance For Vascular Access;  Surgeon: Burnell Blanks, MD;  Location: [place] [facility];  Service: Cardiovascular;;    Current Outpatient Medications  Medication Sig Dispense Refill  . acetaminophen (TYLENOL) 500 MG tablet Take 1,000 mg by mouth every 6 (six) hours as needed for moderate pain or headache.    Marland Kitchen amLODipine (NORVASC) 5 MG tablet Take 1 tablet (5 mg total) by mouth daily. 30 tablet 7  . aspirin 81 MG tablet Take 81 mg by mouth daily.     Marland Kitchen buPROPion (WELLBUTRIN XL) 300 MG 24 hr tablet Take 300 mg by mouth at bedtime.     . carvedilol (COREG) 6.25 MG tablet TAKE 1 TABLET (6.25 MG TOTAL) BY MOUTH 2 (TWO) TIMES DAILY. 60 tablet 8  . clopidogrel (PLAVIX) 75  MG tablet Take 1 tablet (75 mg total) by mouth at bedtime. 90 tablet 3  . colchicine 0.6 MG tablet Take 0.6 mg by mouth daily.     . cyclobenzaprine (FLEXERIL) 5 MG tablet Take 5 mg by mouth 3 (three) times daily as needed for muscle spasms.     Marland Kitchen escitalopram (LEXAPRO) 10 MG tablet Take 10 mg by mouth at bedtime.     . famotidine (PEPCID) 20 MG tablet Take 20 mg by mouth 2 (two) times daily.     Marland Kitchen gabapentin (NEURONTIN) 300 MG capsule Take 600 mg by mouth at bedtime.     . nitroGLYCERIN (NITROSTAT) 0.4 MG SL tablet Place 1 tablet (0.4 mg total) under the tongue every 5 (five) minutes as needed for chest pain. 25 tablet 5  . oxyCODONE-acetaminophen (PERCOCET)  10-325 MG tablet Take 1 tablet by mouth 2 (two) times daily as needed for pain.     . rosuvastatin (CRESTOR) 40 MG tablet TAKE 1 TABLET BY MOUTH AT BEDTIME. REPORTED ON 05/19/2016 (Patient taking differently: TAKE 40 MG BY MOUTH AT BEDTIME) 90 tablet 2  . sacubitril-valsartan (ENTRESTO) 97-103 MG Take 1 tablet by mouth 2 (two) times daily. 60 tablet 11  . spironolactone (ALDACTONE) 25 MG tablet TAKE 1 TABLET (25 MG TOTAL) BY MOUTH DAILY. 30 tablet 8  . traZODone (DESYREL) 50 MG tablet Take 50 mg by mouth at bedtime as needed for sleep.  6   Current Facility-Administered Medications  Medication Dose Route Frequency Provider Last Rate Last Dose  . cloNIDine (CATAPRES) tablet 0.2 mg  0.2 mg Oral Once Deboraha Sprang, MD        Allergies:   Tramadol   Social History:  The patient  reports that he quit smoking about 19 years ago. He has never used smokeless tobacco. He reports that he does not drink alcohol or use drugs.   Family History:  The patient's family history includes Heart attack in his brother, father, and mother; Heart disease in his brother, father, and mother.  ROS:  Please see the history of present illness.    All other systems are reviewed and otherwise negative.   PHYSICAL EXAM:  VS:  There were no vitals taken for this  visit. BMI: There is no height or weight on file to calculate BMI. Well nourished, well developed, in no acute distress  HEENT: normocephalic, atraumatic  Neck: no JVD, carotid bruits or masses Cardiac:    RRR; no significant murmurs, no rubs, or gallops Lungs:  CTA b/l, no wheezing, rhonchi or rales  Abd: soft, nontender MS: no deformity, age appropriate atrophy Ext: no edema, R groin is soft, non-tender Skin: warm and dry, no rash Neuro:  No gross deficits are appreciated, speech is clear Psych: euthymic mood, full affect  ICD site (sub-pec) is stable, no tethering or discomfort   EKG:  Done 04/11/16 was A paced, V sensed ICD check today and reviewed by myself: battery and lead measurements are good.  NSVT only, one false AF episode  01/07/18: LHC  Prox RCA lesion is 100% stenosed.  SVG graft was visualized by angiography and is normal in caliber.  Post Atrio lesion is 60% stenosed.  Prox Cx lesion is 100% stenosed.  SVG graft was visualized by angiography and is normal in caliber.  LIMA graft was visualized by angiography and is normal in caliber.  Ost LAD lesion is 40% stenosed.  Prox LAD lesion is 40% stenosed.  Prox LAD to Mid LAD lesion is 90% stenosed.   1. Severe triple vessel CAD s/p 3V CABG with 3/3 patent bypass grafts 2. Severe stenosis mid LAD. The mid and distal LAD fills from the LIMA graft 3. The mid Circumflex is chronically occluded. The OM branch fills from the patent vein graft 4. The RCA is chronically occluded just beyond the ostium. The distal RCA and PDA fills from the patent vein graft. The native PDA has moderate stenosis beyond the graft insertion which is unchanged from cardiac cath in 2017.   Recommendations: Continue medical management of CAD.   12/30/17: TTE Study Conclusions - Left ventricle: The cavity size was normal. Wall thickness was   increased in a pattern of mild LVH. Systolic function was   moderately to severely reduced. The  estimated ejection fraction   was in the  range of 30% to 35%. Diffuse hypokinesis. Doppler   parameters are consistent with abnormal left ventricular   relaxation (grade 1 diastolic dysfunction). - Right ventricle: Systolic function was mildly reduced.  12/11/15 LHC Conclusion     Ost RCA to Mid RCA lesion, 100% stenosed.  Ost RPDA lesion, 70% stenosed.  Ost Cx lesion, 60% stenosed.  Prox Cx lesion, 80% stenosed.  Prox Cx to Mid Cx lesion, 100% stenosed.  Ost LM lesion, 40% stenosed.  Prox LAD lesion, 40% stenosed.  Mid LAD-1 lesion, 70% stenosed.  Mid LAD-2 lesion, 100% stenosed.  Findings Left main is 30-40% diffuse disease LAD has 40-50% proximal and 70% mid stenosis and then 100% occluded filling by LIMA to LAD Left circumflex and 50-60% ostial stenosis and 80% proximal stenosis and then 100% occluded beyond the midportion. OM1 is very small is patent RCA is 100% occluded at the ostium Saphenous vein graft to PDA has 70-80% anastomotic stenosis as before at the trifurcation not suitable for PCI Saphenous vein graft to obtuse marginal was patent LIMA to LAD is patent   10/11/13: Echocardiogram Study Conclusions - Left ventricle: Moderate hypokinesis of base/mid inferior wall and base/mid inferolateral wall. The cavity size was normal. Wall thickness was normal. The estimated ejection fraction was 45%. - Left atrium: The atrium was mildly dilated. - Right ventricle: The cavity size was normal. Pacer wire or catheter noted in right ventricle. Systolic function was mildly reduced.   Recent Labs: 01/04/2018: BUN 30; Creatinine, Ser 1.57; Hemoglobin 14.2; Platelets 232; Potassium 4.8; Sodium 147  No results found for requested labs within last 8760 hours.   CrCl cannot be calculated (Patient's most recent lab result is older than the maximum 21 days allowed.).   Wt Readings from Last 3 Encounters:  04/06/18 168 lb (76.2 kg)  01/07/18 171 lb (77.6 kg)    01/04/18 172 lb 3.2 oz (78.1 kg)     Other studies reviewed: Additional studies/records reviewed today include: summarized above   ASSESSMENT AND PLAN:  1. ICM/ICD     intact device function, no changes made  2. CHF    exam is euvolemic    On BB/ARB (Entresto)    His exam does not suggest fluid OL, his weight is up though reports stable morning waights at home  He seems to be taking his coreg and Entresto only once daily at night He has some orthostatic symptoms in the last few months  3. CAD     S/p cath in March, as above, no intervention     No anginal symptoms     On ASA/plavix, BB, statin, nitrate      4. PAFib, older record notes report as a single event 4 minutes duration Aug 2015      Continue to monitor via device     None by device check again today.     5. Carotid disease     Dr. Trula Slade  6.  Orthostatic history      Sounds like this has started up again      He is counseled at length regarding the importance of hydration with water.  He is urged to decrease his pepsi intake by half and replace with water         7. HTN     looks OK here, he reports high numbers at home   8. PVCs by his device check     Not discussed by my last note, historical checks with similar higher and lower  noted.     He is only taking his coreg once daily.     He is describing orthostatic symptoms in the last several months again   I have asked him to make a deliberate change in his water to pepsi intake, with a reduction in his Pepsi.  He mentions that switching to Pepsi helped his stop drinking 10 years ago.  Discussed he can still ave some soda, but needs to cut it down and replace at least 1/2 with water, can flavor it if needed. He had 45mmhg lower BP from supine to sitting though recovered to standing and at his 3 minute stand. No change in HR (rate response is on) HR histograms do show rates that get to 70-90's   I am not convinced we know exactly how he is taking his  medicines either  [facility] 06/24/18 Hgb 13.9 Creat 1.43  Disposition: RN visit in 2 weeks, instructed to bring his home cuff and log of daily BP readings from home as well as all of hs home medicines and when/how he is taking them.  I want his coreg and Entresto to be BID, if he is doing better with his water/orthostatic symptoms perhaps will have him do 1/2 dose of each BID if he in-fact is not taking them now BID.  I will see him back in 1 mo, sooner if needed.  Current medicines are reviewed at length with the patient today.  The patient did not have any concerns regarding medicines.  Haywood Lasso, PA-C 10/17/2018 5:27 PM     [place] [address] [phone] (office)  [phone] (fax)

## 2018-10-18 ENCOUNTER — Ambulatory Visit: Payer: Medicare Other | Admitting: Family

## 2018-10-18 ENCOUNTER — Encounter (HOSPITAL_COMMUNITY): Payer: Medicare Other

## 2018-10-19 ENCOUNTER — Encounter: Payer: Self-pay | Admitting: Family

## 2018-10-19 ENCOUNTER — Ambulatory Visit (INDEPENDENT_AMBULATORY_CARE_PROVIDER_SITE_OTHER): Payer: Medicare Other | Admitting: Physician Assistant

## 2018-10-19 VITALS — BP 124/96 | HR 61 | Ht 70.0 in | Wt 177.0 lb

## 2018-10-19 DIAGNOSIS — Z95810 Presence of automatic (implantable) cardiac defibrillator: Secondary | ICD-10-CM | POA: Diagnosis not present

## 2018-10-19 DIAGNOSIS — I2510 Atherosclerotic heart disease of native coronary artery without angina pectoris: Secondary | ICD-10-CM

## 2018-10-19 DIAGNOSIS — R42 Dizziness and giddiness: Secondary | ICD-10-CM | POA: Diagnosis not present

## 2018-10-19 DIAGNOSIS — I5022 Chronic systolic (congestive) heart failure: Secondary | ICD-10-CM

## 2018-10-19 DIAGNOSIS — I255 Ischemic cardiomyopathy: Secondary | ICD-10-CM | POA: Diagnosis not present

## 2018-10-19 DIAGNOSIS — I493 Ventricular premature depolarization: Secondary | ICD-10-CM | POA: Diagnosis not present

## 2018-10-19 DIAGNOSIS — I10 Essential (primary) hypertension: Secondary | ICD-10-CM

## 2018-10-19 NOTE — Patient Instructions (Addendum)
Medication Instructions:   Your physician recommends that you continue on your current medications as directed. Please refer to the Current Medication list given to you today.   If you need a refill on your cardiac medications before your next appointment, please call your pharmacy.   Lab work: NONE ORDERED  TODAY   If you have labs (blood work) drawn today and your tests are completely normal, you will receive your results only by: Marland Kitchen MyChart Message (if you have MyChart) OR . A paper copy in the mail If you have any lab test that is abnormal or we need to change your treatment, we will call you to review the results.  Testing/Procedures:    Follow-Up:   IN 2 WEEKS NURSE VISIT  BP  AND ORTHOSTATIC CHECK ( BRING ALL CURRENT MEDICATIONS WITH VISIT) BRING BLOOD PRESSURE LOG   ONE MONTH WITH URSUY AFTER VISIT     Remote monitoring is used to monitor your Pacemaker of ICD from home. This monitoring reduces the number of office visits required to check your device to one time per year. It allows [place] to keep an eye on the functioning of your device to ensure it is working properly. You are scheduled for a device check from home on .  12-28-18 You may send your transmission at any time that day. If you have a wireless device, the transmission will be sent automatically. After your physician reviews your transmission, you will receive a postcard with your next transmission date.    Any Other Special Instructions Will Be Listed Below (If Applicable). CHECK BLOOD PRESSURE DAILY AND BRING TO OFFICE VISIT

## 2018-11-04 ENCOUNTER — Ambulatory Visit: Payer: Medicare Other

## 2018-11-12 ENCOUNTER — Ambulatory Visit (INDEPENDENT_AMBULATORY_CARE_PROVIDER_SITE_OTHER): Payer: Medicare Other

## 2018-11-12 VITALS — Wt 178.0 lb

## 2018-11-12 DIAGNOSIS — I10 Essential (primary) hypertension: Secondary | ICD-10-CM

## 2018-11-12 MED ORDER — CARVEDILOL 6.25 MG PO TABS: ORAL_TABLET | ORAL | 12 refills | Status: DC

## 2018-11-12 MED ORDER — SPIRONOLACTONE 25 MG PO TABS: 25.0000 mg | ORAL_TABLET | Freq: Every day | ORAL | 8 refills | Status: DC

## 2018-11-12 NOTE — Patient Instructions (Addendum)
Medication Instructions:  Change Coreg ( Carvedilol to (1/2) 3.125mg  in the morning and 6.25mg  in the night. Change Spironolactone ( Aldactone) to night instead of morning.     If you need a refill on your cardiac medications before your next appointment, please call your pharmacy.    Lab work:  If you have labs (blood work) drawn today and your tests are completely normal, you will receive your results only by: Marland Kitchen MyChart Message (if you have MyChart) OR . A paper copy in the mail If you have any lab test that is abnormal or we need to change your treatment, we will call you to review the results.  Testing/Procedures:   Follow-Up: See Tommye Standard or any APP per Dr. Caryl Comes soon as available for 1 month Office Visit from 10/19/18.   At [facility], you and your health needs are our priority.  As part of our continuing mission to provide you with exceptional heart care, we have created designated Provider Care Teams.  These Care Teams include your primary Cardiologist (physician) and Advanced Practice Providers (APPs -  Physician Assistants and Nurse Practitioners) who all work together to provide you with the care you need, when you need it.   Any Other Special Instructions Will Be Listed Below (If Applicable).

## 2018-11-12 NOTE — Progress Notes (Signed)
1.) Reason for visit: BP check and Orthostatics  2.) Name of MD requesting visit: Renee Ursuy/ Dr. Jolyn Nap*  Pt came in from 10/19/18 OV with Tommye Standard for nurse visit for elevated BP and to bring his meds for verification of what he is taking.. he forgot to bring his home cuff brought his home readings.  PT BP has been staying at 150/90 and pt still c/o feeling "swimmy headed" 2-3 hours into his morning and after taking his meds.  After checking his orthostatics and taking with Dr. Caryl Comes... he felt the following med changes might help and to f/u within the month with Renee or another APP.  Change Coreg to 3.125mg  in the morning and take Aldactone in the PM only.   Pt verbalized understanding and will continue to monitor his BP and will call IF ANYTHING CHANGES OR WORSENS AND WILL KEEP FOLLOW UP.

## 2018-11-19 LAB — CUP PACEART REMOTE DEVICE CHECK
Battery Remaining Longevity: 84 mo
Brady Statistic RA Percent Paced: 54 %
Brady Statistic RV Percent Paced: 0 %
HIGH POWER IMPEDANCE MEASURED VALUE: 51 Ohm
Implantable Lead Implant Date: 20060512
Implantable Lead Location: 753860
Implantable Lead Model: 5076
Implantable Lead Serial Number: 159477
Implantable Pulse Generator Implant Date: 20150123
Lead Channel Impedance Value: 408 Ohm
Lead Channel Pacing Threshold Amplitude: 0.6 V
Lead Channel Pacing Threshold Pulse Width: 0.4 ms
Lead Channel Setting Pacing Amplitude: 2 V
Lead Channel Setting Pacing Amplitude: 2.4 V
Lead Channel Setting Pacing Pulse Width: 0.4 ms
MDC IDC LEAD IMPLANT DT: 20060512
MDC IDC LEAD LOCATION: 753859
MDC IDC MSMT BATTERY REMAINING PERCENTAGE: 97 %
MDC IDC MSMT LEADCHNL RV IMPEDANCE VALUE: 569 Ohm
MDC IDC MSMT LEADCHNL RV PACING THRESHOLD AMPLITUDE: 0.8 V
MDC IDC MSMT LEADCHNL RV PACING THRESHOLD PULSEWIDTH: 0.4 ms
MDC IDC PG SERIAL: 112776
MDC IDC SESS DTM: 20191126054100
MDC IDC SET LEADCHNL RV SENSING SENSITIVITY: 0.6 mV

## 2018-12-06 ENCOUNTER — Encounter: Payer: Self-pay | Admitting: Physician Assistant

## 2018-12-07 ENCOUNTER — Ambulatory Visit (INDEPENDENT_AMBULATORY_CARE_PROVIDER_SITE_OTHER): Payer: Medicare Other | Admitting: Physician Assistant

## 2018-12-07 ENCOUNTER — Encounter: Payer: Self-pay | Admitting: Physician Assistant

## 2018-12-07 VITALS — BP 144/80 | HR 62 | Ht 70.0 in | Wt 176.4 lb

## 2018-12-07 DIAGNOSIS — I2510 Atherosclerotic heart disease of native coronary artery without angina pectoris: Secondary | ICD-10-CM | POA: Insufficient documentation

## 2018-12-07 DIAGNOSIS — I10 Essential (primary) hypertension: Secondary | ICD-10-CM | POA: Diagnosis not present

## 2018-12-07 DIAGNOSIS — I5022 Chronic systolic (congestive) heart failure: Secondary | ICD-10-CM

## 2018-12-07 DIAGNOSIS — Z95810 Presence of automatic (implantable) cardiac defibrillator: Secondary | ICD-10-CM

## 2018-12-07 DIAGNOSIS — N183 Chronic kidney disease, stage 3 unspecified: Secondary | ICD-10-CM | POA: Insufficient documentation

## 2018-12-07 DIAGNOSIS — I951 Orthostatic hypotension: Secondary | ICD-10-CM

## 2018-12-07 DIAGNOSIS — E785 Hyperlipidemia, unspecified: Secondary | ICD-10-CM | POA: Insufficient documentation

## 2018-12-07 NOTE — Patient Instructions (Signed)
Medication Instructions:  No changes.  See your medication list.   If you need a refill on your cardiac medications before your next appointment, please call your pharmacy.   Lab work: None   If you have labs (blood work) drawn today and your tests are completely normal, you will receive your results only by: Marland Kitchen MyChart Message (if you have MyChart) OR . A paper copy in the mail If you have any lab test that is abnormal or we need to change your treatment, we will call you to review the results.  Testing/Procedures: None   Follow-Up: At [facility], you and your health needs are our priority.  As part of our continuing mission to provide you with exceptional heart care, we have created designated Provider Care Teams.  These Care Teams include your primary Cardiologist (physician) and Advanced Practice Providers (APPs -  Physician Assistants and Nurse Practitioners) who all work together to provide you with the care you need, when you need it. Richardson Dopp, PA-C in 6 weeks  Any Other Special Instructions Will Be Listed Below (If Applicable).  Wear compression stockings (knee high) every day.  Put them on when you wake up in the morning and take them off before bed at night.  If you can, you can try to raise the head of your bed to lessen your symptoms at nighttime.  This will keep your blood pressure from dropping too much when you stand.  You should stand slowly and try to pump your calf muscles before standing.  You should especially do this if you do not have the stockings on.  You can get compression stockings at any medical supply store or you can go to the [employer] in [facility] # [phone]).

## 2018-12-07 NOTE — Progress Notes (Signed)
Cardiology Office Note:    Date:  12/07/2018   ID:  ABUBAKR WIEMAN, DOB [birth year], MRN [id number]  PCP:  Ernestene Kiel, MD  Cardiologist/Electrophysiologist:  Virl Axe, MD     Referring MD: Ernestene Kiel, MD   Chief Complaint  Patient presents with  . Follow-up    BP    History of Present Illness:    Connor Wilkerson is a 78 y.o. male with coronary artery disease status post inferior MI and subsequent CABG in 4193, systolic heart failure secondary to ischemic cardiomyopathy, status post AICD, hypertension, hyperlipidemia, chronic kidney disease, paroxysmal atrial fibrillation, carotid artery disease, orthostatic hypotension.  He was admitted in June 2017 with hypotension, acute kidney injury on chronic kidney disease and hyperkalemia.  Medications were adjusted with improved symptoms.  He has had an extensive evaluation with Tommye Standard, PA-C recently.  He was seen in December 2019 with recurrent symptoms of orthostatic hypotension.  He was asked to reduce caffeine and soft drink intake and increase his water intake.  He was only taking Entresto and carvedilol once daily.  He had a 30 mmHg blood pressure drop from lying to sitting.  He return for a nurse visit in January 2020.  Orthostatic vital signs did not demonstrate any significant drop when going from lying to standing.  Dr. Caryl Comes suggested changing his carvedilol to 3.125 mg twice daily and taking his Spironolactone in the evening only.   Mr. Strick returns for follow up.  He is here alone.  Since making the medication changes, he has not noticed much difference his his symptoms.  He often gets lightheaded when he stands up from a seated position.  He does not feel near syncopal and denies syncope.  He has not had any chest pain.  He is short of breath with more moderate activities.  He has not had orthopnea, paroxysmal nocturnal dyspnea, leg swelling.  He denies nausea, vomiting, diarrhea, melena, hematochezia.  He does  not smoke or drink alcohol.  He does not like water.  He has tried to reduce soft drink intake and drink more gatorade.   Prior CV studies:   The following studies were reviewed today:  Cardiac catheterization 01/07/2018 LAD ostial 40, proximal 40, mid 90 LCx proximal 100-CTO RCA proximal 100-CTO; RP AVB 16 SVG-distal RCA patent SVG-OM 2 patent LIMA-LAD patent 1. Severe triple vessel CAD s/p 3V CABG with 3/3 patent bypass grafts 2. Severe stenosis mid LAD. The mid and distal LAD fills from the LIMA graft 3. The mid Circumflex is chronically occluded. The OM branch fills from the patent vein graft 4. The RCA is chronically occluded just beyond the ostium. The distal RCA and PDA fills from the patent vein graft. The native PDA has moderate stenosis beyond the graft insertion which is unchanged from cardiac cath in 2017.  Recommendations: Continue medical management of CAD.     Echocardiogram 12/30/2017 Mild LVH, EF 30-35, diffuse HK, grade 1 diastolic dysfunction, mildly reduced RVSF  Carotid US 12/14/2017 Final Interpretation: Right Carotid: Velocities in the right ICA are consistent with a 40-59%                stenosis. Unable to obtain increased velocity as per exam of                06/01/2017 Left Carotid: Velocities in the left ICA are consistent with a 40-59% stenosis. Vertebrals: Both vertebral arteries were patent with antegrade flow.  Echo 04/12/2016 EF 35-40, PASP 39, mild  to moderate TR, mild LAE  Myoview 09/12/15  Defect 1: There is a medium defect of mild severity present in the basal inferolateral and mid inferolateral location.  Findings consistent with ischemia.  The left ventricular ejection fraction is moderately decreased (30-44%).  Nuclear stress EF: 34%.  This is a high risk study.  Nuclear (6/15):   Lateral wall infarction/scar, no ischemia, EF 30%  Echo (12/14):   Base/mid inferior and inferolateral HK, EF 45%, mild LAE, mildly reduced RVSF  LHC  (12/11):  Inferior HK, EF 40-45%, ostial and distal left main 10-20%, ostial and proximal LAD 30-40% then occluded, proximal D3 20-25%, ostial circumflex 40-50% then occluded, ostial RCA occluded, SVG-OM patent, SVG-PDA 80-85% at the anastomosis, LIMA-LAD patent >> medical therapy of S-PDA unless fails then high risk PCI  Carotid US (11/15):   RICA 40-59%   Past Medical History:  Diagnosis Date  . Abnormality of gait 02/07/2015  . CAD (coronary artery disease)    Hx of Inf MI treated with CABG in 1999 // Myoview 11/16: mild inf-lat ischemia, EF 34, high risk // LHC 3/19: 3/3 bypass grafts patent  . Carotid artery disease ([place])    Followed by [facility] (Dr. Trula Slade) // [place] in 2/19: bilat ICA 40-59  . Chronic systolic CHF (congestive heart failure) ([place]) 01/01/2010   Echo 12/30/2017: Mild LVH, EF 30-35, diffuse HK, grade 1 diastolic dysfunction, mildly reduced RVSF // Echo 04/12/2016:  EF 35-40, PASP 39, mild to moderate TR, mild LAE // Echo (12/14):  Base/mid inferior and inferolateral HK, EF 45%, mild LAE, mildly reduced RVSF  . CKD (chronic kidney disease)   . HLP (hyperkeratosis lenticularis perstans)   . HTN (hypertension)   . ICD (implantable cardiac defibrillator)  BSx   . Ischemic cardiomyopathy   . Myocardial infarction ([place]) 1999  . Near syncope 09/21/2014  . Orthostatic hypotension   . PAF (paroxysmal atrial fibrillation) (HCC)    Surgical Hx: The patient  has a past surgical history that includes Cardiac defibrillator placement; Coronary artery bypass graft; Hernia repair; Rotator cuff repair (Right, 2006); Elbow surgery (Left); Sinus exploration (1987); Permanent pacemaker generator change (N/A, 11/25/2013); Cardiac catheterization (N/A, 12/11/2015); Cardiac defibrillator placement (04/08/2016); LEFT HEART CATH AND CORS/GRAFTS ANGIOGRAPHY (N/A, 01/07/2018); Ultrasound guidance for vascular access (01/07/2018); and Colonoscopy (10/06/2007).   Current Medications: Current Meds  Medication Sig  .  acetaminophen (TYLENOL) 500 MG tablet Take 1,000 mg by mouth every 6 (six) hours as needed for moderate pain or headache.  Marland Kitchen amLODipine (NORVASC) 5 MG tablet Take 1 tablet (5 mg total) by mouth daily.  Marland Kitchen aspirin 81 MG tablet Take 81 mg by mouth daily.   Marland Kitchen buPROPion (WELLBUTRIN XL) 300 MG 24 hr tablet Take 300 mg by mouth at bedtime.   . carvedilol (COREG) 6.25 MG tablet Take 1/2 tablet (3.125mg ) by mouth in the morning and 1 tablet (6.25mg  ) by mouth at night.  . clopidogrel (PLAVIX) 75 MG tablet Take 1 tablet (75 mg total) by mouth at bedtime.  . colchicine 0.6 MG tablet Take 0.6 mg by mouth daily.   . cyclobenzaprine (FLEXERIL) 5 MG tablet Take 5 mg by mouth 3 (three) times daily as needed for muscle spasms.   Marland Kitchen escitalopram (LEXAPRO) 10 MG tablet Take 10 mg by mouth at bedtime.   . famotidine (PEPCID) 20 MG tablet Take 20 mg by mouth 2 (two) times daily.   Marland Kitchen gabapentin (NEURONTIN) 300 MG capsule Take 600 mg by mouth at bedtime.   Marland Kitchen  nitroGLYCERIN (NITROSTAT) 0.4 MG SL tablet Place 1 tablet (0.4 mg total) under the tongue every 5 (five) minutes as needed for chest pain.  Marland Kitchen oxyCODONE-acetaminophen (PERCOCET) 10-325 MG tablet Take 1 tablet by mouth 2 (two) times daily as needed for pain.   . rosuvastatin (CRESTOR) 40 MG tablet TAKE 1 TABLET BY MOUTH AT BEDTIME. REPORTED ON 05/19/2016 (Patient taking differently: TAKE 40 MG BY MOUTH AT BEDTIME)  . sacubitril-valsartan (ENTRESTO) 97-103 MG Take 1 tablet by mouth 2 (two) times daily.  Marland Kitchen spironolactone (ALDACTONE) 25 MG tablet Take 1 tablet (25 mg total) by mouth daily. Take at night only  . traZODone (DESYREL) 50 MG tablet Take 50 mg by mouth at bedtime as needed for sleep.   Current Facility-Administered Medications for the 12/07/18 encounter (Office Visit) with Richardson Dopp T, PA-C  Medication  . cloNIDine (CATAPRES) tablet 0.2 mg     Allergies:   Tramadol   Social History   Tobacco Use  . Smoking status: Former Smoker    Last attempt to  quit: 09/19/1999    Years since quitting: 19.2  . Smokeless tobacco: Never Used  . Tobacco comment: quit in 1998  Substance Use Topics  . Alcohol use: No    Alcohol/week: 0.0 standard drinks    Comment: recovering alcoholic, quit 64+ yrs ago  . Drug use: No     Family Hx: The patient's family history includes Heart attack in his brother, father, and mother; Heart disease in his brother, father, and mother.  ROS:   Please see the history of present illness.    Review of Systems  Cardiovascular: Positive for dyspnea on exertion.   All other systems reviewed and are negative.   EKGs/Labs/Other Test Reviewed:    EKG:  EKG is  ordered today.  The ekg ordered today demonstrates A paced, HR 62, normal axis, non-specific ST-TW changes, PVCs, QTc 442  Recent Labs: 01/04/2018: BUN 30; Creatinine, Ser 1.57; Hemoglobin 14.2; Platelets 232; Potassium 4.8; Sodium 147   Recent Lipid Panel Lab Results  Component Value Date/Time   CHOL 156 12/11/2015 05:29 AM   TRIG 112 12/11/2015 05:29 AM   HDL 47 12/11/2015 05:29 AM   CHOLHDL 3.3 12/11/2015 05:29 AM   LDLCALC 87 12/11/2015 05:29 AM   From [facility]    Physical Exam:    VS:  BP (!) 144/80   Pulse 62   Ht 5\' 10"  (1.778 m)   Wt 176 lb 6.4 oz (80 kg)   BMI 25.31 kg/m     Wt Readings from Last 3 Encounters:  12/07/18 176 lb 6.4 oz (80 kg)  11/12/18 178 lb (80.7 kg)  10/19/18 177 lb (80.3 kg)     Physical Exam  Constitutional: He is oriented to person, place, and time. He appears well-developed and well-nourished. No distress.  HENT:  Head: Normocephalic and atraumatic.  Eyes: No scleral icterus.  Neck: No thyromegaly present.  Cardiovascular: Normal rate and regular rhythm.  No murmur heard. Pulmonary/Chest: Effort normal. He has no wheezes. He has no rales.  Abdominal: Soft.  Musculoskeletal:        General: No edema.  Lymphadenopathy:    He has no cervical adenopathy.  Neurological: He is alert and oriented to person,  place, and time.  Skin: Skin is warm and dry.  Psychiatric: He has a normal mood and affect.    ASSESSMENT & PLAN:    Essential hypertension / Orthostatic hypotension Overall his BP is closer to target.  He  still has a 20+ mmHg BP drop from lying to sitting.  I would like for him to try wearing compression stockings before we make any further medication changes.  We discussed the importance of wearing the stockings every day and to get up slowly when seated for a prolonged period of time.  If his symptoms improve with wearing compression stockings with less BP drop when standing, we could consider increasing his Carvedilol back to 6.25 mg BID.  Otherwise, if his symptoms are lifestyle limiting, we could consider Midorine or Pyridostigmine.    Chronic systolic CHF (congestive heart failure) (HCC)  EF 30-35.  NYHA 2.  Volume status stable.  Continue angiotensin receptor neprilysin inhibitor, beta-blocker, mineralocorticoid receptor antagonist.    Coronary artery disease involving native coronary artery of native heart without angina pectoris  Hx if inferior MI and CABG in 1999.  Cardiac Catheterization in 3/19 demonstrated patent bypass grafts.  He denies angina.  Continue ASA, beta-blocker, statin.  Cardiac defibrillator in situ  FU with EP as planned.    Dispo:  Return in about 6 weeks (around 01/18/2019) for Routine Follow Up, w/ Richardson Dopp, PA-C.   Medication Adjustments/Labs and Tests Ordered: Current medicines are reviewed at length with the patient today.  Concerns regarding medicines are outlined above.  Tests Ordered: Orders Placed This Encounter  Procedures  . EKG 12-Lead   Medication Changes: No orders of the defined types were placed in this encounter.   Signed, Richardson Dopp, PA-C  12/07/2018 3:04 PM    [facility] [address], [place], [place]  [address] Phone: [phone]; Fax: [phone]

## 2018-12-20 ENCOUNTER — Ambulatory Visit: Payer: Medicare Other | Admitting: Family

## 2018-12-20 ENCOUNTER — Encounter (HOSPITAL_COMMUNITY): Payer: Medicare Other

## 2018-12-22 ENCOUNTER — Other Ambulatory Visit: Payer: Self-pay

## 2018-12-22 ENCOUNTER — Ambulatory Visit (HOSPITAL_COMMUNITY)
Admission: RE | Admit: 2018-12-22 | Discharge: 2018-12-22 | Disposition: A | Discharge status: Home / Self Care | Payer: Medicare Other | Source: Ambulatory Visit | Attending: Family | Admitting: Family

## 2018-12-22 ENCOUNTER — Ambulatory Visit (INDEPENDENT_AMBULATORY_CARE_PROVIDER_SITE_OTHER): Payer: Medicare Other | Admitting: Physician Assistant

## 2018-12-22 ENCOUNTER — Encounter: Payer: Self-pay | Admitting: Physician Assistant

## 2018-12-22 VITALS — BP 145/81 | HR 62 | Temp 96.7°F | Resp 12 | Ht 70.0 in | Wt 179.3 lb

## 2018-12-22 DIAGNOSIS — I6523 Occlusion and stenosis of bilateral carotid arteries: Secondary | ICD-10-CM | POA: Diagnosis not present

## 2018-12-22 NOTE — Progress Notes (Signed)
HISTORY AND PHYSICAL     CC:  follow up. Requesting Provider:  Ernestene Kiel, MD  HPI: This is a 78 y.o. male here for follow up for carotid artery stenosis.    Pt was last seen a year ago and he remained asymptomatic.  When he was first seen in 2015, he was having syncope and multiple falls felt to be related to hypotension and this resolved.  He also had an episode of dysarthria and garbled speech during this time without and permanent deficits.    He does have hx of inferior MI and subsequent CABG in 1999, hx of heart failure with an EF of 30-35%. with AICD, htn, hyperlipidemia, CKD and PAF and is followed by [facility]   He does have orthostatic hypotension.      He is here today for follow up.  He states he is doing well.  He denies any amaurosis fugax, speech difficulties, facial droop or clumsiness or hemiparesis.  He states he does have some tingling in his right leg at the vein harvest site.  The weakness in his arm when asked is that his pinky finger on the left hand will not close up to the ring finger and he attributes this to arthritis. He states that his syncope is better and he has not fallen in about a month.  He states he did get a sharp pain in his great toe a couple of nights ago but this was not persistent.   The pt is on a statin for cholesterol management.  The pt is not diabetic.   The pt is on BB, CCB & ARB for hypertension.   Tobacco hx:  remote The pt is on a daily aspirin. Other AC:  Plavix    Past Medical History:  Diagnosis Date  . Abnormality of gait 02/07/2015  . CAD (coronary artery disease)    Hx of Inf MI treated with CABG in 1999 // Myoview 11/16: mild inf-lat ischemia, EF 34, high risk // LHC 3/19: 3/3 bypass grafts patent  . Carotid artery disease ([place])    Followed by [facility] (Dr. Trula Slade) // [place] in 2/19: bilat ICA 40-59  . Chronic systolic CHF (congestive heart failure) ([place]) 01/01/2010   Echo 12/30/2017: Mild LVH, EF 30-35, diffuse HK, grade  1 diastolic dysfunction, mildly reduced RVSF // Echo 04/12/2016:  EF 35-40, PASP 39, mild to moderate TR, mild LAE // Echo (12/14):  Base/mid inferior and inferolateral HK, EF 45%, mild LAE, mildly reduced RVSF  . CKD (chronic kidney disease)   . HLP (hyperkeratosis lenticularis perstans)   . HTN (hypertension)   . ICD (implantable cardiac defibrillator)  BSx   . Ischemic cardiomyopathy   . Myocardial infarction ([place]) 1999  . Near syncope 09/21/2014  . Orthostatic hypotension   . PAF (paroxysmal atrial fibrillation) ([place])     Past Surgical History:  Procedure Laterality Date  . CARDIAC CATHETERIZATION N/A 12/11/2015   Procedure: Left Heart Cath and Coronary Angiography;  Surgeon: Charolette Forward, MD;  Location: [place] [facility];  Service: Cardiovascular;  Laterality: N/A;  . CARDIAC DEFIBRILLATOR PLACEMENT    . CARDIAC DEFIBRILLATOR PLACEMENT  04/08/2016   NOT MRI SAFE (DOCUMENT SCANNED IN SYSTEM  . COLONOSCOPY  10/06/2007   Mild sigmoid diverticulosis. Small internal hemorrhoids.   . CORONARY ARTERY BYPASS GRAFT    . ELBOW SURGERY Left    plastic bone replacement  . HERNIA REPAIR     x2  . LEFT HEART CATH AND CORS/GRAFTS  ANGIOGRAPHY N/A 01/07/2018   Procedure: LEFT HEART CATH AND CORS/GRAFTS ANGIOGRAPHY;  Surgeon: Burnell Blanks, MD;  Location: [place] [facility];  Service: Cardiovascular;  Laterality: N/A;  . PERMANENT PACEMAKER GENERATOR CHANGE N/A 11/25/2013   Procedure: PERMANENT PACEMAKER GENERATOR CHANGE;  Surgeon: Deboraha Sprang, MD;  Location: [facility] CATH LAB;  Service: Cardiovascular;  Laterality: N/A;  . ROTATOR CUFF REPAIR Right 2006  . SINUS EXPLORATION  1987  . ULTRASOUND GUIDANCE FOR VASCULAR ACCESS  01/07/2018   Procedure: Ultrasound Guidance For Vascular Access;  Surgeon: Burnell Blanks, MD;  Location: [place] [facility];  Service: Cardiovascular;;    Allergies  Allergen Reactions  . Tramadol Other (See Comments)    Unknown     Current Outpatient  Medications  Medication Sig Dispense Refill  . acetaminophen (TYLENOL) 500 MG tablet Take 1,000 mg by mouth every 6 (six) hours as needed for moderate pain or headache.    Marland Kitchen amLODipine (NORVASC) 5 MG tablet Take 1 tablet (5 mg total) by mouth daily. 30 tablet 7  . aspirin 81 MG tablet Take 81 mg by mouth daily.     Marland Kitchen buPROPion (WELLBUTRIN XL) 300 MG 24 hr tablet Take 300 mg by mouth at bedtime.     . carvedilol (COREG) 6.25 MG tablet Take 1/2 tablet (3.125mg ) by mouth in the morning and 1 tablet (6.25mg  ) by mouth at night. 60 tablet 12  . clopidogrel (PLAVIX) 75 MG tablet Take 1 tablet (75 mg total) by mouth at bedtime. 90 tablet 3  . colchicine 0.6 MG tablet Take 0.6 mg by mouth daily.     . cyclobenzaprine (FLEXERIL) 5 MG tablet Take 5 mg by mouth 3 (three) times daily as needed for muscle spasms.     Marland Kitchen escitalopram (LEXAPRO) 10 MG tablet Take 10 mg by mouth at bedtime.     . famotidine (PEPCID) 20 MG tablet Take 20 mg by mouth 2 (two) times daily.     Marland Kitchen gabapentin (NEURONTIN) 300 MG capsule Take 600 mg by mouth at bedtime.     . nitroGLYCERIN (NITROSTAT) 0.4 MG SL tablet Place 1 tablet (0.4 mg total) under the tongue every 5 (five) minutes as needed for chest pain. 25 tablet 5  . oxyCODONE-acetaminophen (PERCOCET) 10-325 MG tablet Take 1 tablet by mouth 2 (two) times daily as needed for pain.     . rosuvastatin (CRESTOR) 40 MG tablet TAKE 1 TABLET BY MOUTH AT BEDTIME. REPORTED ON 05/19/2016 (Patient taking differently: TAKE 40 MG BY MOUTH AT BEDTIME) 90 tablet 2  . sacubitril-valsartan (ENTRESTO) 97-103 MG Take 1 tablet by mouth 2 (two) times daily. 60 tablet 11  . spironolactone (ALDACTONE) 25 MG tablet Take 1 tablet (25 mg total) by mouth daily. Take at night only 30 tablet 8  . traZODone (DESYREL) 50 MG tablet Take 50 mg by mouth at bedtime as needed for sleep.  6   Current Facility-Administered Medications  Medication Dose Route Frequency Provider Last Rate Last Dose  . cloNIDine  (CATAPRES) tablet 0.2 mg  0.2 mg Oral Once Deboraha Sprang, MD        Family History  Problem Relation Age of Onset  . Heart disease Mother   . Heart attack Mother   . Heart disease Father   . Heart attack Father   . Heart disease Brother   . Heart attack Brother     Social History   Socioeconomic History  . Marital status: Married    Spouse  name: Not on file  . Number of children: 1  . Years of education: HS +  . Highest education level: Not on file  Occupational History  . Occupation: retired  Scientific laboratory technician  . Financial resource strain: Not on file  . Food insecurity:    Worry: Not on file    Inability: Not on file  . Transportation needs:    Medical: Not on file    Non-medical: Not on file  Tobacco Use  . Smoking status: Former Smoker    Last attempt to quit: 09/19/1999    Years since quitting: 19.2  . Smokeless tobacco: Never Used  . Tobacco comment: quit in 1998  Substance and Sexual Activity  . Alcohol use: No    Alcohol/week: 0.0 standard drinks    Comment: recovering alcoholic, quit 59+ yrs ago  . Drug use: No  . Sexual activity: Not on file  Lifestyle  . Physical activity:    Days per week: Not on file    Minutes per session: Not on file  . Stress: Not on file  Relationships  . Social connections:    Talks on phone: Not on file    Gets together: Not on file    Attends religious service: Not on file    Active member of club or organization: Not on file    Attends meetings of clubs or organizations: Not on file    Relationship status: Not on file  . Intimate partner violence:    Fear of current or ex partner: Not on file    Emotionally abused: Not on file    Physically abused: Not on file    Forced sexual activity: Not on file  Other Topics Concern  . Not on file  Social History Narrative   Lives at home w/ his wife.   Patient is right handed.   Patient drinks about 3 cups of soda daily.     REVIEW OF SYSTEMS:   [X]  denotes positive  finding, [ ]  denotes negative finding Cardiac  Comments:  Chest pain or chest pressure:    Shortness of breath upon exertion: x   Short of breath when lying flat:    Irregular heart rhythm:        Vascular    Pain in calf, thigh, or hip brought on by ambulation:    Pain in feet at night that wakes you up from your sleep:  x   Blood clot in your veins:    Leg swelling:  x       Pulmonary    Oxygen at home:    Productive cough:     Wheezing:         Neurologic    Sudden weakness in arms or legs:  x See HPI  Sudden numbness in arms or legs:     Sudden onset of difficulty speaking or slurred speech:    Temporary loss of vision in one eye:     Problems with dizziness:         Gastrointestinal    Blood in stool:     Vomited blood:         Genitourinary    Burning when urinating:     Blood in urine:        Psychiatric    Major depression:  x       Hematologic    Bleeding problems:    Problems with blood clotting too easily:        Skin  Rashes or ulcers:        Constitutional    Fever or chills:      PHYSICAL EXAMINATION:  Today's Vitals   12/22/18 1550  BP: (!) 145/81  Pulse: 62  Resp: 12  Temp: (!) 96.7 F (35.9 C)  TempSrc: Oral  SpO2: 97%  Weight: 179 lb 5.5 oz (81.4 kg)  Height: 5\' 10"  (1.778 m)   Body mass index is 25.73 kg/m.   General:  WDWN in NAD; vital signs documented above Gait: Not observed HENT: WNL, normocephalic Pulmonary: normal non-labored breathing , without Rales, rhonchi,  wheezing Cardiac: regular HR, without  Murmurs, rubs or gallops; without carotid bruits Abdomen: soft, NT, no masses Skin: without rashes Vascular Exam/Pulses:  Right Left  Radial F1+ (weak) 2+ (normal)  Ulnar Unable to palpate  Unable to palpate   Femoral 2+ (normal) 2+ (normal)  Popliteal 1+ (weak) 1+ (weak)  DP 1+ (weak) 1+ (weak)  PT Unable to palpate  Unable to palpate    Extremities: without ischemic changes, without Gangrene , without  cellulitis; without open wounds;  Musculoskeletal: no muscle wasting or atrophy  Neurologic: A&O X 3 Psychiatric:  The pt has Normal affect.   Non-Invasive Vascular Imaging:   Carotid Duplex on 12/22/2018: Right:  40-59% stenosis Left:  40-59% stenosis Vertebrals:  Bilateral vertebral arteries demonstrate antegrade flow. Subclavians: Normal flow hemodynamics were seen in bilateral subclavian arteries.  Previous Carotid duplex on 12/14/17: Right: 40-59% stenosis Left:   40-59% stenosis vertebrals patent with antegrade flow   ASSESSMENT/PLAN:: 78 y.o. male here for follow up carotid artery stenosis.   -pt doing well and remains asymptomatic.  His carotid duplex is about the same as previous duplex.  -discussed s/s of stroke with pt and they understand should they develop any of these sx, they will go to the nearest ER. -will have him f/u in 9 months with repeat duplex.  -continue asa/statin  Leontine Locket, PA-C [facility] [phone]  Clinic MD:  Oneida Alar

## 2018-12-28 ENCOUNTER — Ambulatory Visit (INDEPENDENT_AMBULATORY_CARE_PROVIDER_SITE_OTHER): Payer: Medicare Other | Admitting: *Deleted

## 2018-12-28 DIAGNOSIS — I255 Ischemic cardiomyopathy: Secondary | ICD-10-CM

## 2018-12-29 LAB — CUP PACEART REMOTE DEVICE CHECK
Brady Statistic RV Percent Paced: 1 %
Date Time Interrogation Session: 20200225054100
HighPow Impedance: 50 Ohm
Implantable Lead Implant Date: 20060512
Implantable Lead Location: 753859
Implantable Lead Location: 753860
Implantable Lead Serial Number: 159477
Implantable Pulse Generator Implant Date: 20150123
Lead Channel Impedance Value: 580 Ohm
Lead Channel Pacing Threshold Amplitude: 0.8 V
Lead Channel Pacing Threshold Pulse Width: 0.4 ms
Lead Channel Pacing Threshold Pulse Width: 0.4 ms
Lead Channel Setting Sensing Sensitivity: 0.6 mV
MDC IDC LEAD IMPLANT DT: 20060512
MDC IDC MSMT BATTERY REMAINING LONGEVITY: 78 mo
MDC IDC MSMT BATTERY REMAINING PERCENTAGE: 92 %
MDC IDC MSMT LEADCHNL RA IMPEDANCE VALUE: 404 Ohm
MDC IDC MSMT LEADCHNL RA PACING THRESHOLD AMPLITUDE: 0.6 V
MDC IDC SET LEADCHNL RA PACING AMPLITUDE: 2 V
MDC IDC SET LEADCHNL RV PACING AMPLITUDE: 2.4 V
MDC IDC SET LEADCHNL RV PACING PULSEWIDTH: 0.4 ms
MDC IDC STAT BRADY RA PERCENT PACED: 63 %
Pulse Gen Serial Number: 112776

## 2019-01-04 ENCOUNTER — Encounter: Payer: Self-pay | Admitting: Cardiology

## 2019-01-04 NOTE — Progress Notes (Signed)
Remote ICD transmission.   

## 2019-01-08 ENCOUNTER — Other Ambulatory Visit: Payer: Self-pay | Admitting: Physician Assistant

## 2019-01-18 ENCOUNTER — Ambulatory Visit: Payer: Medicare Other | Admitting: Physician Assistant

## 2019-02-15 ENCOUNTER — Other Ambulatory Visit: Payer: Self-pay | Admitting: Internal Medicine

## 2019-02-15 MED ORDER — SPIRONOLACTONE 25 MG PO TABS: 25.0000 mg | ORAL_TABLET | Freq: Every day | ORAL | 3 refills | Status: DC

## 2019-02-18 ENCOUNTER — Telehealth: Payer: Self-pay | Admitting: *Deleted

## 2019-02-18 ENCOUNTER — Telehealth: Payer: Self-pay | Admitting: Physician Assistant

## 2019-02-18 NOTE — Telephone Encounter (Signed)
New Message  Patient wants to make sure they are set up properly for her appointment.

## 2019-02-18 NOTE — Telephone Encounter (Signed)
SPOKE WITH WIFE WHO WANTS TO TRY MYCHART AND SEE IF SHE CAN STILL GO THROUGH [facility]  AND MEANEVER THORUGH  IT.  BUT IF NOT WOULD LIKE TO DO DOXIMITY NUMBER IN NOTE SECTION   PT WILL CALL BACK TODAY BY 5 WITH SURETY

## 2019-02-22 NOTE — Progress Notes (Signed)
Virtual Visit via Telephone Note   This visit type was conducted due to national recommendations for restrictions regarding the COVID-19 Pandemic (e.g. social distancing) in an effort to limit this patient's exposure and mitigate transmission in our community.  Due to his co-morbid illnesses, this patient is at least at moderate risk for complications without adequate follow up.  This format is felt to be most appropriate for this patient at this time.  The patient did not have access to video technology/had technical difficulties with video requiring transitioning to audio format only (telephone).  All issues noted in this document were discussed and addressed.  No physical exam could be performed with this format.  Please refer to the patient's chart for his  consent to telehealth for [facility].   Evaluation Performed:  Follow-up visit  Date:  02/23/2019   ID:  Connor Wilkerson, Connor Wilkerson 1941/03/31, MRN [id number]  Patient Location: Home Provider Location: Home  PCP:  Ernestene Kiel, MD  Cardiologist/Electrophysiologist:  Virl Axe, MD     Chief Complaint: Follow-up on hypertension, orthostatic hypotension and CAD  History of Present Illness:    Connor Wilkerson is a 78 y.o. male with coronary artery disease status post inferior MI and subsequent CABG in 2694, systolic heart failure secondary to ischemic cardiomyopathy, status post AICD, hypertension, hyperlipidemia, chronic kidney disease, paroxysmal atrial fibrillation, carotid artery disease, orthostatic hypotension.  He has recently been seen for symptomatic orthostatic hypotension.  He was last seen in 12/2018.    Today, he notes continued symptoms of dizziness with standing.  His blood pressures also remain uncontrolled.  He typically gets readings of 170s over 80s.  He was unable to obtain compression stockings.  He has not had chest discomfort, significant shortness of breath, orthopnea, lower extremity swelling, syncope or  near syncope.  He has not had fever, cough, vomiting, diarrhea or bleeding issues.  The patient does not have symptoms concerning for COVID-19 infection (fever, chills, cough, or new shortness of breath).    Past Medical History:  Diagnosis Date  . Abnormality of gait 02/07/2015  . CAD (coronary artery disease)    Hx of Inf MI treated with CABG in 1999 // Myoview 11/16: mild inf-lat ischemia, EF 34, high risk // LHC 3/19: 3/3 bypass grafts patent  . Carotid artery disease ([place])    Followed by [facility] (Dr. Trula Slade) // [place] in 2/19: bilat ICA 40-59  . Chronic systolic CHF (congestive heart failure) ([place]) 01/01/2010   Echo 12/30/2017: Mild LVH, EF 30-35, diffuse HK, grade 1 diastolic dysfunction, mildly reduced RVSF // Echo 04/12/2016:  EF 35-40, PASP 39, mild to moderate TR, mild LAE // Echo (12/14):  Base/mid inferior and inferolateral HK, EF 45%, mild LAE, mildly reduced RVSF  . CKD (chronic kidney disease)   . HLP (hyperkeratosis lenticularis perstans)   . HTN (hypertension)   . ICD (implantable cardiac defibrillator)  BSx   . Ischemic cardiomyopathy   . Myocardial infarction ([place]) 1999  . Near syncope 09/21/2014  . Orthostatic hypotension   . PAF (paroxysmal atrial fibrillation) ([place])    Past Surgical History:  Procedure Laterality Date  . CARDIAC CATHETERIZATION N/A 12/11/2015   Procedure: Left Heart Cath and Coronary Angiography;  Surgeon: Charolette Forward, MD;  Location: [place] [facility];  Service: Cardiovascular;  Laterality: N/A;  . CARDIAC DEFIBRILLATOR PLACEMENT    . CARDIAC DEFIBRILLATOR PLACEMENT  04/08/2016   NOT MRI SAFE (DOCUMENT SCANNED IN SYSTEM  . COLONOSCOPY  10/06/2007   Mild  sigmoid diverticulosis. Small internal hemorrhoids.   . CORONARY ARTERY BYPASS GRAFT    . ELBOW SURGERY Left    plastic bone replacement  . HERNIA REPAIR     x2  . LEFT HEART CATH AND CORS/GRAFTS ANGIOGRAPHY N/A 01/07/2018   Procedure: LEFT HEART CATH AND CORS/GRAFTS ANGIOGRAPHY;  Surgeon: Burnell Blanks, MD;  Location: [place] [facility];  Service: Cardiovascular;  Laterality: N/A;  . PERMANENT PACEMAKER GENERATOR CHANGE N/A 11/25/2013   Procedure: PERMANENT PACEMAKER GENERATOR CHANGE;  Surgeon: Deboraha Sprang, MD;  Location: [facility];  Service: Cardiovascular;  Laterality: N/A;  . ROTATOR CUFF REPAIR Right 2006  . SINUS EXPLORATION  1987  . ULTRASOUND GUIDANCE FOR VASCULAR ACCESS  01/07/2018   Procedure: Ultrasound Guidance For Vascular Access;  Surgeon: Burnell Blanks, MD;  Location: [place] [facility];  Service: Cardiovascular;;     Current Meds  Medication Sig  . acetaminophen (TYLENOL) 500 MG tablet Take 1,000 mg by mouth every 6 (six) hours as needed for moderate pain or headache.  Marland Kitchen amLODipine (NORVASC) 5 MG tablet TAKE 1 TABLET (5 MG TOTAL) BY MOUTH DAILY.  Marland Kitchen aspirin 81 MG tablet Take 81 mg by mouth daily.   Marland Kitchen buPROPion (WELLBUTRIN XL) 300 MG 24 hr tablet Take 300 mg by mouth at bedtime.   . clopidogrel (PLAVIX) 75 MG tablet Take 1 tablet (75 mg total) by mouth at bedtime.  . colchicine 0.6 MG tablet Take 0.6 mg by mouth daily.   . cyclobenzaprine (FLEXERIL) 5 MG tablet Take 5 mg by mouth 3 (three) times daily as needed for muscle spasms.   Marland Kitchen escitalopram (LEXAPRO) 10 MG tablet Take 10 mg by mouth at bedtime.   . famotidine (PEPCID) 20 MG tablet Take 20 mg by mouth 2 (two) times daily.   Marland Kitchen gabapentin (NEURONTIN) 300 MG capsule Take 600 mg by mouth at bedtime.   . nitroGLYCERIN (NITROSTAT) 0.4 MG SL tablet Place 1 tablet (0.4 mg total) under the tongue every 5 (five) minutes as needed for chest pain.  Marland Kitchen oxyCODONE-acetaminophen (PERCOCET) 10-325 MG tablet Take 1 tablet by mouth 2 (two) times daily as needed for pain.   . rosuvastatin (CRESTOR) 40 MG tablet TAKE 1 TABLET BY MOUTH AT BEDTIME. REPORTED ON 05/19/2016 (Patient taking differently: TAKE 40 MG BY MOUTH AT BEDTIME)  . sacubitril-valsartan (ENTRESTO) 97-103 MG Take 1 tablet by mouth 2 (two) times daily.  Marland Kitchen  spironolactone (ALDACTONE) 25 MG tablet Take 1 tablet (25 mg total) by mouth daily. Take at night only  . traZODone (DESYREL) 50 MG tablet Take 50 mg by mouth at bedtime as needed for sleep.  . [DISCONTINUED] carvedilol (COREG) 6.25 MG tablet Take 1/2 tablet (3.125mg ) by mouth in the morning and 1 tablet (6.25mg  ) by mouth at night.   Current Facility-Administered Medications for the 02/23/19 encounter (Video Visit) with Richardson Dopp T, PA-C  Medication  . cloNIDine (CATAPRES) tablet 0.2 mg     Allergies:   Tramadol   Social History   Tobacco Use  . Smoking status: Former Smoker    Last attempt to quit: 09/19/1999    Years since quitting: 19.4  . Smokeless tobacco: Never Used  . Tobacco comment: quit in 1998  Substance Use Topics  . Alcohol use: No    Alcohol/week: 0.0 standard drinks    Comment: recovering alcoholic, quit 53+ yrs ago  . Drug use: No     Family Hx: The patient's family history includes Heart attack  in his brother, father, and mother; Heart disease in his brother, father, and mother.  ROS:   Please see the history of present illness.    All other systems reviewed and are negative.   Prior CV studies:   The following studies were reviewed today:  Carotid US 12/22/2018 Bilat ICA 40-59  Cardiac catheterization 01/07/2018 LAD ostial 40, proximal 40, mid 90 LCx proximal 100-CTO RCA proximal 100-CTO; RP AVB 16 SVG-distal RCA patent SVG-OM 2 patent LIMA-LAD patent 1. Severe triple vessel CAD s/p 3V CABG with 3/3 patent bypass grafts 2. Severe stenosis mid LAD. The mid and distal LAD fills from the LIMA graft 3. The mid Circumflex is chronically occluded. The OM branch fills from the patent vein graft 4. The RCA is chronically occluded just beyond the ostium. The distal RCA and PDA fills from the patent vein graft. The native PDA has moderate stenosis beyond the graft insertion which is unchanged from cardiac cath in 2017.  Recommendations: Continue medical  management of CAD.      Echocardiogram 12/30/2017 Mild LVH, EF 30-35, diffuse HK, grade 1 diastolic dysfunction, mildly reduced RVSF   Carotid US 12/14/2017 Final Interpretation: Right Carotid: Velocities in the right ICA are consistent with a 40-59%                stenosis. Unable to obtain increased velocity as per exam of                06/01/2017 Left Carotid: Velocities in the left ICA are consistent with a 40-59% stenosis. Vertebrals: Both vertebral arteries were patent with antegrade flow.   Echo 04/12/2016 EF 35-40, PASP 39, mild to moderate TR, mild LAE   Myoview 09/12/15  Defect 1: There is a medium defect of mild severity present in the basal inferolateral and mid inferolateral location.  Findings consistent with ischemia.  The left ventricular ejection fraction is moderately decreased (30-44%).  Nuclear stress EF: 34%.  This is a high risk study.   Nuclear (6/15):   Lateral wall infarction/scar, no ischemia, EF 30%   Echo (12/14):   Base/mid inferior and inferolateral HK, EF 45%, mild LAE, mildly reduced RVSF   LHC (12/11):  Inferior HK, EF 40-45%, ostial and distal left main 10-20%, ostial and proximal LAD 30-40% then occluded, proximal D3 20-25%, ostial circumflex 40-50% then occluded, ostial RCA occluded, SVG-OM patent, SVG-PDA 80-85% at the anastomosis, LIMA-LAD patent >> medical therapy of S-PDA unless fails then high risk PCI   Carotid US (11/15):   RICA 40-59%   Labs/Other Tests and Data Reviewed:    EKG:  No ECG reviewed.  Recent Labs: No results found for requested labs within last 8760 hours.   Recent Lipid Panel Lab Results  Component Value Date/Time   CHOL 156 12/11/2015 05:29 AM   TRIG 112 12/11/2015 05:29 AM   HDL 47 12/11/2015 05:29 AM   CHOLHDL 3.3 12/11/2015 05:29 AM   LDLCALC 87 12/11/2015 05:29 AM    Wt Readings from Last 3 Encounters:  02/23/19 170 lb (77.1 kg)  12/22/18 179 lb 5.5 oz (81.4 kg)  12/07/18 176 lb 6.4 oz (80 kg)      Objective:    Vital Signs:  BP (!) 174/96   Pulse 70   Ht 5\' 10"  (1.778 m)   Wt 170 lb (77.1 kg)   BMI 24.39 kg/m    VITAL SIGNS:  reviewed GEN:  no acute distress RESPIRATORY:  No labored breathing noted NEURO:  Alert and oriented  PSYCH:  He seems to be in good spirits  ASSESSMENT & PLAN:    Orthostatic hypotension He has continued to have problems with orthostatic hypotension.  Unfortunately, when he seated his blood pressure is uncontrolled.  I have asked him to try to obtain compression stockings through a service like [employer] or [employer].  If he is unable to obtain these, I can see if the [employer] would pay for a couple of pairs for him.  I will try to adjust his blood pressure medications further by increasing his carvedilol to 6.25 mg twice daily.  I will try to arrange [facility] to make a visit to obtain official orthostatic vital signs, calibrate with his home machine and obtain medication reconciliation.  If he continues to have significant drop from lying to standing, we could consider starting him on pyridostigmine.  However, I will review further with Dr. Caryl Comes prior to any medication adjustments.  Chronic systolic CHF (congestive heart failure) (HCC) EF 30-35.  NYHA 2.  Volume status fairly stable.  Continue beta-blocker, Entresto, spironolactone.  Coronary artery disease involving native coronary artery of native heart without angina pectoris Hx if inferior MI and CABG in 1999.  Cardiac Catheterization in 3/19 demonstrated patent bypass grafts.  He is not having angina.  Continue aspirin, statin, beta-blocker.  COVID-19 Education: The signs and symptoms of COVID-19 were discussed with the patient and how to seek care for testing (follow up with PCP or arrange E-visit).  The importance of social distancing was discussed today.  Time:   Today, I have spent 24 minutes with the patient with telehealth technology discussing the above problems.      Medication Adjustments/Labs and Tests Ordered: Current medicines are reviewed at length with the patient today.  Concerns regarding medicines are outlined above.   Tests Ordered: No orders of the defined types were placed in this encounter.   Medication Changes: Meds ordered this encounter  Medications  . carvedilol (COREG) 6.25 MG tablet    Sig: Take 1 tablet (6.25 mg total) by mouth 2 (two) times daily with a meal.    Dispense:  180 tablet    Refill:  3    Dose increase    Order Specific Question:   Supervising Provider    Answer:   Lelon Perla [[id number]]    Disposition:  Follow up in 8 week(s)  Signed, Richardson Dopp, PA-C  02/23/2019 3:33 PM    [place]

## 2019-02-23 ENCOUNTER — Encounter: Payer: Self-pay | Admitting: Physician Assistant

## 2019-02-23 ENCOUNTER — Telehealth: Payer: Self-pay | Admitting: Physician Assistant

## 2019-02-23 ENCOUNTER — Telehealth (INDEPENDENT_AMBULATORY_CARE_PROVIDER_SITE_OTHER): Payer: Medicare Other | Admitting: Physician Assistant

## 2019-02-23 VITALS — BP 174/96 | HR 70 | Ht 70.0 in | Wt 170.0 lb

## 2019-02-23 DIAGNOSIS — I255 Ischemic cardiomyopathy: Secondary | ICD-10-CM

## 2019-02-23 DIAGNOSIS — Z7189 Other specified counseling: Secondary | ICD-10-CM

## 2019-02-23 DIAGNOSIS — I2510 Atherosclerotic heart disease of native coronary artery without angina pectoris: Secondary | ICD-10-CM

## 2019-02-23 DIAGNOSIS — I951 Orthostatic hypotension: Secondary | ICD-10-CM

## 2019-02-23 DIAGNOSIS — I5022 Chronic systolic (congestive) heart failure: Secondary | ICD-10-CM

## 2019-02-23 MED ORDER — CARVEDILOL 6.25 MG PO TABS: 6.2500 mg | ORAL_TABLET | Freq: Two times a day (BID) | ORAL | 3 refills | Status: DC

## 2019-02-23 NOTE — Telephone Encounter (Signed)
[place] Visit Request  Agency Requested:  [facility] Contact:  Joen Laura, RN Phone #: [phone] Fax #: [phone]  Patient Information: Name:  Connor Wilkerson  DOB:  08-20-1941  MRN:  [id number]  Address:   [address] [place] [address]  Phone Numbers:   Home Phone [phone]  Work Phone [phone]  Mobile [phone]     Requesting Provider:   Richardson Dopp, PA-C   Diagnosis:   Orthostatic hypotension, HTN, CHF  Reason for Visit:   Check orthostatic VS, BP cuff and med rec  Services Requested:  Vital Signs (BP, Pulse, O2, Weight)  Orthostatic Vital Signs  Medication Reconciliation  # of Visits Needed/Frequency per Week: 1

## 2019-02-23 NOTE — Patient Instructions (Signed)
Medication Instructions:  Increase Carvedilol to 6.25 mg twice daily - a new prescription has been sent to your pharmacy.  If you need a refill on your cardiac medications before your next appointment, please call your pharmacy.   Lab work: None   If you have labs (blood work) drawn today and your tests are completely normal, you will receive your results only by: Marland Kitchen MyChart Message (if you have MyChart) OR . A paper copy in the mail If you have any lab test that is abnormal or we need to change your treatment, we will call you to review the results.  Testing/Procedures: None   Follow-Up: At [facility], you and your health needs are our priority.  As part of our continuing mission to provide you with exceptional heart care, we have created designated Provider Care Teams.  These Care Teams include your primary Cardiologist (physician) and Advanced Practice Providers (APPs -  Physician Assistants and Nurse Practitioners) who all work together to provide you with the care you need, when you need it. You will need a follow up appointment in:  8 weeks.  Please call our office 2 months in advance to schedule this appointment.  You may see Virl Axe, MD or Richardson Dopp, PA-C   Any Other Special Instructions Will Be Listed Below (If Applicable). 1. Try to obtain compression stockings and wear them daily. 2. I will arrange for a nurse to come out to your home to check your BP readings and to check your BP cuff.

## 2019-02-24 DIAGNOSIS — G894 Chronic pain syndrome: Secondary | ICD-10-CM | POA: Diagnosis not present

## 2019-02-24 DIAGNOSIS — I2510 Atherosclerotic heart disease of native coronary artery without angina pectoris: Secondary | ICD-10-CM | POA: Diagnosis not present

## 2019-02-25 ENCOUNTER — Telehealth: Payer: Self-pay | Admitting: *Deleted

## 2019-02-25 NOTE — Telephone Encounter (Signed)
Ok. Richardson Dopp, PA-C    02/25/2019 1:44 PM

## 2019-02-25 NOTE — Telephone Encounter (Signed)
Tiffany from [place] at [employer]  received your fax.  Please call her at [phone]

## 2019-02-25 NOTE — Telephone Encounter (Signed)
Epic fax request

## 2019-03-10 NOTE — Telephone Encounter (Signed)
Spoke with the pt and he verbalized understanding of Scott's instructions... he wrote everything down... he will call if he has any problems and to report how he is doing... pt to check his BP and keep recorded.  Has follow up 04/22/19... with Dr. Caryl Comes. Will check with Richardson Dopp PA if he would like a sooner appt... pt reports that he has been taking his Entresto without missing a dose, twice day.

## 2019-03-10 NOTE — Telephone Encounter (Signed)
Ok. Keep follow up with Dr.Klein as planned. Richardson Dopp, PA-C    03/10/2019 4:43 PM

## 2019-03-10 NOTE — Telephone Encounter (Signed)
Called the pt and he is taking his wife to an appointment and asked if we could call back after 1pm so he has more time to go over his instructions per Richardson Dopp PA.

## 2019-03-10 NOTE — Telephone Encounter (Signed)
[place] Visit Follow Up  Agency Performing Home Visit:  [facility]  Patient Information: Name:  Connor Wilkerson  DOB:  [birth year]  MRN:  [id number]    [facility] received from [facility] reviewed.  BP drops from lying to standing:  182/70 >> 128/80 >> 138/76. Listed medications appear to be accurate with chart except he should be on Entresto (it is not listed on the home visit progress note).   PLAN:  1. BP lying to sitting has a significant drop but the BP sitting and standing are optimal.   2. BP lying is too high. 3. He should avoid taking any BP medication within 4 hours of going to bed (this includes norvasc, coreg, entresto, spironolactone).   4. He should try raise the head of his bed to about a 45 degree incline to help prevent significant BP drop from lying to standing.   5. He should avoid lying down during the day.  He should try to sit in a chair if he needs to take a nap. 6. Compression hose should help with BP dropping from sitting to standing.  He should wear those while he is up and moving about during the day. 7. Please confirm with the patient if he is or is not taking Entresto.  Let me know if he is not taking it. 8. Follow up as planned.  No medication changes for now.  Richardson Dopp, PA-C    03/10/2019 8:25 AM

## 2019-03-29 ENCOUNTER — Ambulatory Visit (INDEPENDENT_AMBULATORY_CARE_PROVIDER_SITE_OTHER): Payer: Medicare Other | Admitting: *Deleted

## 2019-03-29 DIAGNOSIS — I255 Ischemic cardiomyopathy: Secondary | ICD-10-CM

## 2019-03-29 DIAGNOSIS — I5022 Chronic systolic (congestive) heart failure: Secondary | ICD-10-CM | POA: Diagnosis not present

## 2019-03-30 LAB — CUP PACEART REMOTE DEVICE CHECK
Battery Remaining Longevity: 78 mo
Battery Remaining Percentage: 88 %
Brady Statistic RA Percent Paced: 64 %
Brady Statistic RV Percent Paced: 1 %
Date Time Interrogation Session: 20200526044300
HighPow Impedance: 47 Ohm
Implantable Lead Implant Date: 20060512
Implantable Lead Implant Date: 20060512
Implantable Lead Location: 753859
Implantable Lead Location: 753860
Implantable Lead Model: 158
Implantable Lead Model: 5076
Implantable Lead Serial Number: 159477
Implantable Pulse Generator Implant Date: 20150123
Lead Channel Impedance Value: 419 Ohm
Lead Channel Impedance Value: 628 Ohm
Lead Channel Pacing Threshold Amplitude: 0.6 V
Lead Channel Pacing Threshold Amplitude: 0.8 V
Lead Channel Pacing Threshold Pulse Width: 0.4 ms
Lead Channel Pacing Threshold Pulse Width: 0.4 ms
Lead Channel Setting Pacing Amplitude: 2 V
Lead Channel Setting Pacing Amplitude: 2.4 V
Lead Channel Setting Pacing Pulse Width: 0.4 ms
Lead Channel Setting Sensing Sensitivity: 0.6 mV
Pulse Gen Serial Number: 112776

## 2019-04-06 NOTE — Progress Notes (Signed)
Remote ICD transmission.   

## 2019-04-22 ENCOUNTER — Encounter: Payer: Self-pay | Admitting: Internal Medicine

## 2019-04-22 ENCOUNTER — Other Ambulatory Visit: Payer: Self-pay

## 2019-04-22 ENCOUNTER — Telehealth (INDEPENDENT_AMBULATORY_CARE_PROVIDER_SITE_OTHER): Payer: Medicare Other | Admitting: Internal Medicine

## 2019-04-22 VITALS — BP 147/86 | Ht 70.0 in | Wt 172.0 lb

## 2019-04-22 DIAGNOSIS — I5022 Chronic systolic (congestive) heart failure: Secondary | ICD-10-CM

## 2019-04-22 DIAGNOSIS — Z95810 Presence of automatic (implantable) cardiac defibrillator: Secondary | ICD-10-CM

## 2019-04-22 DIAGNOSIS — I951 Orthostatic hypotension: Secondary | ICD-10-CM

## 2019-04-22 DIAGNOSIS — I255 Ischemic cardiomyopathy: Secondary | ICD-10-CM

## 2019-04-22 DIAGNOSIS — I493 Ventricular premature depolarization: Secondary | ICD-10-CM

## 2019-04-22 NOTE — Progress Notes (Signed)
Electrophysiology TeleHealth Note   Due to national recommendations of social distancing due to COVID 19, an audio/video telehealth visit is felt to be most appropriate for this patient at this time.  See MyChart message from today for the patient's consent to telehealth for [facility].   Date:  04/22/2019   ID:  Connor Wilkerson, DOB 02/14/1941, MRN [id number]  Location: patient's home  Provider location: [address] [place]  Evaluation Performed: Follow-up visit  PCP:  Ernestene Kiel, MD  Cardiologist:     Electrophysiologist:  SK   Chief Complaint:  Weakness ischemic heart disese  History of Present Illness:    Connor Wilkerson is a 78 y.o. male who presents via audio/video conferencing for a telehealth visit today.  Since last being seen in our clinic, the patient reports ongoing shortness of breath and AM LH   DATE TEST EF   11/16 Myoview  34 %   2/17 Cath  LM 30%; Native T; LIMA patent; SVG-PDA 70-80% anastomosis SVG-OM Patent  2/19  Echo  30-35%   3/19` LHC  LIMA p, SVG-dRCAp, SVG-OM2p,  Native T     Remote interrogation 5/20 PVCs about 3%.   The patient denies symptoms of fevers, chills, cough, or new SOB worrisome for COVID 19.    Past Medical History:  Diagnosis Date  . Abnormality of gait 02/07/2015  . CAD (coronary artery disease)    Hx of Inf MI treated with CABG in 1999 // Myoview 11/16: mild inf-lat ischemia, EF 34, high risk // LHC 3/19: 3/3 bypass grafts patent  . Carotid artery disease ([place])    Followed by [facility] (Dr. Trula Slade) // [place] in 2/19: bilat ICA 40-59  . Chronic systolic CHF (congestive heart failure) ([place]) 01/01/2010   Echo 12/30/2017: Mild LVH, EF 30-35, diffuse HK, grade 1 diastolic dysfunction, mildly reduced RVSF // Echo 04/12/2016:  EF 35-40, PASP 39, mild to moderate TR, mild LAE // Echo (12/14):  Base/mid inferior and inferolateral HK, EF 45%, mild LAE, mildly reduced RVSF  . CKD (chronic kidney disease)   .  HLP (hyperkeratosis lenticularis perstans)   . HTN (hypertension)   . ICD (implantable cardiac defibrillator)  BSx   . Ischemic cardiomyopathy   . Myocardial infarction ([place]) 1999  . Near syncope 09/21/2014  . Orthostatic hypotension   . PAF (paroxysmal atrial fibrillation) ([place])     Past Surgical History:  Procedure Laterality Date  . CARDIAC CATHETERIZATION N/A 12/11/2015   Procedure: Left Heart Cath and Coronary Angiography;  Surgeon: Charolette Forward, MD;  Location: [facility];  Service: Cardiovascular;  Laterality: N/A;  . CARDIAC DEFIBRILLATOR PLACEMENT    . CARDIAC DEFIBRILLATOR PLACEMENT  04/08/2016   NOT MRI SAFE (DOCUMENT SCANNED IN SYSTEM  . COLONOSCOPY  10/06/2007   Mild sigmoid diverticulosis. Small internal hemorrhoids.   . CORONARY ARTERY BYPASS GRAFT    . ELBOW SURGERY Left    plastic bone replacement  . HERNIA REPAIR     x2  . LEFT HEART CATH AND CORS/GRAFTS ANGIOGRAPHY N/A 01/07/2018   Procedure: LEFT HEART CATH AND CORS/GRAFTS ANGIOGRAPHY;  Surgeon: Burnell Blanks, MD;  Location: [facility];  Service: Cardiovascular;  Laterality: N/A;  . PERMANENT PACEMAKER GENERATOR CHANGE N/A 11/25/2013   Procedure: PERMANENT PACEMAKER GENERATOR CHANGE;  Surgeon: Deboraha Sprang, MD;  Location: [facility];  Service: Cardiovascular;  Laterality: N/A;  . ROTATOR CUFF REPAIR Right 2006  . SINUS EXPLORATION  1987  .  ULTRASOUND GUIDANCE FOR VASCULAR ACCESS  01/07/2018   Procedure: Ultrasound Guidance For Vascular Access;  Surgeon: Burnell Blanks, MD;  Location: [facility];  Service: Cardiovascular;;    Current Outpatient Medications  Medication Sig Dispense Refill  . amLODipine (NORVASC) 5 MG tablet TAKE 1 TABLET (5 MG TOTAL) BY MOUTH DAILY. 90 tablet 3  . aspirin 81 MG tablet Take 81 mg by mouth daily.     . carvedilol (COREG) 6.25 MG tablet Take 1 tablet (6.25 mg total) by mouth 2 (two) times daily with a meal. 180 tablet 3  . clopidogrel (PLAVIX)  75 MG tablet Take 1 tablet (75 mg total) by mouth at bedtime. 90 tablet 3  . colchicine 0.6 MG tablet Take 0.6 mg by mouth daily.     Marland Kitchen escitalopram (LEXAPRO) 10 MG tablet Take 10 mg by mouth at bedtime.     . gabapentin (NEURONTIN) 300 MG capsule Take 600 mg by mouth at bedtime.     . nitroGLYCERIN (NITROSTAT) 0.4 MG SL tablet Place 1 tablet (0.4 mg total) under the tongue every 5 (five) minutes as needed for chest pain. 25 tablet 5  . oxyCODONE-acetaminophen (PERCOCET) 10-325 MG tablet Take 1 tablet by mouth 2 (two) times daily as needed for pain.     . rosuvastatin (CRESTOR) 40 MG tablet Take 40 mg by mouth daily.    . sacubitril-valsartan (ENTRESTO) 97-103 MG Take 1 tablet by mouth 2 (two) times daily. 60 tablet 11  . spironolactone (ALDACTONE) 25 MG tablet Take 25 mg by mouth 2 (two) times daily.    . traZODone (DESYREL) 50 MG tablet Take 50 mg by mouth at bedtime as needed for sleep.  6  . buPROPion (WELLBUTRIN XL) 300 MG 24 hr tablet Take 300 mg by mouth at bedtime.      No current facility-administered medications for this visit.     Allergies:   Tramadol   Social History:  The patient  reports that he quit smoking about 19 years ago. He has never used smokeless tobacco. He reports that he does not drink alcohol or use drugs.   Family History:  The patient's   family history includes Heart attack in his brother, father, and mother; Heart disease in his brother, father, and mother.   ROS:  Please see the history of present illness.   All other systems are personally reviewed and negative.    Exam:    Vital Signs:  BP (!) 147/86   Ht 5\' 10"  (1.778 m)   Wt 172 lb (78 kg)   BMI 24.68 kg/m     Labs/Other Tests and Data Reviewed:    Recent Labs: No results found for requested labs within last 8760 hours.   Wt Readings from Last 3 Encounters:  04/22/19 172 lb (78 kg)  02/23/19 170 lb (77.1 kg)  12/22/18 179 lb 5.5 oz (81.4 kg)     Other studies personally reviewed:  Additional studies/ records that were reviewed today include:      Last device remote is reviewed from [facility] PDF dated 5/20 which reveals normal device function,   arrhythmias - Nonsustained VT present  Ns    ASSESSMENT & PLAN:    Ischemic cardiomyopathy  Implantable defibrillator    Hypertension   PVC  Hyperlipidemia    Congestive heart failure-chronic-systolic  Generalized lassitude, stable over the last year  Extensive cardiac evaluation w no clear contributors;  I dont think his HTN is contributing, 150's is all, and  no records of low blood pressure   PVC burden about 3% over the last year, again not enough to likely be contributing   Will check LDL    Encouraged him to followup with his PCP     COVID 19 screen The patient denies symptoms of COVID 19 at this time.  The importance of social distancing was discussed today.  Follow-up:  9m Next remote: As Scheduled   Current medicines are reviewed at length with the patient today.   The patient does not have concerns regarding his medicines.  The following changes were made today:  none  Labs/ tests ordered today include:   Could you ask him to get fsting lipids at his PCP  thx  No orders of the defined types were placed in this encounter.   Future tests ( post COVID )   s  Patient Risk:  after full review of this patients clinical status, I feel that they are at moderate risk at this time.  Today, I have spent 11 minutes with the patient with telehealth technology discussing the above.  Signed, Virl Axe, MD  04/22/2019 2:41 PM     [place] [address] [place] [address] [phone] (office) [phone] (fax)

## 2019-05-02 DIAGNOSIS — R0989 Other specified symptoms and signs involving the circulatory and respiratory systems: Secondary | ICD-10-CM | POA: Diagnosis not present

## 2019-05-02 DIAGNOSIS — Z6825 Body mass index (BMI) 25.0-25.9, adult: Secondary | ICD-10-CM | POA: Diagnosis not present

## 2019-05-02 DIAGNOSIS — R200 Anesthesia of skin: Secondary | ICD-10-CM | POA: Diagnosis not present

## 2019-05-02 DIAGNOSIS — D649 Anemia, unspecified: Secondary | ICD-10-CM | POA: Diagnosis not present

## 2019-05-02 DIAGNOSIS — R7301 Impaired fasting glucose: Secondary | ICD-10-CM | POA: Diagnosis not present

## 2019-05-02 DIAGNOSIS — E785 Hyperlipidemia, unspecified: Secondary | ICD-10-CM | POA: Diagnosis not present

## 2019-05-02 DIAGNOSIS — I739 Peripheral vascular disease, unspecified: Secondary | ICD-10-CM | POA: Diagnosis not present

## 2019-05-02 DIAGNOSIS — Z79899 Other long term (current) drug therapy: Secondary | ICD-10-CM | POA: Diagnosis not present

## 2019-05-02 DIAGNOSIS — I2510 Atherosclerotic heart disease of native coronary artery without angina pectoris: Secondary | ICD-10-CM | POA: Diagnosis not present

## 2019-05-02 DIAGNOSIS — I10 Essential (primary) hypertension: Secondary | ICD-10-CM | POA: Diagnosis not present

## 2019-05-31 DIAGNOSIS — G894 Chronic pain syndrome: Secondary | ICD-10-CM | POA: Diagnosis not present

## 2019-05-31 DIAGNOSIS — M79642 Pain in left hand: Secondary | ICD-10-CM | POA: Diagnosis not present

## 2019-06-06 DIAGNOSIS — G5622 Lesion of ulnar nerve, left upper limb: Secondary | ICD-10-CM | POA: Diagnosis not present

## 2019-06-06 DIAGNOSIS — M19029 Primary osteoarthritis, unspecified elbow: Secondary | ICD-10-CM | POA: Diagnosis not present

## 2019-06-06 DIAGNOSIS — M25522 Pain in left elbow: Secondary | ICD-10-CM | POA: Diagnosis not present

## 2019-06-06 DIAGNOSIS — M79642 Pain in left hand: Secondary | ICD-10-CM | POA: Diagnosis not present

## 2019-06-08 DIAGNOSIS — K219 Gastro-esophageal reflux disease without esophagitis: Secondary | ICD-10-CM | POA: Diagnosis not present

## 2019-06-08 DIAGNOSIS — K2270 Barrett's esophagus without dysplasia: Secondary | ICD-10-CM | POA: Diagnosis not present

## 2019-06-13 ENCOUNTER — Encounter (HOSPITAL_COMMUNITY): Payer: Medicare Other

## 2019-06-13 ENCOUNTER — Ambulatory Visit: Payer: Medicare Other | Admitting: Family

## 2019-06-13 DIAGNOSIS — G5622 Lesion of ulnar nerve, left upper limb: Secondary | ICD-10-CM | POA: Diagnosis not present

## 2019-06-18 ENCOUNTER — Other Ambulatory Visit: Payer: Self-pay | Admitting: Physician Assistant

## 2019-06-20 DIAGNOSIS — G5622 Lesion of ulnar nerve, left upper limb: Secondary | ICD-10-CM | POA: Diagnosis not present

## 2019-06-20 DIAGNOSIS — M19022 Primary osteoarthritis, left elbow: Secondary | ICD-10-CM | POA: Diagnosis not present

## 2019-06-20 DIAGNOSIS — M25522 Pain in left elbow: Secondary | ICD-10-CM | POA: Diagnosis not present

## 2019-06-21 ENCOUNTER — Other Ambulatory Visit: Payer: Self-pay | Admitting: Internal Medicine

## 2019-06-21 ENCOUNTER — Telehealth: Payer: Self-pay | Admitting: Internal Medicine

## 2019-06-21 MED ORDER — CLOPIDOGREL BISULFATE 75 MG PO TABS: 75.0000 mg | ORAL_TABLET | Freq: Every day | ORAL | 3 refills | Status: DC

## 2019-06-21 NOTE — Telephone Encounter (Signed)
Please advise if ok to refill Historical provider medication.

## 2019-06-21 NOTE — Telephone Encounter (Signed)
Pt's medication was sent to pt's pharmacy as requested. Confirmation received.  °

## 2019-06-21 NOTE — Telephone Encounter (Signed)
   [facility] Pre-operative Risk Assessment    Request for surgical clearance:  1. What type of surgery is being performed?  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 When is this surgery scheduled?  TBD   3. What type of clearance is required (medical clearance vs. Pharmacy clearance to hold med vs. Both)? Both  4. Are there any medications that need to be held prior to surgery and how long? ASA and Plavix  5. Practice name and name of physician performing surgery?  [facility]- Dr. Amedeo Plenty   6. What is your office phone number? [phone]    7.   What is your office fax number? [place]  8.   Anesthesia type (None, local, MAC, general) ? Block with MAC   Loren Racer 06/21/2019, 11:27 AM  _________________________________________________________________   (provider comments below)

## 2019-06-22 NOTE — Telephone Encounter (Signed)
No

## 2019-06-22 NOTE — Telephone Encounter (Signed)
   Primary Cardiologist: Virl Axe, MD  Chart reviewed as part of pre-operative protocol [place]. Patient was contacted 06/22/2019 in reference to pre-operative risk assessment for pending surgery as outlined below.  Connor Wilkerson was last seen on 04/22/2019 by Dr. Caryl Comes via a telemedicine visit.  Since that day, Connor Wilkerson has done well from a cardiac standpoint. He denies chest pain, SOB, or palpitations. He walks several days per week, plays outdoors with his dogs, can go up a flight of stairs, perform household chores, and ADLs without anginal complaints, which easily amounts to >4 METs.    Therefore, based on ACC/AHA guidelines, the patient would be at acceptable risk for the planned procedure without further cardiovascular testing.   Per Dr. Caryl Comes, patient can hold aspirin and plavix 5-7 days prior to his upcoming orthopedic surgery. He should restart aspirin and plavix as soon as he is cleared to do so by his surgeon.   I will route this recommendation to the requesting party via Epic fax function and remove from pre-op pool.  Please call with questions.  Abigail Butts, PA-C 06/22/2019, 8:20 AM

## 2019-06-29 ENCOUNTER — Encounter: Payer: Medicare Other | Admitting: *Deleted

## 2019-07-04 ENCOUNTER — Telehealth: Payer: Self-pay

## 2019-07-04 NOTE — Telephone Encounter (Signed)
YOUR CARDIOLOGY TEAM HAS ARRANGED FOR AN E-VISIT FOR YOUR APPOINTMENT - PLEASE REVIEW IMPORTANT INFORMATION BELOW SEVERAL DAYS PRIOR TO YOUR APPOINTMENT ° °Due to the recent COVID-19 pandemic, we are transitioning in-person office visits to tele-medicine visits in an effort to decrease unnecessary exposure to our patients, their families, and staff. These visits are billed to your insurance just like a normal visit is. We also encourage you to sign up for MyChart if you have not already done so. You will need a smartphone if possible. For patients that do not have this, we can still complete the visit using a regular telephone but do prefer a smartphone to enable video when possible. You may have a family member that lives with you that can help. If possible, we also ask that you have a blood pressure cuff and scale at home to measure your blood pressure, heart rate and weight prior to your scheduled appointment. Patients with clinical needs that need an in-person evaluation and testing will still be able to come to the office if absolutely necessary. If you have any questions, feel free to call our office. ° ° ° ° °YOUR PROVIDER WILL BE USING THE FOLLOWING PLATFORM TO COMPLETE YOUR VISIT: Phone Call ° °• IF USING MYCHART - How to Download the MyChart App to Your SmartPhone  ° °- If Apple, go to App Store and type in MyChart in the search bar and download the app. If Android, ask patient to go to Google Play Store and type in MyChart in the search bar and download the app. The app is free but as with any other app downloads, your phone may require you to verify saved payment information or Apple/Android password.  °- You will need to then log into the app with your MyChart username and password, and select [facility] as your healthcare provider to link the account.  °- When it is time for your visit, go to the MyChart app, find appointments, and click Begin Video Visit. Be sure to Select Allow for your device to  access the Microphone and Camera for your visit. You will then be connected, and your provider will be with you shortly. ° **If you have any issues connecting or need assistance, please contact MyChart service desk [phone] ([phone])** ° **If using a computer, in order to ensure the best quality for your visit, you will need to use either of the following Internet Browsers: Google Chrome or Microsoft Edge** ° °• IF USING DOXIMITY or [email or web address] - The staff will give you instructions on receiving your link to join the meeting the day of your visit.  ° ° ° ° °2-3 DAYS BEFORE YOUR APPOINTMENT ° °You will receive a telephone call from one of our HeartCare team members - your caller ID may say "Unknown caller." If this is a video visit, we will walk you through how to get the video launched on your phone. We will remind you check your blood pressure, heart rate and weight prior to your scheduled appointment. If you have an Apple Watch or Kardia, please upload any pertinent ECG strips the day before or morning of your appointment to MyChart. Our staff will also make sure you have reviewed the consent and agree to move forward with your scheduled tele-health visit.  ° ° ° °THE DAY OF YOUR APPOINTMENT ° °Approximately 15 minutes prior to your scheduled appointment, you will receive a telephone call from one of HeartCare team - your caller ID may say "Unknown caller."    Our staff will confirm medications, vital signs for the day and any symptoms you may be experiencing. Please have this information available prior to the time of visit start. It may also be helpful for you to have a pad of paper and pen handy for any instructions given during your visit. They will also walk you through joining the smartphone meeting if this is a video visit.    CONSENT FOR TELE-HEALTH VISIT - PLEASE REVIEW  I hereby voluntarily request, consent and authorize [facility] and its employed or contracted physicians, physician  assistants, nurse practitioners or other licensed health care professionals (the Practitioner), to provide me with telemedicine health care services (the "Services") as deemed necessary by the treating Practitioner. I acknowledge and consent to receive the Services by the Practitioner via telemedicine. I understand that the telemedicine visit will involve communicating with the Practitioner through live audiovisual communication technology and the disclosure of certain medical information by electronic transmission. I acknowledge that I have been given the opportunity to request an in-person assessment or other available alternative prior to the telemedicine visit and am voluntarily participating in the telemedicine visit.  I understand that I have the right to withhold or withdraw my consent to the use of telemedicine in the course of my care at any time, without affecting my right to future care or treatment, and that the Practitioner or I may terminate the telemedicine visit at any time. I understand that I have the right to inspect all information obtained and/or recorded in the course of the telemedicine visit and may receive copies of available information for a reasonable fee.  I understand that some of the potential risks of receiving the Services via telemedicine include:  . Delay or interruption in medical evaluation due to technological equipment failure or disruption; . Information transmitted may not be sufficient (e.g. poor resolution of images) to allow for appropriate medical decision making by the Practitioner; and/or  . In rare instances, security protocols could fail, causing a breach of personal health information.  Furthermore, I acknowledge that it is my responsibility to provide information about my medical history, conditions and care that is complete and accurate to the best of my ability. I acknowledge that Practitioner's advice, recommendations, and/or decision may be based on  factors not within their control, such as incomplete or inaccurate data provided by me or distortions of diagnostic images or specimens that may result from electronic transmissions. I understand that the practice of medicine is not an exact science and that Practitioner makes no warranties or guarantees regarding treatment outcomes. I acknowledge that I will receive a copy of this consent concurrently upon execution via email to the email address I last provided but may also request a printed copy by calling the office of [facility].    I understand that my insurance will be billed for this visit.   I have read or had this consent read to me. . I understand the contents of this consent, which adequately explains the benefits and risks of the Services being provided via telemedicine.  . I have been provided ample opportunity to ask questions regarding this consent and the Services and have had my questions answered to my satisfaction. . I give my informed consent for the services to be provided through the use of telemedicine in my medical care  By participating in this telemedicine visit I agree to the above.  

## 2019-07-05 ENCOUNTER — Other Ambulatory Visit: Payer: Self-pay

## 2019-07-05 ENCOUNTER — Telehealth (INDEPENDENT_AMBULATORY_CARE_PROVIDER_SITE_OTHER): Payer: Medicare Other | Admitting: Physician Assistant

## 2019-07-05 VITALS — BP 155/90 | Ht 70.0 in | Wt 170.0 lb

## 2019-07-05 DIAGNOSIS — I5022 Chronic systolic (congestive) heart failure: Secondary | ICD-10-CM | POA: Diagnosis not present

## 2019-07-05 DIAGNOSIS — I2510 Atherosclerotic heart disease of native coronary artery without angina pectoris: Secondary | ICD-10-CM

## 2019-07-05 DIAGNOSIS — I10 Essential (primary) hypertension: Secondary | ICD-10-CM

## 2019-07-05 MED ORDER — SACUBITRIL-VALSARTAN 97-103 MG PO TABS: 1.0000 | ORAL_TABLET | Freq: Two times a day (BID) | ORAL | 3 refills | Status: AC

## 2019-07-05 NOTE — Progress Notes (Signed)
Virtual Visit via Telephone Note   This visit type was conducted due to national recommendations for restrictions regarding the COVID-19 Pandemic (e.g. social distancing) in an effort to limit this patient's exposure and mitigate transmission in our community.  Due to his co-morbid illnesses, this patient is at least at moderate risk for complications without adequate follow up.  This format is felt to be most appropriate for this patient at this time.  The patient did not have access to video technology/had technical difficulties with video requiring transitioning to audio format only (telephone).  All issues noted in this document were discussed and addressed.  No physical exam could be performed with this format.  Please refer to the patient's chart for his  consent to telehealth for [facility].   Date:  07/05/2019   ID:  [name], DOB 06-29-1941, MRN [id number]  Patient Location: Home Provider Location: Home  PCP:  Ernestene Kiel, MD  Cardiologist:  Virl Axe, MD   Electrophysiologist:  None   Evaluation Performed:  Follow-Up Visit  Chief Complaint: CAD, CHF  History of Present Illness:    Connor Wilkerson is a 78 y.o. male with:  Coronary artery disease  S/p inferior MI  >> s/p CABG  Cath 3/19: 3/3 bypass grafts patent, med Rx  Chronic systolic CHF  2/2 Ischemic cardiomyopathy  Echocardiogram 2/19: EF 30-35, Gr 1 DD  S/p AICD  Hypertension  Hyperlipidemia  Chronic kidney disease  Paroxysmal atrial fibrillation  Carotid artery disease  [place] 12/2018: bilat 40-59  Orthostatic hypotension  He was last seen by Dr. Caryl Comes in June 2020.  Today, he notes he is doing well.  He has not had chest pain, shortness of breath, dizziness, syncope, leg swelling.  Blood pressures are typically [id number] to 0000000 systolic.  The patient does not have symptoms concerning for COVID-19 infection (fever, chills, cough, or new shortness of breath).    Past Medical  History:  Diagnosis Date  . Abnormality of gait 02/07/2015  . CAD (coronary artery disease)    Hx of Inf MI treated with CABG in 1999 // Myoview 11/16: mild inf-lat ischemia, EF 34, high risk // LHC 3/19: 3/3 bypass grafts patent  . Carotid artery disease ([place])    Followed by [facility] (Dr. Trula Slade) // [place] in 2/19: bilat ICA 40-59  . Chronic systolic CHF (congestive heart failure) ([place]) 01/01/2010   Echo 12/30/2017: Mild LVH, EF 30-35, diffuse HK, grade 1 diastolic dysfunction, mildly reduced RVSF // Echo 04/12/2016:  EF 35-40, PASP 39, mild to moderate TR, mild LAE // Echo (12/14):  Base/mid inferior and inferolateral HK, EF 45%, mild LAE, mildly reduced RVSF  . CKD (chronic kidney disease)   . HLP (hyperkeratosis lenticularis perstans)   . HTN (hypertension)   . ICD (implantable cardiac defibrillator)  BSx   . Ischemic cardiomyopathy   . Myocardial infarction ([place]) 1999  . Near syncope 09/21/2014  . Orthostatic hypotension   . PAF (paroxysmal atrial fibrillation) ([place])    Past Surgical History:  Procedure Laterality Date  . CARDIAC CATHETERIZATION N/A 12/11/2015   Procedure: Left Heart Cath and Coronary Angiography;  Surgeon: Charolette Forward, MD;  Location: [place] [facility];  Service: Cardiovascular;  Laterality: N/A;  . CARDIAC DEFIBRILLATOR PLACEMENT    . CARDIAC DEFIBRILLATOR PLACEMENT  04/08/2016   NOT MRI SAFE (DOCUMENT SCANNED IN SYSTEM  . COLONOSCOPY  10/06/2007   Mild sigmoid diverticulosis. Small internal hemorrhoids.   . CORONARY ARTERY BYPASS GRAFT    .  ELBOW SURGERY Left    plastic bone replacement  . HERNIA REPAIR     x2  . LEFT HEART CATH AND CORS/GRAFTS ANGIOGRAPHY N/A 01/07/2018   Procedure: LEFT HEART CATH AND CORS/GRAFTS ANGIOGRAPHY;  Surgeon: Burnell Blanks, MD;  Location: [place] [facility];  Service: Cardiovascular;  Laterality: N/A;  . PERMANENT PACEMAKER GENERATOR CHANGE N/A 11/25/2013   Procedure: PERMANENT PACEMAKER GENERATOR CHANGE;  Surgeon: Deboraha Sprang, MD;   Location: [facility] CATH LAB;  Service: Cardiovascular;  Laterality: N/A;  . ROTATOR CUFF REPAIR Right 2006  . SINUS EXPLORATION  1987  . ULTRASOUND GUIDANCE FOR VASCULAR ACCESS  01/07/2018   Procedure: Ultrasound Guidance For Vascular Access;  Surgeon: Burnell Blanks, MD;  Location: [place] [facility];  Service: Cardiovascular;;     Current Meds  Medication Sig  . amLODipine (NORVASC) 5 MG tablet TAKE 1 TABLET (5 MG TOTAL) BY MOUTH DAILY.  Marland Kitchen aspirin 81 MG tablet Take 81 mg by mouth daily.   Marland Kitchen buPROPion (WELLBUTRIN XL) 300 MG 24 hr tablet Take 300 mg by mouth at bedtime.   . carvedilol (COREG) 6.25 MG tablet Take 1 tablet (6.25 mg total) by mouth 2 (two) times daily with a meal.  . clopidogrel (PLAVIX) 75 MG tablet Take 1 tablet (75 mg total) by mouth at bedtime.  . colchicine 0.6 MG tablet Take 0.6 mg by mouth daily.   . cyclobenzaprine (FLEXERIL) 5 MG tablet Take 5 mg by mouth 3 (three) times daily as needed for muscle spasms.  Marland Kitchen escitalopram (LEXAPRO) 10 MG tablet Take 10 mg by mouth at bedtime.   . famotidine (PEPCID) 20 MG tablet Take 20 mg by mouth 2 (two) times daily.  Marland Kitchen gabapentin (NEURONTIN) 300 MG capsule Take 600 mg by mouth at bedtime.   . nitroGLYCERIN (NITROSTAT) 0.4 MG SL tablet Place 1 tablet (0.4 mg total) under the tongue every 5 (five) minutes as needed for chest pain.  Marland Kitchen oxyCODONE-acetaminophen (PERCOCET) 10-325 MG tablet Take 1 tablet by mouth 2 (two) times daily as needed for pain.   . rosuvastatin (CRESTOR) 40 MG tablet Take 1 tablet (40 mg total) by mouth daily.  Marland Kitchen spironolactone (ALDACTONE) 25 MG tablet Take 25 mg by mouth daily.      Allergies:   Tramadol   Social History   Tobacco Use  . Smoking status: Former Smoker    Quit date: 09/19/1999    Years since quitting: 19.8  . Smokeless tobacco: Never Used  . Tobacco comment: quit in 1998  Substance Use Topics  . Alcohol use: No    Alcohol/week: 0.0 standard drinks    Comment: recovering alcoholic, quit  [id number] yrs ago  . Drug use: No      Family Hx: The patient's family history includes Heart attack in his brother, father, and mother; Heart disease in his brother, father, and mother.  ROS:   Please see the history of present illness.     All other systems reviewed and are negative.   Prior CV studies:   The following studies were reviewed today:   Carotid US 12/22/2018 Bilat ICA 40-59  Cardiac catheterization 01/07/2018 LAD ostial 40, proximal 40, mid 90 LCx proximal 100-CTO RCA proximal 100-CTO; RP AVB 16 SVG-distal RCA patent SVG-OM 2 patent LIMA-LAD patent 1. Severe triple vessel CAD s/p 3V CABG with 3/3 patent bypass grafts 2. Severe stenosis mid LAD. The mid and distal LAD fills from the LIMA graft 3. The mid Circumflex is chronically occluded. The  OM branch fills from the patent vein graft 4. The RCA is chronically occluded just beyond the ostium. The distal RCA and PDA fills from the patent vein graft. The native PDA has moderate stenosis beyond the graft insertion which is unchanged from cardiac cath in 2017.  Recommendations: Continue medical management of CAD.    Echocardiogram 12/30/2017 Mild LVH, EF 30-35, diffuse HK, grade 1 diastolic dysfunction, mildly reduced RVSF  Carotid US 12/14/2017 Final Interpretation: Right Carotid: Velocities in the right ICA are consistent with a 40-59% stenosis. Unable to obtain increased velocity as per exam of 06/01/2017 Left Carotid: Velocities in the left ICA are consistent with a 40-59% stenosis. Vertebrals: Both vertebral arteries were patent with antegrade flow.  Echo 04/12/2016 EF 35-40, PASP 39, mild to moderate TR, mild LAE  Myoview 09/12/15  Defect 1: There is a medium defect of mild severity present in the basal inferolateral and mid inferolateral location.  Findings consistent with ischemia.  The left ventricular ejection fraction is moderately decreased (30-44%).  Nuclear stress  EF: 34%.  This is a high risk study.  Nuclear (6/15):  Lateral wall infarction/scar, no ischemia, EF 30%  Echo (12/14):  Base/mid inferior and inferolateral HK, EF 45%, mild LAE, mildly reduced RVSF  LHC (12/11):  Inferior HK, EF 40-45%, ostial and distal left main 10-20%, ostial and proximal LAD 30-40% then occluded, proximal D3 20-25%, ostial circumflex 40-50% then occluded, ostial RCA occluded, SVG-OM patent, SVG-PDA 80-85% at the anastomosis, LIMA-LAD patent >>medical therapy of S-PDA unless fails then high risk PCI  Carotid US (11/15):  RICA 40-59%  Labs/Other Tests and Data Reviewed:    EKG:  No ECG reviewed.  Recent Labs: No results found for requested labs within last 8760 hours.   Recent Lipid Panel Lab Results  Component Value Date/Time   CHOL 156 12/11/2015 05:29 AM   TRIG 112 12/11/2015 05:29 AM   HDL 47 12/11/2015 05:29 AM   CHOLHDL 3.3 12/11/2015 05:29 AM   LDLCALC 87 12/11/2015 05:29 AM     Wt Readings from Last 3 Encounters:  07/05/19 170 lb (77.1 kg)  04/22/19 172 lb (78 kg)  02/23/19 170 lb (77.1 kg)     Objective:    Vital Signs:  BP (!) 155/90   Ht 5\' 10"  (1.778 m)   Wt 170 lb (77.1 kg)   BMI 24.39 kg/m    VITAL SIGNS:  reviewed GEN:  no acute distress RESPIRATORY:  No labored breathing NEURO:  Alert and oriented PSYCH:  Normal mood  ASSESSMENT & PLAN:    1. Chronic systolic CHF (congestive heart failure) (HCC) EF 30-35.  NYHA 2.  Volume status is stable.  Continue current medical therapy.  2. Coronary artery disease involving native coronary artery of native heart without angina pectoris History of inferior MI and CABG in 1999.  Cardiac catheterization March 2019 with patent bypass grafts.  He is not having anginal symptoms.  Continue current medical therapy.  3. Essential hypertension Fair control.  Continue current therapy.  Time:   Today, I have spent 14 minutes with the patient with telehealth technology discussing the  above problems.     Medication Adjustments/Labs and Tests Ordered: Current medicines are reviewed at length with the patient today.  Concerns regarding medicines are outlined above.   Tests Ordered: No orders of the defined types were placed in this encounter.   Medication Changes: Meds ordered this encounter  Medications  . sacubitril-valsartan (ENTRESTO) 97-103 MG    Sig: Take 1  tablet by mouth 2 (two) times daily.    Dispense:  180 tablet    Refill:  3    Order Specific Question:   Supervising Provider    Answer:   Lelon Perla [[id number]]    Follow Up:  Virtual Visit or In Person June 2021 with Dr. Caryl Comes  Signed, Richardson Dopp, PA-C  07/05/2019 4:05 PM    [facility]

## 2019-07-05 NOTE — Patient Instructions (Signed)
Medication Instructions:  No changes  If you need a refill on your cardiac medications before your next appointment, please call your pharmacy.   Lab work: None  If you have labs (blood work) drawn today and your tests are completely normal, you will receive your results only by: Marland Kitchen MyChart Message (if you have MyChart) OR . A paper copy in the mail If you have any lab test that is abnormal or we need to change your treatment, we will call you to review the results.  Testing/Procedures: None  Follow-Up: At [facility], you and your health needs are our priority.  As part of our continuing mission to provide you with exceptional heart care, we have created designated Provider Care Teams.  These Care Teams include your primary Cardiologist (physician) and Advanced Practice Providers (APPs -  Physician Assistants and Nurse Practitioners) who all work together to provide you with the care you need, when you need it. . Dr. Caryl Comes in June 2021  Any Other Special Instructions Will Be Listed Below (If Applicable). Delene Loll was refilled at your pharmacy today

## 2019-07-06 ENCOUNTER — Telehealth: Payer: Self-pay

## 2019-07-06 NOTE — Telephone Encounter (Signed)
I started a [employer] PA through covermymeds. Key: [id number]

## 2019-07-07 ENCOUNTER — Encounter: Payer: Self-pay | Admitting: Cardiology

## 2019-07-08 ENCOUNTER — Ambulatory Visit (INDEPENDENT_AMBULATORY_CARE_PROVIDER_SITE_OTHER): Payer: Medicare Other | Admitting: *Deleted

## 2019-07-08 ENCOUNTER — Other Ambulatory Visit (HOSPITAL_COMMUNITY)
Admission: RE | Admit: 2019-07-08 | Discharge: 2019-07-08 | Disposition: A | Discharge status: Home / Self Care | Payer: Medicare Other | Source: Ambulatory Visit | Attending: Orthopedic Surgery | Admitting: Orthopedic Surgery

## 2019-07-08 ENCOUNTER — Other Ambulatory Visit: Payer: Self-pay

## 2019-07-08 ENCOUNTER — Encounter (HOSPITAL_COMMUNITY): Payer: Self-pay

## 2019-07-08 ENCOUNTER — Encounter (HOSPITAL_COMMUNITY)
Admission: RE | Admit: 2019-07-08 | Discharge: 2019-07-08 | Disposition: A | Discharge status: Home / Self Care | Payer: Medicare Other | Source: Ambulatory Visit | Attending: Orthopedic Surgery | Admitting: Orthopedic Surgery

## 2019-07-08 DIAGNOSIS — I5022 Chronic systolic (congestive) heart failure: Secondary | ICD-10-CM | POA: Insufficient documentation

## 2019-07-08 DIAGNOSIS — I480 Paroxysmal atrial fibrillation: Secondary | ICD-10-CM | POA: Diagnosis not present

## 2019-07-08 DIAGNOSIS — N189 Chronic kidney disease, unspecified: Secondary | ICD-10-CM | POA: Insufficient documentation

## 2019-07-08 DIAGNOSIS — Z7982 Long term (current) use of aspirin: Secondary | ICD-10-CM | POA: Insufficient documentation

## 2019-07-08 DIAGNOSIS — F329 Major depressive disorder, single episode, unspecified: Secondary | ICD-10-CM | POA: Insufficient documentation

## 2019-07-08 DIAGNOSIS — Z95810 Presence of automatic (implantable) cardiac defibrillator: Secondary | ICD-10-CM | POA: Insufficient documentation

## 2019-07-08 DIAGNOSIS — I6529 Occlusion and stenosis of unspecified carotid artery: Secondary | ICD-10-CM | POA: Insufficient documentation

## 2019-07-08 DIAGNOSIS — Z01818 Encounter for other preprocedural examination: Secondary | ICD-10-CM | POA: Diagnosis not present

## 2019-07-08 DIAGNOSIS — Z20828 Contact with and (suspected) exposure to other viral communicable diseases: Secondary | ICD-10-CM | POA: Diagnosis not present

## 2019-07-08 DIAGNOSIS — Z7902 Long term (current) use of antithrombotics/antiplatelets: Secondary | ICD-10-CM | POA: Diagnosis not present

## 2019-07-08 DIAGNOSIS — Z79899 Other long term (current) drug therapy: Secondary | ICD-10-CM | POA: Diagnosis not present

## 2019-07-08 DIAGNOSIS — I255 Ischemic cardiomyopathy: Secondary | ICD-10-CM | POA: Diagnosis not present

## 2019-07-08 DIAGNOSIS — G5622 Lesion of ulnar nerve, left upper limb: Secondary | ICD-10-CM | POA: Insufficient documentation

## 2019-07-08 DIAGNOSIS — I130 Hypertensive heart and chronic kidney disease with heart failure and stage 1 through stage 4 chronic kidney disease, or unspecified chronic kidney disease: Secondary | ICD-10-CM | POA: Insufficient documentation

## 2019-07-08 DIAGNOSIS — Z87891 Personal history of nicotine dependence: Secondary | ICD-10-CM | POA: Insufficient documentation

## 2019-07-08 DIAGNOSIS — I2510 Atherosclerotic heart disease of native coronary artery without angina pectoris: Secondary | ICD-10-CM | POA: Insufficient documentation

## 2019-07-08 DIAGNOSIS — I252 Old myocardial infarction: Secondary | ICD-10-CM | POA: Insufficient documentation

## 2019-07-08 DIAGNOSIS — Z951 Presence of aortocoronary bypass graft: Secondary | ICD-10-CM | POA: Diagnosis not present

## 2019-07-08 DIAGNOSIS — E785 Hyperlipidemia, unspecified: Secondary | ICD-10-CM | POA: Diagnosis not present

## 2019-07-08 HISTORY — DX: Depression, unspecified: F32.A

## 2019-07-08 HISTORY — DX: Presence of automatic (implantable) cardiac defibrillator: Z95.810

## 2019-07-08 HISTORY — DX: Presence of dental prosthetic device (complete) (partial): Z97.2

## 2019-07-08 HISTORY — DX: Presence of cardiac pacemaker: Z95.0

## 2019-07-08 LAB — BASIC METABOLIC PANEL
Anion gap: 8 (ref 5–15)
BUN: 22 mg/dL (ref 8–23)
CO2: 24 mmol/L (ref 22–32)
Calcium: 9.5 mg/dL (ref 8.9–10.3)
Chloride: 106 mmol/L (ref 98–111)
Creatinine, Ser: 1.45 mg/dL — ABNORMAL HIGH (ref 0.61–1.24)
GFR calc Af Amer: 53 mL/min — ABNORMAL LOW (ref >60)
GFR calc non Af Amer: 46 mL/min — ABNORMAL LOW (ref >60)
Glucose, Bld: 97 mg/dL (ref 70–99)
Potassium: 4.5 mmol/L (ref 3.5–5.1)
Sodium: 138 mmol/L (ref 135–145)

## 2019-07-08 LAB — CBC
HCT: 43.8 % (ref 39.0–52.0)
Hemoglobin: 14.3 g/dL (ref 13.0–17.0)
MCH: 32.9 pg (ref 26.0–34.0)
MCHC: 32.6 g/dL (ref 30.0–36.0)
MCV: 100.7 fL — ABNORMAL HIGH (ref 80.0–100.0)
Platelets: 190 10*3/uL (ref 150–400)
RBC: 4.35 MIL/uL (ref 4.22–5.81)
RDW: 12.4 % (ref 11.5–15.5)
WBC: 7.5 10*3/uL (ref 4.0–10.5)
nRBC: 0 % (ref 0.0–0.2)

## 2019-07-08 NOTE — Progress Notes (Signed)
Anesthesia Chart Review:  Case: [id number] Date/Time: 07/12/19 1615   Procedure: Ulnar nerve release at the elbow with anterior transposition and flexor pronator lengthening, anterior interosseous nerve transfer to the deep motor branch of the ulnar nerve at the forearm, flexor digitorum profundus tendon transfer of the ring and small to middle fingers, ulnar nerve release at the wrist, Guyons canal (Left ) - 2 hrs   Anesthesia type: Choice   Pre-op diagnosis: Severe Left ulnar nerve neuropathy with atrophy   Location: [facility] OR ROOM 06 / [place] [facility]   Surgeon: Roseanne Kaufman, MD      DISCUSSION: Patient is a 78 year old male scheduled for the above procedure.  History includes former smoker (quit 2000), CAD (MI, s/p CABG 1999; 3/3 patent grafts, medical therapy 01/2018), PAF, near syncope (123456), chronic systolic CHF, ischemic cardiomyopathy, ICD (dual chamber ICD 03/14/05; last implant 11/25/13, [employer]), orthostatic hypotension, HLD, CKD, hyperkeratosis lenticularis perstans, carotid stenosis (40-59% 12/2018).   Virtual visit 07/05/19 at [facility] with Richardson Dopp, PA-C for follow-up. Volume status good and no anginal symptoms, so continued medical therapy recommended. Also had preoperative cardiology input outlined on 06/22/19 by Roby Lofts, PA-C, ".Marland KitchenMarland KitchenTherefore, based on ACC/AHA guidelines, the patient would be at acceptable risk for the planned procedure without further cardiovascular testing.   Per Dr. Caryl Comes, patient can hold aspirin and plavix 5-7 days prior to his upcoming orthopedic surgery. He should restart aspirin and plavix as soon as he is cleared to do so by his surgeon."   Last ASA/Plavix: 07/04/19.  Presurgical COVID-19 test from 07/08/19 is in process. If negative and otherwise no acute changes then I would anticipate that he can proceed as planned. See Marga Melnick, RN notation about ICD--perioperative form still pending from [facility].   VS: BP (!) 142/79   Pulse 64    Temp 36.5 C   Resp 20   Ht 5\' 10"  (1.778 m)   Wt 77.7 kg   SpO2 97%   BMI 24.59 kg/m     PROVIDERS: Ernestene Kiel, MD is PCP Virl Axe, MD is EP cardiologist Theotis Burrow, MD is vascular surgeon   LABS: Labs reviewed: Acceptable for surgery. Cr 1.45, but appears consistent with previous results in [facility]. (all labs ordered are listed, but only abnormal results are displayed)  Labs Reviewed  CBC - Abnormal; Notable for the following components:      Result Value   MCV 100.7 (*)    All other components within normal limits  BASIC METABOLIC PANEL - Abnormal; Notable for the following components:   Creatinine, Ser 1.45 (*)    GFR calc non Af Amer 46 (*)    GFR calc Af Amer 53 (*)    All other components within normal limits     EKG: 12/07/18 ([facility]): Electronic atrial pacemaker Cannot rule out inferior infarct, age undetermined   CV: Carotid US 12/22/18: Summary: - Right Carotid: Velocities in the right ICA are consistent with a 40-59% stenosis. - Left Carotid: Velocities in the left ICA are consistent with a 40-59% stenosis. - Vertebrals:  Bilateral vertebral arteries demonstrate antegrade flow. - Subclavians: Normal flow hemodynamics were seen in bilateral subclavian arteries.   Cardiac cath 01/07/18:  Prox RCA lesion is 100% stenosed.  SVG graft was visualized by angiography and is normal in caliber.  Post Atrio lesion is 60% stenosed.  Prox Cx lesion is 100% stenosed.  SVG graft was visualized by angiography and is normal in caliber.  LIMA graft was visualized  by angiography and is normal in caliber.  Ost LAD lesion is 40% stenosed.  Prox LAD lesion is 40% stenosed.  Prox LAD to Mid LAD lesion is 90% stenosed.  1. Severe triple vessel CAD s/p 3V CABG with 3/3 patent bypass grafts 2. Severe stenosis mid LAD. The mid and distal LAD fills from the LIMA graft 3. The mid Circumflex is chronically occluded. The OM branch fills from the  patent vein graft 4. The RCA is chronically occluded just beyond the ostium. The distal RCA and PDA fills from the patent vein graft. The native PDA has moderate stenosis beyond the graft insertion which is unchanged from cardiac cath in 2017.  Recommendations: Continue medical management of CAD.    Echo 12/30/17: Study Conclusions - Left ventricle: The cavity size was normal. Wall thickness was   increased in a pattern of mild LVH. Systolic function was   moderately to severely reduced. The estimated ejection fraction   was in the range of 30% to 35%. Diffuse hypokinesis. Doppler   parameters are consistent with abnormal left ventricular   relaxation (grade 1 diastolic dysfunction). - Right ventricle: Systolic function was mildly reduced. (Comparison: LVEF 35-40% 04/12/16; 35-39% 04/26/10)   Past Medical History:  Diagnosis Date  . Abnormality of gait 02/07/2015  . AICD (automatic cardioverter/defibrillator) present    [employer]  . CAD (coronary artery disease)    Hx of Inf MI treated with CABG in 1999 // Myoview 11/16: mild inf-lat ischemia, EF 34, high risk // LHC 3/19: 3/3 bypass grafts patent  . Carotid artery disease ([place])    Followed by [facility] (Dr. Trula Slade) // [place] in 2/19: bilat ICA 40-59  . Chronic systolic CHF (congestive heart failure) ([place]) 01/01/2010   Echo 12/30/2017: Mild LVH, EF 30-35, diffuse HK, grade 1 diastolic dysfunction, mildly reduced RVSF // Echo 04/12/2016:  EF 35-40, PASP 39, mild to moderate TR, mild LAE // Echo (12/14):  Base/mid inferior and inferolateral HK, EF 45%, mild LAE, mildly reduced RVSF  . CKD (chronic kidney disease)   . Depression   . HLP (hyperkeratosis lenticularis perstans)   . HTN (hypertension)   . ICD (implantable cardiac defibrillator)  BSx    [employer]  . Ischemic cardiomyopathy   . Myocardial infarction ([place]) 1999  . Near syncope 09/21/2014  . Orthostatic hypotension   . PAF (paroxysmal atrial fibrillation) ([place])   .  Presence of permanent cardiac pacemaker   . Wears partial dentures    upper    Past Surgical History:  Procedure Laterality Date  . CARDIAC CATHETERIZATION N/A 12/11/2015   Procedure: Left Heart Cath and Coronary Angiography;  Surgeon: Charolette Forward, MD;  Location: [place] [facility];  Service: Cardiovascular;  Laterality: N/A;  . CARDIAC DEFIBRILLATOR PLACEMENT     Chemical engineer  . CARDIAC DEFIBRILLATOR PLACEMENT  04/08/2016   NOT MRI SAFE (DOCUMENT SCANNED IN SYSTEM  . COLONOSCOPY  10/06/2007   Mild sigmoid diverticulosis. Small internal hemorrhoids.   . CORONARY ARTERY BYPASS GRAFT    . ELBOW SURGERY Left    plastic bone replacement  . HERNIA REPAIR     x2  . LEFT HEART CATH AND CORS/GRAFTS ANGIOGRAPHY N/A 01/07/2018   Procedure: LEFT HEART CATH AND CORS/GRAFTS ANGIOGRAPHY;  Surgeon: Burnell Blanks, MD;  Location: [place] [facility] LAB;  Service: Cardiovascular;  Laterality: N/A;  . PERMANENT PACEMAKER GENERATOR CHANGE N/A 11/25/2013   Procedure: PERMANENT PACEMAKER GENERATOR CHANGE;  Surgeon: Deboraha Sprang, MD;  Location:  [place] CATH LAB;  Service: Cardiovascular;  Laterality: N/A;   . ROTATOR CUFF REPAIR Right 2006  . SINUS EXPLORATION  1987  . ULTRASOUND GUIDANCE FOR VASCULAR ACCESS  01/07/2018   Procedure: Ultrasound Guidance For Vascular Access;  Surgeon: Burnell Blanks, MD;  Location: [place] CV LAB;  Service: Cardiovascular;;    MEDICATIONS: . amLODipine (NORVASC) 5 MG tablet  . aspirin 81 MG tablet  . carvedilol (COREG) 6.25 MG tablet  . clopidogrel (PLAVIX) 75 MG tablet  . colchicine 0.6 MG tablet  . cyclobenzaprine (FLEXERIL) 5 MG tablet  . escitalopram (LEXAPRO) 10 MG tablet  . famotidine (PEPCID) 20 MG tablet  . gabapentin (NEURONTIN) 300 MG capsule  . nitroGLYCERIN (NITROSTAT) 0.4 MG SL tablet  . oxyCODONE-acetaminophen (PERCOCET) 10-325 MG tablet  . rosuvastatin (CRESTOR) 40 MG tablet  . sacubitril-valsartan (ENTRESTO) 97-103 MG  .  spironolactone (ALDACTONE) 25 MG tablet   No current facility-administered medications for this encounter.      Myra Gianotti, PA-C Surgical Short Stay/Anesthesiology [facility] Phone [phone] [facility] Phone [phone] 07/08/2019 3:58 PM

## 2019-07-08 NOTE — Progress Notes (Addendum)
Patient denies shortness of breath, fever, cough and chest pain at PAT appointment  PCP - Dr. Ernestene Kiel Cardiologist - Richardson Dopp, PA-C @ [facility] and Dr Virl Axe  Chest x-ray - Denies EKG - 12/07/18 Stress Test - Denies ECHO - 12/30/17 Cardiac Cath - 01/07/18  ICD Pacemaker/Loop- [employer], Perioperative prescription for ICD faxed 07/08/19.  Last device check on 03/29/19 in Epic.  Cardiac clearance in Epic 06/21/19 from Dr. Caryl Comes.  [place] notified.  Joey requested that we call him by 3:30 pm if needed [phone].Marland Kitchen   Blood Thinner Instructions: Plavix - Hold for 5-7 days prior to surgery.  Last dose 07/04/19 per patient.  Aspirin Instructions: Hold aspirin 5-7 days prior to surgery. Last dose 07/04/19 per patient.  ERAS: Clears til 1:30pm Tuesday, day of surgery.  Ensure Pre-Surgery drink given.  Anesthesia review: Yes, Ebony Hail, PA  STOP now taking any Aspirin (unless otherwise instructed by your surgeon), Aleve, Naproxen, Ibuprofen, Motrin, Advil, Goody's, BC's, all herbal medications, fish oil, and all vitamins.   Coronavirus Screening Have you or your wife experienced the following symptoms:  Cough yes/no: No Fever (>100.76F)  yes/no: No Runny nose yes/no: No Sore throat yes/no: No Difficulty breathing/shortness of breath  yes/no: No  Have you or your wife traveled in the last 14 days and where? yes/no: No

## 2019-07-08 NOTE — Anesthesia Preprocedure Evaluation (Addendum)
Anesthesia Evaluation  Patient identified by MRN, date of birth, ID band Patient awake    Reviewed: Allergy & Precautions, NPO status , Patient's Chart, lab work & pertinent test results, reviewed documented beta blocker date and time   Airway Mallampati: II  TM Distance: >3 FB Neck ROM: Full    Dental  (+) Missing, Poor Dentition, Dental Advisory Given,    Pulmonary shortness of breath and with exertion, sleep apnea , former smoker,  Quit smoking 2000   Pulmonary exam normal        Cardiovascular hypertension, + CAD, + Past MI and +CHF  Normal cardiovascular exam+ pacemaker + Cardiac Defibrillator  Rhythm:Regular Rate:Normal  Echo 12/2017: - Left ventricle: The cavity size was normal. Wall thickness was   increased in a pattern of mild LVH. Systolic function was   moderately to severely reduced. The estimated ejection fraction   was in the range of 30% to 35%. Diffuse hypokinesis. Doppler   parameters are consistent with abnormal left ventricular   relaxation (grade 1 diastolic dysfunction). - Right ventricle: Systolic function was mildly reduced.  Normal valves  MI s/p CABG x3 in 1999   Cath 01/2018  Prox RCA lesion is 100% stenosed.  SVG graft was visualized by angiography and is normal in caliber.  Post Atrio lesion is 60% stenosed.  Prox Cx lesion is 100% stenosed.  SVG graft was visualized by angiography and is normal in caliber.  LIMA graft was visualized by angiography and is normal in caliber.  Ost LAD lesion is 40% stenosed.  Prox LAD lesion is 40% stenosed.  Prox LAD to Mid LAD lesion is 90% stenosed.   1. Severe triple vessel CAD s/p 3V CABG with 3/3 patent bypass grafts 2. Severe stenosis mid LAD. The mid and distal LAD fills from the LIMA graft 3. The mid Circumflex is chronically occluded. The OM branch fills from the patent vein graft 4. The RCA is chronically occluded just beyond the ostium.  The distal RCA and PDA fills from the patent vein graft. The native PDA has moderate stenosis beyond the graft insertion which is unchanged from cardiac cath in 2017.    ICD/PM sinc 2006 (gen change 2015)- [employer]   Neuro/Psych PSYCHIATRIC DISORDERS Depression negative neurological ROS     GI/Hepatic negative GI ROS, Neg liver ROS,   Endo/Other  negative endocrine ROS  Renal/GU Renal disease  negative genitourinary   Musculoskeletal  (+) Arthritis , Osteoarthritis,  Chronic back pain- takes percocet once per week   Abdominal Normal abdominal exam  (+)   Peds  Hematology negative hematology ROS (+)   Anesthesia Other Findings   Reproductive/Obstetrics                            Anesthesia Physical Anesthesia Plan  ASA: III  Anesthesia Plan: General   Post-op Pain Management:    Induction: Intravenous  PONV Risk Score and Plan: 2 and Ondansetron and Dexamethasone  Airway Management Planned: LMA  Additional Equipment: None  Intra-op Plan:   Post-operative Plan: Extubation in OR  Informed Consent: I have reviewed the patients History and Physical, chart, labs and discussed the procedure including the risks, benefits and alternatives for the proposed anesthesia with the patient or authorized representative who has indicated his/her understanding and acceptance.     Dental advisory given  Plan Discussed with: CRNA  Anesthesia Plan Comments: Film/video editor at bedside to interrogate and reprogram ICD/PM,  suggested using magnet instead of reprogramming. Does not need to be re-interrogated in PACU before discharge to home. )      Anesthesia Quick Evaluation

## 2019-07-08 NOTE — Progress Notes (Addendum)
[id number] - [place], [place] - [address] [facility] [address] Phone: [phone] Fax: [phone]      Your procedure is scheduled on Tuesday, 07/12/19.  Report to [facility]tr[facility] "A" at 2:30 P.M., and check in at the Admitting office.  Call this number if you have problems the morning of surgery:  [phone]  Call [phone] if you have any questions prior to your surgery date Monday-Friday 8am-4pm    Remember:  Do not eat after midnight the night before your surgery (Mon)  You may drink clear liquids until 1:30 pm the morning of your surgery.   Clear liquids allowed are: Water, Non-Citrus Juices (without pulp), Carbonated Beverages, Clear Tea, Black Coffee Only, and Gatorade   Please complete your PRE-SURGERY ENSURE that was provided to you by 1:30 pm... the morning of surgery.  Please, if able, drink it in one setting. DO NOT SIP.   Take these medicines the morning of surgery with A SIP OF WATER: amLODipine (NORVASC) carvedilol (COREG) famotidine (PEPCID) cyclobenzaprine (FLEXERIL) if needed nitroGLYCERIN if needed rosuvastatin (CRESTOR) oxyCODONE-acetaminophen (PERCOCET) if needed escitalopram (LEXAPRO)   STOP now taking any Aspirin (unless otherwise instructed by your surgeon), Aleve, Naproxen, Ibuprofen, Motrin, Advil, Goody's, BC's, all herbal medications, fish oil, and all vitamins.    The Morning of Surgery  Do not wear jewelry, make-up or nail polish.  Do not wear lotions, powders, or perfumes/colognes, or deodorant  Do not shave 48 hours prior to surgery.  Men may shave face and neck.  Do not bring valuables to the hospital.  [facility] is not responsible for any belongings or valuables.  If you are a smoker, DO NOT Smoke 24 hours prior to surgery  IF you wear a CPAP at night please bring your mask, tubing, and machine the morning of surgery   Remember that you must have someone to transport you home after your  surgery, and remain with you for 24 hours if you are discharged the same day.    Contacts, glasses, hearing aids, dentures or bridgework may not be worn into surgery.    For patients admitted to the hospital, discharge time will be determined by your treatment team.  Patients discharged the day of surgery will not be allowed to drive home.   Special instructions:   [facility]- Preparing For Surgery  Before surgery, you can play an important role. Because skin is not sterile, your skin needs to be as free of germs as possible. You can reduce the number of germs on your skin by washing with CHG (chlorahexidine gluconate) Soap before surgery.  CHG is an antiseptic cleaner which kills germs and bonds with the skin to continue killing germs even after washing.    Oral Hygiene is also important to reduce your risk of infection.  Remember - BRUSH YOUR TEETH THE MORNING OF SURGERY WITH YOUR REGULAR TOOTHPASTE  Please do not use if you have an allergy to CHG or antibacterial soaps. If your skin becomes reddened/irritated stop using the CHG.  Do not shave (including legs and underarms) for at least 48 hours prior to first CHG shower. It is OK to shave your face.  Please follow these instructions carefully.   1. Shower the NIGHT BEFORE SURGERY(Mon) and the MORNING OF SURGERY(Tues) with CHG Soap.   2. If you chose to wash your hair, wash your hair first as usual with your normal shampoo.  3. After you shampoo, rinse your hair and  body thoroughly to remove the shampoo.  4. Use CHG as you would any other liquid soap. You can apply CHG directly to the skin and wash gently with a scrungie or a clean washcloth.   5. Apply the CHG Soap to your body ONLY FROM THE NECK DOWN.  Do not use on open wounds or open sores. Avoid contact with your eyes, ears, mouth and genitals (private parts). Wash Face and genitals (private parts)  with your normal soap.   6. Wash thoroughly, paying special attention to the  area where your surgery will be performed.  7. Thoroughly rinse your body with warm water from the neck down.  8. DO NOT shower/wash with your normal soap after using and rinsing off the CHG Soap.  9. Pat yourself dry with a CLEAN TOWEL.  10. Wear CLEAN PAJAMAS to bed the night before surgery, wear comfortable clothes the morning of surgery  11. Place CLEAN SHEETS on your bed the night of your first shower and DO NOT SLEEP WITH PETS.    Day of Surgery:  Do not apply any deodorants/lotions. Please shower the morning of surgery with the CHG soap  Please wear clean clothes to the hospital/surgery center.   Remember to brush your teeth WITH YOUR REGULAR TOOTHPASTE.   Please read over the following fact sheets that you were given.

## 2019-07-09 LAB — NOVEL CORONAVIRUS, NAA (HOSP ORDER, SEND-OUT TO REF LAB; TAT 18-24 HRS): SARS-CoV-2, NAA: NOT DETECTED

## 2019-07-14 LAB — CUP PACEART REMOTE DEVICE CHECK
Battery Remaining Longevity: 72 mo
Battery Remaining Percentage: 84 %
Brady Statistic RA Percent Paced: 66 %
Brady Statistic RV Percent Paced: 2 %
Date Time Interrogation Session: 20200910044100
HighPow Impedance: 52 Ohm
Implantable Lead Implant Date: 20060512
Implantable Lead Implant Date: 20060512
Implantable Lead Location: 753859
Implantable Lead Location: 753860
Implantable Lead Model: 158
Implantable Lead Model: 5076
Implantable Lead Serial Number: 159477
Implantable Pulse Generator Implant Date: 20150123
Lead Channel Impedance Value: 432 Ohm
Lead Channel Impedance Value: 692 Ohm
Lead Channel Pacing Threshold Amplitude: 0.6 V
Lead Channel Pacing Threshold Amplitude: 0.8 V
Lead Channel Pacing Threshold Pulse Width: 0.4 ms
Lead Channel Pacing Threshold Pulse Width: 0.4 ms
Lead Channel Setting Pacing Amplitude: 2 V
Lead Channel Setting Pacing Amplitude: 2.4 V
Lead Channel Setting Pacing Pulse Width: 0.4 ms
Lead Channel Setting Sensing Sensitivity: 0.6 mV
Pulse Gen Serial Number: 112776

## 2019-07-15 ENCOUNTER — Telehealth: Payer: Self-pay | Admitting: Physician Assistant

## 2019-07-15 NOTE — Telephone Encounter (Signed)
I spoke with pt's wife and told them nothing abnormal was seen on transmission.  I asked them to follow up with primary care regarding recent falls.

## 2019-07-15 NOTE — Telephone Encounter (Signed)
Manual transmission received. Normal ICD function. Presenting rhythm Ap/Vs @ 65bpm. No atrial or ventricular arrhythmias since brief 1:1 SVT episode on 05/01/19. HR histograms appropriate. Lead trends stable. Remaining battery longevity estimated at 6 years. VT-1 zone detection rate programmed at 180bpm.

## 2019-07-15 NOTE — Telephone Encounter (Signed)
I spoke with pt's wife.  She reports pt got up during the night to go to the bathroom and fell to the floor. Did not pass out. Was not having any dizziness at the time. He felt OK yesterday but did have a busy day and only ate fruit and had little fluid intake yesterday. Today he was walking down the hall and felt woozy.  He attempted to get to his chair and fell next to the chair. He did not pass out but did lay on the floor for a couple of minutes prior to getting up.  Currently he is feeling back to normal. BP is 120/72. I asked him to send device transmission. I advised wife to make sure pt stays hydrated and eats well.  I also told his wife to let surgeon know about falling episodes.

## 2019-07-15 NOTE — Telephone Encounter (Signed)
New message     Patient is due to have hand surgery on Tuesday.  He was "woozy" last night and fell.  Today, he fell today also.  Bp was 113/96 after fall today.  Wife is concerned because surgery is Tuesday.  Wife not sure if she needs to call the surgeon or [place].  Please advise.

## 2019-07-18 ENCOUNTER — Inpatient Hospital Stay (HOSPITAL_COMMUNITY): Admission: RE | Admit: 2019-07-18 | Payer: Medicare Other | Source: Ambulatory Visit

## 2019-07-18 NOTE — Telephone Encounter (Signed)
Noted thanks [facility]

## 2019-07-18 NOTE — Progress Notes (Signed)
Spoke with pt's wife (DPR on file) about surgery time of arrival, need for Covid test tomorrow (instructed her to have him at [facility] hospital at 1:30 PM).

## 2019-07-19 ENCOUNTER — Other Ambulatory Visit (HOSPITAL_COMMUNITY)
Admission: RE | Admit: 2019-07-19 | Discharge: 2019-07-19 | Disposition: A | Discharge status: Home / Self Care | Payer: Medicare Other | Source: Ambulatory Visit | Attending: Orthopedic Surgery | Admitting: Orthopedic Surgery

## 2019-07-19 ENCOUNTER — Ambulatory Visit (HOSPITAL_COMMUNITY)
Admission: RE | Admit: 2019-07-19 | Discharge: 2019-07-19 | Disposition: A | Discharge status: Home / Self Care | Payer: Medicare Other | Attending: Orthopedic Surgery | Admitting: Orthopedic Surgery

## 2019-07-19 ENCOUNTER — Encounter (HOSPITAL_COMMUNITY): Payer: Self-pay | Admitting: Certified Registered"

## 2019-07-19 ENCOUNTER — Ambulatory Visit (HOSPITAL_COMMUNITY): Payer: Medicare Other | Admitting: Vascular Surgery

## 2019-07-19 ENCOUNTER — Encounter (HOSPITAL_COMMUNITY)
Admission: RE | Disposition: A | Discharge status: Home / Self Care | Payer: Self-pay | Source: Home / Self Care | Attending: Orthopedic Surgery

## 2019-07-19 ENCOUNTER — Ambulatory Visit (HOSPITAL_COMMUNITY): Payer: Medicare Other | Admitting: Anesthesiology

## 2019-07-19 DIAGNOSIS — I5022 Chronic systolic (congestive) heart failure: Secondary | ICD-10-CM | POA: Insufficient documentation

## 2019-07-19 DIAGNOSIS — G5622 Lesion of ulnar nerve, left upper limb: Secondary | ICD-10-CM | POA: Insufficient documentation

## 2019-07-19 DIAGNOSIS — Z7902 Long term (current) use of antithrombotics/antiplatelets: Secondary | ICD-10-CM | POA: Insufficient documentation

## 2019-07-19 DIAGNOSIS — Z95810 Presence of automatic (implantable) cardiac defibrillator: Secondary | ICD-10-CM | POA: Diagnosis not present

## 2019-07-19 DIAGNOSIS — N189 Chronic kidney disease, unspecified: Secondary | ICD-10-CM | POA: Insufficient documentation

## 2019-07-19 DIAGNOSIS — I255 Ischemic cardiomyopathy: Secondary | ICD-10-CM | POA: Diagnosis not present

## 2019-07-19 DIAGNOSIS — I252 Old myocardial infarction: Secondary | ICD-10-CM | POA: Insufficient documentation

## 2019-07-19 DIAGNOSIS — Z951 Presence of aortocoronary bypass graft: Secondary | ICD-10-CM | POA: Insufficient documentation

## 2019-07-19 DIAGNOSIS — Z87891 Personal history of nicotine dependence: Secondary | ICD-10-CM | POA: Insufficient documentation

## 2019-07-19 DIAGNOSIS — I2510 Atherosclerotic heart disease of native coronary artery without angina pectoris: Secondary | ICD-10-CM | POA: Diagnosis not present

## 2019-07-19 DIAGNOSIS — I130 Hypertensive heart and chronic kidney disease with heart failure and stage 1 through stage 4 chronic kidney disease, or unspecified chronic kidney disease: Secondary | ICD-10-CM | POA: Diagnosis not present

## 2019-07-19 DIAGNOSIS — I509 Heart failure, unspecified: Secondary | ICD-10-CM | POA: Diagnosis not present

## 2019-07-19 DIAGNOSIS — Z79899 Other long term (current) drug therapy: Secondary | ICD-10-CM | POA: Insufficient documentation

## 2019-07-19 DIAGNOSIS — S648X2A Injury of other nerves at wrist and hand level of left arm, initial encounter: Secondary | ICD-10-CM | POA: Diagnosis not present

## 2019-07-19 DIAGNOSIS — Z7982 Long term (current) use of aspirin: Secondary | ICD-10-CM | POA: Insufficient documentation

## 2019-07-19 DIAGNOSIS — F329 Major depressive disorder, single episode, unspecified: Secondary | ICD-10-CM | POA: Insufficient documentation

## 2019-07-19 DIAGNOSIS — Z20828 Contact with and (suspected) exposure to other viral communicable diseases: Secondary | ICD-10-CM | POA: Diagnosis not present

## 2019-07-19 HISTORY — PX: ULNAR NERVE TRANSPOSITION: SHX2595

## 2019-07-19 LAB — SARS CORONAVIRUS 2 BY RT PCR (HOSPITAL ORDER, PERFORMED IN ~~LOC~~ HOSPITAL LAB): SARS Coronavirus 2: NEGATIVE

## 2019-07-19 SURGERY — ULNAR NERVE DECOMPRESSION/TRANSPOSITION
Anesthesia: General | Laterality: Left

## 2019-07-19 MED ORDER — DEXAMETHASONE SODIUM PHOSPHATE 10 MG/ML IJ SOLN
INTRAMUSCULAR | Status: DC | PRN
  Administered 2019-07-19: 5 mg via INTRAVENOUS

## 2019-07-19 MED ORDER — ONDANSETRON HCL 4 MG/2ML IJ SOLN: 4.0000 mg | Freq: Once | INTRAMUSCULAR | Status: DC | PRN

## 2019-07-19 MED ORDER — LACTATED RINGERS IV SOLN
INTRAVENOUS | Status: DC
  Administered 2019-07-19: 15:00:00 via INTRAVENOUS

## 2019-07-19 MED ORDER — PROPOFOL 10 MG/ML IV BOLUS
INTRAVENOUS | Status: DC | PRN
  Administered 2019-07-19: 150 mg via INTRAVENOUS

## 2019-07-19 MED ORDER — LIDOCAINE 2% (20 MG/ML) 5 ML SYRINGE
INTRAMUSCULAR | Status: AC
  Filled 2019-07-19: qty 5

## 2019-07-19 MED ORDER — OXYCODONE HCL 5 MG PO TABS: 5.0000 mg | ORAL_TABLET | Freq: Once | ORAL | Status: DC | PRN

## 2019-07-19 MED ORDER — PROPOFOL 10 MG/ML IV BOLUS
INTRAVENOUS | Status: AC
  Filled 2019-07-19: qty 20

## 2019-07-19 MED ORDER — BUPIVACAINE HCL (PF) 0.25 % IJ SOLN
INTRAMUSCULAR | Status: DC | PRN
  Administered 2019-07-19: 20 mL

## 2019-07-19 MED ORDER — FENTANYL CITRATE (PF) 100 MCG/2ML IJ SOLN
25.0000 ug | INTRAMUSCULAR | Status: DC | PRN
  Administered 2019-07-19: 25 ug via INTRAVENOUS

## 2019-07-19 MED ORDER — DEXAMETHASONE SODIUM PHOSPHATE 10 MG/ML IJ SOLN
INTRAMUSCULAR | Status: AC
  Filled 2019-07-19: qty 1

## 2019-07-19 MED ORDER — LIDOCAINE HCL (CARDIAC) PF 100 MG/5ML IV SOSY
PREFILLED_SYRINGE | INTRAVENOUS | Status: DC | PRN
  Administered 2019-07-19: 70 mg via INTRATRACHEAL

## 2019-07-19 MED ORDER — PHENYLEPHRINE 40 MCG/ML (10ML) SYRINGE FOR IV PUSH (FOR BLOOD PRESSURE SUPPORT)
PREFILLED_SYRINGE | INTRAVENOUS | Status: DC | PRN
  Administered 2019-07-19: 80 ug via INTRAVENOUS
  Administered 2019-07-19: 40 ug via INTRAVENOUS
  Administered 2019-07-19 (×2): 80 ug via INTRAVENOUS

## 2019-07-19 MED ORDER — FENTANYL CITRATE (PF) 100 MCG/2ML IJ SOLN
INTRAMUSCULAR | Status: AC
  Filled 2019-07-19: qty 2

## 2019-07-19 MED ORDER — SODIUM CHLORIDE 0.9 % IV SOLN
INTRAVENOUS | Status: DC | PRN
  Administered 2019-07-19: 100 ug/min via INTRAVENOUS

## 2019-07-19 MED ORDER — FENTANYL CITRATE (PF) 250 MCG/5ML IJ SOLN
INTRAMUSCULAR | Status: AC
  Filled 2019-07-19: qty 5

## 2019-07-19 MED ORDER — FENTANYL CITRATE (PF) 250 MCG/5ML IJ SOLN
INTRAMUSCULAR | Status: DC | PRN
  Administered 2019-07-19 (×2): 25 ug via INTRAVENOUS

## 2019-07-19 MED ORDER — OXYCODONE HCL 5 MG/5ML PO SOLN: 5.0000 mg | Freq: Once | ORAL | Status: DC | PRN

## 2019-07-19 MED ORDER — ONDANSETRON HCL 4 MG/2ML IJ SOLN
INTRAMUSCULAR | Status: AC
  Filled 2019-07-19: qty 4

## 2019-07-19 MED ORDER — OXYCODONE-ACETAMINOPHEN 10-325 MG PO TABS: 1.0000 | ORAL_TABLET | Freq: Four times a day (QID) | ORAL | 0 refills | Status: AC | PRN

## 2019-07-19 MED ORDER — CHLORHEXIDINE GLUCONATE 4 % EX LIQD: 60.0000 mL | Freq: Once | CUTANEOUS | Status: DC

## 2019-07-19 MED ORDER — EPHEDRINE SULFATE 50 MG/ML IJ SOLN
INTRAMUSCULAR | Status: DC | PRN
  Administered 2019-07-19 (×4): 5 mg via INTRAVENOUS

## 2019-07-19 MED ORDER — CEPHALEXIN 500 MG PO CAPS: 500.0000 mg | ORAL_CAPSULE | Freq: Four times a day (QID) | ORAL | 0 refills | Status: AC

## 2019-07-19 MED ORDER — ONDANSETRON HCL 4 MG/2ML IJ SOLN
INTRAMUSCULAR | Status: DC | PRN
  Administered 2019-07-19: 4 mg via INTRAVENOUS

## 2019-07-19 MED ORDER — BUPIVACAINE HCL (PF) 0.25 % IJ SOLN
INTRAMUSCULAR | Status: AC
  Filled 2019-07-19: qty 30

## 2019-07-19 MED ORDER — CEFAZOLIN SODIUM-DEXTROSE 2-4 GM/100ML-% IV SOLN
INTRAVENOUS | Status: AC
  Filled 2019-07-19: qty 100

## 2019-07-19 MED ORDER — CEFAZOLIN SODIUM-DEXTROSE 2-4 GM/100ML-% IV SOLN
2.0000 g | INTRAVENOUS | Status: AC
  Administered 2019-07-19: 2 g via INTRAVENOUS

## 2019-07-19 MED ORDER — 0.9 % SODIUM CHLORIDE (POUR BTL) OPTIME
TOPICAL | Status: DC | PRN
  Administered 2019-07-19: 1000 mL

## 2019-07-19 SURGICAL SUPPLY — 47 items
BNDG ELASTIC 3X5.8 VLCR STR LF (GAUZE/BANDAGES/DRESSINGS) IMPLANT
BNDG ELASTIC 4X5.8 VLCR STR LF (GAUZE/BANDAGES/DRESSINGS) ×2 IMPLANT
BNDG GAUZE ELAST 4 BULKY (GAUZE/BANDAGES/DRESSINGS) ×4 IMPLANT
CORD BIPOLAR FORCEPS 12FT (ELECTRODE) ×3 IMPLANT
COVER SURGICAL LIGHT HANDLE (MISCELLANEOUS) ×3 IMPLANT
COVER WAND RF STERILE (DRAPES) ×3 IMPLANT
CUFF TOURN SGL QUICK 18X4 (TOURNIQUET CUFF) IMPLANT
CUFF TOURN SGL QUICK 24 (TOURNIQUET CUFF)
CUFF TRNQT CYL 24X4X16.5-23 (TOURNIQUET CUFF) IMPLANT
DECANTER SPIKE VIAL GLASS SM (MISCELLANEOUS) ×1 IMPLANT
DRAPE OEC MINIVIEW 54X84 (DRAPES) IMPLANT
DRAPE SURG 17X23 STRL (DRAPES) ×3 IMPLANT
GAUZE SPONGE 4X4 12PLY STRL (GAUZE/BANDAGES/DRESSINGS) IMPLANT
GAUZE XEROFORM 1X8 LF (GAUZE/BANDAGES/DRESSINGS) IMPLANT
GAUZE XEROFORM 5X9 LF (GAUZE/BANDAGES/DRESSINGS) ×4 IMPLANT
GLOVE BIOGEL M 8.0 STRL (GLOVE) ×3 IMPLANT
GLOVE SS BIOGEL STRL SZ 8 (GLOVE) ×1 IMPLANT
GLOVE SUPERSENSE BIOGEL SZ 8 (GLOVE) ×2
GOWN STRL REUS W/ TWL LRG LVL3 (GOWN DISPOSABLE) ×2 IMPLANT
GOWN STRL REUS W/ TWL XL LVL3 (GOWN DISPOSABLE) ×3 IMPLANT
GOWN STRL REUS W/TWL LRG LVL3 (GOWN DISPOSABLE) ×4
GOWN STRL REUS W/TWL XL LVL3 (GOWN DISPOSABLE) ×6
KIT BASIN OR (CUSTOM PROCEDURE TRAY) ×3 IMPLANT
KIT TURNOVER KIT B (KITS) ×3 IMPLANT
MANIFOLD NEPTUNE II (INSTRUMENTS) ×3 IMPLANT
NDL HYPO 25GX1X1/2 BEV (NEEDLE) IMPLANT
NEEDLE HYPO 25GX1X1/2 BEV (NEEDLE) IMPLANT
NS IRRIG 1000ML POUR BTL (IV SOLUTION) ×3 IMPLANT
PACK ORTHO EXTREMITY (CUSTOM PROCEDURE TRAY) ×3 IMPLANT
PAD ARMBOARD 7.5X6 YLW CONV (MISCELLANEOUS) ×6 IMPLANT
PAD CAST 4YDX4 CTTN HI CHSV (CAST SUPPLIES) IMPLANT
PADDING CAST COTTON 4X4 STRL (CAST SUPPLIES) ×8
SEALANT SURG COSEAL 4ML (VASCULAR PRODUCTS) ×2 IMPLANT
SOL PREP POV-IOD 4OZ 10% (MISCELLANEOUS) ×9 IMPLANT
SPLINT FIBERGLASS 3X35 (CAST SUPPLIES) ×2 IMPLANT
SUT ETHILON 8 0 BV130 4 (SUTURE) ×2 IMPLANT
SUT FIBERWIRE 3-0 18 TAPR NDL (SUTURE) ×3
SUT PROLENE 4 0 PS 2 18 (SUTURE) IMPLANT
SUT VIC AB 2-0 CT1 27 (SUTURE)
SUT VIC AB 2-0 CT1 TAPERPNT 27 (SUTURE) IMPLANT
SUTURE FIBERWR 3-0 18 TAPR NDL (SUTURE) IMPLANT
SYR CONTROL 10ML LL (SYRINGE) IMPLANT
TOWEL GREEN STERILE (TOWEL DISPOSABLE) ×3 IMPLANT
TOWEL GREEN STERILE FF (TOWEL DISPOSABLE) ×3 IMPLANT
TUBE CONNECTING 12'X1/4 (SUCTIONS)
TUBE CONNECTING 12X1/4 (SUCTIONS) IMPLANT
UNDERPAD 30X30 (UNDERPADS AND DIAPERS) ×3 IMPLANT

## 2019-07-19 NOTE — H&P (Signed)
Connor Wilkerson is an 78 y.o. male.   Chief Complaint: Advanced ulnar neuropathy left upper extremity HPI: Patient presents for a left ulnar nerve decompression transposition is necessary and nerve transfer.  I discussed with the patient relevant issues concerns and other aspects of his care plan.  We will plan to proceed with decompressing the ulnar nerve in hopes that we can bring some of the pain to a more tolerable level and function to improve.  We have discussed all issues with the patient.  Patient presents for evaluation and treatment of the of their upper extremity predicament. The patient denies neck, back, chest or  abdominal pain. The patient notes that they have no lower extremity problems. The patients primary complaint is noted. We are planning surgical care pathway for the upper extremity.  Past Medical History:  Diagnosis Date  . Abnormality of gait 02/07/2015  . AICD (automatic cardioverter/defibrillator) present    [employer]  . CAD (coronary artery disease)    Hx of Inf MI treated with CABG in 1999 // Myoview 11/16: mild inf-lat ischemia, EF 34, high risk // LHC 3/19: 3/3 bypass grafts patent  . Carotid artery disease ([place])    Followed by [facility] (Dr. Trula Slade) // [place] in 2/19: bilat ICA 40-59  . Chronic systolic CHF (congestive heart failure) ([place]) 01/01/2010   Echo 12/30/2017: Mild LVH, EF 30-35, diffuse HK, grade 1 diastolic dysfunction, mildly reduced RVSF // Echo 04/12/2016:  EF 35-40, PASP 39, mild to moderate TR, mild LAE // Echo (12/14):  Base/mid inferior and inferolateral HK, EF 45%, mild LAE, mildly reduced RVSF  . CKD (chronic kidney disease)   . Depression   . HLP (hyperkeratosis lenticularis perstans)   . HTN (hypertension)   . ICD (implantable cardiac defibrillator)  BSx    [employer]  . Ischemic cardiomyopathy   . Myocardial infarction ([place]) 1999  . Near syncope 09/21/2014  . Orthostatic hypotension   . PAF (paroxysmal atrial fibrillation)  ([place])   . Presence of permanent cardiac pacemaker   . Wears partial dentures    upper    Past Surgical History:  Procedure Laterality Date  . CARDIAC CATHETERIZATION N/A 12/11/2015   Procedure: Left Heart Cath and Coronary Angiography;  Surgeon: Charolette Forward, MD;  Location: [facility];  Service: Cardiovascular;  Laterality: N/A;  . CARDIAC DEFIBRILLATOR PLACEMENT     Chemical engineer  . CARDIAC DEFIBRILLATOR PLACEMENT  04/08/2016   NOT MRI SAFE (DOCUMENT SCANNED IN SYSTEM  . COLONOSCOPY  10/06/2007   Mild sigmoid diverticulosis. Small internal hemorrhoids.   . CORONARY ARTERY BYPASS GRAFT    . ELBOW SURGERY Left    plastic bone replacement  . HERNIA REPAIR     x2  . LEFT HEART CATH AND CORS/GRAFTS ANGIOGRAPHY N/A 01/07/2018   Procedure: LEFT HEART CATH AND CORS/GRAFTS ANGIOGRAPHY;  Surgeon: Burnell Blanks, MD;  Location: [facility];  Service: Cardiovascular;  Laterality: N/A;  . PERMANENT PACEMAKER GENERATOR CHANGE N/A 11/25/2013   Procedure: PERMANENT PACEMAKER GENERATOR CHANGE;  Surgeon: Deboraha Sprang, MD;  Location[facility];  Service: Cardiovascular;  Laterality: N/A;   . ROTATOR CUFF REPAIR Right 2006  . SINUS EXPLORATION  1987  . ULTRASOUND GUIDANCE FOR VASCULAR ACCESS  01/07/2018   Procedure: Ultrasound Guidance For Vascular Access;  Surgeon: Burnell Blanks, MD;  Location: [facility];  Service: Cardiovascular;;    Family History  Problem Relation Age of Onset  . Heart  disease Mother   . Heart attack Mother   . Heart disease Father   . Heart attack Father   . Heart disease Brother   . Heart attack Brother    Social History:  reports that he quit smoking about 19 years ago. His smoking use included pipe. He has never used smokeless tobacco. He reports that he does not drink alcohol or use drugs.  Allergies:  Allergies  Allergen Reactions  . Tramadol Other (See Comments)    Unknown     Medications Prior to Admission   Medication Sig Dispense Refill  . amLODipine (NORVASC) 5 MG tablet TAKE 1 TABLET (5 MG TOTAL) BY MOUTH DAILY. 90 tablet 3  . aspirin 81 MG tablet Take 81 mg by mouth daily.     . carvedilol (COREG) 6.25 MG tablet Take 1 tablet (6.25 mg total) by mouth 2 (two) times daily with a meal. 180 tablet 3  . clopidogrel (PLAVIX) 75 MG tablet Take 1 tablet (75 mg total) by mouth at bedtime. 90 tablet 3  . colchicine 0.6 MG tablet Take 0.6 mg by mouth daily.     . cyclobenzaprine (FLEXERIL) 5 MG tablet Take 5 mg by mouth 3 (three) times daily as needed for muscle spasms.    Marland Kitchen escitalopram (LEXAPRO) 10 MG tablet Take 10 mg by mouth 2 (two) times daily.     . famotidine (PEPCID) 20 MG tablet Take 20 mg by mouth 2 (two) times daily.    Marland Kitchen gabapentin (NEURONTIN) 300 MG capsule Take 600 mg by mouth at bedtime.     . nitroGLYCERIN (NITROSTAT) 0.4 MG SL tablet Place 1 tablet (0.4 mg total) under the tongue every 5 (five) minutes as needed for chest pain. 25 tablet 5  . oxyCODONE-acetaminophen (PERCOCET) 10-325 MG tablet Take 1 tablet by mouth every 8 (eight) hours as needed for pain.     . rosuvastatin (CRESTOR) 40 MG tablet Take 1 tablet (40 mg total) by mouth daily. 90 tablet 2  . sacubitril-valsartan (ENTRESTO) 97-103 MG Take 1 tablet by mouth 2 (two) times daily. 180 tablet 3  . spironolactone (ALDACTONE) 25 MG tablet Take 25 mg by mouth at bedtime.       Results for orders placed or performed during the hospital encounter of 07/19/19 (from the past 48 hour(s))  SARS Coronavirus 2 [facility] order, Performed in [facility] hospital lab) Nasopharyngeal Nasopharyngeal Swab     Status: None   Collection Time: 07/19/19  1:34 PM   Specimen: Nasopharyngeal Swab  Result Value Ref Range   SARS Coronavirus 2 NEGATIVE NEGATIVE    Comment: (NOTE) If result is NEGATIVE SARS-CoV-2 target nucleic acids are NOT DETECTED. The SARS-CoV-2 RNA is generally detectable in upper and lower  respiratory specimens during the  acute phase of infection. The lowest  concentration of SARS-CoV-2 viral copies this assay can detect is 250  copies / mL. A negative result does not preclude SARS-CoV-2 infection  and should not be used as the sole basis for treatment or other  patient management decisions.  A negative result may occur with  improper specimen collection / handling, submission of specimen other  than nasopharyngeal swab, presence of viral mutation(s) within the  areas targeted by this assay, and inadequate number of viral copies  (<250 copies / mL). A negative result must be combined with clinical  observations, patient history, and epidemiological information. If result is POSITIVE SARS-CoV-2 target nucleic acids are DETECTED. The SARS-CoV-2 RNA is generally detectable in upper  and lower  respiratory specimens dur ing the acute phase of infection.  Positive  results are indicative of active infection with SARS-CoV-2.  Clinical  correlation with patient history and other diagnostic information is  necessary to determine patient infection status.  Positive results do  not rule out bacterial infection or co-infection with other viruses. If result is PRESUMPTIVE POSTIVE SARS-CoV-2 nucleic acids MAY BE PRESENT.   A presumptive positive result was obtained on the submitted specimen  and confirmed on repeat testing.  While 2019 novel coronavirus  (SARS-CoV-2) nucleic acids may be present in the submitted sample  additional confirmatory testing may be necessary for epidemiological  and / or clinical management purposes  to differentiate between  SARS-CoV-2 and other Sarbecovirus currently known to infect humans.  If clinically indicated additional testing with an alternate test  methodology [phone]) is advised. The SARS-CoV-2 RNA is generally  detectable in upper and lower respiratory sp ecimens during the acute  phase of infection. The expected result is Negative. Fact Sheet for Patients:   StrictlyIdeas.no Fact Sheet for Healthcare Providers: BankingDealers.co.za This test is not yet approved or cleared by the [place] FDA and has been authorized for detection and/or diagnosis of SARS-CoV-2 by FDA under an Emergency Use Authorization (EUA).  This EUA will remain in effect (meaning this test can be used) for the duration of the COVID-19 declaration under Section 564(b)(1) of the Act, 21 U.S.C. section 360bbb-3(b)(1), unless the authorization is terminated or revoked sooner. Performed at [facility], [place] [address] [place], [place] [address]    No results found.  Review of Systems  Respiratory: Negative.   Cardiovascular: Negative.   Gastrointestinal: Negative.     Blood pressure (!) 158/70, pulse 71, temperature (!) 97.4 F (36.3 C), temperature source Oral, resp. rate 20, height 5\' 10"  (1.778 m), weight 77.1 kg, SpO2 96 %. Physical Exam  Advanced ulnar nerve neuropathy left upper extremity.  We will plan to proceed with surgical reconstruction as outlined. The patient is alert and oriented in no acute distress. The patient complains of pain in the affected upper extremity.  The patient is noted to have a normal HEENT exam. Lung fields show equal chest expansion and no shortness of breath. Abdomen exam is nontender without distention. Lower extremity examination does not show any fracture dislocation or blood clot symptoms. Pelvis is stable and the neck and back are stable and nontender. Assessment/Plan We will plan for ulnar nerve release at the elbow with transposition as necessary as well as deep motor branch ulnar nerve transfer via a donor utilizing the anterior interosseous nerve.  Patient and I discussed all issues.  He understands risk and benefits surgery and desires to proceed. We are planning surgery for your upper extremity. The risk and benefits of surgery to include risk of bleeding,  infection, anesthesia,  damage to normal structures and failure of the surgery to accomplish its intended goals of relieving symptoms and restoring function have been discussed in detail. With this in mind we plan to proceed. I have specifically discussed with the patient the pre-and postoperative regime and the dos and don'ts and risk and benefits in great detail. Risk and benefits of surgery also include risk of dystrophy(CRPS), chronic nerve pain, failure of the healing process to go onto completion and other inherent risks of surgery The relavent the pathophysiology of the disease/injury process, as well as the alternatives for treatment and postoperative course of action has been discussed in great detail with  the patient who desires to proceed.  We will do everything in our power to help you (the patient) restore function to the upper extremity. It is a pleasure to see this patient today.   Willa Frater III, MD 07/19/2019, 3:38 PM

## 2019-07-19 NOTE — Anesthesia Procedure Notes (Signed)
Procedure Name: LMA Insertion Date/Time: 07/19/2019 4:23 PM Performed by: Myna Bright, CRNA Pre-anesthesia Checklist: Patient identified, Emergency Drugs available, Suction available and Patient being monitored Patient Re-evaluated:Patient Re-evaluated prior to induction Oxygen Delivery Method: Circle system utilized Preoxygenation: Pre-oxygenation with 100% oxygen Induction Type: IV induction Ventilation: Mask ventilation without difficulty LMA: LMA inserted LMA Size: 4.0 Tube type: Oral Number of attempts: 1 Placement Confirmation: positive ETCO2 and breath sounds checked- equal and bilateral Tube secured with: Tape Dental Injury: Teeth and Oropharynx as per pre-operative assessment

## 2019-07-19 NOTE — Progress Notes (Signed)
75 mcg Fent wasted with Mickel Baas RN, unable in pyxis due to pt DC

## 2019-07-19 NOTE — Discharge Instructions (Signed)

## 2019-07-19 NOTE — Transfer of Care (Signed)
Immediate Anesthesia Transfer of Care Note  Patient: Connor Wilkerson  Procedure(s) Performed: Ulnar nerve release at the elbow with anterior transposition and flexor pronator lengthening, anterior interosseous nerve transfer to the deep motor branch of the ulnar nerve at the forearm, flexor digitorum profundus tendon transfer of the ring and small to middle fingers, ulnar nerve release at the wrist, Guyons canal (Left )  Patient Location: PACU  Anesthesia Type:General  Level of Consciousness: awake  Airway & Oxygen Therapy: Patient Spontanous Breathing and Patient connected to face mask oxygen  Post-op Assessment: Report given to RN and Post -op Vital signs reviewed and stable  Post vital signs: Reviewed and stable  Last Vitals:  Vitals Value Taken Time  BP 107/60 07/19/19 1803  Temp    Pulse 56 07/19/19 1804  Resp 12 07/19/19 1806  SpO2 91 % 07/19/19 1804  Vitals shown include unvalidated device data.  Last Pain:  Vitals:   07/19/19 1803  TempSrc:   PainSc: (P) Asleep         Complications: No apparent anesthesia complications

## 2019-07-19 NOTE — Op Note (Signed)
Operative note July 19, 2019  Roseanne Kaufman MD.  Preoperative diagnosis severe ulnar nerve neuropathy left upper extremity with first dorsal interosseous muscle advanced abnormality/intrinsic loss  Postop diagnosis same.  Operative procedure #1 ulnar nerve release at the elbow with history of chronic deformity in the elbow due to fracture in childhood #2 flexor pronator release and lengthening left elbow #3 anterior interosseous nerve transfer to the deep motor branch of the ulnar nerve through a separate incision in the form this was a supercharged AIN 2 deep motor branch ulnar nerve nerve transfer #4 FDP small and ring finger transfer/tendon tenodesis transfer to the middle finger FDP at the forearm #5 Guyon's canal release left wrist region through a separate incision  Surgeon Roseanne Kaufman  Anesthesia General  Estimated blood loss minimal  Indications for the procedure patient is a pleasant gentleman who presents to the above-mentioned diagnosis have counseled him in regards to risk and benefits of surgery.  I have discussed the timeframe duration of recovery and other issues germane to the upper extremity predicament.  We can give his nerve a better position better oxygen and blood flow in hopes that he can have some degree of meaningful recovery and try to jumpstart the deep motor branch with a AI and transfer.  He understands this.  Operative findings this patient has severe compression at the elbow.  The wrist compression was not overly advanced on interoperative explained inspection.  I feel that the most pressing issue correlated with the EMG nerve conduction velocity with compression at the elbow.  Operative procedure patient was seen by myself and anesthesia taken to the operative theater underwent smooth induction of general anesthesia was prepped with Hibiclens scrub as a pre-scrub followed by attainment surgical Betadine scrub and paint by myself.  Outlined marks were  made following this arm was elevated tourniquet was insufflated to a 50 mmHg and the patient then underwent a curvilinear incision at the elbow.  He had marked deformity at the elbow making landmarks difficult.  At this time I released the arcade of Struthers.  I released the medial intermuscular septum and excised this as well as Osborne's ligament 2 heads of the FCU and the cubital tunnel.  He had marked constriction primarily at Osborne's ligament.  I resected portions of the flexor pronator fascia and made sure he was completely released.  This was in essence a flexor pronator lengthening with fascial excision.  Following this we irrigated copiously and closed wound.  This was an ulnar nerve release at the elbow.  I decided not to manipulate the nerve and attempting transposition as there is no meaningful medial epicondyles that was normal in its appearance.  Thus I felt the excessive manipulation may cause some devascularization of the nerve.  I checked this with flexion extension and there is no subluxation.  Following this the wound was closed as noted above and all looked quite well I then turned attention towards Guyon's canal modified Bruner incision was made assessments carried down and the canal of Guyon was released.  I dissected the ulnar nerve to identify the deep motor branch and superficial branch takeoff as well as the ulnar artery I was very careful with the neurovascular structures and released this nicely.  I irrigated and then closed the wound without difficulty.  Following this a third incision was made in the forearm dissection was carried down the ulnar nerve and ulnar artery were identified fascial constraints were released following this the identification of the deep  motor branch of the ulnar nerve at its junction with the dorsal sensory branch of the ulnar nerve takeoff was visualized.  This was a dissection which was developed nicely with facial nerve dissector under 4.5 expanded  loupe magnification.  Following this identified the FDP to the small and ring finger performed a tenodesis to the middle finger all went quite well this was performed with FiberWire suture.  This was to try and gain some power about the flexion apparatus of the hand for the patient.  Following the tendon transfer we then identified deeply the anterior interosseous nerve.  This was dissected to its muscle inhabitant at the pronator and was then severed.  A small amount of fibrin glue was placed in this area the nerve was then transferred over to the deep motor branch to the ulnar nerve I made a slit in the epineurium and then very carefully with 8-0 nylon suture sutured this in place.  This is the jumpstart the deep motor branch of the ulnar nerve and give the patient a better sense of motor function long-term hopefully.  Small amount of Tisseel/fibrin glue was placed and there were no complicating features.  Following this we reviewed copiously with the tourniquet deflated and closed wound with Prolene.  The patient tolerated this well.  All sponge needle instrument counts course correct he had soft compartments good refill and a splint was applied.  We will rehab him according to our standard protocol.  All questions have been encouraged and answered he will continue oxycodone and Keflex x7 days allow him to be discharged tonight and begin off his regular medicines.  His pleasure seeing them dissipate in his care plan.  He had marked abnormality at the elbow.  Connor Mcglasson MD

## 2019-07-20 ENCOUNTER — Encounter: Payer: Self-pay | Admitting: Cardiology

## 2019-07-20 ENCOUNTER — Encounter (HOSPITAL_COMMUNITY): Payer: Self-pay | Admitting: Orthopedic Surgery

## 2019-07-20 NOTE — Progress Notes (Signed)
Remote ICD transmission.   

## 2019-07-20 NOTE — Anesthesia Postprocedure Evaluation (Signed)
Anesthesia Post Note  Patient: Connor Wilkerson  Procedure(s) Performed: Ulnar nerve release at the elbow with anterior transposition and flexor pronator lengthening, anterior interosseous nerve transfer to the deep motor branch of the ulnar nerve at the forearm, flexor digitorum profundus tendon transfer of the ring and small to middle fingers, ulnar nerve release at the wrist, Guyons canal (Left )     Patient location during evaluation: PACU Anesthesia Type: General Level of consciousness: awake and alert, oriented and patient cooperative Pain management: pain level controlled Vital Signs Assessment: post-procedure vital signs reviewed and stable Respiratory status: spontaneous breathing, nonlabored ventilation and respiratory function stable Cardiovascular status: blood pressure returned to baseline and stable Postop Assessment: no apparent nausea or vomiting Anesthetic complications: no    Last Vitals:  Vitals:   07/19/19 2003 07/19/19 2005  BP: (!) 143/82   Pulse:  (!) 41  Resp: 13 14  Temp:    SpO2:  95%    Last Pain:  Vitals:   07/19/19 2000  TempSrc:   PainSc: 0-No pain                 Pervis Hocking

## 2019-07-26 ENCOUNTER — Telehealth: Payer: Self-pay

## 2019-07-26 NOTE — Telephone Encounter (Signed)
Pt wife sent a transmissions. The pt is not feeling well. The pt is not steady on his feet and not thinking straight. Pt had surgery on his hands a week ago and taking a lot of medicines and antibiotics than usual. Pt wife wants someone to look at the transmission and to see if anything on it. I told her I will have the nurse look at it and give them a call back. Pt wife states if it something on the transmission and she needs to take him to the hospital to give her a call.

## 2019-07-26 NOTE — Telephone Encounter (Signed)
Reviewed transmission and no alerts or events, device function wnl. Spoke with patient's wife(per DPL). Patient is having balance issues, decreased LOC and confusion, Wife unsure if patient has taken more pain medicine than prescribed. Patient had surgery on his arm 07/21/2019 and has been taking oxycodone for pain. Recommended patient be taken to ED for evaluation due to change in LOC and ataxia described by family member. Wife will call EMS for transport.

## 2019-08-09 DIAGNOSIS — G5622 Lesion of ulnar nerve, left upper limb: Secondary | ICD-10-CM | POA: Diagnosis not present

## 2019-08-12 NOTE — Telephone Encounter (Signed)
**Note De-Identified Chabely Norby Obfuscation** Per message received from covermymeds:  [employer] (Key: [id number])  Rx #[id number]  Entresto 97-103MG  tablets  Form [employer] Electronic PA Form (NCPDP)  Determination  Favorable  1 month ago  Message from Plan Your PA request has been approved. Additional information will be provided in the approval communication.

## 2019-08-29 DIAGNOSIS — M25522 Pain in left elbow: Secondary | ICD-10-CM | POA: Diagnosis not present

## 2019-10-07 ENCOUNTER — Ambulatory Visit (INDEPENDENT_AMBULATORY_CARE_PROVIDER_SITE_OTHER): Payer: Medicare Other | Admitting: *Deleted

## 2019-10-07 DIAGNOSIS — Z95810 Presence of automatic (implantable) cardiac defibrillator: Secondary | ICD-10-CM | POA: Diagnosis not present

## 2019-10-08 LAB — CUP PACEART REMOTE DEVICE CHECK
Battery Remaining Longevity: 72 mo
Battery Remaining Percentage: 83 %
Brady Statistic RA Percent Paced: 62 %
Brady Statistic RV Percent Paced: 1 %
Date Time Interrogation Session: 20201204004100
HighPow Impedance: 48 Ohm
Implantable Lead Implant Date: 20060512
Implantable Lead Implant Date: 20060512
Implantable Lead Location: 753859
Implantable Lead Location: 753860
Implantable Lead Model: 158
Implantable Lead Model: 5076
Implantable Lead Serial Number: 159477
Implantable Pulse Generator Implant Date: 20150123
Lead Channel Impedance Value: 413 Ohm
Lead Channel Impedance Value: 612 Ohm
Lead Channel Pacing Threshold Amplitude: 0.6 V
Lead Channel Pacing Threshold Amplitude: 0.8 V
Lead Channel Pacing Threshold Pulse Width: 0.4 ms
Lead Channel Pacing Threshold Pulse Width: 0.4 ms
Lead Channel Setting Pacing Amplitude: 2 V
Lead Channel Setting Pacing Amplitude: 2.4 V
Lead Channel Setting Pacing Pulse Width: 0.4 ms
Lead Channel Setting Sensing Sensitivity: 0.6 mV
Pulse Gen Serial Number: 112776

## 2019-12-26 DIAGNOSIS — Z6825 Body mass index (BMI) 25.0-25.9, adult: Secondary | ICD-10-CM | POA: Diagnosis not present

## 2019-12-26 DIAGNOSIS — B029 Zoster without complications: Secondary | ICD-10-CM | POA: Diagnosis not present

## 2020-01-06 ENCOUNTER — Ambulatory Visit (INDEPENDENT_AMBULATORY_CARE_PROVIDER_SITE_OTHER): Payer: Medicare Other | Admitting: *Deleted

## 2020-01-06 DIAGNOSIS — Z95810 Presence of automatic (implantable) cardiac defibrillator: Secondary | ICD-10-CM | POA: Diagnosis not present

## 2020-01-06 LAB — CUP PACEART REMOTE DEVICE CHECK
Battery Remaining Longevity: 66 mo
Battery Remaining Percentage: 76 %
Brady Statistic RA Percent Paced: 65 %
Brady Statistic RV Percent Paced: 1 %
Date Time Interrogation Session: 20210305004200
HighPow Impedance: 48 Ohm
Implantable Lead Implant Date: 20060512
Implantable Lead Implant Date: 20060512
Implantable Lead Location: 753859
Implantable Lead Location: 753860
Implantable Lead Model: 158
Implantable Lead Model: 5076
Implantable Lead Serial Number: 159477
Implantable Pulse Generator Implant Date: 20150123
Lead Channel Impedance Value: 408 Ohm
Lead Channel Impedance Value: 552 Ohm
Lead Channel Pacing Threshold Amplitude: 0.6 V
Lead Channel Pacing Threshold Amplitude: 0.8 V
Lead Channel Pacing Threshold Pulse Width: 0.4 ms
Lead Channel Pacing Threshold Pulse Width: 0.4 ms
Lead Channel Setting Pacing Amplitude: 2 V
Lead Channel Setting Pacing Amplitude: 2.4 V
Lead Channel Setting Pacing Pulse Width: 0.4 ms
Lead Channel Setting Sensing Sensitivity: 0.6 mV
Pulse Gen Serial Number: 112776

## 2020-01-06 NOTE — Progress Notes (Signed)
ICD Remote  

## 2020-01-25 DIAGNOSIS — Z1339 Encounter for screening examination for other mental health and behavioral disorders: Secondary | ICD-10-CM | POA: Diagnosis not present

## 2020-01-25 DIAGNOSIS — Z0001 Encounter for general adult medical examination with abnormal findings: Secondary | ICD-10-CM | POA: Diagnosis not present

## 2020-01-25 DIAGNOSIS — Z1331 Encounter for screening for depression: Secondary | ICD-10-CM | POA: Diagnosis not present

## 2020-01-25 DIAGNOSIS — R7303 Prediabetes: Secondary | ICD-10-CM | POA: Diagnosis not present

## 2020-01-25 DIAGNOSIS — E785 Hyperlipidemia, unspecified: Secondary | ICD-10-CM | POA: Diagnosis not present

## 2020-01-25 DIAGNOSIS — Z79899 Other long term (current) drug therapy: Secondary | ICD-10-CM | POA: Diagnosis not present

## 2020-01-25 DIAGNOSIS — Z6824 Body mass index (BMI) 24.0-24.9, adult: Secondary | ICD-10-CM | POA: Diagnosis not present

## 2020-02-09 DIAGNOSIS — R7989 Other specified abnormal findings of blood chemistry: Secondary | ICD-10-CM | POA: Diagnosis not present

## 2020-02-22 ENCOUNTER — Telehealth: Payer: Self-pay

## 2020-02-22 NOTE — Telephone Encounter (Signed)
Latitude alert received-  Pt had long episode of AF last more than 6 hours on 02/20/20.  Pt does have remote history of PAF, current meds include ASA 81mg , Carvedilol 6.125mg  BID.  Presenting rhythm ASVS 72.  Spoke with pt, he reports that on Monday the only medication he took was ASA81 mg.  He had dizzy spells during the day.  Pt currently denies any cardiac symptoms and confirms that he has taken his scheduled medications today.    Stressed medication compliance with patient.  Advised will forward to MD for review of long AF episode, not on [place].          Presenting Rhythm

## 2020-02-25 NOTE — Telephone Encounter (Signed)
Ladies, with prolonged AFib ( no longer sure what the right duration is any longer) he needs an OV to discuss anticoagulation  I think the ASSERT data are giving way to the [place] and if AFib clinic is comfortable with those data and implications that would be great ; o/w I am glad to work him in to disucss in the next couple of weeks when I get back  Thanks SK

## 2020-02-28 NOTE — Telephone Encounter (Signed)
Left message to schedule follow up per Dr. Caryl Comes.

## 2020-02-28 NOTE — Telephone Encounter (Signed)
appt made for 4/28 per patient request.

## 2020-02-29 ENCOUNTER — Other Ambulatory Visit: Payer: Self-pay

## 2020-02-29 ENCOUNTER — Ambulatory Visit (HOSPITAL_COMMUNITY)
Admission: RE | Admit: 2020-02-29 | Discharge: 2020-02-29 | Disposition: A | Discharge status: Home / Self Care | Payer: Medicare Other | Source: Ambulatory Visit | Attending: Physician Assistant | Admitting: Physician Assistant

## 2020-02-29 ENCOUNTER — Encounter (HOSPITAL_COMMUNITY): Payer: Self-pay | Admitting: Physician Assistant

## 2020-02-29 VITALS — BP 110/60 | HR 60 | Ht 70.0 in | Wt 165.4 lb

## 2020-02-29 DIAGNOSIS — I5022 Chronic systolic (congestive) heart failure: Secondary | ICD-10-CM | POA: Diagnosis not present

## 2020-02-29 DIAGNOSIS — Z8249 Family history of ischemic heart disease and other diseases of the circulatory system: Secondary | ICD-10-CM | POA: Insufficient documentation

## 2020-02-29 DIAGNOSIS — N189 Chronic kidney disease, unspecified: Secondary | ICD-10-CM | POA: Insufficient documentation

## 2020-02-29 DIAGNOSIS — Z7901 Long term (current) use of anticoagulants: Secondary | ICD-10-CM | POA: Insufficient documentation

## 2020-02-29 DIAGNOSIS — D6869 Other thrombophilia: Secondary | ICD-10-CM | POA: Diagnosis not present

## 2020-02-29 DIAGNOSIS — Z79899 Other long term (current) drug therapy: Secondary | ICD-10-CM | POA: Diagnosis not present

## 2020-02-29 DIAGNOSIS — I255 Ischemic cardiomyopathy: Secondary | ICD-10-CM | POA: Diagnosis not present

## 2020-02-29 DIAGNOSIS — Z95810 Presence of automatic (implantable) cardiac defibrillator: Secondary | ICD-10-CM | POA: Insufficient documentation

## 2020-02-29 DIAGNOSIS — Z87891 Personal history of nicotine dependence: Secondary | ICD-10-CM | POA: Insufficient documentation

## 2020-02-29 DIAGNOSIS — Z951 Presence of aortocoronary bypass graft: Secondary | ICD-10-CM | POA: Diagnosis not present

## 2020-02-29 DIAGNOSIS — I252 Old myocardial infarction: Secondary | ICD-10-CM | POA: Insufficient documentation

## 2020-02-29 DIAGNOSIS — I480 Paroxysmal atrial fibrillation: Secondary | ICD-10-CM

## 2020-02-29 DIAGNOSIS — I4891 Unspecified atrial fibrillation: Secondary | ICD-10-CM | POA: Insufficient documentation

## 2020-02-29 DIAGNOSIS — D6859 Other primary thrombophilia: Secondary | ICD-10-CM | POA: Insufficient documentation

## 2020-02-29 DIAGNOSIS — I130 Hypertensive heart and chronic kidney disease with heart failure and stage 1 through stage 4 chronic kidney disease, or unspecified chronic kidney disease: Secondary | ICD-10-CM | POA: Diagnosis not present

## 2020-02-29 DIAGNOSIS — I2510 Atherosclerotic heart disease of native coronary artery without angina pectoris: Secondary | ICD-10-CM | POA: Insufficient documentation

## 2020-02-29 LAB — BASIC METABOLIC PANEL
Anion gap: 9 (ref 5–15)
BUN: 23 mg/dL (ref 8–23)
CO2: 22 mmol/L (ref 22–32)
Calcium: 8.8 mg/dL — ABNORMAL LOW (ref 8.9–10.3)
Chloride: 109 mmol/L (ref 98–111)
Creatinine, Ser: 1.7 mg/dL — ABNORMAL HIGH (ref 0.61–1.24)
GFR calc Af Amer: 44 mL/min — ABNORMAL LOW (ref >60)
GFR calc non Af Amer: 38 mL/min — ABNORMAL LOW (ref >60)
Glucose, Bld: 144 mg/dL — ABNORMAL HIGH (ref 70–99)
Potassium: 4.4 mmol/L (ref 3.5–5.1)
Sodium: 140 mmol/L (ref 135–145)

## 2020-02-29 LAB — CBC
HCT: 39.4 % (ref 39.0–52.0)
Hemoglobin: 12.7 g/dL — ABNORMAL LOW (ref 13.0–17.0)
MCH: 33 pg (ref 26.0–34.0)
MCHC: 32.2 g/dL (ref 30.0–36.0)
MCV: 102.3 fL — ABNORMAL HIGH (ref 80.0–100.0)
Platelets: 182 10*3/uL (ref 150–400)
RBC: 3.85 MIL/uL — ABNORMAL LOW (ref 4.22–5.81)
RDW: 12.8 % (ref 11.5–15.5)
WBC: 7.2 10*3/uL (ref 4.0–10.5)
nRBC: 0 % (ref 0.0–0.2)

## 2020-02-29 MED ORDER — APIXABAN 5 MG PO TABS: 5.0000 mg | ORAL_TABLET | Freq: Two times a day (BID) | ORAL | 3 refills | Status: DC

## 2020-02-29 NOTE — Patient Instructions (Signed)
STOP aspirin  START eliquis 5mg  twice a day   Follow with Dr. Caryl Comes in June.

## 2020-02-29 NOTE — Progress Notes (Signed)
Primary Care Physician: Ernestene Kiel, MD Primary Electrophysiologist: Dr Caryl Comes Referring Physician: Dr Hinda Kehr is a 79 y.o. male with a history of CAD, chronic systolic CHF s/p ICD, HTN, HLD, CKD, carotid disease, orthostatic hypotension, and paroxysmal atrial fibrillation who presents for follow up in the [facility]. The patient was diagnosed with atrial fibrillation 02/22/20 after the device clinic received and alert for an afib episode lasting >6 hours. He did have some dizziness during that day but did not have any other symptoms. He denies heart racing or palpitations. Patient has a CHADS2VASC score of 5. There were no specific triggers that the patient could identify.   Today, he denies symptoms of palpitations, chest pain, shortness of breath, orthopnea, PND, lower extremity edema, presyncope, syncope, snoring, daytime somnolence, bleeding, or neurologic sequela. The patient is tolerating medications without difficulties and is otherwise without complaint today.    Atrial Fibrillation Risk Factors:  he does not have symptoms or diagnosis of sleep apnea. he does not have a history of rheumatic fever.   he has a BMI of Body mass index is 23.73 kg/m.Marland Kitchen Filed Weights   02/29/20 1445  Weight: 75 kg    Family History  Problem Relation Age of Onset  . Heart disease Mother   . Heart attack Mother   . Heart disease Father   . Heart attack Father   . Heart disease Brother   . Heart attack Brother      Atrial Fibrillation Management history:  Previous antiarrhythmic drugs: none Previous cardioversions: none Previous ablations: none CHADS2VASC score: 5 Anticoagulation history: none   Past Medical History:  Diagnosis Date  . Abnormality of gait 02/07/2015  . AICD (automatic cardioverter/defibrillator) present    [employer]  . CAD (coronary artery disease)    Hx of Inf MI treated with CABG in 1999 // Myoview  11/16: mild inf-lat ischemia, EF 34, high risk // LHC 3/19: 3/3 bypass grafts patent  . Carotid artery disease ([place])    Followed by [facility] (Dr. Trula Slade) // [place] in 2/19: bilat ICA 40-59  . Chronic systolic CHF (congestive heart failure) ([place]) 01/01/2010   Echo 12/30/2017: Mild LVH, EF 30-35, diffuse HK, grade 1 diastolic dysfunction, mildly reduced RVSF // Echo 04/12/2016:  EF 35-40, PASP 39, mild to moderate TR, mild LAE // Echo (12/14):  Base/mid inferior and inferolateral HK, EF 45%, mild LAE, mildly reduced RVSF  . CKD (chronic kidney disease)   . Depression   . HLP (hyperkeratosis lenticularis perstans)   . HTN (hypertension)   . ICD (implantable cardiac defibrillator)  BSx    [employer]  . Ischemic cardiomyopathy   . Myocardial infarction ([place]) 1999  . Near syncope 09/21/2014  . Orthostatic hypotension   . PAF (paroxysmal atrial fibrillation) ([place])   . Presence of permanent cardiac pacemaker   . Wears partial dentures    upper   Past Surgical History:  Procedure Laterality Date  . CARDIAC CATHETERIZATION N/A 12/11/2015   Procedure: Left Heart Cath and Coronary Angiography;  Surgeon: Charolette Forward, MD;  Location: [facility];  Service: Cardiovascular;  Laterality: N/A;  . CARDIAC DEFIBRILLATOR PLACEMENT     Chemical engineer  . CARDIAC DEFIBRILLATOR PLACEMENT  04/08/2016   NOT MRI SAFE (DOCUMENT SCANNED IN SYSTEM  . COLONOSCOPY  10/06/2007   Mild sigmoid diverticulosis. Small internal hemorrhoids.   . CORONARY ARTERY BYPASS GRAFT    . ELBOW SURGERY Left  plastic bone replacement  . HERNIA REPAIR     x2  . LEFT HEART CATH AND CORS/GRAFTS ANGIOGRAPHY N/A 01/07/2018   Procedure: LEFT HEART CATH AND CORS/GRAFTS ANGIOGRAPHY;  Surgeon: Burnell Blanks, MD;  Location: [facility];  Service: Cardiovascular;  Laterality: N/A;  . PERMANENT PACEMAKER GENERATOR CHANGE N/A 11/25/2013   Procedure: PERMANENT PACEMAKER GENERATOR CHANGE;  Surgeon: Deboraha Sprang, MD;   Location: [facility];  Service: Cardiovascular;  Laterality: N/A;   . ROTATOR CUFF REPAIR Right 2006  . SINUS EXPLORATION  1987  . ULNAR NERVE TRANSPOSITION Left 07/19/2019   Procedure: Ulnar nerve release at the elbow with anterior transposition and flexor pronator lengthening, anterior interosseous nerve transfer to the deep motor branch of the ulnar nerve at the forearm, flexor digitorum profundus tendon transfer of the ring and small to middle fingers, ulnar nerve release at the wrist, Guyons canal;  Surgeon: Roseanne Kaufman, MD;  Location: [facility];  Service: Orthoped  . ULTRASOUND GUIDANCE FOR VASCULAR ACCESS  01/07/2018   Procedure: Ultrasound Guidance For Vascular Access;  Surgeon: Burnell Blanks, MD;  Location: [facility];  Service: Cardiovascular;;    Current Outpatient Medications  Medication Sig Dispense Refill  . amLODipine (NORVASC) 5 MG tablet TAKE 1 TABLET (5 MG TOTAL) BY MOUTH DAILY. 90 tablet 3  . buPROPion (WELLBUTRIN XL) 300 MG 24 hr tablet Take 300 mg by mouth at bedtime.    . carvedilol (COREG) 6.25 MG tablet Take 1 tablet (6.25 mg total) by mouth 2 (two) times daily with a meal. 180 tablet 3  . clopidogrel (PLAVIX) 75 MG tablet Take 1 tablet (75 mg total) by mouth at bedtime. 90 tablet 3  . colchicine 0.6 MG tablet Take 0.6 mg by mouth daily.     . cyclobenzaprine (FLEXERIL) 5 MG tablet Take 5 mg by mouth 3 (three) times daily as needed for muscle spasms.    Marland Kitchen escitalopram (LEXAPRO) 10 MG tablet Take 10 mg by mouth 2 (two) times daily.     . famotidine (PEPCID) 20 MG tablet Take 20 mg by mouth 2 (two) times daily.    Marland Kitchen gabapentin (NEURONTIN) 300 MG capsule Take 600 mg by mouth at bedtime.     . nitroGLYCERIN (NITROSTAT) 0.4 MG SL tablet Place 1 tablet (0.4 mg total) under the tongue every 5 (five) minutes as needed for chest pain. 25 tablet 5  . oxyCODONE-acetaminophen (PERCOCET) 10-325 MG tablet Take 1 tablet by mouth every 8 (eight) hours as needed for pain.      Marland Kitchen oxyCODONE-acetaminophen (PERCOCET) 10-325 MG tablet Take 1 tablet by mouth every 6 (six) hours as needed for pain. 40 tablet 0  . PREVIDENT 5000 BOOSTER PLUS 1.1 % PSTE See admin instructions.    . rosuvastatin (CRESTOR) 40 MG tablet Take 1 tablet (40 mg total) by mouth daily. 90 tablet 2  . sacubitril-valsartan (ENTRESTO) 97-103 MG Take 1 tablet by mouth 2 (two) times daily. 180 tablet 3  . spironolactone (ALDACTONE) 25 MG tablet Take 25 mg by mouth at bedtime.     . traZODone (DESYREL) 50 MG tablet TAKE 1 TABLET AT BEDTIME ONCE A DAY AS NEEDED FOR TROUBLE SLEEPING    . apixaban (ELIQUIS) 5 MG TABS tablet Take 1 tablet (5 mg total) by mouth 2 (two) times daily. 60 tablet 3   No current facility-administered medications for this encounter.    Allergies  Allergen Reactions  . Tramadol Other (See Comments)  Unknown     Social History   Socioeconomic History  . Marital status: Married    Spouse name: Not on file  . Number of children: 1  . Years of education: HS +  . Highest education level: Not on file  Occupational History  . Occupation: retired  Tobacco Use  . Smoking status: Former Smoker    Types: Pipe    Quit date: 09/19/1999    Years since quitting: 20.4  . Smokeless tobacco: Never Used  . Tobacco comment: quit in 1998  Substance and Sexual Activity  . Alcohol use: No    Alcohol/week: 0.0 standard drinks    Comment: recovering alcoholic, quit 0000000 ago (2000)  . Drug use: No  . Sexual activity: Not on file  Other Topics Concern  . Not on file  Social History Narrative   Lives at home w/ his wife.   Patient is right handed.   Patient drinks about 3 cups of soda daily.   Social Determinants of Health   Financial Resource Strain:   . Difficulty of Paying Living Expenses:   Food Insecurity:   . Worried About Charity fundraiser in the Last Year:   . Arboriculturist in the Last Year:   Transportation Needs:   . Film/video editor (Medical):   Marland Kitchen Lack  of Transportation (Non-Medical):   Physical Activity:   . Days of Exercise per Week:   . Minutes of Exercise per Session:   Stress:   . Feeling of Stress :   Social Connections:   . Frequency of Communication with Friends and Family:   . Frequency of Social Gatherings with Friends and Family:   . Attends Religious Services:   . Active Member of Clubs or Organizations:   . Attends Archivist Meetings:   Marland Kitchen Marital Status:   Intimate Partner Violence:   . Fear of Current or Ex-Partner:   . Emotionally Abused:   Marland Kitchen Physically Abused:   . Sexually Abused:      ROS- All systems are reviewed and negative except as per the HPI above.  Physical Exam: Vitals:   02/29/20 1445  BP: 110/60  Pulse: 60  Weight: 75 kg  Height: 5\' 10"  (1.778 m)    GEN- The patient is well appearing elderly male, alert and oriented x 3 today.   Head- normocephalic, atraumatic Eyes-  Sclera clear, conjunctiva pink Ears- hearing intact Oropharynx- clear Neck- supple  Lungs- Clear to ausculation bilaterally, normal work of breathing Heart- Regular rate and rhythm, no murmurs, rubs or gallops  GI- soft, NT, ND, + BS Extremities- no clubbing, cyanosis, or edema MS- no significant deformity or atrophy Skin- no rash or lesion Psych- euthymic mood, full affect Neuro- strength and sensation are intact  Wt Readings from Last 3 Encounters:  02/29/20 75 kg  07/19/19 77.1 kg  07/08/19 77.7 kg    EKG today demonstrates A paced rhythm HR 60, NST, PR 214, QRS 94, QTc 402  Echo 12/30/17 demonstrated  - Left ventricle: The cavity size was normal. Wall thickness was  increased in a pattern of mild LVH. Systolic function was  moderately to severely reduced. The estimated ejection fraction  was in the range of 30% to 35%. Diffuse hypokinesis. Doppler  parameters are consistent with abnormal left ventricular  relaxation (grade 1 diastolic dysfunction).  - Right ventricle: Systolic function was  mildly reduced.   Epic records are reviewed at length today  CHA2DS2-VASc Score = 5  The patient's score is based upon: CHF History: Yes HTN History: Yes Age : 72 + Diabetes History: No Stroke History: No Vascular Disease History: Yes Gender: Male      ASSESSMENT AND PLAN: 1. Paroxysmal Atrial Fibrillation (ICD10:  I48.0) The patient's CHA2DS2-VASc score is 5, indicating a 7.2% annual risk of stroke.   General education about afib provided and questions answered. We also discussed his stroke risk and the risks and benefits of anticoagulation. Will plan to start Eliquis 5 mg BID and stop ASA. Will continue Plavix given CAD and carotid disease. Check bmet/CBC today.  2. Secondary Hypercoagulable State (ICD10:  D68.69) The patient is at significant risk for stroke/thromboembolism based upon his CHA2DS2-VASc Score of 5.  Continue Apixaban (Eliquis).   3. HTN Stable, no changes today.  4. CAD S/p CABG  No anginal symptoms.  5. Chronic systolic CHF Ischemic CM. No signs or symptoms of fluid overload.   Follow up with Dr Caryl Comes per recall.    [facility] [address] [place], [place] [address] [phone] 02/29/2020 3:45 PM

## 2020-03-21 ENCOUNTER — Telehealth: Payer: Self-pay | Admitting: Physician Assistant

## 2020-03-21 NOTE — Telephone Encounter (Signed)
[facility] pharmacy called to verify if patient was on Eliquis.

## 2020-03-27 ENCOUNTER — Other Ambulatory Visit: Payer: Self-pay | Admitting: Internal Medicine

## 2020-03-27 ENCOUNTER — Other Ambulatory Visit: Payer: Self-pay | Admitting: Physician Assistant

## 2020-04-03 DIAGNOSIS — K1121 Acute sialoadenitis: Secondary | ICD-10-CM | POA: Diagnosis not present

## 2020-04-06 ENCOUNTER — Ambulatory Visit (INDEPENDENT_AMBULATORY_CARE_PROVIDER_SITE_OTHER): Payer: Medicare Other | Admitting: *Deleted

## 2020-04-06 DIAGNOSIS — I255 Ischemic cardiomyopathy: Secondary | ICD-10-CM | POA: Diagnosis not present

## 2020-04-06 LAB — CUP PACEART REMOTE DEVICE CHECK
Battery Remaining Longevity: 66 mo
Battery Remaining Percentage: 75 %
Brady Statistic RA Percent Paced: 65 %
Brady Statistic RV Percent Paced: 1 %
Date Time Interrogation Session: 20210604004100
HighPow Impedance: 48 Ohm
Implantable Lead Implant Date: 20060512
Implantable Lead Implant Date: 20060512
Implantable Lead Location: 753859
Implantable Lead Location: 753860
Implantable Lead Model: 158
Implantable Lead Model: 5076
Implantable Lead Serial Number: 159477
Implantable Pulse Generator Implant Date: 20150123
Lead Channel Impedance Value: 408 Ohm
Lead Channel Impedance Value: 578 Ohm
Lead Channel Pacing Threshold Amplitude: 0.6 V
Lead Channel Pacing Threshold Amplitude: 0.8 V
Lead Channel Pacing Threshold Pulse Width: 0.4 ms
Lead Channel Pacing Threshold Pulse Width: 0.4 ms
Lead Channel Setting Pacing Amplitude: 2 V
Lead Channel Setting Pacing Amplitude: 2.4 V
Lead Channel Setting Pacing Pulse Width: 0.4 ms
Lead Channel Setting Sensing Sensitivity: 0.6 mV
Pulse Gen Serial Number: 112776

## 2020-04-10 NOTE — Progress Notes (Signed)
Remote ICD transmission.   

## 2020-05-18 ENCOUNTER — Encounter: Payer: Self-pay | Admitting: Internal Medicine

## 2020-05-18 ENCOUNTER — Ambulatory Visit (INDEPENDENT_AMBULATORY_CARE_PROVIDER_SITE_OTHER): Payer: Medicare Other | Admitting: Internal Medicine

## 2020-05-18 ENCOUNTER — Other Ambulatory Visit: Payer: Self-pay

## 2020-05-18 DIAGNOSIS — I5022 Chronic systolic (congestive) heart failure: Secondary | ICD-10-CM | POA: Diagnosis not present

## 2020-05-18 DIAGNOSIS — I255 Ischemic cardiomyopathy: Secondary | ICD-10-CM

## 2020-05-18 LAB — CUP PACEART INCLINIC DEVICE CHECK
Date Time Interrogation Session: 20210716160202
HighPow Impedance: 49 Ohm
Implantable Lead Implant Date: 20060512
Implantable Lead Implant Date: 20060512
Implantable Lead Location: 753859
Implantable Lead Location: 753860
Implantable Lead Model: 158
Implantable Lead Model: 5076
Implantable Lead Serial Number: 159477
Implantable Pulse Generator Implant Date: 20150123
Lead Channel Impedance Value: 407 Ohm
Lead Channel Impedance Value: 641 Ohm
Lead Channel Pacing Threshold Amplitude: 0.6 V
Lead Channel Pacing Threshold Amplitude: 0.9 V
Lead Channel Pacing Threshold Pulse Width: 0.4 ms
Lead Channel Pacing Threshold Pulse Width: 0.4 ms
Lead Channel Sensing Intrinsic Amplitude: 2.5 mV
Lead Channel Sensing Intrinsic Amplitude: 8 mV
Lead Channel Setting Pacing Amplitude: 2 V
Lead Channel Setting Pacing Amplitude: 2.4 V
Lead Channel Setting Pacing Pulse Width: 0.4 ms
Lead Channel Setting Sensing Sensitivity: 0.6 mV
Pulse Gen Serial Number: 112776

## 2020-05-18 NOTE — Patient Instructions (Addendum)
Medication Instructions:  Your physician has recommended you make the following change in your medication:   ** Stop taking your Amlodipine   Labwork: None ordered.  Testing/Procedures: None ordered.  Follow-Up: 08/29/2020 at 215pm Your physician wants you to follow-up in: 3 months with Dr Caryl Comes. You will receive a reminder letter in the mail two months in advance. If you don't receive a letter, please call our office to schedule the follow-up appointment.  Remote monitoring is used to monitor your Pacemaker of ICD from home. This monitoring reduces the number of office visits required to check your device to one time per year. It allows [place] to keep an eye on the functioning of your device to ensure it is working properly.   Any Other Special Instructions Will Be Listed Below (If Applicable).  If you need a refill on your cardiac medications before your next appointment, please call your pharmacy.

## 2020-05-18 NOTE — Progress Notes (Signed)
Patient Care Team: Ernestene Kiel, MD as PCP - General (Internal Medicine) Deboraha Sprang, MD as PCP - Cardiology (Cardiology) Deboraha Sprang, MD as Consulting Physician (Cardiology) Serafina Mitchell, MD as Consulting Physician (Vascular Surgery) Kathrynn Ducking, MD as Consulting Physician (Neurology)   HPI  Connor Wilkerson is a 79 y.o. male Seen following ICD generator replacement initally implanted 2006 for primary prevention in setting of ischemic cardiomyopathy.   subsequently  the device pocket moved to subpectorally.  He is now complaining of device tenderness over the last 6 months.  It is somewhat better over the last 3 months.  He is noted migration into his axilla.  Denies trauma.  No cellulitis of which he is aware.  He has hx of remote MI 1999, CABG  1999   no exertional chest discomfort.  Mild dyspnea.   Lightheadedness with standing almost all the time.  Frequently has to sit down.   DATE TEST EF   11/16 Myoview 34%   2/17 Cath  LM 30%; Native T; LIMA patent; SVG-PDA 70-80% anastomosis SVG-OM Patent  2/19 Echo 30-35%   3/19` LHC  LIMA p, SVG-dRCAp, SVG-OM2p,  Native T       Date Cr K Hgb  4/21 1.7 (GFR 40) 4.4 12.7                Past Medical History     Past Medical History:  Diagnosis Date  . Abnormality of gait 02/07/2015  . AICD (automatic cardioverter/defibrillator) present    [employer]  . CAD (coronary artery disease)    Hx of Inf MI treated with CABG in 1999 // Myoview 11/16: mild inf-lat ischemia, EF 34, high risk // LHC 3/19: 3/3 bypass grafts patent  . Carotid artery disease ([place])    Followed by [facility] (Dr. Trula Slade) // [place] in 2/19: bilat ICA 40-59  . Chronic systolic CHF (congestive heart failure) ([place]) 01/01/2010   Echo 12/30/2017: Mild LVH, EF 30-35, diffuse HK, grade 1 diastolic dysfunction, mildly reduced RVSF // Echo 04/12/2016:  EF 35-40, PASP 39, mild to moderate TR, mild LAE // Echo (12/14):   Base/mid inferior and inferolateral HK, EF 45%, mild LAE, mildly reduced RVSF  . CKD (chronic kidney disease)   . Depression   . HLP (hyperkeratosis lenticularis perstans)   . HTN (hypertension)   . ICD (implantable cardiac defibrillator)  BSx    [employer]  . Ischemic cardiomyopathy   . Myocardial infarction ([place]) 1999  . Near syncope 09/21/2014  . Orthostatic hypotension   . PAF (paroxysmal atrial fibrillation) ([place])   . Presence of permanent cardiac pacemaker   . Wears partial dentures    upper    Past Surgical History:  Procedure Laterality Date  . CARDIAC CATHETERIZATION N/A 12/11/2015   Procedure: Left Heart Cath and Coronary Angiography;  Surgeon: Charolette Forward, MD;  Location: [facility];  Service: Cardiovascular;  Laterality: N/A;  . CARDIAC DEFIBRILLATOR PLACEMENT     Chemical engineer  . CARDIAC DEFIBRILLATOR PLACEMENT  04/08/2016   NOT MRI SAFE (DOCUMENT SCANNED IN SYSTEM  . COLONOSCOPY  10/06/2007   Mild sigmoid diverticulosis. Small internal hemorrhoids.   . CORONARY ARTERY BYPASS GRAFT    . ELBOW SURGERY Left    plastic bone replacement  . HERNIA REPAIR     x2  . LEFT HEART CATH AND CORS/GRAFTS ANGIOGRAPHY N/A 01/07/2018   Procedure: LEFT HEART CATH AND CORS/GRAFTS ANGIOGRAPHY;  Surgeon: Angelena Form,  Annita Brod, MD;  Location: [facility];  Service: Cardiovascular;  Laterality: N/A;  . PERMANENT PACEMAKER GENERATOR CHANGE N/A 11/25/2013   Procedure: PERMANENT PACEMAKER GENERATOR CHANGE;  Surgeon: Deboraha Sprang, MD;  Location[facility];  Service: Cardiovascular;  Laterality: N/A;   . ROTATOR CUFF REPAIR Right 2006  . SINUS EXPLORATION  1987  . ULNAR NERVE TRANSPOSITION Left 07/19/2019   Procedure: Ulnar nerve release at the elbow with anterior transposition and flexor pronator lengthening, anterior interosseous nerve transfer to the deep motor branch of the ulnar nerve at the forearm, flexor digitorum profundus tendon transfer of the ring and  small to middle fingers, ulnar nerve release at the wrist, Guyons canal;  Surgeon: Roseanne Kaufman, MD;  Location: [place] [facility];  Service: Orthoped  . ULTRASOUND GUIDANCE FOR VASCULAR ACCESS  01/07/2018   Procedure: Ultrasound Guidance For Vascular Access;  Surgeon: Burnell Blanks, MD;  Location: [facility];  Service: Cardiovascular;;    Current Outpatient Medications  Medication Sig Dispense Refill  . amLODipine (NORVASC) 5 MG tablet TAKE 1 TABLET BY MOUTH EVERY DAY 90 tablet 3  . apixaban (ELIQUIS) 5 MG TABS tablet Take 1 tablet (5 mg total) by mouth 2 (two) times daily. 60 tablet 3  . buPROPion (WELLBUTRIN XL) 300 MG 24 hr tablet Take 300 mg by mouth at bedtime.    . carvedilol (COREG) 6.25 MG tablet Take 1 tablet (6.25 mg total) by mouth 2 (two) times daily with a meal. 180 tablet 3  . clopidogrel (PLAVIX) 75 MG tablet Take 1 tablet (75 mg total) by mouth at bedtime. 90 tablet 3  . colchicine 0.6 MG tablet Take 0.6 mg by mouth daily.     . cyclobenzaprine (FLEXERIL) 5 MG tablet Take 5 mg by mouth 3 (three) times daily as needed for muscle spasms.    Marland Kitchen escitalopram (LEXAPRO) 10 MG tablet Take 10 mg by mouth 2 (two) times daily.     . famotidine (PEPCID) 20 MG tablet Take 20 mg by mouth 2 (two) times daily.    Marland Kitchen gabapentin (NEURONTIN) 300 MG capsule Take 600 mg by mouth at bedtime.     . nitroGLYCERIN (NITROSTAT) 0.4 MG SL tablet Place 1 tablet (0.4 mg total) under the tongue every 5 (five) minutes as needed for chest pain. 25 tablet 5  . oxyCODONE-acetaminophen (PERCOCET) 10-325 MG tablet Take 1 tablet by mouth every 6 (six) hours as needed for pain. 40 tablet 0  . PREVIDENT 5000 BOOSTER PLUS 1.1 % PSTE See admin instructions.    . rosuvastatin (CRESTOR) 40 MG tablet Take 1 tablet (40 mg total) by mouth daily. 90 tablet 2  . sacubitril-valsartan (ENTRESTO) 97-103 MG Take 1 tablet by mouth 2 (two) times daily. 180 tablet 3  . spironolactone (ALDACTONE) 25 MG tablet TAKE 1 TABLET (25  MG TOTAL) BY MOUTH DAILY AT NIGHT ONLY 90 tablet 0  . traZODone (DESYREL) 50 MG tablet TAKE 1 TABLET AT BEDTIME ONCE A DAY AS NEEDED FOR TROUBLE SLEEPING     No current facility-administered medications for this visit.    Allergies  Allergen Reactions  . Tramadol Other (See Comments)    Unknown     Review of Systems negative except from HPI and PMH  Physical Exam BP 118/62   Pulse (!) 59   Ht 5\' 10"  (1.778 m)   Wt 170 lb (77.1 kg)   SpO2 98%   BMI 24.39 kg/m  Well developed and well nourished in  no acute distress HENT normal Neck supple   Clear Device pocket tender without erythema.  Device has migrated into the anterior axillary line  Regular rate and rhythm, no murmur Abd-soft with active BS No Clubbing cyanosis  edema Skin-warm and dry A & Oriented  Grossly normal sensory and motor function  ECG:   Assessment and  Plan\  Ischemic cardiomyopathy  Implantable defibrillator The patient's device was interrogated.  The information was reviewed. No changes were made in the programming.       Hypertension-orthostatic hypotension-new  PVC  Device pocket pain-new  Renal insufficiency grade 3  Congestive heart failure-chronic-systolic  Euvolemic continue current meds  Euvolemic continue current meds  PVC burden reduced  Renal insuff at this juncture does not require drug titration.  Orthostatic lightheadedness.  We will discontinue his amlodipine  Device pocket discomfort is concerning for infection.  It may be related to migration which would be unlikely from the subpectoral space but I guess possible.  The discomfort has improved over the last few months and so we will hope that it will continue to improve.  I discussed with him that infection will declare itself for right now we will continue to follow.

## 2020-05-22 ENCOUNTER — Other Ambulatory Visit: Payer: Self-pay | Admitting: Internal Medicine

## 2020-06-21 DIAGNOSIS — K2270 Barrett's esophagus without dysplasia: Secondary | ICD-10-CM | POA: Diagnosis not present

## 2020-06-24 ENCOUNTER — Other Ambulatory Visit: Payer: Self-pay | Admitting: Physician Assistant

## 2020-07-06 ENCOUNTER — Ambulatory Visit (INDEPENDENT_AMBULATORY_CARE_PROVIDER_SITE_OTHER): Payer: Medicare Other | Admitting: *Deleted

## 2020-07-06 DIAGNOSIS — I255 Ischemic cardiomyopathy: Secondary | ICD-10-CM | POA: Diagnosis not present

## 2020-07-07 LAB — CUP PACEART REMOTE DEVICE CHECK
Battery Remaining Longevity: 60 mo
Battery Remaining Percentage: 70 %
Brady Statistic RA Percent Paced: 71 %
Brady Statistic RV Percent Paced: 9 %
Date Time Interrogation Session: 20210903004100
HighPow Impedance: 46 Ohm
Implantable Lead Implant Date: 20060512
Implantable Lead Implant Date: 20060512
Implantable Lead Location: 753859
Implantable Lead Location: 753860
Implantable Lead Model: 158
Implantable Lead Model: 5076
Implantable Lead Serial Number: 159477
Implantable Pulse Generator Implant Date: 20150123
Lead Channel Impedance Value: 389 Ohm
Lead Channel Impedance Value: 533 Ohm
Lead Channel Pacing Threshold Amplitude: 0.6 V
Lead Channel Pacing Threshold Amplitude: 0.9 V
Lead Channel Pacing Threshold Pulse Width: 0.4 ms
Lead Channel Pacing Threshold Pulse Width: 0.4 ms
Lead Channel Setting Pacing Amplitude: 2 V
Lead Channel Setting Pacing Amplitude: 2.4 V
Lead Channel Setting Pacing Pulse Width: 0.4 ms
Lead Channel Setting Sensing Sensitivity: 0.6 mV
Pulse Gen Serial Number: 112776

## 2020-07-10 NOTE — Progress Notes (Signed)
Remote ICD transmission.   

## 2020-07-13 ENCOUNTER — Telehealth: Payer: Self-pay | Admitting: Internal Medicine

## 2020-07-13 NOTE — Telephone Encounter (Signed)
Spoke with pt's wife who states pt went for 3rd Covid vaccine and was told he is not eligible at this time.  Pt's wife advised vaccine sites must adhere to current government regulations for the booster dose which includes pt's who are immunocompromised.  Recommended pt contact his PCP re: Covid booster vaccine.  Pt's wife verbalizes understanding and agrees with current plan.

## 2020-07-13 NOTE — Telephone Encounter (Signed)
Patient's wife states that [facility] told her and her husband that they do not qualify to get the booster shot. She was advised to call her husbands cardiologist and see what they recommend doing. She is very worried that if her husband gets covid he may not make it.

## 2020-08-24 DIAGNOSIS — R7301 Impaired fasting glucose: Secondary | ICD-10-CM | POA: Diagnosis not present

## 2020-08-24 DIAGNOSIS — Z79899 Other long term (current) drug therapy: Secondary | ICD-10-CM | POA: Diagnosis not present

## 2020-08-24 DIAGNOSIS — I739 Peripheral vascular disease, unspecified: Secondary | ICD-10-CM | POA: Diagnosis not present

## 2020-08-24 DIAGNOSIS — I10 Essential (primary) hypertension: Secondary | ICD-10-CM | POA: Diagnosis not present

## 2020-08-24 DIAGNOSIS — I509 Heart failure, unspecified: Secondary | ICD-10-CM | POA: Diagnosis not present

## 2020-08-24 DIAGNOSIS — M25511 Pain in right shoulder: Secondary | ICD-10-CM | POA: Diagnosis not present

## 2020-08-24 DIAGNOSIS — E785 Hyperlipidemia, unspecified: Secondary | ICD-10-CM | POA: Diagnosis not present

## 2020-08-27 DIAGNOSIS — I951 Orthostatic hypotension: Secondary | ICD-10-CM | POA: Insufficient documentation

## 2020-08-29 ENCOUNTER — Ambulatory Visit (INDEPENDENT_AMBULATORY_CARE_PROVIDER_SITE_OTHER): Payer: Medicare Other | Admitting: Internal Medicine

## 2020-08-29 ENCOUNTER — Other Ambulatory Visit: Payer: Self-pay

## 2020-08-29 VITALS — BP 150/88 | HR 61 | Wt 160.0 lb

## 2020-08-29 DIAGNOSIS — Z95810 Presence of automatic (implantable) cardiac defibrillator: Secondary | ICD-10-CM | POA: Diagnosis not present

## 2020-08-29 DIAGNOSIS — I951 Orthostatic hypotension: Secondary | ICD-10-CM

## 2020-08-29 DIAGNOSIS — I5022 Chronic systolic (congestive) heart failure: Secondary | ICD-10-CM | POA: Diagnosis not present

## 2020-08-29 DIAGNOSIS — I480 Paroxysmal atrial fibrillation: Secondary | ICD-10-CM | POA: Diagnosis not present

## 2020-08-29 DIAGNOSIS — I255 Ischemic cardiomyopathy: Secondary | ICD-10-CM | POA: Diagnosis not present

## 2020-08-29 LAB — CUP PACEART INCLINIC DEVICE CHECK
Date Time Interrogation Session: 20211027151021
HighPow Impedance: 49 Ohm
Implantable Lead Implant Date: 20060512
Implantable Lead Implant Date: 20060512
Implantable Lead Location: 753859
Implantable Lead Location: 753860
Implantable Lead Model: 158
Implantable Lead Model: 5076
Implantable Lead Serial Number: 159477
Implantable Pulse Generator Implant Date: 20150123
Lead Channel Impedance Value: 400 Ohm
Lead Channel Impedance Value: 646 Ohm
Lead Channel Pacing Threshold Amplitude: 0.5 V
Lead Channel Pacing Threshold Amplitude: 0.8 V
Lead Channel Pacing Threshold Pulse Width: 0.4 ms
Lead Channel Pacing Threshold Pulse Width: 0.4 ms
Lead Channel Sensing Intrinsic Amplitude: 2 mV
Lead Channel Sensing Intrinsic Amplitude: 8.8 mV
Lead Channel Setting Pacing Amplitude: 2 V
Lead Channel Setting Pacing Amplitude: 2.4 V
Lead Channel Setting Pacing Pulse Width: 0.4 ms
Lead Channel Setting Sensing Sensitivity: 0.6 mV
Pulse Gen Serial Number: 112776

## 2020-08-29 NOTE — Progress Notes (Signed)
Patient Care Team: Ernestene Kiel, MD as PCP - General (Internal Medicine) Deboraha Sprang, MD as PCP - Cardiology (Cardiology) Deboraha Sprang, MD as Consulting Physician (Cardiology) Serafina Mitchell, MD as Consulting Physician (Vascular Surgery) Kathrynn Ducking, MD as Consulting Physician (Neurology)   HPI  Connor Wilkerson is a 79 y.o. male Seen following ICD generator replacement initally implanted 2006 for primary prevention in setting of ischemic cardiomyopathy.   subsequently  the device pocket moved to subpectorally.  He is now complaining of device tenderness over the last 6 months.  It is somewhat better over the last 3 months.  He is noted migration into his axilla.  Denies trauma.  No cellulitis of which he is aware.  He has hx of remote MI 1999, CABG  1999   no exertional chest discomfort.  Mild dyspnea.   lightheadness is better-- but still with syncope every 3-6 weeks; mostly without warning.  Always while standing  The patient denies chest pain, shortness of breath, nocturnal dyspnea, orthopnea or peripheral edema.  There have been no palpitations.    DATE TEST EF   11/16 Myoview 34%   2/17 Cath  LM 30%; Native T; LIMA patent; SVG-PDA 70-80% anastomosis SVG-OM Patent  2/19 Echo 30-35%   3/19` LHC  LIMA p, SVG-dRCAp, SVG-OM2p,  Native T       Date Cr K Hgb  4/21 1.7 (GFR 40) 4.4 12.7                Past Medical History     Past Medical History:  Diagnosis Date  . Abnormality of gait 02/07/2015  . AICD (automatic cardioverter/defibrillator) present    [employer]  . CAD (coronary artery disease)    Hx of Inf MI treated with CABG in 1999 // Myoview 11/16: mild inf-lat ischemia, EF 34, high risk // LHC 3/19: 3/3 bypass grafts patent  . Carotid artery disease ([place])    Followed by [facility] (Dr. Trula Slade) // [place] in 2/19: bilat ICA 40-59  . Chronic systolic CHF (congestive heart failure) ([place]) 01/01/2010   Echo 12/30/2017:  Mild LVH, EF 30-35, diffuse HK, grade 1 diastolic dysfunction, mildly reduced RVSF // Echo 04/12/2016:  EF 35-40, PASP 39, mild to moderate TR, mild LAE // Echo (12/14):  Base/mid inferior and inferolateral HK, EF 45%, mild LAE, mildly reduced RVSF  . CKD (chronic kidney disease)   . Depression   . HLP (hyperkeratosis lenticularis perstans)   . HTN (hypertension)   . ICD (implantable cardiac defibrillator)  BSx    [employer]  . Ischemic cardiomyopathy   . Myocardial infarction ([place]) 1999  . Near syncope 09/21/2014  . Orthostatic hypotension   . PAF (paroxysmal atrial fibrillation) ([place])   . Presence of permanent cardiac pacemaker   . Wears partial dentures    upper    Past Surgical History:  Procedure Laterality Date  . CARDIAC CATHETERIZATION N/A 12/11/2015   Procedure: Left Heart Cath and Coronary Angiography;  Surgeon: Charolette Forward, MD;  Location: [facility];  Service: Cardiovascular;  Laterality: N/A;  . CARDIAC DEFIBRILLATOR PLACEMENT     Chemical engineer  . CARDIAC DEFIBRILLATOR PLACEMENT  04/08/2016   NOT MRI SAFE (DOCUMENT SCANNED IN SYSTEM  . COLONOSCOPY  10/06/2007   Mild sigmoid diverticulosis. Small internal hemorrhoids.   . CORONARY ARTERY BYPASS GRAFT    . ELBOW SURGERY Left    plastic bone replacement  . HERNIA REPAIR  x2  . LEFT HEART CATH AND CORS/GRAFTS ANGIOGRAPHY N/A 01/07/2018   Procedure: LEFT HEART CATH AND CORS/GRAFTS ANGIOGRAPHY;  Surgeon: Burnell Blanks, MD;  Location: [facility];  Service: Cardiovascular;  Laterality: N/A;  . PERMANENT PACEMAKER GENERATOR CHANGE N/A 11/25/2013   Procedure: PERMANENT PACEMAKER GENERATOR CHANGE;  Surgeon: Deboraha Sprang, MD;  Location: [facility] LAB;  Service: Cardiovascular;  Laterality: N/A;   . ROTATOR CUFF REPAIR Right 2006  . SINUS EXPLORATION  1987  . ULNAR NERVE TRANSPOSITION Left 07/19/2019   Procedure: Ulnar nerve release at the elbow with anterior transposition and flexor pronator  lengthening, anterior interosseous nerve transfer to the deep motor branch of the ulnar nerve at the forearm, flexor digitorum profundus tendon transfer of the ring and small to middle fingers, ulnar nerve release at the wrist, Guyons canal;  Surgeon: Roseanne Kaufman, MD;  Location: [facility];  Service: Orthoped  . ULTRASOUND GUIDANCE FOR VASCULAR ACCESS  01/07/2018   Procedure: Ultrasound Guidance For Vascular Access;  Surgeon: Burnell Blanks, MD;  Location: [facility];  Service: Cardiovascular;;    Current Outpatient Medications  Medication Sig Dispense Refill  . apixaban (ELIQUIS) 5 MG TABS tablet Take 1 tablet (5 mg total) by mouth 2 (two) times daily. 60 tablet 3  . buPROPion (WELLBUTRIN XL) 300 MG 24 hr tablet Take 300 mg by mouth at bedtime.    . carvedilol (COREG) 6.25 MG tablet Take 1 tablet (6.25 mg total) by mouth 2 (two) times daily with a meal. 180 tablet 3  . clopidogrel (PLAVIX) 75 MG tablet Take 1 tablet (75 mg total) by mouth at bedtime. 90 tablet 3  . colchicine 0.6 MG tablet Take 0.6 mg by mouth daily.     . cyclobenzaprine (FLEXERIL) 5 MG tablet Take 5 mg by mouth 3 (three) times daily as needed for muscle spasms.    Marland Kitchen escitalopram (LEXAPRO) 10 MG tablet Take 10 mg by mouth 2 (two) times daily.     . famotidine (PEPCID) 20 MG tablet Take 20 mg by mouth 2 (two) times daily.    Marland Kitchen gabapentin (NEURONTIN) 300 MG capsule Take 600 mg by mouth at bedtime.     . nitroGLYCERIN (NITROSTAT) 0.4 MG SL tablet Place 1 tablet (0.4 mg total) under the tongue every 5 (five) minutes as needed for chest pain. 25 tablet 5  . PREVIDENT 5000 BOOSTER PLUS 1.1 % PSTE See admin instructions.    . rosuvastatin (CRESTOR) 40 MG tablet TAKE 1 TABLET BY MOUTH EVERY DAY 90 tablet 2  . spironolactone (ALDACTONE) 25 MG tablet TAKE 1 TABLET (25 MG TOTAL) BY MOUTH DAILY AT NIGHT ONLY 90 tablet 3  . traZODone (DESYREL) 50 MG tablet TAKE 1 TABLET AT BEDTIME ONCE A DAY AS NEEDED FOR TROUBLE SLEEPING      . triamcinolone cream (KENALOG) 0.1 % Apply 1 application topically as needed for rash.     No current facility-administered medications for this visit.    Allergies  Allergen Reactions  . Tramadol Other (See Comments)    Unknown     Review of Systems negative except from HPI and PMH  Physical Exam BP (!) 150/88   Pulse 61   Wt 160 lb (72.6 kg)   SpO2 94%   BMI 22.96 kg/m  Well developed and well nourished in no acute distress HENT normal Neck supple   Clear Device pocket tender without erythema.  Device has migrated into the anterior axillary line  Regular  rate and rhythm, no murmur Abd-soft with active BS No Clubbing cyanosis  edema Skin-warm and dry A & Oriented  Grossly normal sensory and motor function  ECG:  Assessment and  Plan\  Ischemic cardiomyopathy  Implantable defibrillator The patient's device was interrogated.  The information was reviewed. No changes were made in the programming.       Hypertension-orthostatic hypotension-new  PVC  Device pocket pain-new  Renal insufficiency grade 3  Congestive heart failure-chronic-systolic   Without symptoms of ischemia  Euvolemic continue current meds  Recurrent falls without objective evidence of orthostasis today, but nothing on his ICD either, suspect vasomotor  We discussed the physiology of orthostatic intolerance including gravitational fluid shifts and the impact of hypertensive vascular disease on orthostasis and treatment options.  We discussed pharmacological options including midodrine, pyridostigmine, fludrocortisone.  We discussed nonpharmacological options including raising the HOB, isometric contraction upon standing, abdominal binders  thigh sleeves. We emphasized the importance of recognizing the prodrome and sitting prior to falling, safety in the shower and in the bathroom and the avoidance of dehydration   We will begin with a nonpharmacological approach with using his back  brace  Device pocket still with some discomfort, but no objective signs--will follow   Euvolemic continue current meds .

## 2020-08-29 NOTE — Patient Instructions (Signed)

## 2020-08-30 ENCOUNTER — Other Ambulatory Visit (HOSPITAL_COMMUNITY): Payer: Self-pay | Admitting: Physician Assistant

## 2020-08-30 DIAGNOSIS — I739 Peripheral vascular disease, unspecified: Secondary | ICD-10-CM | POA: Diagnosis not present

## 2020-08-30 DIAGNOSIS — E875 Hyperkalemia: Secondary | ICD-10-CM | POA: Diagnosis not present

## 2020-08-30 DIAGNOSIS — Z8679 Personal history of other diseases of the circulatory system: Secondary | ICD-10-CM | POA: Diagnosis not present

## 2020-08-30 DIAGNOSIS — R0989 Other specified symptoms and signs involving the circulatory and respiratory systems: Secondary | ICD-10-CM | POA: Diagnosis not present

## 2020-08-30 NOTE — Telephone Encounter (Signed)
Eliquis 5mg  refill request received. Patient is 79 years old, weight-72.6kg, Crea-1.70 on 02/29/2020, Diagnosis-Afib, and last seen by Dr. Caryl Comes on 08/29/2020. Dose is appropriate based on dosing criteria. Will send in refill to requested pharmacy.

## 2020-10-05 ENCOUNTER — Ambulatory Visit (INDEPENDENT_AMBULATORY_CARE_PROVIDER_SITE_OTHER): Payer: Medicare Other

## 2020-10-05 DIAGNOSIS — I480 Paroxysmal atrial fibrillation: Secondary | ICD-10-CM

## 2020-10-06 LAB — CUP PACEART REMOTE DEVICE CHECK
Battery Remaining Longevity: 54 mo
Battery Remaining Percentage: 66 %
Brady Statistic RA Percent Paced: 63 %
Brady Statistic RV Percent Paced: 4 %
Date Time Interrogation Session: 20211203011900
HighPow Impedance: 49 Ohm
Implantable Lead Implant Date: 20060512
Implantable Lead Implant Date: 20060512
Implantable Lead Location: 753859
Implantable Lead Location: 753860
Implantable Lead Model: 158
Implantable Lead Model: 5076
Implantable Lead Serial Number: 159477
Implantable Pulse Generator Implant Date: 20150123
Lead Channel Impedance Value: 394 Ohm
Lead Channel Impedance Value: 523 Ohm
Lead Channel Pacing Threshold Amplitude: 0.5 V
Lead Channel Pacing Threshold Amplitude: 0.8 V
Lead Channel Pacing Threshold Pulse Width: 0.4 ms
Lead Channel Pacing Threshold Pulse Width: 0.4 ms
Lead Channel Setting Pacing Amplitude: 2 V
Lead Channel Setting Pacing Amplitude: 2.4 V
Lead Channel Setting Pacing Pulse Width: 0.4 ms
Lead Channel Setting Sensing Sensitivity: 0.6 mV
Pulse Gen Serial Number: 112776

## 2020-10-08 DIAGNOSIS — N1831 Chronic kidney disease, stage 3a: Secondary | ICD-10-CM | POA: Diagnosis not present

## 2020-10-16 NOTE — Progress Notes (Signed)
Remote ICD transmission.   

## 2020-11-06 DIAGNOSIS — R7989 Other specified abnormal findings of blood chemistry: Secondary | ICD-10-CM | POA: Diagnosis not present

## 2020-12-03 ENCOUNTER — Other Ambulatory Visit: Payer: Self-pay

## 2020-12-03 DIAGNOSIS — I6523 Occlusion and stenosis of bilateral carotid arteries: Secondary | ICD-10-CM

## 2020-12-03 DIAGNOSIS — I739 Peripheral vascular disease, unspecified: Secondary | ICD-10-CM

## 2020-12-08 ENCOUNTER — Other Ambulatory Visit (HOSPITAL_COMMUNITY): Payer: Self-pay | Admitting: Internal Medicine

## 2020-12-10 ENCOUNTER — Other Ambulatory Visit: Payer: Self-pay | Admitting: Physician Assistant

## 2020-12-10 DIAGNOSIS — I5020 Unspecified systolic (congestive) heart failure: Secondary | ICD-10-CM | POA: Diagnosis not present

## 2020-12-10 DIAGNOSIS — I2510 Atherosclerotic heart disease of native coronary artery without angina pectoris: Secondary | ICD-10-CM | POA: Diagnosis not present

## 2020-12-10 DIAGNOSIS — N183 Chronic kidney disease, stage 3 unspecified: Secondary | ICD-10-CM | POA: Diagnosis not present

## 2020-12-10 DIAGNOSIS — E785 Hyperlipidemia, unspecified: Secondary | ICD-10-CM | POA: Diagnosis not present

## 2020-12-10 DIAGNOSIS — K7460 Unspecified cirrhosis of liver: Secondary | ICD-10-CM | POA: Diagnosis not present

## 2020-12-10 DIAGNOSIS — D509 Iron deficiency anemia, unspecified: Secondary | ICD-10-CM | POA: Diagnosis not present

## 2020-12-10 DIAGNOSIS — R7309 Other abnormal glucose: Secondary | ICD-10-CM | POA: Diagnosis not present

## 2020-12-10 DIAGNOSIS — I739 Peripheral vascular disease, unspecified: Secondary | ICD-10-CM | POA: Diagnosis not present

## 2020-12-10 DIAGNOSIS — I129 Hypertensive chronic kidney disease with stage 1 through stage 4 chronic kidney disease, or unspecified chronic kidney disease: Secondary | ICD-10-CM | POA: Diagnosis not present

## 2020-12-10 DIAGNOSIS — G8929 Other chronic pain: Secondary | ICD-10-CM | POA: Diagnosis not present

## 2020-12-10 NOTE — Telephone Encounter (Signed)
Pt's age 80, wt 72.6 kg, SCr 1.67, CrCl 36.83, SK 10.27/21.

## 2020-12-11 DIAGNOSIS — L821 Other seborrheic keratosis: Secondary | ICD-10-CM | POA: Diagnosis not present

## 2020-12-11 DIAGNOSIS — C44622 Squamous cell carcinoma of skin of right upper limb, including shoulder: Secondary | ICD-10-CM | POA: Diagnosis not present

## 2020-12-11 DIAGNOSIS — L578 Other skin changes due to chronic exposure to nonionizing radiation: Secondary | ICD-10-CM | POA: Diagnosis not present

## 2020-12-11 DIAGNOSIS — L570 Actinic keratosis: Secondary | ICD-10-CM | POA: Diagnosis not present

## 2020-12-13 DIAGNOSIS — N183 Chronic kidney disease, stage 3 unspecified: Secondary | ICD-10-CM | POA: Diagnosis not present

## 2020-12-21 ENCOUNTER — Ambulatory Visit (HOSPITAL_COMMUNITY): Payer: Medicare Other

## 2020-12-21 ENCOUNTER — Emergency Department (HOSPITAL_COMMUNITY)
Admission: EM | Admit: 2020-12-21 | Discharge: 2020-12-22 | Disposition: A | Discharge status: Home / Self Care | Payer: Medicare Other | Attending: Emergency Medicine | Admitting: Emergency Medicine

## 2020-12-21 ENCOUNTER — Ambulatory Visit: Payer: Medicare Other

## 2020-12-21 DIAGNOSIS — Z950 Presence of cardiac pacemaker: Secondary | ICD-10-CM | POA: Diagnosis not present

## 2020-12-21 DIAGNOSIS — I10 Essential (primary) hypertension: Secondary | ICD-10-CM | POA: Diagnosis not present

## 2020-12-21 DIAGNOSIS — R42 Dizziness and giddiness: Secondary | ICD-10-CM | POA: Diagnosis not present

## 2020-12-21 DIAGNOSIS — R531 Weakness: Secondary | ICD-10-CM | POA: Diagnosis not present

## 2020-12-21 DIAGNOSIS — G9389 Other specified disorders of brain: Secondary | ICD-10-CM | POA: Diagnosis not present

## 2020-12-21 DIAGNOSIS — I130 Hypertensive heart and chronic kidney disease with heart failure and stage 1 through stage 4 chronic kidney disease, or unspecified chronic kidney disease: Secondary | ICD-10-CM | POA: Insufficient documentation

## 2020-12-21 DIAGNOSIS — W19XXXA Unspecified fall, initial encounter: Secondary | ICD-10-CM | POA: Diagnosis not present

## 2020-12-21 DIAGNOSIS — Z87891 Personal history of nicotine dependence: Secondary | ICD-10-CM | POA: Diagnosis not present

## 2020-12-21 DIAGNOSIS — R55 Syncope and collapse: Secondary | ICD-10-CM | POA: Diagnosis not present

## 2020-12-21 DIAGNOSIS — Z951 Presence of aortocoronary bypass graft: Secondary | ICD-10-CM | POA: Diagnosis not present

## 2020-12-21 DIAGNOSIS — N189 Chronic kidney disease, unspecified: Secondary | ICD-10-CM | POA: Diagnosis not present

## 2020-12-21 DIAGNOSIS — S0990XD Unspecified injury of head, subsequent encounter: Secondary | ICD-10-CM | POA: Insufficient documentation

## 2020-12-21 DIAGNOSIS — I2510 Atherosclerotic heart disease of native coronary artery without angina pectoris: Secondary | ICD-10-CM | POA: Insufficient documentation

## 2020-12-21 DIAGNOSIS — I5022 Chronic systolic (congestive) heart failure: Secondary | ICD-10-CM | POA: Insufficient documentation

## 2020-12-21 DIAGNOSIS — G319 Degenerative disease of nervous system, unspecified: Secondary | ICD-10-CM | POA: Diagnosis not present

## 2020-12-21 DIAGNOSIS — Z043 Encounter for examination and observation following other accident: Secondary | ICD-10-CM | POA: Diagnosis not present

## 2020-12-21 DIAGNOSIS — R0902 Hypoxemia: Secondary | ICD-10-CM | POA: Diagnosis not present

## 2020-12-21 DIAGNOSIS — R296 Repeated falls: Secondary | ICD-10-CM | POA: Diagnosis not present

## 2020-12-22 ENCOUNTER — Other Ambulatory Visit: Payer: Self-pay

## 2020-12-22 ENCOUNTER — Encounter (HOSPITAL_COMMUNITY): Payer: Self-pay | Admitting: Emergency Medicine

## 2020-12-22 ENCOUNTER — Emergency Department (HOSPITAL_COMMUNITY): Payer: Medicare Other

## 2020-12-22 DIAGNOSIS — G9389 Other specified disorders of brain: Secondary | ICD-10-CM | POA: Diagnosis not present

## 2020-12-22 DIAGNOSIS — R55 Syncope and collapse: Secondary | ICD-10-CM | POA: Diagnosis not present

## 2020-12-22 DIAGNOSIS — R296 Repeated falls: Secondary | ICD-10-CM | POA: Diagnosis not present

## 2020-12-22 DIAGNOSIS — Z043 Encounter for examination and observation following other accident: Secondary | ICD-10-CM | POA: Diagnosis not present

## 2020-12-22 DIAGNOSIS — G319 Degenerative disease of nervous system, unspecified: Secondary | ICD-10-CM | POA: Diagnosis not present

## 2020-12-22 LAB — CBC
HCT: 37.4 % — ABNORMAL LOW (ref 39.0–52.0)
Hemoglobin: 12.8 g/dL — ABNORMAL LOW (ref 13.0–17.0)
MCH: 34.2 pg — ABNORMAL HIGH (ref 26.0–34.0)
MCHC: 34.2 g/dL (ref 30.0–36.0)
MCV: 100 fL (ref 80.0–100.0)
Platelets: 238 10*3/uL (ref 150–400)
RBC: 3.74 MIL/uL — ABNORMAL LOW (ref 4.22–5.81)
RDW: 12.8 % (ref 11.5–15.5)
WBC: 10.2 10*3/uL (ref 4.0–10.5)
nRBC: 0 % (ref 0.0–0.2)

## 2020-12-22 LAB — COMPREHENSIVE METABOLIC PANEL
ALT: 19 U/L (ref 0–44)
AST: 27 U/L (ref 15–41)
Albumin: 4 g/dL (ref 3.5–5.0)
Alkaline Phosphatase: 89 U/L (ref 38–126)
Anion gap: 15 (ref 5–15)
BUN: 35 mg/dL — ABNORMAL HIGH (ref 8–23)
CO2: 19 mmol/L — ABNORMAL LOW (ref 22–32)
Calcium: 9.6 mg/dL (ref 8.9–10.3)
Chloride: 105 mmol/L (ref 98–111)
Creatinine, Ser: 2.16 mg/dL — ABNORMAL HIGH (ref 0.61–1.24)
GFR, Estimated: 30 mL/min — ABNORMAL LOW (ref >60)
Glucose, Bld: 106 mg/dL — ABNORMAL HIGH (ref 70–99)
Potassium: 4.7 mmol/L (ref 3.5–5.1)
Sodium: 139 mmol/L (ref 135–145)
Total Bilirubin: 0.3 mg/dL (ref 0.3–1.2)
Total Protein: 6.9 g/dL (ref 6.5–8.1)

## 2020-12-22 MED ORDER — IOHEXOL 350 MG/ML SOLN: 100.0000 mL | Freq: Once | INTRAVENOUS | Status: DC | PRN

## 2020-12-22 NOTE — ED Provider Notes (Signed)
[facility] Emergency Department Provider Note MRN:  [id number]  Arrival date & time: 12/22/20     Chief Complaint   Fall   History of Present Illness   Connor Wilkerson is a 80 y.o. year-old male with a history of CAD, ICD presenting to the ED with chief complaint of falls.  Multiple falls over the past few days. Patient denies ever passing out. Had some head trauma with a fall a few days ago but not today. Again fell this evening but denies any injuries or pain. No neck pain, no back pain, no chest pain, no shortness of breath, no abdominal pain, no injuries to the arms or legs. Does not use a cane or a walker.  Review of Systems  A complete 10 system review of systems was obtained and all systems are negative except as noted in the HPI and PMH.   Patient's Health History    Past Medical History:  Diagnosis Date  . Abnormality of gait 02/07/2015  . AICD (automatic cardioverter/defibrillator) present    [employer]  . CAD (coronary artery disease)    Hx of Inf MI treated with CABG in 1999 // Myoview 11/16: mild inf-lat ischemia, EF 34, high risk // LHC 3/19: 3/3 bypass grafts patent  . Carotid artery disease ([place])    Followed by [facility] (Dr. Trula Slade) // [place] in 2/19: bilat ICA 40-59  . Chronic systolic CHF (congestive heart failure) ([place]) 01/01/2010   Echo 12/30/2017: Mild LVH, EF 30-35, diffuse HK, grade 1 diastolic dysfunction, mildly reduced RVSF // Echo 04/12/2016:  EF 35-40, PASP 39, mild to moderate TR, mild LAE // Echo (12/14):  Base/mid inferior and inferolateral HK, EF 45%, mild LAE, mildly reduced RVSF  . CKD (chronic kidney disease)   . Depression   . HLP (hyperkeratosis lenticularis perstans)   . HTN (hypertension)   . ICD (implantable cardiac defibrillator)  BSx    [employer]  . Ischemic cardiomyopathy   . Myocardial infarction ([place]) 1999  . Near syncope 09/21/2014  . Orthostatic hypotension   . PAF (paroxysmal atrial  fibrillation) ([place])   . Presence of permanent cardiac pacemaker   . Wears partial dentures    upper    Past Surgical History:  Procedure Laterality Date  . CARDIAC CATHETERIZATION N/A 12/11/2015   Procedure: Left Heart Cath and Coronary Angiography;  Surgeon: Charolette Forward, MD;  Location: [place] [facility];  Service: Cardiovascular;  Laterality: N/A;  . CARDIAC DEFIBRILLATOR PLACEMENT     Chemical engineer  . CARDIAC DEFIBRILLATOR PLACEMENT  04/08/2016   NOT MRI SAFE (DOCUMENT SCANNED IN SYSTEM  . COLONOSCOPY  10/06/2007   Mild sigmoid diverticulosis. Small internal hemorrhoids.   . CORONARY ARTERY BYPASS GRAFT    . ELBOW SURGERY Left    plastic bone replacement  . HERNIA REPAIR     x2  . LEFT HEART CATH AND CORS/GRAFTS ANGIOGRAPHY N/A 01/07/2018   Procedure: LEFT HEART CATH AND CORS/GRAFTS ANGIOGRAPHY;  Surgeon: Burnell Blanks, MD;  Location: [place] [facility];  Service: Cardiovascular;  Laterality: N/A;  . PERMANENT PACEMAKER GENERATOR CHANGE N/A 11/25/2013   Procedure: PERMANENT PACEMAKER GENERATOR CHANGE;  Surgeon: Deboraha Sprang, MD;  Location: [facility] C[facility];  Service: Cardiovascular;  Laterality: N/A;   . ROTATOR CUFF REPAIR Right 2006  . SINUS EXPLORATION  1987  . ULNAR NERVE TRANSPOSITION Left 07/19/2019   Procedure: Ulnar nerve release at the elbow with anterior transposition and flexor pronator lengthening, anterior  interosseous nerve transfer to the deep motor branch of the ulnar nerve at the forearm, flexor digitorum profundus tendon transfer of the ring and small to middle fingers, ulnar nerve release at the wrist, Guyons canal;  Surgeon: Roseanne Kaufman, MD;  Location: [place];  Service: Orthoped  . ULTRASOUND GUIDANCE FOR VASCULAR ACCESS  01/07/2018   Procedure: Ultrasound Guidance For Vascular Access;  Surgeon: Burnell Blanks, MD;  Location: [place] [facility];  Service: Cardiovascular;;    Family History  Problem Relation Age of Onset  . Heart disease Mother    . Heart attack Mother   . Heart disease Father   . Heart attack Father   . Heart disease Brother   . Heart attack Brother     Social History   Socioeconomic History  . Marital status: Married    Spouse name: Not on file  . Number of children: 1  . Years of education: HS +  . Highest education level: Not on file  Occupational History  . Occupation: retired  Tobacco Use  . Smoking status: Former Smoker    Types: Pipe    Quit date: 09/19/1999    Years since quitting: 21.2  . Smokeless tobacco: Never Used  . Tobacco comment: quit in 1998  Vaping Use  . Vaping Use: Never used  Substance and Sexual Activity  . Alcohol use: No    Alcohol/week: 0.0 standard drinks    Comment: recovering alcoholic, quit 02IOX ago (2000)  . Drug use: No  . Sexual activity: Not on file  Other Topics Concern  . Not on file  Social History Narrative   Lives at home w/ his wife.   Patient is right handed.   Patient drinks about 3 cups of soda daily.   Social Determinants of Health   Financial Resource Strain: Not on file  Food Insecurity: Not on file  Transportation Needs: Not on file  Physical Activity: Not on file  Stress: Not on file  Social Connections: Not on file  Intimate Partner Violence: Not on file     Physical Exam   Vitals:   12/22/20 0315 12/22/20 0330  BP: (!) 202/100 (!) 181/92  Pulse: 69 72  Resp:  20  Temp:  98.3 F (36.8 C)  SpO2: 98% 97%    CONSTITUTIONAL: Chronically ill-appearing, NAD NEURO:  Alert and oriented x 3, no focal deficits EYES:  eyes equal and reactive ENT/NECK:  no LAD, no JVD CARDIO: Regular rate, well-perfused, normal S1 and S2 PULM:  CTAB no wheezing or rhonchi GI/GU:  normal bowel sounds, non-distended, non-tender MSK/SPINE:  No gross deformities, no edema SKIN:  no rash, atraumatic PSYCH:  Appropriate speech and behavior  *Additional and/or pertinent findings included in MDM below  Diagnostic and Interventional Summary    EKG  Interpretation  Date/Time:  Saturday December 22 2020 00:13:15 EST Ventricular Rate:  70 PR Interval:    QRS Duration: 101 QT Interval:  385 QTC Calculation: 416 R Axis:   51 Text Interpretation: Sinus rhythm Borderline repolarization abnormality No significant change was found Confirmed by Gerlene Fee [phone]) on 12/22/2020 12:47:05 AM      Labs Reviewed  CBC - Abnormal; Notable for the following components:      Result Value   RBC 3.74 (*)    Hemoglobin 12.8 (*)    HCT 37.4 (*)    MCH 34.2 (*)    All other components within normal limits  COMPREHENSIVE METABOLIC PANEL - Abnormal; Notable for the  following components:   CO2 19 (*)    Glucose, Bld 106 (*)    BUN 35 (*)    Creatinine, Ser 2.16 (*)    GFR, Estimated 30 (*)    All other components within normal limits    CT HEAD WO CONTRAST  Final Result      Medications - No data to display   Procedures  /  Critical Care Procedures  ED Course and Medical Decision Making  I have reviewed the triage vital signs, the nursing notes, and pertinent available records from the EMR.  Listed above are laboratory and imaging tests that I personally ordered, reviewed, and interpreted and then considered in my medical decision making (see below for details).  Frequent falls, denies syncope or chest pain or concerning features. Normal vital signs, reassuring neurological exam. Patient seems to be a poor historian, does not seem to seek medical care very often. Will obtain screening labs, interrogate pacemaker, placed on cardiac monitor. Suspect would be able to be discharged, possibly with instructions to start using a walker at home.     Pacer interrogation is reassuring, work-up overall normal, patient continues to look and feel well with no complaints, appropriate for discharge.  Barth Kirks. Sedonia Small, [place] [email or web address]  Final Clinical Impressions(s) / ED Diagnoses      ICD-10-CM   1. Fall, initial encounter  W19.[facility]     ED Discharge Orders    None       Discharge Instructions Discussed with and Provided to Patient:     Discharge Instructions     You were evaluated in the Emergency Department and after careful evaluation, we did not find any emergent condition requiring admission or further testing in the hospital.  Your exam/testing today was overall reassuring.  Recommend using a cane or walker at home to prevent falls.  Please return to the Emergency Department if you experience any worsening of your condition.  Thank you for allowing [place] to be a part of your care.        Maudie Flakes, MD 12/22/20 [phone]

## 2020-12-22 NOTE — ED Triage Notes (Signed)
Pt presents to ED BIB [place] BIB [facility]. Pt reports multiple falls in the past few days. Pt also c/o cough recently. Pt disoriented to time. EMS reports orthostatic changes. 177/80

## 2020-12-22 NOTE — Discharge Instructions (Addendum)
You were evaluated in the Emergency Department and after careful evaluation, we did not find any emergent condition requiring admission or further testing in the hospital.  Your exam/testing today was overall reassuring.  Recommend using a cane or walker at home to prevent falls.  Please return to the Emergency Department if you experience any worsening of your condition.  Thank you for allowing [place] to be a part of your care.

## 2020-12-22 NOTE — ED Notes (Addendum)
Pt wife Connor Wilkerson was called on her home phone and stated she is coming from [place] but will come and pick him up. Wife was explained the findings. Pt has been dressed and will be placed in the waiting room until wife picks him up. Pt is A&ox4 at time of discharge. Pt VSS.

## 2020-12-22 NOTE — ED Notes (Signed)
Pt is sitting in wheelchair in waiting room with sort tech. Sort tech understand that patient wife is on her way to pick him up.

## 2020-12-22 NOTE — ED Notes (Signed)
Pt wife Quillan Whitter called to get an update please call back [phone]

## 2020-12-22 NOTE — ED Notes (Signed)
Attempted to interrogate pacemaker. Human resources officer say it is unable to transmit info. Called [employer] and awaiting a call back. Dr. Sedonia Small notifed

## 2021-01-04 ENCOUNTER — Ambulatory Visit (INDEPENDENT_AMBULATORY_CARE_PROVIDER_SITE_OTHER): Payer: Medicare Other

## 2021-01-04 DIAGNOSIS — I255 Ischemic cardiomyopathy: Secondary | ICD-10-CM | POA: Diagnosis not present

## 2021-01-04 DIAGNOSIS — I480 Paroxysmal atrial fibrillation: Secondary | ICD-10-CM

## 2021-01-07 LAB — CUP PACEART REMOTE DEVICE CHECK
Battery Remaining Longevity: 54 mo
Battery Remaining Percentage: 62 %
Brady Statistic RA Percent Paced: 75 %
Brady Statistic RV Percent Paced: 11 %
Date Time Interrogation Session: 20220304004100
HighPow Impedance: 49 Ohm
Implantable Lead Implant Date: 20060512
Implantable Lead Implant Date: 20060512
Implantable Lead Location: 753859
Implantable Lead Location: 753860
Implantable Lead Model: 158
Implantable Lead Model: 5076
Implantable Lead Serial Number: 159477
Implantable Pulse Generator Implant Date: 20150123
Lead Channel Impedance Value: 415 Ohm
Lead Channel Impedance Value: 591 Ohm
Lead Channel Pacing Threshold Amplitude: 0.5 V
Lead Channel Pacing Threshold Amplitude: 0.8 V
Lead Channel Pacing Threshold Pulse Width: 0.4 ms
Lead Channel Pacing Threshold Pulse Width: 0.4 ms
Lead Channel Setting Pacing Amplitude: 2 V
Lead Channel Setting Pacing Amplitude: 2.4 V
Lead Channel Setting Pacing Pulse Width: 0.4 ms
Lead Channel Setting Sensing Sensitivity: 0.6 mV
Pulse Gen Serial Number: 112776

## 2021-01-15 NOTE — Progress Notes (Signed)
Remote ICD transmission.   

## 2021-01-16 ENCOUNTER — Ambulatory Visit (INDEPENDENT_AMBULATORY_CARE_PROVIDER_SITE_OTHER)
Admission: RE | Admit: 2021-01-16 | Discharge: 2021-01-16 | Disposition: A | Discharge status: Home / Self Care | Payer: Medicare Other | Source: Ambulatory Visit | Attending: Vascular Surgery | Admitting: Vascular Surgery

## 2021-01-16 ENCOUNTER — Other Ambulatory Visit: Payer: Self-pay

## 2021-01-16 ENCOUNTER — Ambulatory Visit (INDEPENDENT_AMBULATORY_CARE_PROVIDER_SITE_OTHER): Payer: Medicare Other | Admitting: Physician Assistant

## 2021-01-16 ENCOUNTER — Ambulatory Visit (HOSPITAL_COMMUNITY)
Admission: RE | Admit: 2021-01-16 | Discharge: 2021-01-16 | Disposition: A | Discharge status: Home / Self Care | Payer: Medicare Other | Source: Ambulatory Visit | Attending: Vascular Surgery | Admitting: Vascular Surgery

## 2021-01-16 VITALS — BP 151/86 | HR 64 | Temp 98.1°F | Resp 20 | Ht 68.0 in | Wt 163.1 lb

## 2021-01-16 DIAGNOSIS — I255 Ischemic cardiomyopathy: Secondary | ICD-10-CM

## 2021-01-16 DIAGNOSIS — I739 Peripheral vascular disease, unspecified: Secondary | ICD-10-CM

## 2021-01-16 DIAGNOSIS — I6523 Occlusion and stenosis of bilateral carotid arteries: Secondary | ICD-10-CM

## 2021-01-16 NOTE — Progress Notes (Signed)
History of Present Illness:  Patient is a 80 y.o. year old male who presents for evaluation of PAD and Carotid stenosis.  The patient currently describes a cramping sensation in the bottom of his feet at night.  He puts lotion on them and it helps him sleep.  He denise pain, symptoms of claudication or true rest pain.   He has a history of asymptomatic carotid stenosis 40-59% on previous carotid duplex.  He denise symptoms of stroke/TIA.    He takes Eliquis for Afib, Plavix and Crestor daily.  Past Medical History:  Diagnosis Date  . Abnormality of gait 02/07/2015  . AICD (automatic cardioverter/defibrillator) present    [employer]  . CAD (coronary artery disease)    Hx of Inf MI treated with CABG in 1999 // Myoview 11/16: mild inf-lat ischemia, EF 34, high risk // LHC 3/19: 3/3 bypass grafts patent  . Carotid artery disease ([place])    Followed by [facility] (Dr. Trula Slade) // [place] in 2/19: bilat ICA 40-59  . Chronic systolic CHF (congestive heart failure) ([place]) 01/01/2010   Echo 12/30/2017: Mild LVH, EF 30-35, diffuse HK, grade 1 diastolic dysfunction, mildly reduced RVSF // Echo 04/12/2016:  EF 35-40, PASP 39, mild to moderate TR, mild LAE // Echo (12/14):  Base/mid inferior and inferolateral HK, EF 45%, mild LAE, mildly reduced RVSF  . CKD (chronic kidney disease)   . Depression   . HLP (hyperkeratosis lenticularis perstans)   . HTN (hypertension)   . ICD (implantable cardiac defibrillator)  BSx    [employer]  . Ischemic cardiomyopathy   . Myocardial infarction ([place]) 1999  . Near syncope 09/21/2014  . Orthostatic hypotension   . PAF (paroxysmal atrial fibrillation) ([place])   . Presence of permanent cardiac pacemaker   . Wears partial dentures    upper    Past Surgical History:  Procedure Laterality Date  . CARDIAC CATHETERIZATION N/A 12/11/2015   Procedure: Left Heart Cath and Coronary Angiography;  Surgeon: Charolette Forward, MD;  Location: [facility];  Service:  Cardiovascular;  Laterality: N/A;  . CARDIAC DEFIBRILLATOR PLACEMENT     Chemical engineer  . CARDIAC DEFIBRILLATOR PLACEMENT  04/08/2016   NOT MRI SAFE (DOCUMENT SCANNED IN SYSTEM  . COLONOSCOPY  10/06/2007   Mild sigmoid diverticulosis. Small internal hemorrhoids.   . CORONARY ARTERY BYPASS GRAFT    . ELBOW SURGERY Left    plastic bone replacement  . HERNIA REPAIR     x2  . LEFT HEART CATH AND CORS/GRAFTS ANGIOGRAPHY N/A 01/07/2018   Procedure: LEFT HEART CATH AND CORS/GRAFTS ANGIOGRAPHY;  Surgeon: Burnell Blanks, MD;  Location: [place] [facility];  Service: Cardiovascular;  Laterality: N/A;  . PERMANENT PACEMAKER GENERATOR CHANGE N/A 11/25/2013   Procedure: PERMANENT PACEMAKER GENERATOR CHANGE;  Surgeon: Deboraha Sprang, MD;  Location: [facility];  Service: Cardiovascular;  Laterality: N/A;   . ROTATOR CUFF REPAIR Right 2006  . SINUS EXPLORATION  1987  . ULNAR NERVE TRANSPOSITION Left 07/19/2019   Procedure: Ulnar nerve release at the elbow with anterior transposition and flexor pronator lengthening, anterior interosseous nerve transfer to the deep motor branch of the ulnar nerve at the forearm, flexor digitorum profundus tendon transfer of the ring and small to middle fingers, ulnar nerve release at the wrist, Guyons canal;  Surgeon: Roseanne Kaufman, MD;  Location: [facility];  Service: Orthoped  . ULTRASOUND GUIDANCE FOR VASCULAR ACCESS  01/07/2018   Procedure: Ultrasound Guidance For  Vascular Access;  Surgeon: Burnell Blanks, MD;  Location: [facility];  Service: Cardiovascular;;    ROS:   General:  No weight loss, Fever, chills  HEENT: No recent headaches, no nasal bleeding, no visual changes, no sore throat  Neurologic: No dizziness, blackouts, seizures. No recent symptoms of stroke or mini- stroke. No recent episodes of slurred speech, or temporary blindness.  Cardiac: No recent episodes of chest pain/pressure, no shortness of breath at rest.  No shortness of  breath with exertion.  Denies history of atrial fibrillation or irregular heartbeat  Vascular: No history of rest pain in feet.  No history of claudication.  No history of non-healing ulcer, No history of DVT   Pulmonary: No home oxygen, no productive cough, no hemoptysis,  No asthma or wheezing  Musculoskeletal:  [ ]  Arthritis, [ ]  Low back pain,  [ ]  Joint pain  Hematologic:No history of hypercoagulable state.  No history of easy bleeding.  No history of anemia  Gastrointestinal: No hematochezia or melena,  No gastroesophageal reflux, no trouble swallowing  Urinary: [ ]  chronic Kidney disease, [ ]  on HD - [ ]  MWF or [ ]  TTHS, [ ]  Burning with urination, [ ]  Frequent urination, [ ]  Difficulty urinating;   Skin: No rashes  Psychological: No history of anxiety,  No history of depression  Social History Social History   Tobacco Use  . Smoking status: Former Smoker    Types: Pipe    Quit date: 09/19/1999    Years since quitting: 21.3  . Smokeless tobacco: Never Used  . Tobacco comment: quit in 1998  Vaping Use  . Vaping Use: Never used  Substance Use Topics  . Alcohol use: No    Alcohol/week: 0.0 standard drinks    Comment: recovering alcoholic, quit 99BZJ ago (2000)  . Drug use: No    Family History Family History  Problem Relation Age of Onset  . Heart disease Mother   . Heart attack Mother   . Heart disease Father   . Heart attack Father   . Heart disease Brother   . Heart attack Brother     Allergies  Allergies  Allergen Reactions  . Tramadol Other (See Comments)    Unknown      Current Outpatient Medications  Medication Sig Dispense Refill  . amLODipine (NORVASC) 5 MG tablet Take 5 mg by mouth daily.    Marland Kitchen buPROPion (WELLBUTRIN XL) 300 MG 24 hr tablet Take 300 mg by mouth at bedtime.    . clopidogrel (PLAVIX) 75 MG tablet Take 1 tablet (75 mg total) by mouth at bedtime. 90 tablet 3  . colchicine 0.6 MG tablet Take 0.6 mg by mouth daily.     .  cyclobenzaprine (FLEXERIL) 5 MG tablet Take 5 mg by mouth 3 (three) times daily as needed for muscle spasms.    Marland Kitchen ELIQUIS 5 MG TABS tablet TAKE 1 TABLET BY MOUTH TWICE A DAY (Patient taking differently: Take 5 mg by mouth 2 (two) times daily.) 60 tablet 5  . escitalopram (LEXAPRO) 10 MG tablet Take 10 mg by mouth 2 (two) times daily.    . famotidine (PEPCID) 20 MG tablet Take 20 mg by mouth 2 (two) times daily.    Marland Kitchen gabapentin (NEURONTIN) 300 MG capsule Take 600 mg by mouth at bedtime.    . nitroGLYCERIN (NITROSTAT) 0.4 MG SL tablet Place 1 tablet (0.4 mg total) under the tongue every 5 (five) minutes as needed for chest pain.  25 tablet 5  . oxyCODONE-acetaminophen (PERCOCET) 10-325 MG tablet Take 1 tablet by mouth 4 (four) times daily as needed for pain.    Marland Kitchen PREVIDENT 5000 BOOSTER PLUS 1.1 % PSTE See admin instructions.    . rosuvastatin (CRESTOR) 40 MG tablet TAKE 1 TABLET BY MOUTH EVERY DAY (Patient taking differently: Take 40 mg by mouth daily.) 90 tablet 2  . spironolactone (ALDACTONE) 25 MG tablet TAKE 1 TABLET (25 MG TOTAL) BY MOUTH DAILY AT NIGHT ONLY (Patient taking differently: Take 25 mg by mouth at bedtime.) 90 tablet 3  . traZODone (DESYREL) 50 MG tablet Take 50 mg by mouth at bedtime as needed for sleep.    Marland Kitchen triamcinolone cream (KENALOG) 0.1 % Apply 1 application topically as needed for rash.    . carvedilol (COREG) 6.25 MG tablet Take 1 tablet (6.25 mg total) by mouth 2 (two) times daily with a meal. 180 tablet 3   No current facility-administered medications for this visit.    Physical Examination  Vitals:   01/16/21 1413  BP: (!) 151/86  Pulse: 64  Resp: 20  Temp: 98.1 F (36.7 C)  TempSrc: Temporal  SpO2: 93%  Weight: 163 lb 1.6 oz (74 kg)  Height: 5\' 8"  (1.727 m)    Body mass index is 24.8 kg/m.  General:  Alert and oriented, no acute distress HEENT: Normal Neck: No bruit or JVD Pulmonary: Clear to auscultation bilaterally Cardiac: Regular Rate and Rhythm  without murmur Abdomen: Soft, non-tender, non-distended, no mass, no scars Skin: No rash Extremity Pulses:  2+ radial, brachial, femoral, dorsalis pedis  pulses bilaterally Musculoskeletal: No deformity or edema  Neurologic: Upper and lower extremity motor  and symmetric  DATA:     ABI Findings:  +---------+------------------+-----+--------+--------+  Right  Rt Pressure (mmHg)IndexWaveformComment   +---------+------------------+-----+--------+--------+  Brachial 169                     +---------+------------------+-----+--------+--------+  PTA   230        1.36 biphasic      +---------+------------------+-----+--------+--------+  DP    230        1.36 biphasic      +---------+------------------+-----+--------+--------+  Great Toe110        0.65           +---------+------------------+-----+--------+--------+   +---------+------------------+-----+---------+-------+  Left   Lt Pressure (mmHg)IndexWaveform Comment  +---------+------------------+-----+---------+-------+  Brachial 168                      +---------+------------------+-----+---------+-------+  PTA   230        1.36 triphasic      +---------+------------------+-----+---------+-------+  DP    230        1.36 triphasic      +---------+------------------+-----+---------+-------+  Great Toe109        0.64            +---------+------------------+-----+---------+-------+   +-------+-----------+-----------+------------+------------+  ABI/TBIToday's ABIToday's TBIPrevious ABIPrevious TBI  +-------+-----------+-----------+------------+------------+  Right [place]     0.65                  +-------+-----------+-----------+------------+------------+  Left       0.64                   +-------+-----------+-----------+------------+------------+        Arterial wall calcification precludes accurate ankle pressures and ABIs.    Summary:  Right: Resting right ankle-brachial index indicates noncompressible right  lower extremity arteries. The right toe-brachial  index is abnormal.   Left: Resting left ankle-brachial index indicates noncompressible left  lower extremity arteries. The left toe-brachial index is abnormal.     Right Carotid Findings:  +----------+--------+--------+--------+------------------------------+-----  ----+       PSV cm/sEDV cm/sStenosisPlaque Description        Comments   +----------+--------+--------+--------+------------------------------+-----  ----+  CCA Prox 61   9        heterogenous                +----------+--------+--------+--------+------------------------------+-----  ----+  CCA Mid  69   12       heterogenous                +----------+--------+--------+--------+------------------------------+-----  ----+  CCA Distal52   11       heterogenous and calcific    Shadowing  +----------+--------+--------+--------+------------------------------+-----  ----+  ICA Prox 183   48   40-59% heterogenous, calcific and    Shadowing                   irregular                  +----------+--------+--------+--------+------------------------------+-----  ----+  ICA Mid  94   22       heterogenous                +----------+--------+--------+--------+------------------------------+-----  ----+  ICA Distal83   11       heterogenous                +----------+--------+--------+--------+------------------------------+-----  ----+  ECA    90   8        heterogenous                 +----------+--------+--------+--------+------------------------------+-----  ----+   +----------+--------+-------+----------------+-------------------+       PSV cm/sEDV cmsDescribe    Arm Pressure (mmHG)  +----------+--------+-------+----------------+-------------------+  [id number]       Multiphasic, WNL            +----------+--------+-------+----------------+-------------------+   +---------+--------+--+--------+--+---------+  VertebralPSV cm/s57EDV cm/s12Antegrade  +---------+--------+--+--------+--+---------+      Left Carotid Findings:  +----------+--------+--------+--------+-------------------------+---------+        PSV cm/sEDV cm/sStenosisPlaque Description    Comments    +----------+--------+--------+--------+-------------------------+---------+   CCA Prox 99   14                            +----------+--------+--------+--------+-------------------------+---------+   CCA Mid  80   11       heterogenous               +----------+--------+--------+--------+-------------------------+---------+   CCA Distal63   10       heterogenous and calcific        +----------+--------+--------+--------+-------------------------+---------+   ICA Prox 154   42   40-59% heterogenous and  calcificShadowing  +----------+--------+--------+--------+-------------------------+---------+   ICA Mid  62   13                            +----------+--------+--------+--------+-------------------------+---------+   ICA Distal51   12                            +----------+--------+--------+--------+-------------------------+---------+   ECA    66   9        heterogenous                +----------+--------+--------+--------+-------------------------+---------+    +----------+--------+--------+----------------+-------------------+  PSV cm/sEDV cm/sDescribe    Arm Pressure (mmHG)  +----------+--------+--------+----------------+-------------------+  [id number]       Multiphasic, WNL            +----------+--------+--------+----------------+-------------------+   +---------+--------+--+--------+-+---------+  VertebralPSV cm/s50EDV cm/s9Antegrade  +---------+--------+--+--------+-+---------+    Summary:  Right Carotid: Velocities in the right ICA are consistent with a 40-59%         stenosis.   Left Carotid: Velocities in the left ICA are consistent with a 40-59%  stenosis.   Vertebrals: Bilateral vertebral arteries demonstrate antegrade flow.    ASSESSMENT:  Asymptomatic Carotid stenosis  Right Carotid: Velocities in the right ICA are consistent with a 40-59%         stenosis.   Left Carotid: Velocities in the left ICA are consistent with a 40-59%  stenosis.   PAD with calcified arteries and triphasic flow B LE.  He has no symptoms of claudication, true rest pain or non healing wounds.  He has palpable pedal pulses.   PLAN: Stay as active as tolerates , cont. Plavix, Eliquis and Statin daily.  F/U in 1 year for repeat Carotid duplex.  If he has symptoms of stroke or TIA he will call 911.  The  40-59% stenosis has not changed in 2 years time.     Roxy Horseman PA-C [facility] Office: [phone]  MD in clinic [place]

## 2021-02-21 DIAGNOSIS — L578 Other skin changes due to chronic exposure to nonionizing radiation: Secondary | ICD-10-CM | POA: Diagnosis not present

## 2021-02-21 DIAGNOSIS — L821 Other seborrheic keratosis: Secondary | ICD-10-CM | POA: Diagnosis not present

## 2021-02-21 DIAGNOSIS — L570 Actinic keratosis: Secondary | ICD-10-CM | POA: Diagnosis not present

## 2021-02-22 DIAGNOSIS — Z6824 Body mass index (BMI) 24.0-24.9, adult: Secondary | ICD-10-CM | POA: Diagnosis not present

## 2021-02-22 DIAGNOSIS — G894 Chronic pain syndrome: Secondary | ICD-10-CM | POA: Diagnosis not present

## 2021-02-22 DIAGNOSIS — I10 Essential (primary) hypertension: Secondary | ICD-10-CM | POA: Diagnosis not present

## 2021-02-22 DIAGNOSIS — R2681 Unsteadiness on feet: Secondary | ICD-10-CM | POA: Diagnosis not present

## 2021-02-27 DIAGNOSIS — M19022 Primary osteoarthritis, left elbow: Secondary | ICD-10-CM | POA: Diagnosis not present

## 2021-02-27 DIAGNOSIS — M25522 Pain in left elbow: Secondary | ICD-10-CM | POA: Diagnosis not present

## 2021-03-11 ENCOUNTER — Telehealth: Payer: Self-pay | Admitting: Internal Medicine

## 2021-03-11 NOTE — Telephone Encounter (Signed)
New message:     Pt have some chip bones in his elbow. His wife  question is, if he needs surgery, does Dr Caryl Comes thinks his heart is strong enough for it? Also if Dr Caryl Comes thinks he is good for surgery, is there an Orthopedic doctor he recommends?

## 2021-03-11 NOTE — Telephone Encounter (Signed)
RN returned call to patients wife Olin Hauser who states after today's appointment and X-ray it appears patient may need surgery to remove the chipped bone fragments. Patient has an appointment tomorrow with an orthopedic office in [facility] tomorrow.  Patient and wife would like to have Dr.Klein's recommendation for surgeons at [facility] if needed. RN advised that I would send Dr.Klein and his nurse a note. Patients wife verbalized understanding.

## 2021-03-12 DIAGNOSIS — M19022 Primary osteoarthritis, left elbow: Secondary | ICD-10-CM | POA: Diagnosis not present

## 2021-03-12 NOTE — Telephone Encounter (Signed)
Spoke with pt's wife, DPR who states pt is possibly going to need surgery on his elbow.  Pt is seeing ortho in [place] today.  She is asking if pt's heart is "stable" enough for surgery and for recommendation of surgeon with [facility].  Pt's wife advised if pt needs surgery a surgical clearance will be requested from cardiology.  Requested pt's wife contact office after pt has seen ortho today if surgery is needed.  Pt's wife verbalizes understanding and agrees with current plan.

## 2021-03-15 ENCOUNTER — Telehealth: Payer: Self-pay | Admitting: *Deleted

## 2021-03-15 NOTE — Telephone Encounter (Signed)
Call transferred into traige.  Pt's wife stated pt fell in the yard. A neighbor called her to tell her her husband was laying in the yard.  They came over and helped him up. He insists to her that he is fine and that he just stumbled.  Did not pass out. She checked BP when he came in the house it was 120s/50.  No obvious injury.  Scrape on arm.  Bent down to pick up a stick and lost his balance in doing so.  He denies hitting his head.   He is resting currently She will send a transmission for his device for review by device clinic. He takes Plavix and Eliquis and just to be sure we discussed what to look for and will take him to the ER for any altered level of consciousness or neuro changes.

## 2021-03-15 NOTE — Telephone Encounter (Signed)
ntoed

## 2021-03-15 NOTE — Telephone Encounter (Signed)
Spoke to patients wife [name] (on [place]). Advised there are no alerts found on the remote transmission. Presenting rhythm is SR 63 bpm. Advised to follow up with ED care if needed. Verbalized understanding.

## 2021-03-26 ENCOUNTER — Encounter: Payer: Self-pay | Admitting: Student

## 2021-03-26 ENCOUNTER — Other Ambulatory Visit: Payer: Self-pay

## 2021-03-26 ENCOUNTER — Ambulatory Visit (INDEPENDENT_AMBULATORY_CARE_PROVIDER_SITE_OTHER): Payer: Medicare Other | Admitting: Student

## 2021-03-26 VITALS — BP 132/80 | HR 68 | Ht 70.0 in | Wt 161.0 lb

## 2021-03-26 DIAGNOSIS — I255 Ischemic cardiomyopathy: Secondary | ICD-10-CM | POA: Diagnosis not present

## 2021-03-26 DIAGNOSIS — I951 Orthostatic hypotension: Secondary | ICD-10-CM | POA: Diagnosis not present

## 2021-03-26 DIAGNOSIS — I10 Essential (primary) hypertension: Secondary | ICD-10-CM

## 2021-03-26 DIAGNOSIS — I480 Paroxysmal atrial fibrillation: Secondary | ICD-10-CM

## 2021-03-26 DIAGNOSIS — I5022 Chronic systolic (congestive) heart failure: Secondary | ICD-10-CM

## 2021-03-26 DIAGNOSIS — I2510 Atherosclerotic heart disease of native coronary artery without angina pectoris: Secondary | ICD-10-CM | POA: Diagnosis not present

## 2021-03-26 LAB — CUP PACEART INCLINIC DEVICE CHECK
Date Time Interrogation Session: 20220524123703
HighPow Impedance: 51 Ohm
Implantable Lead Implant Date: 20060512
Implantable Lead Implant Date: 20060512
Implantable Lead Location: 753859
Implantable Lead Location: 753860
Implantable Lead Model: 158
Implantable Lead Model: 5076
Implantable Lead Serial Number: 159477
Implantable Pulse Generator Implant Date: 20150123
Lead Channel Impedance Value: 413 Ohm
Lead Channel Impedance Value: 612 Ohm
Lead Channel Pacing Threshold Amplitude: 0.6 V
Lead Channel Pacing Threshold Amplitude: 0.9 V
Lead Channel Pacing Threshold Pulse Width: 0.4 ms
Lead Channel Pacing Threshold Pulse Width: 0.4 ms
Lead Channel Sensing Intrinsic Amplitude: 1.8 mV
Lead Channel Sensing Intrinsic Amplitude: 9.2 mV
Lead Channel Setting Pacing Amplitude: 2 V
Lead Channel Setting Pacing Amplitude: 2.4 V
Lead Channel Setting Pacing Pulse Width: 0.4 ms
Lead Channel Setting Sensing Sensitivity: 0.6 mV
Pulse Gen Serial Number: 112776

## 2021-03-26 LAB — BASIC METABOLIC PANEL
BUN/Creatinine Ratio: 15 (ref 10–24)
BUN: 28 mg/dL — ABNORMAL HIGH (ref 8–27)
CO2: 20 mmol/L (ref 20–29)
Calcium: 9.4 mg/dL (ref 8.6–10.2)
Chloride: 104 mmol/L (ref 96–106)
Creatinine, Ser: 1.81 mg/dL — ABNORMAL HIGH (ref 0.76–1.27)
Glucose: 91 mg/dL (ref 65–99)
Potassium: 4.9 mmol/L (ref 3.5–5.2)
Sodium: 138 mmol/L (ref 134–144)
eGFR: 38 mL/min/{1.73_m2} — ABNORMAL LOW (ref >59)

## 2021-03-26 LAB — CBC
Hematocrit: 40.8 % (ref 37.5–51.0)
Hemoglobin: 13.6 g/dL (ref 13.0–17.7)
MCH: 32.9 pg (ref 26.6–33.0)
MCHC: 33.3 g/dL (ref 31.5–35.7)
MCV: 99 fL — ABNORMAL HIGH (ref 79–97)
Platelets: 257 10*3/uL (ref 150–450)
RBC: 4.13 x10E6/uL — ABNORMAL LOW (ref 4.14–5.80)
RDW: 12.4 % (ref 11.6–15.4)
WBC: 7 10*3/uL (ref 3.4–10.8)

## 2021-03-26 NOTE — Patient Instructions (Signed)
Medication Instructions:  Your physician recommends that you continue on your current medications as directed. Please refer to the Current Medication list given to you today.  *If you need a refill on your cardiac medications before your next appointment, please call your pharmacy*   Lab Work: TODAY: BMET, CBC  If you have labs (blood work) drawn today and your tests are completely normal, you will receive your results only by: Marland Kitchen MyChart Message (if you have MyChart) OR . A paper copy in the mail If you have any lab test that is abnormal or we need to change your treatment, we will call you to review the results.  Follow-Up: At [facility], you and your health needs are our priority.  As part of our continuing mission to provide you with exceptional heart care, we have created designated Provider Care Teams.  These Care Teams include your primary Cardiologist (physician) and Advanced Practice Providers (APPs -  Physician Assistants and Nurse Practitioners) who all work together to provide you with the care you need, when you need it.  Your next appointment:   6 month(s)  The format for your next appointment:   In Person  Provider:   You may see Dr Caryl Comes or one of the following Advanced Practice Providers on your designated Care Team:    Chanetta Marshall, NP  Tommye Standard, PA-C  Legrand Como "[place]" [place], [place]

## 2021-03-26 NOTE — Progress Notes (Signed)
Electrophysiology Office Note Date: 03/26/2021  ID:  Connor Wilkerson, Connor Wilkerson [birth year], MRN [id number]  PCP: Ernestene Kiel, MD Primary Cardiologist: Virl Axe, MD Electrophysiologist: Virl Axe, MD   CC: Routine ICD follow-up  Connor Wilkerson is a 80 y.o. male seen today for Virl Axe, MD for routine electrophysiology followup.  Since last being seen in our clinic the patient reports doing well overall. He continues to have non-specific ICD site pain. This is stable and without change to the site. He has chronic dizziness and lightheadedness with rapid position changes. He has not tried wearing his back brace or thigh sleeves. He denies chest pain.  He has not had ICD shocks.   Device History: BSCi dual chamber ICD, implanted 03/14/05, gen change 11/25/13 to sub-pectoral placement, Dr. Caryl Comes  Past Medical History:  Diagnosis Date  . Abnormality of gait 02/07/2015  . AICD (automatic cardioverter/defibrillator) present    [employer]  . CAD (coronary artery disease)    Hx of Inf MI treated with CABG in 1999 // Myoview 11/16: mild inf-lat ischemia, EF 34, high risk // LHC 3/19: 3/3 bypass grafts patent  . Carotid artery disease ([place])    Followed by [facility] (Dr. Trula Slade) // [place] in 2/19: bilat ICA 40-59  . Chronic systolic CHF (congestive heart failure) ([place]) 01/01/2010   Echo 12/30/2017: Mild LVH, EF 30-35, diffuse HK, grade 1 diastolic dysfunction, mildly reduced RVSF // Echo 04/12/2016:  EF 35-40, PASP 39, mild to moderate TR, mild LAE // Echo (12/14):  Base/mid inferior and inferolateral HK, EF 45%, mild LAE, mildly reduced RVSF  . CKD (chronic kidney disease)   . Depression   . HLP (hyperkeratosis lenticularis perstans)   . HTN (hypertension)   . ICD (implantable cardiac defibrillator)  BSx    [employer]  . Ischemic cardiomyopathy   . Myocardial infarction ([place]) 1999  . Near syncope 09/21/2014  . Orthostatic hypotension   . PAF (paroxysmal atrial  fibrillation) ([place])   . Presence of permanent cardiac pacemaker   . Wears partial dentures    upper   Past Surgical History:  Procedure Laterality Date  . CARDIAC CATHETERIZATION N/A 12/11/2015   Procedure: Left Heart Cath and Coronary Angiography;  Surgeon: Charolette Forward, MD;  Location: [facility];  Service: Cardiovascular;  Laterality: N/A;  . CARDIAC DEFIBRILLATOR PLACEMENT     Chemical engineer  . CARDIAC DEFIBRILLATOR PLACEMENT  04/08/2016   NOT MRI SAFE (DOCUMENT SCANNED IN SYSTEM  . COLONOSCOPY  10/06/2007   Mild sigmoid diverticulosis. Small internal hemorrhoids.   . CORONARY ARTERY BYPASS GRAFT    . ELBOW SURGERY Left    plastic bone replacement  . HERNIA REPAIR     x2  . LEFT HEART CATH AND CORS/GRAFTS ANGIOGRAPHY N/A 01/07/2018   Procedure: LEFT HEART CATH AND CORS/GRAFTS ANGIOGRAPHY;  Surgeon: Burnell Blanks, MD;  Location: [facility];  Service: Cardiovascular;  Laterality: N/A;  . PERMANENT PACEMAKER GENERATOR CHANGE N/A 11/25/2013   Procedure: PERMANENT PACEMAKER GENERATOR CHANGE;  Surgeon: Deboraha Sprang, MD;  [facility]: [facility] CATH [facility];  Service: Cardiovascular;  Laterality: N/A;   . ROTATOR CUFF REPAIR Right 2006  . SINUS EXPLORATION  1987  . ULNAR NERVE TRANSPOSITION Left 07/19/2019   Procedure: Ulnar nerve release at the elbow with anterior transposition and flexor pronator lengthening, anterior interosseous nerve transfer to the deep motor branch of the ulnar nerve at the forearm, flexor digitorum profundus tendon transfer of the ring and  small to middle fingers, ulnar nerve release at the wrist, Guyons canal;  Surgeon: Roseanne Kaufman, MD;  Location: [facility];  Service: Orthoped  . ULTRASOUND GUIDANCE FOR VASCULAR ACCESS  01/07/2018   Procedure: Ultrasound Guidance For Vascular Access;  Surgeon: Burnell Blanks, MD;  Location: [facility];  Service: Cardiovascular;;    Current Outpatient Medications  Medication Sig Dispense Refill  .  amLODipine (NORVASC) 5 MG tablet Take 5 mg by mouth daily.    Marland Kitchen buPROPion (WELLBUTRIN XL) 150 MG 24 hr tablet Take 1 tablet by mouth every morning.    . carvedilol (COREG) 6.25 MG tablet Take 1 tablet (6.25 mg total) by mouth 2 (two) times daily with a meal. 180 tablet 3  . clopidogrel (PLAVIX) 75 MG tablet Take 1 tablet (75 mg total) by mouth at bedtime. 90 tablet 3  . colchicine 0.6 MG tablet Take 0.6 mg by mouth daily.     . cyclobenzaprine (FLEXERIL) 5 MG tablet Take 5 mg by mouth 3 (three) times daily as needed for muscle spasms.    Marland Kitchen ELIQUIS 5 MG TABS tablet TAKE 1 TABLET BY MOUTH TWICE A DAY 60 tablet 5  . escitalopram (LEXAPRO) 10 MG tablet Take 10 mg by mouth 2 (two) times daily.    . famotidine (PEPCID) 20 MG tablet Take 20 mg by mouth 2 (two) times daily.    Marland Kitchen gabapentin (NEURONTIN) 300 MG capsule Take 600 mg by mouth at bedtime.    . nitroGLYCERIN (NITROSTAT) 0.4 MG SL tablet Place 1 tablet (0.4 mg total) under the tongue every 5 (five) minutes as needed for chest pain. 25 tablet 5  . oxyCODONE-acetaminophen (PERCOCET) 10-325 MG tablet Take 1 tablet by mouth 4 (four) times daily as needed for pain.    Marland Kitchen PREVIDENT 5000 BOOSTER PLUS 1.1 % PSTE See admin instructions.    . rosuvastatin (CRESTOR) 40 MG tablet TAKE 1 TABLET BY MOUTH EVERY DAY 90 tablet 2  . spironolactone (ALDACTONE) 25 MG tablet TAKE 1 TABLET (25 MG TOTAL) BY MOUTH DAILY AT NIGHT ONLY 90 tablet 3  . traZODone (DESYREL) 50 MG tablet Take 50 mg by mouth at bedtime as needed for sleep.    Marland Kitchen triamcinolone cream (KENALOG) 0.1 % Apply 1 application topically as needed for rash.     No current facility-administered medications for this visit.    Allergies:   Tramadol   Social History: Social History   Socioeconomic History  . Marital status: Married    Spouse name: Not on file  . Number of children: 1  . Years of education: HS +  . Highest education level: Not on file  Occupational History  . Occupation: retired   Tobacco Use  . Smoking status: Former Smoker    Types: Pipe    Quit date: 09/19/1999    Years since quitting: 21.5  . Smokeless tobacco: Never Used  . Tobacco comment: quit in 1998  Vaping Use  . Vaping Use: Never used  Substance and Sexual Activity  . Alcohol use: No    Alcohol/week: 0.0 standard drinks    Comment: recovering alcoholic, quit 38SNK ago (2000)  . Drug use: No  . Sexual activity: Not on file  Other Topics Concern  . Not on file  Social History Narrative   Lives at home w/ his wife.   Patient is right handed.   Patient drinks about 3 cups of soda daily.   Social Determinants of Health   Financial Resource Strain:  Not on file  Food Insecurity: Not on file  Transportation Needs: Not on file  Physical Activity: Not on file  Stress: Not on file  Social Connections: Not on file  Intimate Partner Violence: Not on file    Family History: Family History  Problem Relation Age of Onset  . Heart disease Mother   . Heart attack Mother   . Heart disease Father   . Heart attack Father   . Heart disease Brother   . Heart attack Brother     Review of Systems: All other systems reviewed and are otherwise negative except as noted above.   Physical Exam: Vitals:   03/26/21 1209  BP: 132/80  Pulse: 68  SpO2: 98%  Weight: 161 lb (73 kg)  Height: 5\' 10"  (1.778 m)     GEN- The patient is well appearing, alert and oriented x 3 today.   HEENT: normocephalic, atraumatic; sclera clear, conjunctiva pink; hearing intact; oropharynx clear; neck supple, no JVP Lymph- no cervical lymphadenopathy Lungs- Clear to ausculation bilaterally, normal work of breathing.  No wheezes, rales, rhonchi Heart- Regular rate and rhythm, no murmurs, rubs or gallops, PMI not laterally displaced GI- soft, non-tender, non-distended, bowel sounds present, no hepatosplenomegaly Extremities- no clubbing or cyanosis. No edema; DP/PT/radial pulses 2+ bilaterally MS- no significant deformity or  atrophy Skin- warm and dry, no rash or lesion; ICD pocket well healed Psych- euthymic mood, full affect Neuro- strength and sensation are intact  ICD interrogation- reviewed in detail today,  See PACEART report  EKG:  EKG is not ordered today.  Recent Labs: 12/22/2020: ALT 19; BUN 35; Creatinine, Ser 2.16; Hemoglobin 12.8; Platelets 238; Potassium 4.7; Sodium 139   Wt Readings from Last 3 Encounters:  03/26/21 161 lb (73 kg)  01/16/21 163 lb 1.6 oz (74 kg)  12/22/20 165 lb (74.8 kg)     Other studies Reviewed: Additional studies/ records that were reviewed today include: Previous EP office notes   Assessment and Plan:  1.  Chronic systolic dysfunction s/p [employer] dual chamber ICD  euvolemic today Stable on an appropriate medical regimen Normal ICD function See Pace Art report No changes today  2. CAD Denies anginal symptoms Continue ASA/plavix, BB, statin, nitrate.       3. PVCs No episodes today.   4. PAF On eliquis 5 mg BID for CHA2DS2VASC of at least 5.    5. Secondary Hypercoagulable State (ICD10:  D68.69) The patient is at high risk of stroke/thromboembolism with CHA2DS2VASC of at least 5 as above.   He may need dose adjustment of his Eliquis when he turns 80 based on his renal function. Will check today.    6. Device pocket pain Stable. No change to site by exam today. (First time personally examining this patient, but stable per patient and recorded history)   7.  Orthostatic history Chronic Again stressed importance of hydration and possibility of trying back brace and or thigh sleeves.     8. HTN OK on current regimen. Will not be overly aggressive with orthostasis as well.   Current medicines are reviewed at length with the patient today.   The patient does not have concerns regarding his medicines.  The following changes were made today:  none  Labs/ tests ordered today include:  Orders Placed This Encounter  Procedures  . Basic  metabolic panel  . CBC  . CUP PACEART INCLINIC DEVICE CHECK   Disposition:   Follow up with Dr. Caryl Comes  6 months  Jacalyn Lefevre, PA-C  03/26/2021 1:29 PM  [address] [place] [address] [phone] (office) [phone] (fax)

## 2021-03-27 DIAGNOSIS — M19022 Primary osteoarthritis, left elbow: Secondary | ICD-10-CM | POA: Diagnosis not present

## 2021-03-27 DIAGNOSIS — G5622 Lesion of ulnar nerve, left upper limb: Secondary | ICD-10-CM | POA: Diagnosis not present

## 2021-04-03 DIAGNOSIS — Z6823 Body mass index (BMI) 23.0-23.9, adult: Secondary | ICD-10-CM | POA: Diagnosis not present

## 2021-04-03 DIAGNOSIS — M19022 Primary osteoarthritis, left elbow: Secondary | ICD-10-CM | POA: Diagnosis not present

## 2021-04-03 DIAGNOSIS — I10 Essential (primary) hypertension: Secondary | ICD-10-CM | POA: Diagnosis not present

## 2021-04-03 DIAGNOSIS — F32A Depression, unspecified: Secondary | ICD-10-CM | POA: Diagnosis not present

## 2021-04-03 DIAGNOSIS — R42 Dizziness and giddiness: Secondary | ICD-10-CM | POA: Diagnosis not present

## 2021-04-05 ENCOUNTER — Ambulatory Visit (INDEPENDENT_AMBULATORY_CARE_PROVIDER_SITE_OTHER): Payer: Medicare Other

## 2021-04-05 DIAGNOSIS — I255 Ischemic cardiomyopathy: Secondary | ICD-10-CM | POA: Diagnosis not present

## 2021-04-05 LAB — CUP PACEART REMOTE DEVICE CHECK
Battery Remaining Longevity: 48 mo
Battery Remaining Percentage: 58 %
Brady Statistic RA Percent Paced: 10 %
Brady Statistic RV Percent Paced: 0 %
Date Time Interrogation Session: 20220603004100
HighPow Impedance: 50 Ohm
Implantable Lead Implant Date: 20060512
Implantable Lead Implant Date: 20060512
Implantable Lead Location: 753859
Implantable Lead Location: 753860
Implantable Lead Model: 158
Implantable Lead Model: 5076
Implantable Lead Serial Number: 159477
Implantable Pulse Generator Implant Date: 20150123
Lead Channel Impedance Value: 410 Ohm
Lead Channel Impedance Value: 558 Ohm
Lead Channel Pacing Threshold Amplitude: 0.6 V
Lead Channel Pacing Threshold Amplitude: 0.9 V
Lead Channel Pacing Threshold Pulse Width: 0.4 ms
Lead Channel Pacing Threshold Pulse Width: 0.4 ms
Lead Channel Setting Pacing Amplitude: 2 V
Lead Channel Setting Pacing Amplitude: 2.4 V
Lead Channel Setting Pacing Pulse Width: 0.4 ms
Lead Channel Setting Sensing Sensitivity: 0.6 mV
Pulse Gen Serial Number: 112776

## 2021-04-23 NOTE — Progress Notes (Signed)
Remote ICD transmission.   

## 2021-05-03 ENCOUNTER — Other Ambulatory Visit: Payer: Self-pay | Admitting: Physician Assistant

## 2021-05-07 NOTE — Telephone Encounter (Signed)
Dr. Caryl Comes is the main Cardiologist and Richardson Dopp is a PA. Can you please advise with Dr. Caryl Comes if pt is supposed to still be taking Entresto, if so, that pt can get their medication? Please advise thanks.

## 2021-05-07 NOTE — Telephone Encounter (Signed)
I don't see where Dr Marlou Porch has ever seen this pt.  Looks like Delene Loll was on pt's med list in 05/2020 but not there 10/21.  Unsure of whether pt should be taking or not.  I have found no documentation instructing pt to d/c it.  Will forward to [place] to review.

## 2021-05-07 NOTE — Telephone Encounter (Signed)
Pharmacy is requesting refill for this medication, is Dr. Marlou Porch ok with refilling this?

## 2021-05-10 NOTE — Telephone Encounter (Signed)
Spoke with pt and advised office has received a refill request on Entresto but this medication is not currently on his MAR who states he is no longer taking Entresto as Dr Caryl Comes stopped this medication "a while back"  Pt states he is doing well. Pt advised refill will be denied.  Pt verbalizes understanding and agrees with current plan.

## 2021-06-10 DIAGNOSIS — G5622 Lesion of ulnar nerve, left upper limb: Secondary | ICD-10-CM | POA: Diagnosis not present

## 2021-06-10 DIAGNOSIS — M19022 Primary osteoarthritis, left elbow: Secondary | ICD-10-CM | POA: Diagnosis not present

## 2021-06-24 DIAGNOSIS — C44622 Squamous cell carcinoma of skin of right upper limb, including shoulder: Secondary | ICD-10-CM | POA: Diagnosis not present

## 2021-06-24 DIAGNOSIS — L570 Actinic keratosis: Secondary | ICD-10-CM | POA: Diagnosis not present

## 2021-06-24 DIAGNOSIS — L578 Other skin changes due to chronic exposure to nonionizing radiation: Secondary | ICD-10-CM | POA: Diagnosis not present

## 2021-07-01 DIAGNOSIS — N183 Chronic kidney disease, stage 3 unspecified: Secondary | ICD-10-CM | POA: Diagnosis not present

## 2021-07-01 DIAGNOSIS — I2510 Atherosclerotic heart disease of native coronary artery without angina pectoris: Secondary | ICD-10-CM | POA: Diagnosis not present

## 2021-07-01 DIAGNOSIS — N2581 Secondary hyperparathyroidism of renal origin: Secondary | ICD-10-CM | POA: Diagnosis not present

## 2021-07-01 DIAGNOSIS — N1832 Chronic kidney disease, stage 3b: Secondary | ICD-10-CM | POA: Diagnosis not present

## 2021-07-01 DIAGNOSIS — I129 Hypertensive chronic kidney disease with stage 1 through stage 4 chronic kidney disease, or unspecified chronic kidney disease: Secondary | ICD-10-CM | POA: Diagnosis not present

## 2021-07-01 DIAGNOSIS — I5020 Unspecified systolic (congestive) heart failure: Secondary | ICD-10-CM | POA: Diagnosis not present

## 2021-07-05 ENCOUNTER — Ambulatory Visit (INDEPENDENT_AMBULATORY_CARE_PROVIDER_SITE_OTHER): Payer: Medicare Other

## 2021-07-05 DIAGNOSIS — I255 Ischemic cardiomyopathy: Secondary | ICD-10-CM | POA: Diagnosis not present

## 2021-07-09 LAB — CUP PACEART REMOTE DEVICE CHECK
Battery Remaining Longevity: 48 mo
Battery Remaining Percentage: 56 %
Brady Statistic RA Percent Paced: 45 %
Brady Statistic RV Percent Paced: 0 %
Date Time Interrogation Session: 20220902004000
HighPow Impedance: 52 Ohm
Implantable Lead Implant Date: 20060512
Implantable Lead Implant Date: 20060512
Implantable Lead Location: 753859
Implantable Lead Location: 753860
Implantable Lead Model: 158
Implantable Lead Model: 5076
Implantable Lead Serial Number: 159477
Implantable Pulse Generator Implant Date: 20150123
Lead Channel Impedance Value: 408 Ohm
Lead Channel Impedance Value: 631 Ohm
Lead Channel Pacing Threshold Amplitude: 0.6 V
Lead Channel Pacing Threshold Amplitude: 0.9 V
Lead Channel Pacing Threshold Pulse Width: 0.4 ms
Lead Channel Pacing Threshold Pulse Width: 0.4 ms
Lead Channel Setting Pacing Amplitude: 2 V
Lead Channel Setting Pacing Amplitude: 2.4 V
Lead Channel Setting Pacing Pulse Width: 0.4 ms
Lead Channel Setting Sensing Sensitivity: 0.6 mV
Pulse Gen Serial Number: 112776

## 2021-07-12 ENCOUNTER — Other Ambulatory Visit: Payer: Self-pay | Admitting: Internal Medicine

## 2021-07-17 NOTE — Progress Notes (Signed)
Remote ICD transmission.   

## 2021-08-10 ENCOUNTER — Other Ambulatory Visit: Payer: Self-pay | Admitting: Internal Medicine

## 2021-10-04 ENCOUNTER — Ambulatory Visit (INDEPENDENT_AMBULATORY_CARE_PROVIDER_SITE_OTHER): Payer: Medicare Other

## 2021-10-04 DIAGNOSIS — I255 Ischemic cardiomyopathy: Secondary | ICD-10-CM | POA: Diagnosis not present

## 2021-10-04 LAB — CUP PACEART REMOTE DEVICE CHECK
Battery Remaining Longevity: 42 mo
Battery Remaining Percentage: 53 %
Brady Statistic RA Percent Paced: 59 %
Brady Statistic RV Percent Paced: 7 %
Date Time Interrogation Session: 20221202004100
HighPow Impedance: 48 Ohm
Implantable Lead Implant Date: 20060512
Implantable Lead Implant Date: 20060512
Implantable Lead Location: 753859
Implantable Lead Location: 753860
Implantable Lead Model: 158
Implantable Lead Model: 5076
Implantable Lead Serial Number: 159477
Implantable Pulse Generator Implant Date: 20150123
Lead Channel Impedance Value: 403 Ohm
Lead Channel Impedance Value: 633 Ohm
Lead Channel Pacing Threshold Amplitude: 0.6 V
Lead Channel Pacing Threshold Amplitude: 0.9 V
Lead Channel Pacing Threshold Pulse Width: 0.4 ms
Lead Channel Pacing Threshold Pulse Width: 0.4 ms
Lead Channel Setting Pacing Amplitude: 2 V
Lead Channel Setting Pacing Amplitude: 2.4 V
Lead Channel Setting Pacing Pulse Width: 0.4 ms
Lead Channel Setting Sensing Sensitivity: 0.6 mV
Pulse Gen Serial Number: 112776

## 2021-10-07 ENCOUNTER — Ambulatory Visit (INDEPENDENT_AMBULATORY_CARE_PROVIDER_SITE_OTHER): Payer: Medicare Other | Admitting: Internal Medicine

## 2021-10-07 ENCOUNTER — Encounter: Payer: Self-pay | Admitting: Internal Medicine

## 2021-10-07 ENCOUNTER — Other Ambulatory Visit: Payer: Self-pay

## 2021-10-07 VITALS — BP 120/80 | HR 75 | Ht 70.0 in | Wt 166.0 lb

## 2021-10-07 DIAGNOSIS — I5022 Chronic systolic (congestive) heart failure: Secondary | ICD-10-CM

## 2021-10-07 DIAGNOSIS — Z79899 Other long term (current) drug therapy: Secondary | ICD-10-CM | POA: Diagnosis not present

## 2021-10-07 DIAGNOSIS — Z95810 Presence of automatic (implantable) cardiac defibrillator: Secondary | ICD-10-CM

## 2021-10-07 DIAGNOSIS — I480 Paroxysmal atrial fibrillation: Secondary | ICD-10-CM | POA: Diagnosis not present

## 2021-10-07 DIAGNOSIS — I255 Ischemic cardiomyopathy: Secondary | ICD-10-CM

## 2021-10-07 DIAGNOSIS — I951 Orthostatic hypotension: Secondary | ICD-10-CM | POA: Diagnosis not present

## 2021-10-07 LAB — CUP PACEART INCLINIC DEVICE CHECK
Date Time Interrogation Session: 20221205165304
HighPow Impedance: 51 Ohm
Implantable Lead Implant Date: 20060512
Implantable Lead Implant Date: 20060512
Implantable Lead Location: 753859
Implantable Lead Location: 753860
Implantable Lead Model: 158
Implantable Lead Model: 5076
Implantable Lead Serial Number: 159477
Implantable Pulse Generator Implant Date: 20150123
Lead Channel Impedance Value: 420 Ohm
Lead Channel Impedance Value: 705 Ohm
Lead Channel Pacing Threshold Amplitude: 0.7 V
Lead Channel Pacing Threshold Amplitude: 0.8 V
Lead Channel Pacing Threshold Pulse Width: 0.4 ms
Lead Channel Pacing Threshold Pulse Width: 0.4 ms
Lead Channel Sensing Intrinsic Amplitude: 1.7 mV
Lead Channel Sensing Intrinsic Amplitude: 11.4 mV
Lead Channel Setting Pacing Amplitude: 2 V
Lead Channel Setting Pacing Amplitude: 2.4 V
Lead Channel Setting Pacing Pulse Width: 0.4 ms
Lead Channel Setting Sensing Sensitivity: 0.6 mV
Pulse Gen Serial Number: 112776

## 2021-10-07 MED ORDER — ENTRESTO 24-26 MG PO TABS: 1.0000 | ORAL_TABLET | Freq: Two times a day (BID) | ORAL | 3 refills | Status: DC

## 2021-10-07 NOTE — Patient Instructions (Addendum)
Medication Instructions:  Your physician has recommended you make the following change in your medication:   ** STOP Amlodipine  ** Begin Entresto 24 -26mg  - 1 tablet by mouth twice daily. *If you need a refill on your cardiac medications before your next appointment, please call your pharmacy*   Lab Work: BMET 10/30/2021 - You may come anytime between 8am and 430pm.  You do not need to be fasting.  If you have labs (blood work) drawn today and your tests are completely normal, you will receive your results only by: [place] (if you have MyChart) OR A paper copy in the mail If you have any lab test that is abnormal or we need to change your treatment, we will call you to review the results.   Testing/Procedures: None ordered.    Follow-Up: At [facility], you and your health needs are our priority.  As part of our continuing mission to provide you with exceptional heart care, we have created designated Provider Care Teams.  These Care Teams include your primary Cardiologist (physician) and Advanced Practice Providers (APPs -  Physician Assistants and Nurse Practitioners) who all work together to provide you with the care you need, when you need it.  We recommend signing up for the patient portal called "MyChart".  Sign up information is provided on this After Visit Summary.  MyChart is used to connect with patients for Virtual Visits (Telemedicine).  Patients are able to view lab/test results, encounter notes, upcoming appointments, etc.  Non-urgent messages can be sent to your provider as well.   To learn more about what you can do with MyChart, go to [email or web address].    Your next appointment:   6 months with Dr Caryl Comes

## 2021-10-07 NOTE — Progress Notes (Signed)
Patient Care Team: Ernestene Kiel, MD as PCP - General (Internal Medicine) Deboraha Sprang, MD as PCP - Cardiology (Cardiology) Deboraha Sprang, MD as PCP - Electrophysiology (Cardiology) Deboraha Sprang, MD as Consulting Physician (Cardiology) Serafina Mitchell, MD as Consulting Physician (Vascular Surgery) Kathrynn Ducking, MD as Consulting Physician (Neurology)   HPI  Connor Wilkerson is a 80 y.o. male Seen following ICD generator replacement initally implanted 2006 for primary prevention in setting of ischemic cardiomyopathy.   subsequently  the device pocket moved to subpectorally.  He is now complaining of device tenderness over the last 6 months.  It is somewhat better over the last 3 months.  He is noted migration into his axilla.  Denies trauma.  No cellulitis of which he is aware.  He has hx of remote MI 1999, CABG  1999       The patient denies chest pain, shortness of breath, nocturnal dyspnea, orthopnea or peripheral edema.  There have been no palpitations; not had any issues with falls or syncope in the past year. Additionally, the swimmy headedness that was present previously has resolved.     He states that he has no complaints,  aside from  when he lies down he experiences some heartburn which passes after 2-3 seconds.        He is compliant with all medication with no notable side effects.       DATE TEST EF    11/16 Myoview  34 %    2/17 Cath   LM 30%; Native T; LIMA patent; SVG-PDA 70-80% anastomosis SVG-OM Patent  2/19  Echo  30-35%     3/19` LHC    LIMA p, SVG-dRCAp, SVG-OM2p,  Native T         Date Cr K Hgb  4/21 1.7 (GFR 40) 4.4 12.7   5/22 1.81<<2.1.6            Past Medical History     Past Medical History:  Diagnosis Date   Abnormality of gait 02/07/2015   AICD (automatic cardioverter/defibrillator) present    [employer]   CAD (coronary artery disease)    Hx of Inf MI treated with CABG in 1999 // Myoview  11/16: mild inf-lat ischemia, EF 34, high risk // LHC 3/19: 3/3 bypass grafts patent   Carotid artery disease ([place])    Followed by [facility] (Dr. Trula Slade) // [place] in 2/19: bilat ICA 71-69   Chronic systolic CHF (congestive heart failure) ([place]) 01/01/2010   Echo 12/30/2017: Mild LVH, EF 30-35, diffuse HK, grade 1 diastolic dysfunction, mildly reduced RVSF // Echo 04/12/2016:  EF 35-40, PASP 39, mild to moderate TR, mild LAE // Echo (12/14):  Base/mid inferior and inferolateral HK, EF 45%, mild LAE, mildly reduced RVSF   CKD (chronic kidney disease)    Depression    HLP (hyperkeratosis lenticularis perstans)    HTN (hypertension)    ICD (implantable cardiac defibrillator)  BSx    [employer]   Ischemic cardiomyopathy    Myocardial infarction ([place]) 1999   Near syncope 09/21/2014   Orthostatic hypotension    PAF (paroxysmal atrial fibrillation) ([facility])    Presence of permanent cardiac pacemaker    Wears partial dentures    upper    Past Surgical History:  Procedure Laterality Date   CARDIAC CATHETERIZATION N/A 12/11/2015   Procedure: Left Heart Cath and Coronary Angiography;  Surgeon: Charolette Forward, MD;  Location: [facility];  Service: Cardiovascular;  Laterality: N/A;   CARDIAC DEFIBRILLATOR PLACEMENT     [employer]   CARDIAC DEFIBRILLATOR PLACEMENT  04/08/2016   NOT MRI SAFE (DOCUMENT SCANNED IN SYSTEM   COLONOSCOPY  10/06/2007   Mild sigmoid diverticulosis. Small internal hemorrhoids.    CORONARY ARTERY BYPASS GRAFT     ELBOW SURGERY Left    plastic bone replacement   HERNIA REPAIR     x2   LEFT HEART CATH AND CORS/GRAFTS ANGIOGRAPHY N/A 01/07/2018   Procedure: LEFT HEART CATH AND CORS/GRAFTS ANGIOGRAPHY;  Surgeon: Burnell Blanks, MD;  Location: [facility];  Service: Cardiovascular;  Laterality: N/A;   PERMANENT PACEMAKER GENERATOR CHANGE N/A 11/25/2013   Procedure: PERMANENT PACEMAKER GENERATOR CHANGE;  Surgeon: Deboraha Sprang, MD;  Location: [facility];   Service: Cardiovascular;  Laterality: N/A;    ROTATOR CUFF REPAIR Right 2006   SINUS EXPLORATION  1987   ULNAR NERVE TRANSPOSITION Left 07/19/2019   Procedure: Ulnar nerve release at the elbow with anterior transposition and flexor pronator lengthening, anterior interosseous nerve transfer to the deep motor branch of the ulnar nerve at the forearm, flexor digitorum profundus tendon transfer of the ring and small to middle fingers, ulnar nerve release at the wrist, Guyons canal;  Surgeon: Roseanne Kaufman, MD;  Location: [facility];  Service: Orthoped   ULTRASOUND GUIDANCE FOR VASCULAR ACCESS  01/07/2018   Procedure: Ultrasound Guidance For Vascular Access;  Surgeon: Burnell Blanks, MD;  Location: [facility];  Service: Cardiovascular;;    Current Outpatient Medications  Medication Sig Dispense Refill   amLODipine (NORVASC) 5 MG tablet TAKE 1 TABLET BY MOUTH EVERY DAY 90 tablet 2   buPROPion (WELLBUTRIN XL) 150 MG 24 hr tablet Take 1 tablet by mouth every morning.     carvedilol (COREG) 6.25 MG tablet Take 1 tablet (6.25 mg total) by mouth 2 (two) times daily with a meal. 180 tablet 3   clopidogrel (PLAVIX) 75 MG tablet Take 1 tablet (75 mg total) by mouth at bedtime. 90 tablet 3   colchicine 0.6 MG tablet Take 0.6 mg by mouth daily.      cyclobenzaprine (FLEXERIL) 5 MG tablet Take 5 mg by mouth 3 (three) times daily as needed for muscle spasms.     ELIQUIS 5 MG TABS tablet TAKE 1 TABLET BY MOUTH TWICE A DAY 60 tablet 5   escitalopram (LEXAPRO) 10 MG tablet Take 10 mg by mouth 2 (two) times daily.     famotidine (PEPCID) 20 MG tablet Take 20 mg by mouth 2 (two) times daily.     gabapentin (NEURONTIN) 300 MG capsule Take 600 mg by mouth at bedtime.     nitroGLYCERIN (NITROSTAT) 0.4 MG SL tablet Place 1 tablet (0.4 mg total) under the tongue every 5 (five) minutes as needed for chest pain. 25 tablet 5   oxyCODONE-acetaminophen (PERCOCET) 10-325 MG tablet Take 1 tablet by mouth 4 (four)  times daily as needed for pain.     PREVIDENT 5000 BOOSTER PLUS 1.1 % PSTE See admin instructions.     rosuvastatin (CRESTOR) 40 MG tablet TAKE 1 TABLET BY MOUTH EVERY DAY 90 tablet 2   spironolactone (ALDACTONE) 25 MG tablet TAKE 1 TABLET (25 MG TOTAL) BY MOUTH DAILY AT NIGHT ONLY 90 tablet 3   traZODone (DESYREL) 50 MG tablet Take 50 mg by mouth at bedtime as needed for sleep.     triamcinolone cream (KENALOG) 0.1 % Apply 1 application topically as needed  for rash.     No current facility-administered medications for this visit.    Allergies  Allergen Reactions   Tramadol Other (See Comments)    Unknown     Review of Systems negative except from HPI and PMH  Physical Exam BP 120/80 (BP Location: Left Arm, Patient Position: Sitting, Cuff Size: Normal)   Pulse 75   Ht 5\' 10"  (1.778 m)   Wt 166 lb (75.3 kg)   SpO2 95%   BMI 23.82 kg/m  Well developed and well nourished in no acute distress HENT normal Neck supple with JVP-flat Clear Device pocket well healed; without hematoma or erythema.  There is no tethering  Regular rate and rhythm, no  gallop No  murmur Abd-soft with active BS No Clubbing cyanosis  edema Skin-warm and dry A & Oriented  Grossly normal sensory and motor function  ECG sinus rhythm at 80 with intermittent sinus arrest with atrial pacing at 60 Intervals 18/09/38  Assessment and  Plan\  Ischemic cardiomyopathy  Implantable defibrillator     Hypertension-orthostatic hypotension-new  PVC  Device pocket pain-new  Renal insufficiency grade 3  Congestive heart failure-chronic-systolic  No recurrent syncope.  Blood pressures are well controlled.  With his left ventricular dysfunction, I would like to try getting back on Entresto, it looks like the prescription expired.  We will discontinue amlodipine and start him on Entresto 24/26.  We will have to keep a close eye on his renal function so we will recheck metabolic profile in 3 months.  No symptoms  of ischemia.  Continue rosuvastatin 40, carvedilol 6.25 and Plavix 75  No interval atrial fibrillation looking back to 2017 except for 1.6 hours in 2020.  Discussed with the patient discontinuing Eliquis; he would like to continue.    De Burrs Kelly,acting as a scribe for Virl Axe, MD.,have documented all relevant documentation on the behalf of Virl Axe, MD,as directed by  Virl Axe, MD while in the presence of Virl Axe, MD. I, Virl Axe, MD, have reviewed all documentation for this visit. The documentation on 10/07/21 for the exam, diagnosis, procedures, and orders are all accurate and complete.

## 2021-10-07 NOTE — Progress Notes (Deleted)
Patient Care Team: Ernestene Kiel, MD as PCP - General (Internal Medicine) Deboraha Sprang, MD as PCP - Cardiology (Cardiology) Deboraha Sprang, MD as PCP - Electrophysiology (Cardiology) Deboraha Sprang, MD as Consulting Physician (Cardiology) Serafina Mitchell, MD as Consulting Physician (Vascular Surgery) Kathrynn Ducking, MD as Consulting Physician (Neurology)   HPI  Connor Wilkerson is a 80 y.o. male Seen following ICD generator replacement initally implanted 2006 for primary prevention in setting of ischemic cardiomyopathy.   subsequently  the device pocket moved to subpectorally.  He is now complaining of device tenderness over the last 6 months.  It is somewhat better over the last 3 months.  He is noted migration into his axilla.  Denies trauma.  No cellulitis of which he is aware.  He has hx of remote MI 1999, CABG  1999   no exertional chest discomfort.  Mild dyspnea.   lightheadness is better-- but still with syncope every 3-6 weeks; mostly without warning.  Always while standing  The patient denies chest pain***, shortness of breath***, nocturnal dyspnea***, orthopnea*** or peripheral edema***.  There have been no palpitations***, lightheadedness*** or syncope***.  Complains of ***.      DATE TEST EF    11/16 Myoview  34 %    2/17 Cath   LM 30%; Native T; LIMA patent; SVG-PDA 70-80% anastomosis SVG-OM Patent  2/19  Echo  30-35%     3/19` LHC    LIMA p, SVG-dRCAp, SVG-OM2p,  Native T         Date Cr K Hgb  4/21 1.7 (GFR 40) 4.4 12.7   5/22 1.81<<2.1.6            Past Medical History     Past Medical History:  Diagnosis Date   Abnormality of gait 02/07/2015   AICD (automatic cardioverter/defibrillator) present    [address]   CAD (coronary artery disease)    Hx of Inf MI treated with CABG in 1999 // Myoview 11/16: mild inf-lat ischemia, EF 34, high risk // LHC 3/19: 3/3 bypass grafts patent   Carotid artery disease ([place])     Followed by [facility] (Dr. Trula Slade) // [place] in 2/19: bilat ICA 57-84   Chronic systolic CHF (congestive heart failure) ([place]) 01/01/2010   Echo 12/30/2017: Mild LVH, EF 30-35, diffuse HK, grade 1 diastolic dysfunction, mildly reduced RVSF // Echo 04/12/2016:  EF 35-40, PASP 39, mild to moderate TR, mild LAE // Echo (12/14):  Base/mid inferior and inferolateral HK, EF 45%, mild LAE, mildly reduced RVSF   CKD (chronic kidney disease)    Depression    HLP (hyperkeratosis lenticularis perstans)    HTN (hypertension)    ICD (implantable cardiac defibrillator)  BSx    [employer]   Ischemic cardiomyopathy    Myocardial infarction ([place]) 1999   Near syncope 09/21/2014   Orthostatic hypotension    PAF (paroxysmal atrial fibrillation) (HCC)    Presence of permanent cardiac pacemaker    Wears partial dentures    upper    Past Surgical History:  Procedure Laterality Date   CARDIAC CATHETERIZATION N/A 12/11/2015   Procedure: Left Heart Cath and Coronary Angiography;  Surgeon: Charolette Forward, MD;  Location: [facility];  Service: Cardiovascular;  Laterality: N/A;   CARDIAC DEFIBRILLATOR PLACEMENT     [employer]   CARDIAC DEFIBRILLATOR PLACEMENT  04/08/2016   NOT MRI SAFE (DOCUMENT SCANNED IN SYSTEM   COLONOSCOPY  10/06/2007  Mild sigmoid diverticulosis. Small internal hemorrhoids.    CORONARY ARTERY BYPASS GRAFT     ELBOW SURGERY Left    plastic bone replacement   HERNIA REPAIR     x2   LEFT HEART CATH AND CORS/GRAFTS ANGIOGRAPHY N/A 01/07/2018   Procedure: LEFT HEART CATH AND CORS/GRAFTS ANGIOGRAPHY;  Surgeon: Burnell Blanks, MD;  Location: [facility];  Service: Cardiovascular;  Laterality: N/A;   PERMANENT PACEMAKER GENERATOR CHANGE N/A 11/25/2013   Procedure: PERMANENT PACEMAKER GENERATOR CHANGE;  Surgeon: Deboraha Sprang, MD;  Location: [facility];  Service: Cardiovascular;  Laterality: N/A;    ROTATOR CUFF REPAIR Right 2006   SINUS EXPLORATION  1987   ULNAR NERVE  TRANSPOSITION Left 07/19/2019   Procedure: Ulnar nerve release at the elbow with anterior transposition and flexor pronator lengthening, anterior interosseous nerve transfer to the deep motor branch of the ulnar nerve at the forearm, flexor digitorum profundus tendon transfer of the ring and small to middle fingers, ulnar nerve release at the wrist, Guyons canal;  Surgeon: Roseanne Kaufman, MD;  Location: [place];  Service: Orthoped   ULTRASOUND GUIDANCE FOR VASCULAR ACCESS  01/07/2018   Procedure: Ultrasound Guidance For Vascular Access;  Surgeon: Burnell Blanks, MD;  Location: [place] [facility];  Service: Cardiovascular;;    Current Outpatient Medications  Medication Sig Dispense Refill   amLODipine (NORVASC) 5 MG tablet TAKE 1 TABLET BY MOUTH EVERY DAY 90 tablet 2   buPROPion (WELLBUTRIN XL) 150 MG 24 hr tablet Take 1 tablet by mouth every morning.     carvedilol (COREG) 6.25 MG tablet Take 1 tablet (6.25 mg total) by mouth 2 (two) times daily with a meal. 180 tablet 3   clopidogrel (PLAVIX) 75 MG tablet Take 1 tablet (75 mg total) by mouth at bedtime. 90 tablet 3   colchicine 0.6 MG tablet Take 0.6 mg by mouth daily.      cyclobenzaprine (FLEXERIL) 5 MG tablet Take 5 mg by mouth 3 (three) times daily as needed for muscle spasms.     ELIQUIS 5 MG TABS tablet TAKE 1 TABLET BY MOUTH TWICE A DAY 60 tablet 5   escitalopram (LEXAPRO) 10 MG tablet Take 10 mg by mouth 2 (two) times daily.     famotidine (PEPCID) 20 MG tablet Take 20 mg by mouth 2 (two) times daily.     gabapentin (NEURONTIN) 300 MG capsule Take 600 mg by mouth at bedtime.     nitroGLYCERIN (NITROSTAT) 0.4 MG SL tablet Place 1 tablet (0.4 mg total) under the tongue every 5 (five) minutes as needed for chest pain. 25 tablet 5   oxyCODONE-acetaminophen (PERCOCET) 10-325 MG tablet Take 1 tablet by mouth 4 (four) times daily as needed for pain.     PREVIDENT 5000 BOOSTER PLUS 1.1 % PSTE See admin instructions.     rosuvastatin  (CRESTOR) 40 MG tablet TAKE 1 TABLET BY MOUTH EVERY DAY 90 tablet 2   spironolactone (ALDACTONE) 25 MG tablet TAKE 1 TABLET (25 MG TOTAL) BY MOUTH DAILY AT NIGHT ONLY 90 tablet 3   traZODone (DESYREL) 50 MG tablet Take 50 mg by mouth at bedtime as needed for sleep.     triamcinolone cream (KENALOG) 0.1 % Apply 1 application topically as needed for rash.     No current facility-administered medications for this visit.    Allergies  Allergen Reactions   Tramadol Other (See Comments)    Unknown     Review of Systems negative except  from HPI and PMH  Physical Exam BP 120/80 (BP Location: Left Arm, Patient Position: Sitting, Cuff Size: Normal)   Pulse 75   Ht 5\' 10"  (1.778 m)   Wt 166 lb (75.3 kg)   SpO2 95%   BMI 23.82 kg/m  Well developed and well nourished in no acute distress HENT normal Neck supple with JVP-flat Clear Device pocket well healed; without hematoma or erythema.  There is no tethering  Regular rate and rhythm, no *** gallop No ***/*** murmur Abd-soft with active BS No Clubbing cyanosis *** edema Skin-warm and dry A & Oriented  Grossly normal sensory and motor function  ECG ***   Assessment and  Plan\  Ischemic cardiomyopathy  Implantable defibrillator     Hypertension-orthostatic hypotension-new  PVC  Device pocket pain-new  Renal insufficiency grade 3  Congestive heart failure-chronic-systolic        Without symptoms of ischemia  Euvolemic continue current meds  Recurrent falls without objective evidence of orthostasis today, but nothing on his ICD either, suspect vasomotor  We discussed the physiology of orthostatic intolerance including gravitational fluid shifts and the impact of hypertensive vascular disease on orthostasis and treatment options.  We discussed pharmacological options including midodrine, pyridostigmine, fludrocortisone.  We discussed nonpharmacological options including raising the HOB, isometric contraction upon  standing, abdominal binders  thigh sleeves. We emphasized the importance of recognizing the prodrome and sitting prior to falling, safety in the shower and in the bathroom and the avoidance of dehydration   We will begin with a nonpharmacological approach with using his back brace  Device pocket still with some discomfort, but no objective signs--will follow   Euvolemic continue current meds .

## 2021-10-08 ENCOUNTER — Telehealth: Payer: Self-pay

## 2021-10-08 NOTE — Telephone Encounter (Signed)
**Note De-Identified Ethan Clayburn Obfuscation** Entresto PA started through covermymeds. Key: [id number]

## 2021-10-09 NOTE — Telephone Encounter (Signed)
Information printed and placed with medical records for fax and scan.

## 2021-10-09 NOTE — Telephone Encounter (Signed)
**Note De-Identified Zelma Snead Obfuscation** Denial letter received Dorinda Stehr fax from [facility] stating that they denied the pt Entresto PA because we did not provide notes/test results proving the pts need for Entresto.  I have printed the pts last office visit notes from 10/07/2021 that include his Echo and Cath results, wrote an appeal letter and e-mailed all to Dr Aquilla Hacker nurse so she can fax all to [facility] appeal Dept at the fax number written on the cover letter included or to place in the to be faxed basket in Medical Records to be faxed.

## 2021-10-15 NOTE — Progress Notes (Signed)
Remote ICD transmission.   

## 2021-10-18 NOTE — Telephone Encounter (Signed)
Received fax from [facility] stating that Connor Wilkerson has been approved 10/16/21-10/16/22  Case ID: [id number]

## 2021-10-30 ENCOUNTER — Other Ambulatory Visit: Payer: Medicare Other

## 2021-11-06 ENCOUNTER — Other Ambulatory Visit: Payer: Medicare Other

## 2022-01-03 ENCOUNTER — Ambulatory Visit (INDEPENDENT_AMBULATORY_CARE_PROVIDER_SITE_OTHER): Payer: Medicare PPO

## 2022-01-03 DIAGNOSIS — I255 Ischemic cardiomyopathy: Secondary | ICD-10-CM

## 2022-01-03 LAB — CUP PACEART REMOTE DEVICE CHECK
Battery Remaining Longevity: 42 mo
Battery Remaining Percentage: 47 %
Brady Statistic RA Percent Paced: 57 %
Brady Statistic RV Percent Paced: 30 %
Date Time Interrogation Session: 20230303004100
HighPow Impedance: 52 Ohm
Implantable Lead Implant Date: 20060512
Implantable Lead Implant Date: 20060512
Implantable Lead Location: 753859
Implantable Lead Location: 753860
Implantable Lead Model: 158
Implantable Lead Model: 5076
Implantable Lead Serial Number: 159477
Implantable Pulse Generator Implant Date: 20150123
Lead Channel Impedance Value: 400 Ohm
Lead Channel Impedance Value: 680 Ohm
Lead Channel Pacing Threshold Amplitude: 0.7 V
Lead Channel Pacing Threshold Amplitude: 0.8 V
Lead Channel Pacing Threshold Pulse Width: 0.4 ms
Lead Channel Pacing Threshold Pulse Width: 0.4 ms
Lead Channel Setting Pacing Amplitude: 2 V
Lead Channel Setting Pacing Amplitude: 2.4 V
Lead Channel Setting Pacing Pulse Width: 0.4 ms
Lead Channel Setting Sensing Sensitivity: 0.6 mV
Pulse Gen Serial Number: 112776

## 2022-01-13 NOTE — Progress Notes (Signed)
Remote ICD transmission.   

## 2022-03-10 ENCOUNTER — Other Ambulatory Visit (HOSPITAL_COMMUNITY): Payer: Self-pay | Admitting: Internal Medicine

## 2022-03-10 DIAGNOSIS — I480 Paroxysmal atrial fibrillation: Secondary | ICD-10-CM

## 2022-03-10 MED ORDER — APIXABAN 2.5 MG PO TABS: 2.5000 mg | ORAL_TABLET | Freq: Two times a day (BID) | ORAL | 5 refills | Status: DC

## 2022-03-10 NOTE — Telephone Encounter (Addendum)
Eliquis '5mg'$  refill request received. Patient is 81 years old, weight-75.3kg, Crea-1.68 on 02/25/2022 via [place] from [place], [place], and last seen by Dr. Caryl Comes on 10/07/2021 and pending appt with Dr. Caryl Comes on 04/10/2022.  ? ?Dose needs to be evaluated-crea>1.5 & age >80yr old.  ? ?Discussed dose with Melissa, pharmacist and approved pt to start reduced dose of Eliquis 2.'5mg'$  twice a day. Will update pt and send in correct dose.  ? ?Spoke with [place], who is [place] and advised that the dose of Eliquis '5mg'$  twice a day will be reduced to 2.'5mg'$  twice a day. She had the Eliquis bottle and stated he has some and advised to have pt to take the last '5mg'$  tablet tonight and pick up the Eliquis 2.'5mg'$  to start tomorrow and take twice a day. She verbalized understanding.  ?Also, went ovr the cardiac meds listed the pt should be taken per medlist. Wife will call in refills for those needed.  ?

## 2022-03-13 ENCOUNTER — Other Ambulatory Visit: Payer: Self-pay

## 2022-03-13 MED ORDER — ROSUVASTATIN CALCIUM 40 MG PO TABS: 40.0000 mg | ORAL_TABLET | Freq: Every day | ORAL | 2 refills | Status: DC

## 2022-03-20 ENCOUNTER — Other Ambulatory Visit: Payer: Self-pay | Admitting: Internal Medicine

## 2022-04-04 ENCOUNTER — Ambulatory Visit (INDEPENDENT_AMBULATORY_CARE_PROVIDER_SITE_OTHER): Payer: Medicare PPO

## 2022-04-04 DIAGNOSIS — I255 Ischemic cardiomyopathy: Secondary | ICD-10-CM | POA: Diagnosis not present

## 2022-04-04 DIAGNOSIS — I493 Ventricular premature depolarization: Secondary | ICD-10-CM | POA: Insufficient documentation

## 2022-04-04 LAB — CUP PACEART REMOTE DEVICE CHECK
Battery Remaining Longevity: 36 mo
Battery Remaining Percentage: 46 %
Brady Statistic RA Percent Paced: 47 %
Brady Statistic RV Percent Paced: 24 %
Date Time Interrogation Session: 20230602004100
HighPow Impedance: 45 Ohm
Implantable Lead Implant Date: 20060512
Implantable Lead Implant Date: 20060512
Implantable Lead Location: 753859
Implantable Lead Location: 753860
Implantable Lead Model: 158
Implantable Lead Model: 5076
Implantable Lead Serial Number: 159477
Implantable Pulse Generator Implant Date: 20150123
Lead Channel Impedance Value: 386 Ohm
Lead Channel Impedance Value: 583 Ohm
Lead Channel Pacing Threshold Amplitude: 0.7 V
Lead Channel Pacing Threshold Amplitude: 0.8 V
Lead Channel Pacing Threshold Pulse Width: 0.4 ms
Lead Channel Pacing Threshold Pulse Width: 0.4 ms
Lead Channel Setting Pacing Amplitude: 2 V
Lead Channel Setting Pacing Amplitude: 2.4 V
Lead Channel Setting Pacing Pulse Width: 0.4 ms
Lead Channel Setting Sensing Sensitivity: 0.6 mV
Pulse Gen Serial Number: 112776

## 2022-04-10 ENCOUNTER — Ambulatory Visit (INDEPENDENT_AMBULATORY_CARE_PROVIDER_SITE_OTHER): Payer: Medicare PPO | Admitting: Internal Medicine

## 2022-04-10 ENCOUNTER — Encounter: Payer: Self-pay | Admitting: Internal Medicine

## 2022-04-10 VITALS — BP 135/81 | HR 65 | Ht 70.0 in | Wt 165.6 lb

## 2022-04-10 DIAGNOSIS — I5022 Chronic systolic (congestive) heart failure: Secondary | ICD-10-CM

## 2022-04-10 DIAGNOSIS — I493 Ventricular premature depolarization: Secondary | ICD-10-CM

## 2022-04-10 DIAGNOSIS — Z95810 Presence of automatic (implantable) cardiac defibrillator: Secondary | ICD-10-CM | POA: Diagnosis not present

## 2022-04-10 DIAGNOSIS — I951 Orthostatic hypotension: Secondary | ICD-10-CM | POA: Diagnosis not present

## 2022-04-10 DIAGNOSIS — I255 Ischemic cardiomyopathy: Secondary | ICD-10-CM

## 2022-04-10 MED ORDER — ENTRESTO 24-26 MG PO TABS: 0.5000 | ORAL_TABLET | Freq: Two times a day (BID) | ORAL | 3 refills | Status: DC

## 2022-04-10 NOTE — Progress Notes (Signed)
I am     Patient Care Team: Ernestene Kiel, MD as PCP - General (Internal Medicine) Deboraha Sprang, MD as PCP - Cardiology (Cardiology) Deboraha Sprang, MD as PCP - Electrophysiology (Cardiology) Deboraha Sprang, MD as Consulting Physician (Cardiology) Serafina Mitchell, MD as Consulting Physician (Vascular Surgery) Kathrynn Ducking, MD (Inactive) as Consulting Physician (Neurology)   HPI  Connor Wilkerson is a 81 y.o. male Seen following ICD generator replacement initally implanted 2006 for primary prevention in setting of ischemic cardiomyopathy.   subsequently  the device pocket moved to subpectorally.  He is now complaining of device tenderness over the last 6 months.  It is somewhat better over the last 3 months.  He is noted migration into his axilla.  Denies trauma.  No cellulitis of which he is aware.  He has hx of remote MI 1999, CABG  1999   no exertional chest discomfort.  Mild dyspnea.   Patient continues to have episodes of lightheadedness, but no syncope.  This is an improvement.  Chest pain that lasted only seconds.  Chronic dyspnea.  Stable.  No nocturnal dyspnea orthopnea or peripheral edema.  No palpitations or ICD shocks.  Atrial fibrillation on apixaban without bleeding   DATE TEST EF    11/16 Myoview  34 %    2/17 Cath   LM 30%; Native T; LIMA patent; SVG-PDA 70-80% anastomosis SVG-OM Patent  2/19  Echo  30-35%     3/19` LHC    LIMA p, SVG-dRCAp, SVG-OM2p,  Native T         Date Cr K Hgb  4/21 1.7 (GFR 40) 4.4 12.7   4/23 1.68 5.0 99.8   Thromboembolic risk factors ( age  -2, HTN-1, Vasc disease -1, CHF-1 ) for a CHADSVASc Score of >=5   Past Medical History:  Diagnosis Date   Abnormality of gait 02/07/2015   AICD (automatic cardioverter/defibrillator) present    [place]   CAD (coronary artery disease)    Hx of Inf MI treated with CABG in 1999 // Myoview 11/16: mild inf-lat ischemia, EF 34, high risk // LHC 3/19: 3/3 bypass  grafts patent   Carotid artery disease ([place])    Followed by [facility] (Dr. Trula Slade) // [place] in 2/19: bilat ICA 33-82   Chronic systolic CHF (congestive heart failure) ([place]) 01/01/2010   Echo 12/30/2017: Mild LVH, EF 30-35, diffuse HK, grade 1 diastolic dysfunction, mildly reduced RVSF // Echo 04/12/2016:  EF 35-40, PASP 39, mild to moderate TR, mild LAE // Echo (12/14):  Base/mid inferior and inferolateral HK, EF 45%, mild LAE, mildly reduced RVSF   CKD (chronic kidney disease)    Depression    HLP (hyperkeratosis lenticularis perstans)    HTN (hypertension)    ICD (implantable cardiac defibrillator)  BSx    [employer]   Ischemic cardiomyopathy    Myocardial infarction ([place]) 1999   Near syncope 09/21/2014   Orthostatic hypotension    PAF (paroxysmal atrial fibrillation) (HCC)    Presence of permanent cardiac pacemaker    Wears partial dentures    upper    Past Surgical History:  Procedure Laterality Date   CARDIAC CATHETERIZATION N/A 12/11/2015   Procedure: Left Heart Cath and Coronary Angiography;  Surgeon: Charolette Forward, MD;  Location: [place] [facility];  Service: Cardiovascular;  Laterality: N/A;   CARDIAC DEFIBRILLATOR PLACEMENT     [employer]   CARDIAC DEFIBRILLATOR PLACEMENT  04/08/2016   NOT MRI SAFE (DOCUMENT  SCANNED IN SYSTEM   COLONOSCOPY  10/06/2007   Mild sigmoid diverticulosis. Small internal hemorrhoids.    CORONARY ARTERY BYPASS GRAFT     ELBOW SURGERY Left    plastic bone replacement   HERNIA REPAIR     x2   LEFT HEART CATH AND CORS/GRAFTS ANGIOGRAPHY N/A 01/07/2018   Procedure: LEFT HEART CATH AND CORS/GRAFTS ANGIOGRAPHY;  Surgeon: Burnell Blanks, MD;  Location: [place] [facility];  Service: Cardiovascular;  Laterality: N/A;   PERMANENT PACEMAKER GENERATOR CHANGE N/A 11/25/2013   Procedure: PERMANENT PACEMAKER GENERATOR CHANGE;  Surgeon: Deboraha Sprang, MD;  Location: [facility];  Service: Cardiovascular;  Laterality: N/A;    ROTATOR CUFF REPAIR Right  2006   SINUS EXPLORATION  1987   ULNAR NERVE TRANSPOSITION Left 07/19/2019   Procedure: Ulnar nerve release at the elbow with anterior transposition and flexor pronator lengthening, anterior interosseous nerve transfer to the deep motor branch of the ulnar nerve at the forearm, flexor digitorum profundus tendon transfer of the ring and small to middle fingers, ulnar nerve release at the wrist, Guyons canal;  Surgeon: Roseanne Kaufman, MD;  Location: [place];  Service: Orthoped   ULTRASOUND GUIDANCE FOR VASCULAR ACCESS  01/07/2018   Procedure: Ultrasound Guidance For Vascular Access;  Surgeon: Burnell Blanks, MD;  Location: [place] [facility];  Service: Cardiovascular;;    Current Outpatient Medications  Medication Sig Dispense Refill   apixaban (ELIQUIS) 2.5 MG TABS tablet Take 1 tablet (2.5 mg total) by mouth 2 (two) times daily. 60 tablet 5   buPROPion (WELLBUTRIN XL) 150 MG 24 hr tablet Take 1 tablet by mouth every morning.     carvedilol (COREG) 6.25 MG tablet Take 1 tablet (6.25 mg total) by mouth 2 (two) times daily with a meal. 180 tablet 3   clopidogrel (PLAVIX) 75 MG tablet Take 1 tablet (75 mg total) by mouth at bedtime. 90 tablet 3   colchicine 0.6 MG tablet Take 0.6 mg by mouth daily.      cyclobenzaprine (FLEXERIL) 5 MG tablet Take 5 mg by mouth 3 (three) times daily as needed for muscle spasms.     escitalopram (LEXAPRO) 10 MG tablet Take 10 mg by mouth 2 (two) times daily.     famotidine (PEPCID) 20 MG tablet Take 20 mg by mouth 2 (two) times daily.     gabapentin (NEURONTIN) 300 MG capsule Take 600 mg by mouth at bedtime.     nitroGLYCERIN (NITROSTAT) 0.4 MG SL tablet Place 1 tablet (0.4 mg total) under the tongue every 5 (five) minutes as needed for chest pain. 25 tablet 5   oxyCODONE-acetaminophen (PERCOCET) 10-325 MG tablet Take 1 tablet by mouth 4 (four) times daily as needed for pain.     PREVIDENT 5000 BOOSTER PLUS 1.1 % PSTE See admin instructions.     rosuvastatin  (CRESTOR) 40 MG tablet Take 1 tablet (40 mg total) by mouth daily. 90 tablet 2   sacubitril-valsartan (ENTRESTO) 24-26 MG Take 1 tablet by mouth 2 (two) times daily. 180 tablet 3   spironolactone (ALDACTONE) 25 MG tablet TAKE 1 TABLET (25 MG TOTAL) BY MOUTH DAILY AT NIGHT ONLY 90 tablet 3   traZODone (DESYREL) 50 MG tablet Take 50 mg by mouth at bedtime as needed for sleep.     triamcinolone cream (KENALOG) 0.1 % Apply 1 application topically as needed for rash.     No current facility-administered medications for this visit.    Allergies  Allergen Reactions  Tramadol Other (See Comments)    Unknown     Review of Systems negative except from HPI and PMH  Physical Exam BP 135/81   Pulse 65   Ht '5\' 10"'$  (1.778 m)   Wt 165 lb 9.6 oz (75.1 kg)   BMI 23.76 kg/m  Well developed and well nourished in no acute distress HENT normal Neck supple with JVP-flat Clear Device pocket well healed; without hematoma or erythema.  There is no tethering  Regular rate and rhythm, no murmur Abd-soft with active BS No Clubbing cyanosis  edema Skin-warm and dry A & Oriented  Grossly normal sensory and motor function  ECG atrial pacing at 65 Intervals 24/09/40 Nonspecific ST-T changes  Assessment and  Plan\  Ischemic cardiomyopathy  Implantable defibrillator    Hypertension-orthostatic hypotension-new  Device pocket pain-new  Renal insufficiency grade 3b  Congestive heart failure-chronic-systolic  Atrial fibrillation  [place]     No symptoms of ischemia.  Continue his Plavix and Eliquis.  Eliquis dosed appropriately for age and renal function.  Euvolemic.  We will continue him on his current Aldactone 25 mg.  Significant lightheadedness.  No syncope thankfully, but we will decrease his Entresto to half a pill twice daily; if this does not suffice, we will change her from Entresto to losartan.  With his cardiomyopathy, we will continue with carvedilol 6.25 twice daily and  Aldactone 25 which she is taking at night.  No interval atrial fibrillation based on his device

## 2022-04-10 NOTE — Patient Instructions (Addendum)
Medication Instructions:  Reduce Entresto 24-26 mg to 1/2 tablet two times a day.  Your physician recommends that you continue on your current medications as directed. Please refer to the Current Medication list given to you today. *If you need a refill on your cardiac medications before your next appointment, please call your pharmacy*  Lab Work: None. If you have labs (blood work) drawn today and your tests are completely normal, you will receive your results only by: [place] (if you have MyChart) OR A paper copy in the mail If you have any lab test that is abnormal or we need to change your treatment, we will call you to review the results.  Testing/Procedures: None.  Follow-Up: At [facility], you and your health needs are our priority.  As part of our continuing mission to provide you with exceptional heart care, we have created designated Provider Care Teams.  These Care Teams include your primary Cardiologist (physician) and Advanced Practice Providers (APPs -  Physician Assistants and Nurse Practitioners) who all work together to provide you with the care you need, when you need it.  Your physician wants you to follow-up in: 3 months with one of the following Advanced Practice Providers on your designated Care Team:    Connor Wilkerson, Connor Wilkerson, Connor Wilkerson   12 Months with Connor Axe, Connor Wilkerson  We recommend signing up for the patient portal called "MyChart".  Sign up information is provided on this After Visit Summary.  MyChart is used to connect with patients for Virtual Visits (Telemedicine).  Patients are able to view lab/test results, encounter notes, upcoming appointments, etc.  Non-urgent messages can be sent to your provider as well.   To learn more about what you can do with MyChart, go to [email or web address].    Any Other Special Instructions Will Be Listed Below (If Applicable).

## 2022-04-10 NOTE — Progress Notes (Signed)
Remote ICD transmission.   

## 2022-04-13 DIAGNOSIS — N189 Chronic kidney disease, unspecified: Secondary | ICD-10-CM | POA: Diagnosis not present

## 2022-04-13 DIAGNOSIS — R42 Dizziness and giddiness: Secondary | ICD-10-CM | POA: Diagnosis not present

## 2022-04-13 DIAGNOSIS — G319 Degenerative disease of nervous system, unspecified: Secondary | ICD-10-CM | POA: Diagnosis not present

## 2022-04-13 DIAGNOSIS — E860 Dehydration: Secondary | ICD-10-CM | POA: Diagnosis not present

## 2022-04-13 DIAGNOSIS — I130 Hypertensive heart and chronic kidney disease with heart failure and stage 1 through stage 4 chronic kidney disease, or unspecified chronic kidney disease: Secondary | ICD-10-CM | POA: Diagnosis not present

## 2022-04-13 DIAGNOSIS — R609 Edema, unspecified: Secondary | ICD-10-CM | POA: Diagnosis not present

## 2022-04-13 DIAGNOSIS — R410 Disorientation, unspecified: Secondary | ICD-10-CM | POA: Diagnosis not present

## 2022-04-13 DIAGNOSIS — I129 Hypertensive chronic kidney disease with stage 1 through stage 4 chronic kidney disease, or unspecified chronic kidney disease: Secondary | ICD-10-CM | POA: Diagnosis not present

## 2022-04-13 DIAGNOSIS — M19072 Primary osteoarthritis, left ankle and foot: Secondary | ICD-10-CM | POA: Diagnosis not present

## 2022-04-13 DIAGNOSIS — R55 Syncope and collapse: Secondary | ICD-10-CM | POA: Diagnosis not present

## 2022-04-13 DIAGNOSIS — E875 Hyperkalemia: Secondary | ICD-10-CM | POA: Diagnosis not present

## 2022-04-13 DIAGNOSIS — I959 Hypotension, unspecified: Secondary | ICD-10-CM | POA: Diagnosis not present

## 2022-04-13 DIAGNOSIS — R9431 Abnormal electrocardiogram [ECG] [EKG]: Secondary | ICD-10-CM | POA: Diagnosis not present

## 2022-04-13 DIAGNOSIS — M7989 Other specified soft tissue disorders: Secondary | ICD-10-CM | POA: Diagnosis not present

## 2022-04-13 DIAGNOSIS — R000 Tachycardia, unspecified: Secondary | ICD-10-CM | POA: Diagnosis not present

## 2022-04-13 DIAGNOSIS — I9589 Other hypotension: Secondary | ICD-10-CM | POA: Diagnosis not present

## 2022-04-13 DIAGNOSIS — I361 Nonrheumatic tricuspid (valve) insufficiency: Secondary | ICD-10-CM | POA: Diagnosis not present

## 2022-04-13 DIAGNOSIS — N179 Acute kidney failure, unspecified: Secondary | ICD-10-CM | POA: Diagnosis not present

## 2022-04-14 ENCOUNTER — Telehealth: Payer: Self-pay | Admitting: Internal Medicine

## 2022-04-14 DIAGNOSIS — I130 Hypertensive heart and chronic kidney disease with heart failure and stage 1 through stage 4 chronic kidney disease, or unspecified chronic kidney disease: Secondary | ICD-10-CM | POA: Diagnosis not present

## 2022-04-14 DIAGNOSIS — N179 Acute kidney failure, unspecified: Secondary | ICD-10-CM | POA: Diagnosis not present

## 2022-04-14 DIAGNOSIS — I959 Hypotension, unspecified: Secondary | ICD-10-CM | POA: Diagnosis not present

## 2022-04-14 NOTE — Telephone Encounter (Signed)
Pt c/o medication issue:  1. Name of Medication:  carvedilol (COREG) 6.25 MG tablet  apixaban (ELIQUIS) 2.5 MG TABS tablet  2. How are you currently taking this medication (dosage and times per day)?   3. Are you having a reaction (difficulty breathing--STAT)?   4. What is your medication issue?   Dee with [facility] would like to clarify whether or not the patient should be taking Carvedilol and Eliquis.  She also mentions they have documentation stating the patient is allergic to Amlodipine, but the patient states he has been taking it. She would like to know if we have prescribed it for him. Please advise.

## 2022-04-14 NOTE — Telephone Encounter (Signed)
Spoke with Connor Wilkerson at [facility].  She would like to know if pt is to be taking Plavix, Eliquis and ASA '81mg'$  and Amlodipine.  Advised RN will discuss with Dr Caryl Comes and return call.

## 2022-04-14 NOTE — Telephone Encounter (Signed)
RN clarified with Dr Caryl Comes that from our perspective pt should not be on Plavix and ASA '81mg'$  but continue on Eliquis 2.'5mg'$  - 1 tablet by mouth bid.   Dee with [facility] advised of this information and thanked Therapist, sports for the phone call.

## 2022-04-14 NOTE — Telephone Encounter (Signed)
Was calling back for update. Please advise

## 2022-04-14 NOTE — Telephone Encounter (Signed)
Patient's wife states the patient is in [facility] and they were told Dr. Nyoka Cowden would get in touch with Dr. Caryl Comes. She would like to know if the office has received anything from them. She says if she gets a call this afternoon to call her home number, but if it's tomorrow morning to call her cell phone.

## 2022-04-15 DIAGNOSIS — I130 Hypertensive heart and chronic kidney disease with heart failure and stage 1 through stage 4 chronic kidney disease, or unspecified chronic kidney disease: Secondary | ICD-10-CM | POA: Diagnosis not present

## 2022-04-15 DIAGNOSIS — I959 Hypotension, unspecified: Secondary | ICD-10-CM | POA: Diagnosis not present

## 2022-04-15 DIAGNOSIS — N179 Acute kidney failure, unspecified: Secondary | ICD-10-CM | POA: Diagnosis not present

## 2022-04-15 NOTE — Telephone Encounter (Signed)
Spoke with pt's wife, DPR who reports pt is being discharged today from [place] to dizziness and a fall.  She states pt was advised at his last OV with Dr Caryl Comes he was advised to cut Entresto in half but pt did not do that and ultimately had an episode of low BP with dizziness and a hard fall per his wife.   Pt advised to follow discharge instructions and follow up with Dr Caryl Comes as directed.  Pt and pt's wife verbalize understanding and agrees with current plan.

## 2022-04-18 DIAGNOSIS — N179 Acute kidney failure, unspecified: Secondary | ICD-10-CM | POA: Diagnosis not present

## 2022-04-18 DIAGNOSIS — Z6823 Body mass index (BMI) 23.0-23.9, adult: Secondary | ICD-10-CM | POA: Diagnosis not present

## 2022-04-18 DIAGNOSIS — I255 Ischemic cardiomyopathy: Secondary | ICD-10-CM | POA: Diagnosis not present

## 2022-04-18 DIAGNOSIS — M79672 Pain in left foot: Secondary | ICD-10-CM | POA: Diagnosis not present

## 2022-04-19 ENCOUNTER — Encounter: Payer: Self-pay | Admitting: Internal Medicine

## 2022-04-21 DIAGNOSIS — M79672 Pain in left foot: Secondary | ICD-10-CM | POA: Diagnosis not present

## 2022-04-23 DIAGNOSIS — K7460 Unspecified cirrhosis of liver: Secondary | ICD-10-CM | POA: Diagnosis not present

## 2022-04-23 DIAGNOSIS — I2510 Atherosclerotic heart disease of native coronary artery without angina pectoris: Secondary | ICD-10-CM | POA: Diagnosis not present

## 2022-04-23 DIAGNOSIS — I129 Hypertensive chronic kidney disease with stage 1 through stage 4 chronic kidney disease, or unspecified chronic kidney disease: Secondary | ICD-10-CM | POA: Diagnosis not present

## 2022-04-23 DIAGNOSIS — I5020 Unspecified systolic (congestive) heart failure: Secondary | ICD-10-CM | POA: Diagnosis not present

## 2022-04-23 DIAGNOSIS — N2581 Secondary hyperparathyroidism of renal origin: Secondary | ICD-10-CM | POA: Diagnosis not present

## 2022-04-23 DIAGNOSIS — N1832 Chronic kidney disease, stage 3b: Secondary | ICD-10-CM | POA: Diagnosis not present

## 2022-04-27 NOTE — Progress Notes (Signed)
Cardiology Office Note Date:  04/30/2022  Patient ID:  Connor, Wilkerson 11-Feb-1941, MRN [id number] PCP:  Marga Hoots, NP  Electrophysiologist: Dr. Caryl Comes   Chief Complaint:   post hospital ???  History of Present Illness: Connor Wilkerson is a 81 y.o. male with history of CAD/CABG (1999), ICM with ICD, chronic CHF, HTN, HLD, CRI (stage IIIb),  AFib, PVD with vascular non-obstructive carotid disease (cotniues to follow with Dr. Trula Slade), orthostatic changes, chronic back pain and arthritic pain.  He comes in today to be seen for Dr. Caryl Comes, last seen by him 04/10/22, described dizziness/lightheaded, no syncope, this reported an improvement, momentary episodes of CP, chronic stable SOB, doing well with Eliquis. His Entresto dose reduced given orthostatic symptoms. Not felt to be volume OL. Planned for a 3 mo visit with an EP APP and in a year with him  Pt message 04/14/22, via wife that he had a fall and was in the hospital at [facility] for it, reprted that despite Dr. Olin Pia recommendation to reduce his Delene Loll he did not and suffered a fall. At time of discharge advised to f/u with [place].  TODAY He is accompanied by his wife. The day of his fainting spell, he had been to church then a church picnic, was hot out had not eaten or drank anything that morning, never did reduce his Entresto dose. When they gt home he was coming out of the bathroom and felt weak, lightheaded and fainted. His wife reports that they gave him a lot f fluids over the 2 days in the hospital and told them he was quite dehydrated.  He has not had any CP, palpitations No SOB  He continues to feel a bit "foggy" They did scan his head without injury/bleeding He twisted his ankle, this is still what bothers him the most, they have seen ortho and is not broken but gave him a boot to wear but is not, he said is too uncomfortable  He is not on coreg, his wife brought his home meds. She is not sure when or why  that was stopped    Device information:  BSCi dual chamber ICD, implanted 03/14/05, gen change 11/25/13 to sub-pectoral placement, Dr. Caryl Comes    Past Medical History:  Diagnosis Date   Abnormality of gait 02/07/2015   AICD (automatic cardioverter/defibrillator) present    [place]   CAD (coronary artery disease)    Hx of Inf MI treated with CABG in 1999 // Myoview 11/16: mild inf-lat ischemia, EF 34, high risk // LHC 3/19: 3/3 bypass grafts patent   Carotid artery disease ([place])    Followed by [facility] (Dr. Trula Slade) // [place] in 2/19: bilat ICA 73-53   Chronic systolic CHF (congestive heart failure) ([place]) 01/01/2010   Echo 12/30/2017: Mild LVH, EF 30-35, diffuse HK, grade 1 diastolic dysfunction, mildly reduced RVSF // Echo 04/12/2016:  EF 35-40, PASP 39, mild to moderate TR, mild LAE // Echo (12/14):  Base/mid inferior and inferolateral HK, EF 45%, mild LAE, mildly reduced RVSF   CKD (chronic kidney disease)    Depression    HLP (hyperkeratosis lenticularis perstans)    HTN (hypertension)    ICD (implantable cardiac defibrillator)  BSx    [employer]   Ischemic cardiomyopathy    Myocardial infarction ([place]) 1999   Near syncope 09/21/2014   Orthostatic hypotension    PAF (paroxysmal atrial fibrillation) (H[facility])    Presence of permanent cardiac pacemaker    Wears partial  dentures    upper    Past Surgical History:  Procedure Laterality Date   CARDIAC CATHETERIZATION N/A 12/11/2015   Procedure: Left Heart Cath and Coronary Angiography;  Surgeon: Charolette Forward, MD;  Location: [place] [facility];  Service: Cardiovascular;  Laterality: N/A;   CARDIAC DEFIBRILLATOR PLACEMENT     [employer]   CARDIAC DEFIBRILLATOR PLACEMENT  04/08/2016   NOT MRI SAFE (DOCUMENT SCANNED IN SYSTEM   COLONOSCOPY  10/06/2007   Mild sigmoid diverticulosis. Small internal hemorrhoids.    CORONARY ARTERY BYPASS GRAFT     ELBOW SURGERY Left    plastic bone replacement   HERNIA REPAIR     x2    LEFT HEART CATH AND CORS/GRAFTS ANGIOGRAPHY N/A 01/07/2018   Procedure: LEFT HEART CATH AND CORS/GRAFTS ANGIOGRAPHY;  Surgeon: Burnell Blanks, MD;  Location: [place] [facility];  Service: Cardiovascular;  Laterality: N/A;   PERMANENT PACEMAKER GENERATOR CHANGE N/A 11/25/2013   Procedure: PERMANENT PACEMAKER GENERATOR CHANGE;  Surgeon: Deboraha Sprang, MD;  Location: [facility];  Service: Cardiovascular;  Laterality: N/A;    ROTATOR CUFF REPAIR Right 2006   SINUS EXPLORATION  1987   ULNAR NERVE TRANSPOSITION Left 07/19/2019   Procedure: Ulnar nerve release at the elbow with anterior transposition and flexor pronator lengthening, anterior interosseous nerve transfer to the deep motor branch of the ulnar nerve at the forearm, flexor digitorum profundus tendon transfer of the ring and small to middle fingers, ulnar nerve release at the wrist, Guyons canal;  Surgeon: Roseanne Kaufman, MD;  Location: [place];  Service: Orthoped   ULTRASOUND GUIDANCE FOR VASCULAR ACCESS  01/07/2018   Procedure: Ultrasound Guidance For Vascular Access;  Surgeon: Burnell Blanks, MD;  Location: [place] [facility];  Service: Cardiovascular;;    Current Outpatient Medications  Medication Sig Dispense Refill   apixaban (ELIQUIS) 2.5 MG TABS tablet Take 1 tablet (2.5 mg total) by mouth 2 (two) times daily. 60 tablet 5   buPROPion (WELLBUTRIN XL) 150 MG 24 hr tablet Take 1 tablet by mouth every morning.     colchicine 0.6 MG tablet Take 0.6 mg by mouth daily.      cyclobenzaprine (FLEXERIL) 5 MG tablet Take 5 mg by mouth 3 (three) times daily as needed for muscle spasms.     escitalopram (LEXAPRO) 10 MG tablet Take 10 mg by mouth 2 (two) times daily.     famotidine (PEPCID) 20 MG tablet Take 20 mg by mouth 2 (two) times daily.     gabapentin (NEURONTIN) 300 MG capsule Take 300 mg by mouth 3 (three) times daily.     nitroGLYCERIN (NITROSTAT) 0.4 MG SL tablet Place 1 tablet (0.4 mg total) under the tongue every 5 (five)  minutes as needed for chest pain. 25 tablet 5   oxyCODONE-acetaminophen (PERCOCET) 10-325 MG tablet Take 1 tablet by mouth 4 (four) times daily as needed for pain.     rosuvastatin (CRESTOR) 40 MG tablet Take 1 tablet (40 mg total) by mouth daily. 90 tablet 2   sacubitril-valsartan (ENTRESTO) 24-26 MG Take 0.5 tablets by mouth 2 (two) times daily. 90 tablet 3   spironolactone (ALDACTONE) 25 MG tablet Take 0.5 tablets (12.5 mg total) by mouth daily. 45 tablet 3   traZODone (DESYREL) 50 MG tablet Take 50 mg by mouth at bedtime as needed for sleep.     No current facility-administered medications for this visit.    Allergies:   Tramadol   Social History:  The patient  reports that he quit smoking about 22 years ago. His smoking use included pipe. He has never used smokeless tobacco. He reports that he does not drink alcohol and does not use drugs.   Family History:  The patient's family history includes Heart attack in his brother, father, and mother; Heart disease in his brother, father, and mother.  ROS:  Please see the history of present illness.    All other systems are reviewed and otherwise negative.   PHYSICAL EXAM:  VS:  BP 100/70   Pulse 88   Ht '5\' 10"'$  (1.778 m)   Wt 166 lb (75.3 kg)   SpO2 97%   BMI 23.82 kg/m  BMI: Body mass index is 23.82 kg/m. Well nourished, well developed, in no acute distress, chronically ill appearing HEENT: normocephalic, atraumatic  Neck: no JVD, carotid bruits or masses Cardiac:    RRR; no significant murmurs, no rubs, or gallops Lungs:  CTA b/l, no wheezing, rhonchi or rales  Abd: soft, nontender MS: no deformity, age appropriate atrophy Ext: no edema Skin: warm and dry, no rash Neuro:  No gross deficits are appreciated, speech is clear Psych: euthymic mood, full affect  ICD site (sub-pec) is stable, no tethering or discomfort   EKG:  not done today  ICD check today and reviewed by myself:  Battery and lead measurements are good No new  episodes Some NSVT by history No treated episodes There are ATR events all are seconds in duration, no EGMs  04/13/22: TTE LVEF 55-60% LA mildly dilated  01/07/18: LHC Prox RCA lesion is 100% stenosed. SVG graft was visualized by angiography and is normal in caliber. Post Atrio lesion is 60% stenosed. Prox Cx lesion is 100% stenosed. SVG graft was visualized by angiography and is normal in caliber. LIMA graft was visualized by angiography and is normal in caliber. Ost LAD lesion is 40% stenosed. Prox LAD lesion is 40% stenosed. Prox LAD to Mid LAD lesion is 90% stenosed.   1. Severe triple vessel CAD s/p 3V CABG with 3/3 patent bypass grafts 2. Severe stenosis mid LAD. The mid and distal LAD fills from the LIMA graft 3. The mid Circumflex is chronically occluded. The OM branch fills from the patent vein graft 4. The RCA is chronically occluded just beyond the ostium. The distal RCA and PDA fills from the patent vein graft. The native PDA has moderate stenosis beyond the graft insertion which is unchanged from cardiac cath in 2017.    Recommendations: Continue medical management of CAD.   12/30/17: TTE Study Conclusions - Left ventricle: The cavity size was normal. Wall thickness was   increased in a pattern of mild LVH. Systolic function was   moderately to severely reduced. The estimated ejection fraction   was in the range of 30% to 35%. Diffuse hypokinesis. Doppler   parameters are consistent with abnormal left ventricular   relaxation (grade 1 diastolic dysfunction). - Right ventricle: Systolic function was mildly reduced.  12/11/15 LHC Conclusion     Ost RCA to Mid RCA lesion, 100% stenosed. Ost RPDA lesion, 70% stenosed. Ost Cx lesion, 60% stenosed. Prox Cx lesion, 80% stenosed. Prox Cx to Mid Cx lesion, 100% stenosed. Ost LM lesion, 40% stenosed. Prox LAD lesion, 40% stenosed. Mid LAD-1 lesion, 70% stenosed. Mid LAD-2 lesion, 100% stenosed.  Findings Left main is  30-40% diffuse disease LAD has 40-50% proximal and 70% mid stenosis and then 100% occluded filling by LIMA to LAD Left circumflex and 50-60% ostial stenosis and  80% proximal stenosis and then 100% occluded beyond the midportion. OM1 is very small is patent RCA is 100% occluded at the ostium Saphenous vein graft to PDA has 70-80% anastomotic stenosis as before at the trifurcation not suitable for PCI Saphenous vein graft to obtuse marginal was patent LIMA to LAD is patent   10/11/13: Echocardiogram Study Conclusions - Left ventricle: Moderate hypokinesis of base/mid inferior   wall and base/mid inferolateral wall. The cavity size was   normal. Wall thickness was normal. The estimated ejection   fraction was 45%. - Left atrium: The atrium was mildly dilated. - Right ventricle: The cavity size was normal. Pacer wire or   catheter noted in right ventricle. Systolic function was   mildly reduced.   Recent Labs: No results found for requested labs within last 365 days.  No results found for requested labs within last 365 days.   CrCl cannot be calculated (Patient's most recent lab result is older than the maximum 21 days allowed.).   Wt Readings from Last 3 Encounters:  04/30/22 166 lb (75.3 kg)  04/10/22 165 lb 9.6 oz (75.1 kg)  10/07/21 166 lb (75.3 kg)     Other studies reviewed: Additional studies/records reviewed today include: summarized above   ASSESSMENT AND PLAN:  1. ICD     Intact function     No programming changes made  2. ICM 3. CHF     exam is euvolemic    No symptoms or exam findings of volume OL    Echo at [facility] with recovered LVEF  4. CAD     No anginal symptoms     On statin, nitrate     No ASA w/Eliquis      5. Paroxysmal AFib      No EGMs, all brief episodes        On Eliquis       Recent hospital stay with labs, will try to get them   6. Carotid disease     Dr. Trula Slade  7.  HTN 8. Orthostatic history, recent syncope      BP remains a  bit low      Sounds like he was found volume depleted  Reduce his spironolactone in 1/2 to 12.'5mg'$  daily Stay off coreg for now Will have him back in a couple weeks to check on BP, symptoms/orthstatic and try to get him back on a BB, maybe Toprol             Disposition: as above    Current medicines are reviewed at length with the patient today.  The patient did not have any concerns regarding medicines.  Haywood Lasso, PA-C 04/30/2022 5:46 PM     [address] [place] [id number] [phone] (office)  [phone] (fax)

## 2022-04-30 ENCOUNTER — Ambulatory Visit (INDEPENDENT_AMBULATORY_CARE_PROVIDER_SITE_OTHER): Payer: Medicare PPO | Admitting: Physician Assistant

## 2022-04-30 ENCOUNTER — Encounter: Payer: Self-pay | Admitting: Physician Assistant

## 2022-04-30 VITALS — BP 100/70 | HR 88 | Ht 70.0 in | Wt 166.0 lb

## 2022-04-30 DIAGNOSIS — I255 Ischemic cardiomyopathy: Secondary | ICD-10-CM

## 2022-04-30 DIAGNOSIS — Z95810 Presence of automatic (implantable) cardiac defibrillator: Secondary | ICD-10-CM

## 2022-04-30 DIAGNOSIS — I10 Essential (primary) hypertension: Secondary | ICD-10-CM

## 2022-04-30 DIAGNOSIS — I5022 Chronic systolic (congestive) heart failure: Secondary | ICD-10-CM | POA: Diagnosis not present

## 2022-04-30 DIAGNOSIS — I480 Paroxysmal atrial fibrillation: Secondary | ICD-10-CM

## 2022-04-30 LAB — CUP PACEART INCLINIC DEVICE CHECK
Date Time Interrogation Session: 20230628191703
HighPow Impedance: 51 Ohm
Implantable Lead Implant Date: 20060512
Implantable Lead Implant Date: 20060512
Implantable Lead Location: 753859
Implantable Lead Location: 753860
Implantable Lead Model: 158
Implantable Lead Model: 5076
Implantable Lead Serial Number: 159477
Implantable Pulse Generator Implant Date: 20150123
Lead Channel Impedance Value: 403 Ohm
Lead Channel Impedance Value: 707 Ohm
Lead Channel Pacing Threshold Amplitude: 0.7 V
Lead Channel Pacing Threshold Amplitude: 0.9 V
Lead Channel Pacing Threshold Pulse Width: 0.4 ms
Lead Channel Pacing Threshold Pulse Width: 0.4 ms
Lead Channel Sensing Intrinsic Amplitude: 1.7 mV
Lead Channel Sensing Intrinsic Amplitude: 10.3 mV
Lead Channel Setting Pacing Amplitude: 2 V
Lead Channel Setting Pacing Amplitude: 2.4 V
Lead Channel Setting Pacing Pulse Width: 0.4 ms
Lead Channel Setting Sensing Sensitivity: 0.6 mV
Pulse Gen Serial Number: 112776

## 2022-04-30 MED ORDER — SPIRONOLACTONE 25 MG PO TABS: 12.5000 mg | ORAL_TABLET | Freq: Every day | ORAL | 3 refills | Status: DC

## 2022-04-30 NOTE — Patient Instructions (Signed)
Medication Instructions:  Your physician has recommended you make the following change in your medication:   REDUCE the Spironolactone to 25 mg taking only 1/2 tablet daily   *If you need a refill on your cardiac medications before your next appointment, please call your pharmacy*   Lab Work: None ordered  If you have labs (blood work) drawn today and your tests are completely normal, you will receive your results only by: [place] (if you have MyChart) OR A paper copy in the mail If you have any lab test that is abnormal or we need to change your treatment, we will call you to review the results.   Testing/Procedures: None ordered   Follow-Up: At [facility], you and your health needs are our priority.  As part of our continuing mission to provide you with exceptional heart care, we have created designated Provider Care Teams.  These Care Teams include your primary Cardiologist (physician) and Advanced Practice Providers (APPs -  Physician Assistants and Nurse Practitioners) who all work together to provide you with the care you need, when you need it.  We recommend signing up for the patient portal called "MyChart".  Sign up information is provided on this After Visit Summary.  MyChart is used to connect with patients for Virtual Visits (Telemedicine).  Patients are able to view lab/test results, encounter notes, upcoming appointments, etc.  Non-urgent messages can be sent to your provider as well.   To learn more about what you can do with MyChart, go to [email or web address].    Your next appointment:   05/14/22 arrive at 2:05   The format for your next appointment:   In Person  Provider:   Tommye Standard, PA-C    Other Instructions   Important Information About Sugar

## 2022-05-07 DIAGNOSIS — M79672 Pain in left foot: Secondary | ICD-10-CM | POA: Diagnosis not present

## 2022-05-11 NOTE — Progress Notes (Deleted)
Cardiology Office Note Date:  05/11/2022  Patient ID:  Johnn, Krasowski 1941/08/11, MRN [id number] PCP:  Marga Hoots, NP  Electrophysiologist: Dr. Caryl Comes   Chief Complaint:   *** planned f/u  History of Present Illness: MERLON ALCORTA is a 81 y.o. male with history of CAD/CABG (1999), ICM with ICD, chronic CHF, HTN, HLD, CRI (stage IIIb),  AFib, PVD with vascular non-obstructive carotid disease (cotniues to follow with Dr. Trula Slade), orthostatic changes, chronic back pain and arthritic pain.  He comes in today to be seen for Dr. Caryl Comes, last seen by him 04/10/22, described dizziness/lightheaded, no syncope, this reported an improvement, momentary episodes of CP, chronic stable SOB, doing well with Eliquis. His Entresto dose reduced given orthostatic symptoms. Not felt to be volume OL. Planned for a 3 mo visit with an EP APP and in a year with him  Pt message 04/14/22, via wife that he had a fall and was in the hospital at [facility] for it, reprted that despite Dr. Olin Pia recommendation to reduce his Delene Loll he did not and suffered a fall. At time of discharge advised to f/u with [place].  I saw him 04/30/22 He is accompanied by his wife. The day of his fainting spell, he had been to church then a church picnic, was hot out had not eaten or drank anything that morning, never did reduce his Entresto dose. When they gt home he was coming out of the bathroom and felt weak, lightheaded and fainted. His wife reports that they gave him a lot f fluids over the 2 days in the hospital and told them he was quite dehydrated.  He has not had any CP, palpitations No SOB He continues to feel a bit "foggy" They did scan his head without injury/bleeding He twisted his ankle, this is still what bothers him the most, they have seen ortho and is not broken but gave him a boot to wear but is not, he said is too uncomfortable He is not on coreg, his wife brought his home meds. She is not sure when or  why that was stopped  [name] echo noted preserved/recovered LVEF   BP was still a bit low, aldactone reduced to 12.'5mg'$  daily Kept low dose entresto Kept off coreg Planned for a 2 week visit to perhaps get him on Toprol if able  *** BP? *** symptoms *** ? Low dose Toprol   Device information:  BSCi dual chamber ICD, implanted 03/14/05, gen change 11/25/13 to sub-pectoral placement, Dr. Caryl Comes    Past Medical History:  Diagnosis Date   Abnormality of gait 02/07/2015   AICD (automatic cardioverter/defibrillator) present    [place]   CAD (coronary artery disease)    Hx of Inf MI treated with CABG in 1999 // Myoview 11/16: mild inf-lat ischemia, EF 34, high risk // LHC 3/19: 3/3 bypass grafts patent   Carotid artery disease ([place])    Followed by [facility] (Dr. Trula Slade) // [place] in 2/19: bilat ICA 76-72   Chronic systolic CHF (congestive heart failure) ([place]) 01/01/2010   Echo 12/30/2017: Mild LVH, EF 30-35, diffuse HK, grade 1 diastolic dysfunction, mildly reduced RVSF // Echo 04/12/2016:  EF 35-40, PASP 39, mild to moderate TR, mild LAE // Echo (12/14):  Base/mid inferior and inferolateral HK, EF 45%, mild LAE, mildly reduced RVSF   CKD (chronic kidney disease)    Depression    HLP (hyperkeratosis lenticularis perstans)    HTN (hypertension)    ICD (implantable  cardiac defibrillator)  BSx    [employer]   Ischemic cardiomyopathy    Myocardial infarction [facility]) 1999   Near syncope 09/21/2014   Orthostatic hypotension    PAF (paroxysmal atrial fibrillation) (HCC)    Presence of permanent cardiac pacemaker    Wears partial dentures    upper    Past Surgical History:  Procedure Laterality Date   CARDIAC CATHETERIZATION N/A 12/11/2015   Procedure: Left Heart Cath and Coronary Angiography;  Surgeon: Charolette Forward, MD;  Location: [place] [facility];  Service: Cardiovascular;  Laterality: N/A;   CARDIAC DEFIBRILLATOR PLACEMENT     [employer]   CARDIAC DEFIBRILLATOR  PLACEMENT  04/08/2016   NOT MRI SAFE (DOCUMENT SCANNED IN SYSTEM   COLONOSCOPY  10/06/2007   Mild sigmoid diverticulosis. Small internal hemorrhoids.    CORONARY ARTERY BYPASS GRAFT     ELBOW SURGERY Left    plastic bone replacement   HERNIA REPAIR     x2   LEFT HEART CATH AND CORS/GRAFTS ANGIOGRAPHY N/A 01/07/2018   Procedure: LEFT HEART CATH AND CORS/GRAFTS ANGIOGRAPHY;  Surgeon: Burnell Blanks, MD;  Location: [place] [facility];  Service: Cardiovascular;  Laterality: N/A;   PERMANENT PACEMAKER GENERATOR CHANGE N/A 11/25/2013   Procedure: PERMANENT PACEMAKER GENERATOR CHANGE;  Surgeon: Deboraha Sprang, MD;  Location: [facility] CATH LAB;  Service: Cardiovascular;  Laterality: N/A;    ROTATOR CUFF REPAIR Right 2006   SINUS EXPLORATION  1987   ULNAR NERVE TRANSPOSITION Left 07/19/2019   Procedure: Ulnar nerve release at the elbow with anterior transposition and flexor pronator lengthening, anterior interosseous nerve transfer to the deep motor branch of the ulnar nerve at the forearm, flexor digitorum profundus tendon transfer of the ring and small to middle fingers, ulnar nerve release at the wrist, Guyons canal;  Surgeon: Roseanne Kaufman, MD;  Location: [place];  Service: Orthoped   ULTRASOUND GUIDANCE FOR VASCULAR ACCESS  01/07/2018   Procedure: Ultrasound Guidance For Vascular Access;  Surgeon: Burnell Blanks, MD;  Location: [place] [facility];  Service: Cardiovascular;;    Current Outpatient Medications  Medication Sig Dispense Refill   apixaban (ELIQUIS) 2.5 MG TABS tablet Take 1 tablet (2.5 mg total) by mouth 2 (two) times daily. 60 tablet 5   buPROPion (WELLBUTRIN XL) 150 MG 24 hr tablet Take 1 tablet by mouth every morning.     colchicine 0.6 MG tablet Take 0.6 mg by mouth daily.      cyclobenzaprine (FLEXERIL) 5 MG tablet Take 5 mg by mouth 3 (three) times daily as needed for muscle spasms.     escitalopram (LEXAPRO) 10 MG tablet Take 10 mg by mouth 2 (two) times daily.      famotidine (PEPCID) 20 MG tablet Take 20 mg by mouth 2 (two) times daily.     gabapentin (NEURONTIN) 300 MG capsule Take 300 mg by mouth 3 (three) times daily.     nitroGLYCERIN (NITROSTAT) 0.4 MG SL tablet Place 1 tablet (0.4 mg total) under the tongue every 5 (five) minutes as needed for chest pain. 25 tablet 5   oxyCODONE-acetaminophen (PERCOCET) 10-325 MG tablet Take 1 tablet by mouth 4 (four) times daily as needed for pain.     rosuvastatin (CRESTOR) 40 MG tablet Take 1 tablet (40 mg total) by mouth daily. 90 tablet 2   sacubitril-valsartan (ENTRESTO) 24-26 MG Take 0.5 tablets by mouth 2 (two) times daily. 90 tablet 3   spironolactone (ALDACTONE) 25 MG tablet Take 0.5 tablets (12.5 mg  total) by mouth daily. 45 tablet 3   traZODone (DESYREL) 50 MG tablet Take 50 mg by mouth at bedtime as needed for sleep.     No current facility-administered medications for this visit.    Allergies:   Tramadol   Social History:  The patient  reports that he quit smoking about 22 years ago. His smoking use included pipe. He has never used smokeless tobacco. He reports that he does not drink alcohol and does not use drugs.   Family History:  The patient's family history includes Heart attack in his brother, father, and mother; Heart disease in his brother, father, and mother.  ROS:  Please see the history of present illness.    All other systems are reviewed and otherwise negative.   PHYSICAL EXAM:  VS:  There were no vitals taken for this visit. BMI: There is no height or weight on file to calculate BMI. Well nourished, well developed, in no acute distress, chronically ill appearing HEENT: normocephalic, atraumatic  Neck: no JVD, carotid bruits or masses Cardiac:    *** RRR; no significant murmurs, no rubs, or gallops Lungs:  *** CTA b/l, no wheezing, rhonchi or rales  Abd: soft, nontender MS: no deformity, age appropriate atrophy Ext: *** no edema Skin: warm and dry, no rash Neuro:  No gross  deficits are appreciated, speech is clear Psych: euthymic mood, full affect  *** ICD site (sub-pec) is stable, no tethering or discomfort   EKG:  not done today  ICD check today and reviewed by myself:  ***   04/13/22: TTE LVEF 55-60% LA mildly dilated  01/07/18: LHC Prox RCA lesion is 100% stenosed. SVG graft was visualized by angiography and is normal in caliber. Post Atrio lesion is 60% stenosed. Prox Cx lesion is 100% stenosed. SVG graft was visualized by angiography and is normal in caliber. LIMA graft was visualized by angiography and is normal in caliber. Ost LAD lesion is 40% stenosed. Prox LAD lesion is 40% stenosed. Prox LAD to Mid LAD lesion is 90% stenosed.   1. Severe triple vessel CAD s/p 3V CABG with 3/3 patent bypass grafts 2. Severe stenosis mid LAD. The mid and distal LAD fills from the LIMA graft 3. The mid Circumflex is chronically occluded. The OM branch fills from the patent vein graft 4. The RCA is chronically occluded just beyond the ostium. The distal RCA and PDA fills from the patent vein graft. The native PDA has moderate stenosis beyond the graft insertion which is unchanged from cardiac cath in 2017.    Recommendations: Continue medical management of CAD.   12/30/17: TTE Study Conclusions - Left ventricle: The cavity size was normal. Wall thickness was   increased in a pattern of mild LVH. Systolic function was   moderately to severely reduced. The estimated ejection fraction   was in the range of 30% to 35%. Diffuse hypokinesis. Doppler   parameters are consistent with abnormal left ventricular   relaxation (grade 1 diastolic dysfunction). - Right ventricle: Systolic function was mildly reduced.  12/11/15 LHC Conclusion     Ost RCA to Mid RCA lesion, 100% stenosed. Ost RPDA lesion, 70% stenosed. Ost Cx lesion, 60% stenosed. Prox Cx lesion, 80% stenosed. Prox Cx to Mid Cx lesion, 100% stenosed. Ost LM lesion, 40% stenosed. Prox LAD lesion, 40%  stenosed. Mid LAD-1 lesion, 70% stenosed. Mid LAD-2 lesion, 100% stenosed.  Findings Left main is 30-40% diffuse disease LAD has 40-50% proximal and 70% mid stenosis and then 100%  occluded filling by LIMA to LAD Left circumflex and 50-60% ostial stenosis and 80% proximal stenosis and then 100% occluded beyond the midportion. OM1 is very small is patent RCA is 100% occluded at the ostium Saphenous vein graft to PDA has 70-80% anastomotic stenosis as before at the trifurcation not suitable for PCI Saphenous vein graft to obtuse marginal was patent LIMA to LAD is patent   10/11/13: Echocardiogram Study Conclusions - Left ventricle: Moderate hypokinesis of base/mid inferior   wall and base/mid inferolateral wall. The cavity size was   normal. Wall thickness was normal. The estimated ejection   fraction was 45%. - Left atrium: The atrium was mildly dilated. - Right ventricle: The cavity size was normal. Pacer wire or   catheter noted in right ventricle. Systolic function was   mildly reduced.   Recent Labs: No results found for requested labs within last 365 days.  No results found for requested labs within last 365 days.   CrCl cannot be calculated (Patient's most recent lab result is older than the maximum 21 days allowed.).   Wt Readings from Last 3 Encounters:  04/30/22 166 lb (75.3 kg)  04/10/22 165 lb 9.6 oz (75.1 kg)  10/07/21 166 lb (75.3 kg)     Other studies reviewed: Additional studies/records reviewed today include: summarized above   ASSESSMENT AND PLAN:  1. ICD     Intact function     No programming changes made  2. ICM 3. CHF     *** exam is euvolemic    No symptoms or exam findings of volume OL    *** Echo at [facility] with recovered LVEF  4. CAD     No anginal symptoms     On statin, nitrate     No ASA w/Eliquis      5. Paroxysmal AFib      No EGMs, all brief episodes        On Eliquis       *** Recent hospital stay with labs, will try to get  them   6. Carotid disease     Dr. Trula Slade  7.  HTN 8. Orthostatic history, recent syncope ***      Disposition: ***    Current medicines are reviewed at length with the patient today.  The patient did not have any concerns regarding medicines.  Haywood Lasso, PA-C 05/11/2022 6:55 PM     [address] [place] [address] [phone] (office)  [phone] (fax)

## 2022-05-14 ENCOUNTER — Encounter: Payer: Medicare PPO | Admitting: Physician Assistant

## 2022-05-27 DIAGNOSIS — F419 Anxiety disorder, unspecified: Secondary | ICD-10-CM | POA: Diagnosis not present

## 2022-05-27 DIAGNOSIS — E7800 Pure hypercholesterolemia, unspecified: Secondary | ICD-10-CM | POA: Diagnosis not present

## 2022-05-27 DIAGNOSIS — M1990 Unspecified osteoarthritis, unspecified site: Secondary | ICD-10-CM | POA: Diagnosis not present

## 2022-05-27 DIAGNOSIS — K219 Gastro-esophageal reflux disease without esophagitis: Secondary | ICD-10-CM | POA: Diagnosis not present

## 2022-05-27 DIAGNOSIS — I219 Acute myocardial infarction, unspecified: Secondary | ICD-10-CM | POA: Diagnosis not present

## 2022-05-27 DIAGNOSIS — I2510 Atherosclerotic heart disease of native coronary artery without angina pectoris: Secondary | ICD-10-CM | POA: Diagnosis not present

## 2022-05-27 DIAGNOSIS — N189 Chronic kidney disease, unspecified: Secondary | ICD-10-CM | POA: Diagnosis not present

## 2022-05-28 DIAGNOSIS — I2510 Atherosclerotic heart disease of native coronary artery without angina pectoris: Secondary | ICD-10-CM | POA: Diagnosis not present

## 2022-05-28 DIAGNOSIS — M25511 Pain in right shoulder: Secondary | ICD-10-CM | POA: Diagnosis not present

## 2022-05-28 DIAGNOSIS — D6869 Other thrombophilia: Secondary | ICD-10-CM | POA: Diagnosis not present

## 2022-05-29 ENCOUNTER — Ambulatory Visit: Payer: Medicare PPO | Admitting: Physician Assistant

## 2022-05-29 ENCOUNTER — Encounter: Payer: Self-pay | Admitting: Physician Assistant

## 2022-05-29 VITALS — BP 128/72 | HR 73 | Ht 70.0 in | Wt 166.0 lb

## 2022-05-29 DIAGNOSIS — I2510 Atherosclerotic heart disease of native coronary artery without angina pectoris: Secondary | ICD-10-CM | POA: Diagnosis not present

## 2022-05-29 DIAGNOSIS — I10 Essential (primary) hypertension: Secondary | ICD-10-CM

## 2022-05-29 DIAGNOSIS — I480 Paroxysmal atrial fibrillation: Secondary | ICD-10-CM

## 2022-05-29 DIAGNOSIS — I255 Ischemic cardiomyopathy: Secondary | ICD-10-CM

## 2022-05-29 DIAGNOSIS — Z95810 Presence of automatic (implantable) cardiac defibrillator: Secondary | ICD-10-CM | POA: Diagnosis not present

## 2022-05-29 DIAGNOSIS — I5022 Chronic systolic (congestive) heart failure: Secondary | ICD-10-CM

## 2022-05-29 LAB — CUP PACEART INCLINIC DEVICE CHECK
Date Time Interrogation Session: 20230727190114
Implantable Lead Implant Date: 20060512
Implantable Lead Implant Date: 20060512
Implantable Lead Location: 753859
Implantable Lead Location: 753860
Implantable Lead Model: 158
Implantable Lead Model: 5076
Implantable Lead Serial Number: 159477
Implantable Pulse Generator Implant Date: 20150123
Lead Channel Pacing Threshold Amplitude: 0.8 V
Lead Channel Pacing Threshold Amplitude: 1 V
Lead Channel Pacing Threshold Pulse Width: 0.4 ms
Lead Channel Pacing Threshold Pulse Width: 0.4 ms
Lead Channel Sensing Intrinsic Amplitude: 1.3 mV
Lead Channel Sensing Intrinsic Amplitude: 11.1 mV
Pulse Gen Serial Number: 112776

## 2022-05-29 NOTE — Progress Notes (Signed)
Cardiology Office Note Date:  05/29/2022  Patient ID:  [name], [name] [birth year], MRN [id number] PCP:  Marga Hoots, NP  Electrophysiologist: Dr. Caryl Comes   Chief Complaint:    planned f/u  History of Present Illness: Connor Wilkerson is a 81 y.o. male with history of CAD/CABG (1999), ICM with ICD, chronic CHF, HTN, HLD, CRI (stage IIIb),  AFib, PVD with vascular non-obstructive carotid disease (cotniues to follow with Dr. Trula Slade), orthostatic changes, chronic back pain and arthritic pain.  He comes in today to be seen for Dr. Caryl Comes, last seen by him 04/10/22, described dizziness/lightheaded, no syncope, this reported an improvement, momentary episodes of CP, chronic stable SOB, doing well with Eliquis. His Entresto dose reduced given orthostatic symptoms. Not felt to be volume OL. Planned for a 3 mo visit with an EP APP and in a year with him  Pt message 04/14/22, via wife that he had a fall and was in the hospital at [facility] for it, reprted that despite Dr. Olin Pia recommendation to reduce his Delene Loll he did not and suffered a fall. At time of discharge advised to f/u with [place].  I saw him 04/30/22 He is accompanied by his wife. The day of his fainting spell, he had been to church then a church picnic, was hot out had not eaten or drank anything that morning, never did reduce his Entresto dose. When they gt home he was coming out of the bathroom and felt weak, lightheaded and fainted. His wife reports that they gave him a lot f fluids over the 2 days in the hospital and told them he was quite dehydrated.  He has not had any CP, palpitations No SOB He continues to feel a bit "foggy" They did scan his head without injury/bleeding He twisted his ankle, this is still what bothers him the most, they have seen ortho and is not broken but gave him a boot to wear but is not, he said is too uncomfortable He is not on coreg, his wife brought his home meds. She is not sure when or  why that was stopped  [name] echo noted preserved/recovered LVEF   BP was still a bit low, aldactone reduced to 12.'5mg'$  daily Kept low dose entresto Kept off coreg Planned for a 2 week visit to perhaps get him on Toprol if able  TODAY He is alone today Though reports doing well Biggest issue is his gout and R shoulder pain from an old injury and arthritis, inquires if he could have a shoulder surgery if needed No near syncope or syncope. No CP, he feels like his heart is doing well Not overly active but outside of orthopedic issues, able to do his ADLs without cardiac symptoms  His BP at home has been higher more often the lower or normal, getting up towards 150's/90s   Device information:  BSCi dual chamber ICD, implanted 03/14/05, gen change 11/25/13 to sub-pectoral placement, Dr. Caryl Comes    Past Medical History:  Diagnosis Date   Abnormality of gait 02/07/2015   AICD (automatic cardioverter/defibrillator) present    [place]   CAD (coronary artery disease)    Hx of Inf MI treated with CABG in 1999 // Myoview 11/16: mild inf-lat ischemia, EF 34, high risk // LHC 3/19: 3/3 bypass grafts patent   Carotid artery disease ([place])    Followed by [facility] (Dr. Trula Slade) // [place] in 2/19: bilat ICA 50-93   Chronic systolic CHF (congestive heart failure) ([place]) 01/01/2010  Echo 12/30/2017: Mild LVH, EF 30-35, diffuse HK, grade 1 diastolic dysfunction, mildly reduced RVSF // Echo 04/12/2016:  EF 35-40, PASP 39, mild to moderate TR, mild LAE // Echo (12/14):  Base/mid inferior and inferolateral HK, EF 45%, mild LAE, mildly reduced RVSF   CKD (chronic kidney disease)    Depression    HLP (hyperkeratosis lenticularis perstans)    HTN (hypertension)    ICD (implantable cardiac defibrillator)  BSx    [employer]   Ischemic cardiomyopathy    Myocardial infarction ([place]) 1999   Near syncope 09/21/2014   Orthostatic hypotension    PAF (paroxysmal atrial fibrillation) (HCC)    Presence  of permanent cardiac pacemaker    Wears partial dentures    upper    Past Surgical History:  Procedure Laterality Date   CARDIAC CATHETERIZATION N/A 12/11/2015   Procedure: Left Heart Cath and Coronary Angiography;  Surgeon: Charolette Forward, MD;  Location: [place] [facility];  Service: Cardiovascular;  Laterality: N/A;   CARDIAC DEFIBRILLATOR PLACEMENT     [employer]   CARDIAC DEFIBRILLATOR PLACEMENT  04/08/2016   NOT MRI SAFE (DOCUMENT SCANNED IN SYSTEM   COLONOSCOPY  10/06/2007   Mild sigmoid diverticulosis. Small internal hemorrhoids.    CORONARY ARTERY BYPASS GRAFT     ELBOW SURGERY Left    plastic bone replacement   HERNIA REPAIR     x2   LEFT HEART CATH AND CORS/GRAFTS ANGIOGRAPHY N/A 01/07/2018   Procedure: LEFT HEART CATH AND CORS/GRAFTS ANGIOGRAPHY;  Surgeon: Burnell Blanks, MD;  Location: [place] [facility];  Service: Cardiovascular;  Laterality: N/A;   PERMANENT PACEMAKER GENERATOR CHANGE N/A 11/25/2013   Procedure: PERMANENT PACEMAKER GENERATOR CHANGE;  Surgeon: Deboraha Sprang, MD;  Location: [facility] CATH LAB;  Service: Cardiovascular;  Laterality: N/A;    ROTATOR CUFF REPAIR Right 2006   SINUS EXPLORATION  1987   ULNAR NERVE TRANSPOSITION Left 07/19/2019   Procedure: Ulnar nerve release at the elbow with anterior transposition and flexor pronator lengthening, anterior interosseous nerve transfer to the deep motor branch of the ulnar nerve at the forearm, flexor digitorum profundus tendon transfer of the ring and small to middle fingers, ulnar nerve release at the wrist, Guyons canal;  Surgeon: Roseanne Kaufman, MD;  Location: [place];  Service: Orthoped   ULTRASOUND GUIDANCE FOR VASCULAR ACCESS  01/07/2018   Procedure: Ultrasound Guidance For Vascular Access;  Surgeon: Burnell Blanks, MD;  Location: [place] [facility];  Service: Cardiovascular;;    Current Outpatient Medications  Medication Sig Dispense Refill   apixaban (ELIQUIS) 2.5 MG TABS tablet Take 1 tablet  (2.5 mg total) by mouth 2 (two) times daily. 60 tablet 5   buPROPion (WELLBUTRIN XL) 150 MG 24 hr tablet Take 1 tablet by mouth every morning.     colchicine 0.6 MG tablet Take 0.6 mg by mouth daily.      cyclobenzaprine (FLEXERIL) 5 MG tablet Take 5 mg by mouth 3 (three) times daily as needed for muscle spasms.     escitalopram (LEXAPRO) 10 MG tablet Take 10 mg by mouth 2 (two) times daily.     famotidine (PEPCID) 20 MG tablet Take 20 mg by mouth 2 (two) times daily.     gabapentin (NEURONTIN) 300 MG capsule Take 300 mg by mouth 3 (three) times daily.     nitroGLYCERIN (NITROSTAT) 0.4 MG SL tablet Place 1 tablet (0.4 mg total) under the tongue every 5 (five) minutes as needed for chest pain. 25 tablet  5   oxyCODONE-acetaminophen (PERCOCET) 10-325 MG tablet Take 1 tablet by mouth 4 (four) times daily as needed for pain.     rosuvastatin (CRESTOR) 40 MG tablet Take 1 tablet (40 mg total) by mouth daily. 90 tablet 2   sacubitril-valsartan (ENTRESTO) 24-26 MG Take 0.5 tablets by mouth 2 (two) times daily. 90 tablet 3   spironolactone (ALDACTONE) 25 MG tablet Take 0.5 tablets (12.5 mg total) by mouth daily. 45 tablet 3   traZODone (DESYREL) 50 MG tablet Take 50 mg by mouth at bedtime as needed for sleep.     No current facility-administered medications for this visit.    Allergies:   Tramadol   Social History:  The patient  reports that he quit smoking about 22 years ago. His smoking use included pipe. He has never used smokeless tobacco. He reports that he does not drink alcohol and does not use drugs.   Family History:  The patient's family history includes Heart attack in his brother, father, and mother; Heart disease in his brother, father, and mother.  ROS:  Please see the history of present illness.    All other systems are reviewed and otherwise negative.   PHYSICAL EXAM:  VS:  There were no vitals taken for this visit. BMI: There is no height or weight on file to calculate BMI. Well  nourished, well developed, in no acute distress, chronically ill appearing HEENT: normocephalic, atraumatic  Neck: no JVD, carotid bruits or masses Cardiac:    RRR; no significant murmurs, no rubs, or gallops Lungs:   CTA b/l, no wheezing, rhonchi or rales  Abd: soft, nontender MS: no deformity, age appropriate atrophy Ext: no edema Skin: warm and dry, no rash Neuro:  No gross deficits are appreciated, speech is clear Psych: euthymic mood, full affect  ICD site (sub-pec) is stable, no tethering or discomfort   EKG:  not done today  ICD check today and reviewed by myself:  Battery and lead measurements are good No all tachy events are brief SVTs binned as NSVTs , one a brief AFlutter   04/13/22: TTE LVEF 55-60% LA mildly dilated  01/07/18: LHC Prox RCA lesion is 100% stenosed. SVG graft was visualized by angiography and is normal in caliber. Post Atrio lesion is 60% stenosed. Prox Cx lesion is 100% stenosed. SVG graft was visualized by angiography and is normal in caliber. LIMA graft was visualized by angiography and is normal in caliber. Ost LAD lesion is 40% stenosed. Prox LAD lesion is 40% stenosed. Prox LAD to Mid LAD lesion is 90% stenosed.   1. Severe triple vessel CAD s/p 3V CABG with 3/3 patent bypass grafts 2. Severe stenosis mid LAD. The mid and distal LAD fills from the LIMA graft 3. The mid Circumflex is chronically occluded. The OM branch fills from the patent vein graft 4. The RCA is chronically occluded just beyond the ostium. The distal RCA and PDA fills from the patent vein graft. The native PDA has moderate stenosis beyond the graft insertion which is unchanged from cardiac cath in 2017.    Recommendations: Continue medical management of CAD.   12/30/17: TTE Study Conclusions - Left ventricle: The cavity size was normal. Wall thickness was   increased in a pattern of mild LVH. Systolic function was   moderately to severely reduced. The estimated ejection  fraction   was in the range of 30% to 35%. Diffuse hypokinesis. Doppler   parameters are consistent with abnormal left ventricular   relaxation (grade  1 diastolic dysfunction). - Right ventricle: Systolic function was mildly reduced.  12/11/15 LHC Conclusion     Ost RCA to Mid RCA lesion, 100% stenosed. Ost RPDA lesion, 70% stenosed. Ost Cx lesion, 60% stenosed. Prox Cx lesion, 80% stenosed. Prox Cx to Mid Cx lesion, 100% stenosed. Ost LM lesion, 40% stenosed. Prox LAD lesion, 40% stenosed. Mid LAD-1 lesion, 70% stenosed. Mid LAD-2 lesion, 100% stenosed.  Findings Left main is 30-40% diffuse disease LAD has 40-50% proximal and 70% mid stenosis and then 100% occluded filling by LIMA to LAD Left circumflex and 50-60% ostial stenosis and 80% proximal stenosis and then 100% occluded beyond the midportion. OM1 is very small is patent RCA is 100% occluded at the ostium Saphenous vein graft to PDA has 70-80% anastomotic stenosis as before at the trifurcation not suitable for PCI Saphenous vein graft to obtuse marginal was patent LIMA to LAD is patent   10/11/13: Echocardiogram Study Conclusions - Left ventricle: Moderate hypokinesis of base/mid inferior   wall and base/mid inferolateral wall. The cavity size was   normal. Wall thickness was normal. The estimated ejection   fraction was 45%. - Left atrium: The atrium was mildly dilated. - Right ventricle: The cavity size was normal. Pacer wire or   catheter noted in right ventricle. Systolic function was   mildly reduced.   Recent Labs: No results found for requested labs within last 365 days.  No results found for requested labs within last 365 days.   CrCl cannot be calculated (Patient's most recent lab result is older than the maximum 21 days allowed.).   Wt Readings from Last 3 Encounters:  04/30/22 166 lb (75.3 kg)  04/10/22 165 lb 9.6 oz (75.1 kg)  10/07/21 166 lb (75.3 kg)     Other studies reviewed: Additional  studies/records reviewed today include: summarized above   ASSESSMENT AND PLAN:  1. ICD     Intact function     No programming changes made  2. ICM 3. CHF     exam is euvolemic    No symptoms or exam findings of volume OL    Echo at [facility] with recovered LVEF  4. CAD     No anginal symptoms     On statin, nitrate     No ASA w/Eliquis      5. Paroxysmal AFib      No EGMs, all brief episodes        On Eliquis       Recent hospital stay with labs, will try to get them   6. Carotid disease     Dr. Trula Slade  7.  HTN 8. Orthostatic history, recent syncope Not recurrent BP perhaps can tolerate some low dose BB, he is asked to check his BP daily and call with the readings, if able will try some Toprol      Disposition: return as scheduled, sooner if needed    Current medicines are reviewed at length with the patient today.  The patient did not have any concerns regarding medicines.  Haywood Lasso, PA-C 05/29/2022 5:42 AM     [facility] [place] [address] [phone] (office)  [phone] (fax)

## 2022-05-29 NOTE — Patient Instructions (Signed)
Medication Instructions:  Your physician recommends that you continue on your current medications as directed. Please refer to the Current Medication list given to you today.  *If you need a refill on your cardiac medications before your next appointment, please call your pharmacy*   Lab Work: None If you have labs (blood work) drawn today and your tests are completely normal, you will receive your results only by: [place] (if you have MyChart) OR A paper copy in the mail If you have any lab test that is abnormal or we need to change your treatment, we will call you to review the results.   Follow-Up: At [facility], you and your health needs are our priority.  As part of our continuing mission to provide you with exceptional heart care, we have created designated Provider Care Teams.  These Care Teams include your primary Cardiologist (physician) and Advanced Practice Providers (APPs -  Physician Assistants and Nurse Practitioners) who all work together to provide you with the care you need, when you need it.  Your next appointment:   As scheduled  Call us next week with your daily Blood Pressure readings

## 2022-06-02 DIAGNOSIS — F419 Anxiety disorder, unspecified: Secondary | ICD-10-CM | POA: Diagnosis not present

## 2022-06-02 DIAGNOSIS — K219 Gastro-esophageal reflux disease without esophagitis: Secondary | ICD-10-CM | POA: Diagnosis not present

## 2022-06-02 DIAGNOSIS — E7800 Pure hypercholesterolemia, unspecified: Secondary | ICD-10-CM | POA: Diagnosis not present

## 2022-06-02 DIAGNOSIS — M109 Gout, unspecified: Secondary | ICD-10-CM | POA: Diagnosis not present

## 2022-06-02 DIAGNOSIS — N189 Chronic kidney disease, unspecified: Secondary | ICD-10-CM | POA: Diagnosis not present

## 2022-06-02 DIAGNOSIS — I2510 Atherosclerotic heart disease of native coronary artery without angina pectoris: Secondary | ICD-10-CM | POA: Diagnosis not present

## 2022-06-02 DIAGNOSIS — I219 Acute myocardial infarction, unspecified: Secondary | ICD-10-CM | POA: Diagnosis not present

## 2022-06-02 DIAGNOSIS — G2581 Restless legs syndrome: Secondary | ICD-10-CM | POA: Diagnosis not present

## 2022-06-02 DIAGNOSIS — M1990 Unspecified osteoarthritis, unspecified site: Secondary | ICD-10-CM | POA: Diagnosis not present

## 2022-06-09 DIAGNOSIS — T148XXA Other injury of unspecified body region, initial encounter: Secondary | ICD-10-CM | POA: Diagnosis not present

## 2022-06-09 DIAGNOSIS — M79672 Pain in left foot: Secondary | ICD-10-CM | POA: Diagnosis not present

## 2022-06-09 DIAGNOSIS — M76812 Anterior tibial syndrome, left leg: Secondary | ICD-10-CM | POA: Diagnosis not present

## 2022-06-17 DIAGNOSIS — M19111 Post-traumatic osteoarthritis, right shoulder: Secondary | ICD-10-CM | POA: Diagnosis not present

## 2022-06-17 DIAGNOSIS — M25511 Pain in right shoulder: Secondary | ICD-10-CM | POA: Diagnosis not present

## 2022-06-19 ENCOUNTER — Other Ambulatory Visit: Payer: Self-pay | Admitting: *Deleted

## 2022-06-19 DIAGNOSIS — L080 Pyoderma: Secondary | ICD-10-CM | POA: Diagnosis not present

## 2022-06-19 DIAGNOSIS — L570 Actinic keratosis: Secondary | ICD-10-CM | POA: Diagnosis not present

## 2022-06-19 DIAGNOSIS — D485 Neoplasm of uncertain behavior of skin: Secondary | ICD-10-CM | POA: Diagnosis not present

## 2022-06-19 DIAGNOSIS — L821 Other seborrheic keratosis: Secondary | ICD-10-CM | POA: Diagnosis not present

## 2022-06-19 DIAGNOSIS — I739 Peripheral vascular disease, unspecified: Secondary | ICD-10-CM

## 2022-06-19 DIAGNOSIS — I6523 Occlusion and stenosis of bilateral carotid arteries: Secondary | ICD-10-CM

## 2022-06-19 DIAGNOSIS — L578 Other skin changes due to chronic exposure to nonionizing radiation: Secondary | ICD-10-CM | POA: Diagnosis not present

## 2022-06-25 ENCOUNTER — Ambulatory Visit: Payer: Medicare PPO

## 2022-06-25 ENCOUNTER — Ambulatory Visit (HOSPITAL_COMMUNITY): Payer: Medicare PPO

## 2022-06-27 ENCOUNTER — Ambulatory Visit (HOSPITAL_COMMUNITY): Payer: Medicare PPO | Attending: Physician Assistant

## 2022-06-27 ENCOUNTER — Ambulatory Visit (HOSPITAL_COMMUNITY): Payer: Medicare PPO

## 2022-07-01 ENCOUNTER — Ambulatory Visit: Payer: Medicare PPO | Admitting: Physician Assistant

## 2022-07-01 ENCOUNTER — Ambulatory Visit (INDEPENDENT_AMBULATORY_CARE_PROVIDER_SITE_OTHER)
Admission: RE | Admit: 2022-07-01 | Discharge: 2022-07-01 | Disposition: A | Discharge status: Home / Self Care | Payer: Medicare PPO | Source: Ambulatory Visit | Attending: Vascular Surgery | Admitting: Vascular Surgery

## 2022-07-01 ENCOUNTER — Ambulatory Visit (HOSPITAL_COMMUNITY)
Admission: RE | Admit: 2022-07-01 | Discharge: 2022-07-01 | Disposition: A | Discharge status: Home / Self Care | Payer: Medicare PPO | Source: Ambulatory Visit | Attending: Physician Assistant | Admitting: Physician Assistant

## 2022-07-01 VITALS — BP 132/75 | HR 60 | Temp 97.8°F | Ht 70.0 in | Wt 168.0 lb

## 2022-07-01 DIAGNOSIS — I6523 Occlusion and stenosis of bilateral carotid arteries: Secondary | ICD-10-CM

## 2022-07-01 DIAGNOSIS — I739 Peripheral vascular disease, unspecified: Secondary | ICD-10-CM

## 2022-07-01 DIAGNOSIS — S91302A Unspecified open wound, left foot, initial encounter: Secondary | ICD-10-CM

## 2022-07-01 NOTE — Progress Notes (Signed)
Office Note     CC:  follow up Requesting Provider:  Carvel Getting Key, *  HPI: Connor Wilkerson is a 81 y.o. (1941/09/07) male who presents for surveillance of carotid artery stenosis as well as evaluation of nonhealing left foot wound.  He denies any CVA or TIA since last office visit.  He also denies any strokelike symptoms currently including slurring speech, vision changes, or one-sided weakness.  He is on a statin daily.  He is on Eliquis for atrial fibrillation.  We were also asked to evaluate nonhealing wound of his left foot.  In the past couple weeks he states has been improving with antibiotic ointment prescribed by his PCP.  He denies claudication or rest pain in his foot.   Past Medical History:  Diagnosis Date   Abnormality of gait 02/07/2015   AICD (automatic cardioverter/defibrillator) present    [place]   CAD (coronary artery disease)    Hx of Inf MI treated with CABG in 1999 // Myoview 11/16: mild inf-lat ischemia, EF 34, high risk // LHC 3/19: 3/3 bypass grafts patent   Carotid artery disease ([place])    Followed by [facility] (Dr. Trula Slade) // [place] in 2/19: bilat ICA 16-10   Chronic systolic CHF (congestive heart failure) ([place]) 01/01/2010   Echo 12/30/2017: Mild LVH, EF 30-35, diffuse HK, grade 1 diastolic dysfunction, mildly reduced RVSF // Echo 04/12/2016:  EF 35-40, PASP 39, mild to moderate TR, mild LAE // Echo (12/14):  Base/mid inferior and inferolateral HK, EF 45%, mild LAE, mildly reduced RVSF   CKD (chronic kidney disease)    Depression    HLP (hyperkeratosis lenticularis perstans)    HTN (hypertension)    ICD (implantable cardiac defibrillator)  BSx    [employer]   Ischemic cardiomyopathy    Myocardial infarction ([place]) 1999   Near syncope 09/21/2014   Orthostatic hypotension    PAF (paroxysmal atrial fibrillation) (HCC)    Presence of permanent cardiac pacemaker    Wears partial dentures    upper    Past Surgical History:  Procedure  Laterality Date   CARDIAC CATHETERIZATION N/A 12/11/2015   Procedure: Left Heart Cath and Coronary Angiography;  Surgeon: Charolette Forward, MD;  Location: [place] [facility];  Service: Cardiovascular;  Laterality: N/A;   CARDIAC DEFIBRILLATOR PLACEMENT     [employer]   CARDIAC DEFIBRILLATOR PLACEMENT  04/08/2016   NOT MRI SAFE (DOCUMENT SCANNED IN SYSTEM   COLONOSCOPY  10/06/2007   Mild sigmoid diverticulosis. Small internal hemorrhoids.    CORONARY ARTERY BYPASS GRAFT     ELBOW SURGERY Left    plastic bone replacement   HERNIA REPAIR     x2   LEFT HEART CATH AND CORS/GRAFTS ANGIOGRAPHY N/A 01/07/2018   Procedure: LEFT HEART CATH AND CORS/GRAFTS ANGIOGRAPHY;  Surgeon: Burnell Blanks, MD;  Location: [place] [facility];  Service: Cardiovascular;  Laterality: N/A;   PERMANENT PACEMAKER GENERATOR CHANGE N/A 11/25/2013   Procedure: PERMANENT PACEMAKER GENERATOR CHANGE;  Surgeon: Deboraha Sprang, MD;  Location: [facility] C[facility] LAB;  Service: Cardiovascular;  Laterality: N/A;    ROTATOR CUFF REPAIR Right 2006   SINUS EXPLORATION  1987   ULNAR NERVE TRANSPOSITION Left 07/19/2019   Procedure: Ulnar nerve release at the elbow with anterior transposition and flexor pronator lengthening, anterior interosseous nerve transfer to the deep motor branch of the ulnar nerve at the forearm, flexor digitorum profundus tendon transfer of the ring and small to middle fingers, ulnar nerve release  at the wrist, Guyons canal;  Surgeon: Roseanne Kaufman, MD;  Location: [place];  Service: Orthoped   ULTRASOUND GUIDANCE FOR VASCULAR ACCESS  01/07/2018   Procedure: Ultrasound Guidance For Vascular Access;  Surgeon: Burnell Blanks, MD;  Location: [place] [facility];  Service: Cardiovascular;;    Social History   Socioeconomic History   Marital status: Married    Spouse name: Not on file   Number of children: 1   Years of education: HS +   Highest education level: Not on file  Occupational History   Occupation:  retired  Tobacco Use   Smoking status: Former    Types: Pipe    Quit date: 09/19/1999    Years since quitting: 22.7   Smokeless tobacco: Never   Tobacco comments:    quit in 1998  Vaping Use   Vaping Use: Never used  Substance and Sexual Activity   Alcohol use: No    Alcohol/week: 0.0 standard drinks of alcohol    Comment: recovering alcoholic, quit 24MGN ago (2000)   Drug use: No   Sexual activity: Not on file  Other Topics Concern   Not on file  Social History Narrative   Lives at home w/ his wife.   Patient is right handed.   Patient drinks about 3 cups of soda daily.   Social Determinants of Health   Financial Resource Strain: Not on file  Food Insecurity: Not on file  Transportation Needs: Not on file  Physical Activity: Not on file  Stress: Not on file  Social Connections: Not on file  Intimate Partner Violence: Not on file    Family History  Problem Relation Age of Onset   Heart disease Mother    Heart attack Mother    Heart disease Father    Heart attack Father    Heart disease Brother    Heart attack Brother     Current Outpatient Medications  Medication Sig Dispense Refill   apixaban (ELIQUIS) 2.5 MG TABS tablet Take 1 tablet (2.5 mg total) by mouth 2 (two) times daily. 60 tablet 5   buPROPion (WELLBUTRIN XL) 150 MG 24 hr tablet Take 1 tablet by mouth every morning.     colchicine 0.6 MG tablet Take 0.6 mg by mouth daily.      cyclobenzaprine (FLEXERIL) 5 MG tablet Take 5 mg by mouth 3 (three) times daily as needed for muscle spasms.     escitalopram (LEXAPRO) 10 MG tablet Take 10 mg by mouth 2 (two) times daily.     famotidine (PEPCID) 20 MG tablet Take 20 mg by mouth 2 (two) times daily.     gabapentin (NEURONTIN) 300 MG capsule Take 300 mg by mouth 3 (three) times daily.     nitroGLYCERIN (NITROSTAT) 0.4 MG SL tablet Place 1 tablet (0.4 mg total) under the tongue every 5 (five) minutes as needed for chest pain. 25 tablet 5   oxyCODONE-acetaminophen  (PERCOCET) 10-325 MG tablet Take 1 tablet by mouth 4 (four) times daily as needed for pain.     rosuvastatin (CRESTOR) 40 MG tablet Take 1 tablet (40 mg total) by mouth daily. 90 tablet 2   sacubitril-valsartan (ENTRESTO) 24-26 MG Take 0.5 tablets by mouth 2 (two) times daily. 90 tablet 3   spironolactone (ALDACTONE) 25 MG tablet Take 0.5 tablets (12.5 mg total) by mouth daily. 45 tablet 3   traZODone (DESYREL) 50 MG tablet Take 50 mg by mouth at bedtime as needed for sleep.  No current facility-administered medications for this visit.    Allergies  Allergen Reactions   Tramadol Other (See Comments)    Unknown      REVIEW OF SYSTEMS:   '[X]'$  denotes positive finding, '[ ]'$  denotes negative finding Cardiac  Comments:  Chest pain or chest pressure:    Shortness of breath upon exertion:    Short of breath when lying flat:    Irregular heart rhythm:        Vascular    Pain in calf, thigh, or hip brought on by ambulation:    Pain in feet at night that wakes you up from your sleep:     Blood clot in your veins:    Leg swelling:         Pulmonary    Oxygen at home:    Productive cough:     Wheezing:         Neurologic    Sudden weakness in arms or legs:     Sudden numbness in arms or legs:     Sudden onset of difficulty speaking or slurred speech:    Temporary loss of vision in one eye:     Problems with dizziness:         Gastrointestinal    Blood in stool:     Vomited blood:         Genitourinary    Burning when urinating:     Blood in urine:        Psychiatric    Major depression:         Hematologic    Bleeding problems:    Problems with blood clotting too easily:        Skin    Rashes or ulcers:        Constitutional    Fever or chills:      PHYSICAL EXAMINATION:  Vitals:   07/01/22 1015  BP: 132/75  Pulse: 60  Temp: 97.8 F (36.6 C)  TempSrc: Temporal  SpO2: 98%  Weight: 168 lb (76.2 kg)  Height: '5\' 10"'$  (1.778 m)    General:  WDWN in NAD;  vital signs documented above Gait: Not observed HENT: WNL, normocephalic Pulmonary: normal non-labored breathing , without Rales, rhonchi,  wheezing Cardiac: regular HR Abdomen: soft, NT, no masses Skin: without rashes Vascular Exam/Pulses:  Right Left  DP 2+ (normal) 2+ (normal)  PT absent absent   Extremities: Left foot wound between the fourth and fifth toe pictured below Musculoskeletal: no muscle wasting or atrophy  Neurologic: A&O X 3;  No focal weakness or paresthesias are detected Psychiatric:  The pt has Normal affect.     Non-Invasive Vascular Imaging:   ABI/TBIToday's ABIToday's TBIPrevious ABIPrevious TBI  +-------+-----------+-----------+------------+------------+  Right  [place]         0.60       [place]          0.65          +-------+-----------+-----------+------------+------------+  Left   [place]         0.85       [place]          0.64             ASSESSMENT/PLAN:: 81 y.o. male here for follow up for surveillance of carotid artery stenosis  -Carotid duplex unchanged with 40 to 59% stenosis of bilateral internal carotid arteries.  No indication for intervention with this level of asymptomatic stenosis.  Repeat carotid duplex in 1 year per protocol.  Continue statin daily.  -Patient has a wound between his left fourth and fifth toe that he states has been present for 3 months.  Over the past couple weeks of using a prescription strength antibiotic ointment he believes the wound has improved.  He has a palpable AT pulse on the side however has noncompressible tibial vessels suggesting tibial vessel occlusive disease.  He is adamantly against anything invasive at this time.  I think it is reasonable given that the wound has shown improvement over the last couple weeks that we continue wound care.  He will also wash this wound with soap and water daily and pat to dry.  He will follow-up in 3 to 4 weeks to follow progress.  If at that time wound has worsened or has not  shown any further improvement he will again be offered angiography.  He knows to call/return office sooner with any questions or concerns.   Dagoberto Ligas, PA-C [facility] [phone]  Clinic MD:   Stanford Breed

## 2022-07-02 ENCOUNTER — Encounter (HOSPITAL_COMMUNITY): Payer: Medicare PPO

## 2022-07-02 ENCOUNTER — Telehealth: Payer: Self-pay | Admitting: Internal Medicine

## 2022-07-02 DIAGNOSIS — I219 Acute myocardial infarction, unspecified: Secondary | ICD-10-CM | POA: Diagnosis not present

## 2022-07-02 DIAGNOSIS — G2581 Restless legs syndrome: Secondary | ICD-10-CM | POA: Diagnosis not present

## 2022-07-02 DIAGNOSIS — N189 Chronic kidney disease, unspecified: Secondary | ICD-10-CM | POA: Diagnosis not present

## 2022-07-02 DIAGNOSIS — E7800 Pure hypercholesterolemia, unspecified: Secondary | ICD-10-CM | POA: Diagnosis not present

## 2022-07-02 DIAGNOSIS — F419 Anxiety disorder, unspecified: Secondary | ICD-10-CM | POA: Diagnosis not present

## 2022-07-02 DIAGNOSIS — I255 Ischemic cardiomyopathy: Secondary | ICD-10-CM | POA: Diagnosis not present

## 2022-07-02 MED ORDER — NITROGLYCERIN 0.4 MG SL SUBL: 0.4000 mg | SUBLINGUAL_TABLET | SUBLINGUAL | 11 refills | Status: DC | PRN

## 2022-07-02 NOTE — Telephone Encounter (Signed)
. *  STAT* If patient is at the pharmacy, call can be transferred to refill team.   1. Which medications need to be refilled? (please list name of each medication and dose if known) nitroGLYCERIN (NITROSTAT) 0.4 MG SL tablet  2. Which pharmacy/location (including street and city if local pharmacy) is medication to be sent to?[id number] [place], [place] - [address]  3. Do they need a 30 day or 90 day supply? 1 bottle

## 2022-07-02 NOTE — Telephone Encounter (Signed)
Pt's medication was sent to pt's pharmacy as requested. Confirmation received.  °

## 2022-07-02 NOTE — Telephone Encounter (Signed)
Reviewed medications with patient's wife and clarified that patient is not taking carvedilol and amlodipine. She verbalized understanding.

## 2022-07-02 NOTE — Telephone Encounter (Signed)
  Pt c/o medication issue:  1. Name of Medication: carvedilol and amlodipine   2. How are you currently taking this medication (dosage and times per day)?   3. Are you having a reaction (difficulty breathing--STAT)?   4. What is your medication issue? Pt's wife said, pt has amlodipine at home but not sure if pt needs to take it. Also, she wanted to verify again if pt needs to take carvedilol. If yes they need refill sent to his pharmacy on file. Its not on pt's med list

## 2022-07-07 ENCOUNTER — Other Ambulatory Visit: Payer: Self-pay | Admitting: Internal Medicine

## 2022-07-09 ENCOUNTER — Encounter: Payer: Self-pay | Admitting: Internal Medicine

## 2022-07-09 ENCOUNTER — Telehealth: Payer: Self-pay | Admitting: Internal Medicine

## 2022-07-09 DIAGNOSIS — I739 Peripheral vascular disease, unspecified: Secondary | ICD-10-CM | POA: Diagnosis not present

## 2022-07-09 NOTE — Telephone Encounter (Signed)
Pt c/o medication issue:  1. Name of Medication: Baby Aspirin   2. How are you currently taking this medication (dosage and times per day)?   3. Are you having a reaction (difficulty breathing--STAT)?   4. What is your medication issue? Pt's wife is requesting call back to discuss if her husband should be taking baby aspirin still. She believes she was told it was no longer needed, but can not remember.

## 2022-07-09 NOTE — Telephone Encounter (Signed)
Spoke with pt's wife, DPR and advised per previous conversation dated 04/14/2022:   Note RN clarified with Dr Caryl Comes that from our perspective pt should not be on Plavix and ASA '81mg'$  but continue on Eliquis 2.'5mg'$  - 1 tablet by mouth bid.       Pt's wife also asking if Dr Caryl Comes has received a clearance request for pt's shoulder surgery.  Pt's wife advised no record of request in Epic.  Pt's wife states she will contact office and request that be sent.  She states a date has not been set for the surgery.  Pt's wife verbalizes understanding of all information discussed and thanked Therapist, sports for the callback.

## 2022-07-21 NOTE — Progress Notes (Unsigned)
Cardiology Office Note Date:  07/21/2022  Patient ID:  Connor, Wilkerson 1940-11-23, MRN [id number] PCP:  Marga Hoots, NP  Electrophysiologist: Dr. Caryl Comes   Chief Complaint:    *** planned f/u, pending shoulder surgery  History of Present Illness: Connor Wilkerson is a 81 y.o. male with history of CAD/CABG (1999), ICM with ICD, chronic CHF, HTN, HLD, CRI (stage IIIb),  AFib, PVD with vascular non-obstructive carotid disease (cotniues to follow with Dr. Trula Slade), orthostatic changes, chronic back pain and arthritic pain.  He comes in today to be seen for Dr. Caryl Comes, last seen by him 04/10/22, described dizziness/lightheaded, no syncope, this reported an improvement, momentary episodes of CP, chronic stable SOB, doing well with Eliquis. His Entresto dose reduced given orthostatic symptoms. Not felt to be volume OL. Planned for a 3 mo visit with an EP APP and in a year with him  Pt message 04/14/22, via wife that he had a fall and was in the hospital at [facility] for it, reprted that despite Dr. Olin Pia recommendation to reduce his Delene Loll he did not and suffered a fall. At time of discharge advised to f/u with [place].  I saw him 04/30/22 He is accompanied by his wife. The day of his fainting spell, he had been to church then a church picnic, was hot out had not eaten or drank anything that morning, never did reduce his Entresto dose. When they gt home he was coming out of the bathroom and felt weak, lightheaded and fainted. His wife reports that they gave him a lot f fluids over the 2 days in the hospital and told them he was quite dehydrated.  He has not had any CP, palpitations No SOB He continues to feel a bit "foggy" They did scan his head without injury/bleeding He twisted his ankle, this is still what bothers him the most, they have seen ortho and is not broken but gave him a boot to wear but is not, he said is too uncomfortable He is not on coreg, his wife brought his home  meds. She is not sure when or why that was stopped  [name] echo noted preserved/recovered LVEF   BP was still a bit low, aldactone reduced to 12.'5mg'$  daily Kept low dose entresto Kept off coreg Planned for a 2 week visit to perhaps get him on Toprol if able  I saw him 05/29/22 He is alone today Though reports doing well Biggest issue is his gout and R shoulder pain from an old injury and arthritis, inquires if he could have a shoulder surgery if needed No near syncope or syncope. No CP, he feels like his heart is doing well Not overly active but outside of orthopedic issues, able to do his ADLs without cardiac symptoms His BP at home has been higher more often the lower or normal, getting up towards 150's/90s Planned to monitor daily BPs and if remained elevated to add small dose BB for his Afib  *** BPs? *** orthostatic? *** symptoms *** shoulder surgery He has recovered LVEF by his echo this year, patent grafts, stable CAD by his last cath 2019  RCRI score is 2, 6.6% risk *** [facility]   Device information:  BSCi dual chamber ICD, implanted 03/14/05, gen change 11/25/13 to sub-pectoral placement, Dr. Caryl Comes    Past Medical History:  Diagnosis Date   Abnormality of gait 02/07/2015   AICD (automatic cardioverter/defibrillator) present    [place]   CAD (coronary artery disease)  Hx of Inf MI treated with CABG in 1999 // Myoview 11/16: mild inf-lat ischemia, EF 34, high risk // LHC 3/19: 3/3 bypass grafts patent   Carotid artery disease ([place])    Followed by [facility] (Dr. Trula Slade) // [place] in 2/19: bilat ICA 55-73   Chronic systolic CHF (congestive heart failure) ([place]) 01/01/2010   Echo 12/30/2017: Mild LVH, EF 30-35, diffuse HK, grade 1 diastolic dysfunction, mildly reduced RVSF // Echo 04/12/2016:  EF 35-40, PASP 39, mild to moderate TR, mild LAE // Echo (12/14):  Base/mid inferior and inferolateral HK, EF 45%, mild LAE, mildly reduced RVSF   CKD (chronic kidney disease)     Depression    HLP (hyperkeratosis lenticularis perstans)    HTN (hypertension)    ICD (implantable cardiac defibrillator)  BSx    [employer]   Ischemic cardiomyopathy    Myocardial infarction ([place]) 1999   Near syncope 09/21/2014   Orthostatic hypotension    PAF (paroxysmal atrial fibrillation) ([facility])    Presence of permanent cardiac pacemaker    Wears partial dentures    upper    Past Surgical History:  Procedure Laterality Date   CARDIAC CATHETERIZATION N/A 12/11/2015   Procedure: Left Heart Cath and Coronary Angiography;  Surgeon: Charolette Forward, MD;  Location: [facility];  Service: Cardiovascular;  Laterality: N/A;   CARDIAC DEFIBRILLATOR PLACEMENT     [employer]   CARDIAC DEFIBRILLATOR PLACEMENT  04/08/2016   NOT MRI SAFE (DOCUMENT SCANNED IN SYSTEM   COLONOSCOPY  10/06/2007   Mild sigmoid diverticulosis. Small internal hemorrhoids.    CORONARY ARTERY BYPASS GRAFT     ELBOW SURGERY Left    plastic bone replacement   HERNIA REPAIR     x2   LEFT HEART CATH AND CORS/GRAFTS ANGIOGRAPHY N/A 01/07/2018   Procedure: LEFT HEART CATH AND CORS/GRAFTS ANGIOGRAPHY;  Surgeon: Burnell Blanks, MD;  Location: [place] [facility];  Service: Cardiovascular;  Laterality: N/A;   PERMANENT PACEMAKER GENERATOR CHANGE N/A 11/25/2013   Procedure: PERMANENT PACEMAKER GENERATOR CHANGE;  Surgeon: Deboraha Sprang, MD;  Location: [facility];  Service: Cardiovascular;  Laterality: N/A;    ROTATOR CUFF REPAIR Right 2006   SINUS EXPLORATION  1987   ULNAR NERVE TRANSPOSITION Left 07/19/2019   Procedure: Ulnar nerve release at the elbow with anterior transposition and flexor pronator lengthening, anterior interosseous nerve transfer to the deep motor branch of the ulnar nerve at the forearm, flexor digitorum profundus tendon transfer of the ring and small to middle fingers, ulnar nerve release at the wrist, Guyons canal;  Surgeon: Roseanne Kaufman, MD;  Location: [facility];  Service: Orthoped    ULTRASOUND GUIDANCE FOR VASCULAR ACCESS  01/07/2018   Procedure: Ultrasound Guidance For Vascular Access;  Surgeon: Burnell Blanks, MD;  Location: [place];  Service: Cardiovascular;;    Current Outpatient Medications  Medication Sig Dispense Refill   apixaban (ELIQUIS) 2.5 MG TABS tablet Take 1 tablet (2.5 mg total) by mouth 2 (two) times daily. 60 tablet 5   buPROPion (WELLBUTRIN XL) 150 MG 24 hr tablet Take 1 tablet by mouth every morning.     colchicine 0.6 MG tablet Take 0.6 mg by mouth daily.  (Patient not taking: Reported on 07/02/2022)     cyclobenzaprine (FLEXERIL) 5 MG tablet Take 5 mg by mouth 3 (three) times daily as needed for muscle spasms.     escitalopram (LEXAPRO) 10 MG tablet Take 10 mg by mouth 2 (two) times daily.  famotidine (PEPCID) 20 MG tablet Take 20 mg by mouth 2 (two) times daily.     gabapentin (NEURONTIN) 300 MG capsule Take 300 mg by mouth 3 (three) times daily.     nitroGLYCERIN (NITROSTAT) 0.4 MG SL tablet Place 1 tablet (0.4 mg total) under the tongue every 5 (five) minutes as needed for chest pain. 25 tablet 11   oxyCODONE-acetaminophen (PERCOCET) 10-325 MG tablet Take 1 tablet by mouth 4 (four) times daily as needed for pain.     rosuvastatin (CRESTOR) 40 MG tablet Take 1 tablet (40 mg total) by mouth daily. 90 tablet 2   sacubitril-valsartan (ENTRESTO) 24-26 MG Take 0.5 tablets by mouth 2 (two) times daily. 90 tablet 3   spironolactone (ALDACTONE) 25 MG tablet TAKE 1 TABLET (25 MG TOTAL) BY MOUTH DAILY AT NIGHT ONLY 90 tablet 3   traZODone (DESYREL) 50 MG tablet Take 50 mg by mouth at bedtime as needed for sleep.     No current facility-administered medications for this visit.    Allergies:   Tramadol   Social History:  The patient  reports that he quit smoking about 22 years ago. His smoking use included pipe. He has never used smokeless tobacco. He reports that he does not drink alcohol and does not use drugs.   Family History:   The patient's family history includes Heart attack in his brother, father, and mother; Heart disease in his brother, father, and mother.  ROS:  Please see the history of present illness.    All other systems are reviewed and otherwise negative.   PHYSICAL EXAM:  VS:  There were no vitals taken for this visit. BMI: There is no height or weight on file to calculate BMI. Well nourished, well developed, in no acute distress, chronically ill appearing HEENT: normocephalic, atraumatic  Neck: no JVD, carotid bruits or masses Cardiac:   *** RRR; no significant murmurs, no rubs, or gallops Lungs:   *** CTA b/l, no wheezing, rhonchi or rales  Abd: soft, nontender MS: no deformity, age appropriate atrophy Ext: *** no edema Skin: warm and dry, no rash Neuro:  No gross deficits are appreciated, speech is clear Psych: euthymic mood, full affect  *** ICD site (sub-pec) is stable, no tethering or discomfort   EKG:  not done today  ICD check today and reviewed by myself:  ***  04/13/22: TTE LVEF 55-60% LA mildly dilated  01/07/18: LHC Prox RCA lesion is 100% stenosed. SVG graft was visualized by angiography and is normal in caliber. Post Atrio lesion is 60% stenosed. Prox Cx lesion is 100% stenosed. SVG graft was visualized by angiography and is normal in caliber. LIMA graft was visualized by angiography and is normal in caliber. Ost LAD lesion is 40% stenosed. Prox LAD lesion is 40% stenosed. Prox LAD to Mid LAD lesion is 90% stenosed.   1. Severe triple vessel CAD s/p 3V CABG with 3/3 patent bypass grafts 2. Severe stenosis mid LAD. The mid and distal LAD fills from the LIMA graft 3. The mid Circumflex is chronically occluded. The OM branch fills from the patent vein graft 4. The RCA is chronically occluded just beyond the ostium. The distal RCA and PDA fills from the patent vein graft. The native PDA has moderate stenosis beyond the graft insertion which is unchanged from cardiac cath in  2017.    Recommendations: Continue medical management of CAD.   12/30/17: TTE Study Conclusions - Left ventricle: The cavity size was normal. Wall thickness was  increased in a pattern of mild LVH. Systolic function was   moderately to severely reduced. The estimated ejection fraction   was in the range of 30% to 35%. Diffuse hypokinesis. Doppler   parameters are consistent with abnormal left ventricular   relaxation (grade 1 diastolic dysfunction). - Right ventricle: Systolic function was mildly reduced.  12/11/15 LHC Conclusion     Ost RCA to Mid RCA lesion, 100% stenosed. Ost RPDA lesion, 70% stenosed. Ost Cx lesion, 60% stenosed. Prox Cx lesion, 80% stenosed. Prox Cx to Mid Cx lesion, 100% stenosed. Ost LM lesion, 40% stenosed. Prox LAD lesion, 40% stenosed. Mid LAD-1 lesion, 70% stenosed. Mid LAD-2 lesion, 100% stenosed.  Findings Left main is 30-40% diffuse disease LAD has 40-50% proximal and 70% mid stenosis and then 100% occluded filling by LIMA to LAD Left circumflex and 50-60% ostial stenosis and 80% proximal stenosis and then 100% occluded beyond the midportion. OM1 is very small is patent RCA is 100% occluded at the ostium Saphenous vein graft to PDA has 70-80% anastomotic stenosis as before at the trifurcation not suitable for PCI Saphenous vein graft to obtuse marginal was patent LIMA to LAD is patent   10/11/13: Echocardiogram Study Conclusions - Left ventricle: Moderate hypokinesis of base/mid inferior   wall and base/mid inferolateral wall. The cavity size was   normal. Wall thickness was normal. The estimated ejection   fraction was 45%. - Left atrium: The atrium was mildly dilated. - Right ventricle: The cavity size was normal. Pacer wire or   catheter noted in right ventricle. Systolic function was   mildly reduced.   Recent Labs: No results found for requested labs within last 365 days.  No results found for requested labs within last 365 days.    CrCl cannot be calculated (Patient's most recent lab result is older than the maximum 21 days allowed.).   Wt Readings from Last 3 Encounters:  07/01/22 168 lb (76.2 kg)  05/29/22 166 lb (75.3 kg)  04/30/22 166 lb (75.3 kg)     Other studies reviewed: Additional studies/records reviewed today include: summarized above   ASSESSMENT AND PLAN:  1. ICD     *** Intact function     *** No programming changes made  2. ICM 3. CHF     *** exam is euvolemic    No symptoms or exam findings of volume OL    [facility] with recovered LVEF  4. CAD     *** No anginal symptoms     On statin, nitrate     No ASA w/Eliquis      5. Paroxysmal AFib      CHA2DS2Vasc is 6, on eliquis, *** appropriately dosed      ***   6. Carotid disease     Dr. Trula Slade  7.  HTN 8. Orthostatic history, recent syncope Not recurrent ***   9.  Pre-op, shoulder surgery ***  Disposition: ***    Current medicines are reviewed at length with the patient today.  The patient did not have any concerns regarding medicines.  Haywood Lasso, PA-C 07/21/2022 8:35 AM     [address] [place] [address] [phone] (office)  [phone] (fax)

## 2022-07-21 NOTE — Progress Notes (Unsigned)
HISTORY AND PHYSICAL     CC:  follow up. Requesting Provider:  Carvel Getting Key, *  HPI: This is a 81 y.o. male who is here today for follow up.    Pt was last seen on 07/01/2022.  At that time, he was seen for routine surveillance for carotid disease.  His carotid duplex revealed 40-59% bilateral ICA stenosis.  He was also found to have a wound b/w his left 4th and 5th toes that was present for about 3 months.  He had started using prescription strength abx ointment and felt it was improving.  He had palpable AT pulse but [place] vessels on ABI.  Pt was against any invasive testing/intervention.  It was recommended to do daily soap and water soaks and f/u in 3 weeks for wound check.    The pt returns today for follow up with his wife of 40 years.  He tells me his wound on his left foot is getting better.   He is not having any pain in the left foot.   Pt denies any amaurosis fugax, speech difficulties, weakness, numbness, paralysis or clumsiness or facial droop.    Pt denies claudication, rest pain.    His wife inquires if he is stable for his shoulder surgery.   Pt has hx of inferior MI and subsequent CABG in 1999, hx of heart failure with an EF of 30-35%. with AICD, htn, hyperlipidemia, CKD and PAF and is followed by [facility].   He does have orthostatic hypotension.   The pt is on a statin for cholesterol management.    The pt is not on an aspirin.    Other AC:  Eliquis The pt is on ARB, spironolactone for hypertension.  The pt does not have diabetes. Tobacco hx:  former     Past Medical History:  Diagnosis Date   Abnormality of gait 02/07/2015   AICD (automatic cardioverter/defibrillator) present    [place]   CAD (coronary artery disease)    Hx of Inf MI treated with CABG in 1999 // Myoview 11/16: mild inf-lat ischemia, EF 34, high risk // LHC 3/19: 3/3 bypass grafts patent   Carotid artery disease ([place])    Followed by [facility] (Dr. Trula Slade) // [place] in  2/19: bilat ICA 50-09   Chronic systolic CHF (congestive heart failure) ([place]) 01/01/2010   Echo 12/30/2017: Mild LVH, EF 30-35, diffuse HK, grade 1 diastolic dysfunction, mildly reduced RVSF // Echo 04/12/2016:  EF 35-40, PASP 39, mild to moderate TR, mild LAE // Echo (12/14):  Base/mid inferior and inferolateral HK, EF 45%, mild LAE, mildly reduced RVSF   CKD (chronic kidney disease)    Depression    HLP (hyperkeratosis lenticularis perstans)    HTN (hypertension)    ICD (implantable cardiac defibrillator)  BSx    [employer]   Ischemic cardiomyopathy    Myocardial infarction ([place]) 1999   Near syncope 09/21/2014   Orthostatic hypotension    PAF (paroxysmal atrial fibrillation) (HCC)    Presence of permanent cardiac pacemaker    Wears partial dentures    upper    Past Surgical History:  Procedure Laterality Date   CARDIAC CATHETERIZATION N/A 12/11/2015   Procedure: Left Heart Cath and Coronary Angiography;  Surgeon: Charolette Forward, MD;  Location: [facility];  Service: Cardiovascular;  Laterality: N/A;   CARDIAC DEFIBRILLATOR PLACEMENT     [employer]   CARDIAC DEFIBRILLATOR PLACEMENT  04/08/2016   NOT MRI SAFE (DOCUMENT SCANNED  IN SYSTEM   COLONOSCOPY  10/06/2007   Mild sigmoid diverticulosis. Small internal hemorrhoids.    CORONARY ARTERY BYPASS GRAFT     ELBOW SURGERY Left    plastic bone replacement   HERNIA REPAIR     x2   LEFT HEART CATH AND CORS/GRAFTS ANGIOGRAPHY N/A 01/07/2018   Procedure: LEFT HEART CATH AND CORS/GRAFTS ANGIOGRAPHY;  Surgeon: Burnell Blanks, MD;  Location: [facility];  Service: Cardiovascular;  Laterality: N/A;   PERMANENT PACEMAKER GENERATOR CHANGE N/A 11/25/2013   Procedure: PERMANENT PACEMAKER GENERATOR CHANGE;  Surgeon: Deboraha Sprang, MD;  Location: [facility];  Service: Cardiovascular;  Laterality: N/A;    ROTATOR CUFF REPAIR Right 2006   SINUS EXPLORATION  1987   ULNAR NERVE TRANSPOSITION Left 07/19/2019   Procedure:  Ulnar nerve release at the elbow with anterior transposition and flexor pronator lengthening, anterior interosseous nerve transfer to the deep motor branch of the ulnar nerve at the forearm, flexor digitorum profundus tendon transfer of the ring and small to middle fingers, ulnar nerve release at the wrist, Guyons canal;  Surgeon: Roseanne Kaufman, MD;  Location: [facility];  Service: Orthoped   ULTRASOUND GUIDANCE FOR VASCULAR ACCESS  01/07/2018   Procedure: Ultrasound Guidance For Vascular Access;  Surgeon: Burnell Blanks, MD;  Location: [facility];  Service: Cardiovascular;;    Allergies  Allergen Reactions   Tramadol Other (See Comments)    Unknown     Current Outpatient Medications  Medication Sig Dispense Refill   apixaban (ELIQUIS) 2.5 MG TABS tablet Take 1 tablet (2.5 mg total) by mouth 2 (two) times daily. 60 tablet 5   buPROPion (WELLBUTRIN XL) 150 MG 24 hr tablet Take 1 tablet by mouth every morning.     colchicine 0.6 MG tablet Take 0.6 mg by mouth daily.  (Patient not taking: Reported on 07/02/2022)     cyclobenzaprine (FLEXERIL) 5 MG tablet Take 5 mg by mouth 3 (three) times daily as needed for muscle spasms.     escitalopram (LEXAPRO) 10 MG tablet Take 10 mg by mouth 2 (two) times daily.     famotidine (PEPCID) 20 MG tablet Take 20 mg by mouth 2 (two) times daily.     gabapentin (NEURONTIN) 300 MG capsule Take 300 mg by mouth 3 (three) times daily.     nitroGLYCERIN (NITROSTAT) 0.4 MG SL tablet Place 1 tablet (0.4 mg total) under the tongue every 5 (five) minutes as needed for chest pain. 25 tablet 11   oxyCODONE-acetaminophen (PERCOCET) 10-325 MG tablet Take 1 tablet by mouth 4 (four) times daily as needed for pain.     rosuvastatin (CRESTOR) 40 MG tablet Take 1 tablet (40 mg total) by mouth daily. 90 tablet 2   sacubitril-valsartan (ENTRESTO) 24-26 MG Take 0.5 tablets by mouth 2 (two) times daily. 90 tablet 3   spironolactone (ALDACTONE) 25 MG tablet TAKE 1 TABLET (25  MG TOTAL) BY MOUTH DAILY AT NIGHT ONLY 90 tablet 3   traZODone (DESYREL) 50 MG tablet Take 50 mg by mouth at bedtime as needed for sleep.     No current facility-administered medications for this visit.    Family History  Problem Relation Age of Onset   Heart disease Mother    Heart attack Mother    Heart disease Father    Heart attack Father    Heart disease Brother    Heart attack Brother     Social History   Socioeconomic History   Marital  status: Married    Spouse name: Not on file   Number of children: 1   Years of education: HS +   Highest education level: Not on file  Occupational History   Occupation: retired  Tobacco Use   Smoking status: Former    Types: Pipe    Quit date: 09/19/1999    Years since quitting: 22.8   Smokeless tobacco: Never   Tobacco comments:    quit in [place] Use   Vaping Use: Never used  Substance and Sexual Activity   Alcohol use: No    Alcohol/week: 0.0 standard drinks of alcohol    Comment: recovering alcoholic, quit 86VHQ ago (2000)   Drug use: No   Sexual activity: Not on file  Other Topics Concern   Not on file  Social History Narrative   Lives at home w/ his wife.   Patient is right handed.   Patient drinks about 3 cups of soda daily.   Social Determinants of Health   Financial Resource Strain: Not on file  Food Insecurity: Not on file  Transportation Needs: Not on file  Physical Activity: Not on file  Stress: Not on file  Social Connections: Not on file  Intimate Partner Violence: Not on file     REVIEW OF SYSTEMS:   '[X]'$  denotes positive finding, '[ ]'$  denotes negative finding Cardiac  Comments:  Chest pain or chest pressure:    Shortness of breath upon exertion:    Short of breath when lying flat:    Irregular heart rhythm:        Vascular    Pain in calf, thigh, or hip brought on by ambulation:    Pain in feet at night that wakes you up from your sleep:     Blood clot in your veins:    Leg swelling:          Pulmonary    Oxygen at home:    Wheezing:         Neurologic    Sudden weakness in arms or legs:     Sudden numbness in arms or legs:     Sudden onset of difficulty speaking or understanding others    Temporary loss of vision in one eye:     Problems with dizziness:         Gastrointestinal    Blood in stool:     Vomited blood:         Genitourinary    Burning when urinating:     Blood in urine:        Psychiatric    Major depression:         Hematologic    Bleeding problems:    Problems with blood clotting too easily:        Skin    Rashes or ulcers:        Constitutional    Fever or chills:      PHYSICAL EXAMINATION:  Today's Vitals   07/22/22 1427  BP: 99/70  Pulse: 82  Resp: 20  Temp: (!) 97.5 F (36.4 C)  TempSrc: Temporal  SpO2: 96%  Weight: 158 lb 4.8 oz (71.8 kg)  Height: '5\' 10"'$  (1.778 m)   Body mass index is 22.71 kg/m.   General:  WDWN in NAD; vital signs documented above Gait: Not observed HENT: WNL, normocephalic Pulmonary: normal non-labored breathing  Cardiac: regular HR;  without carotid bruits Skin: without rashes Extremities: palpable bilateral radial pulses.    Musculoskeletal: no  muscle wasting or atrophy  Neurologic: A&O X 3;  speech is fluent/normal; moving all extremities equally  Psychiatric:  The pt has Normal affect.   Non-Invasive Vascular Imaging:   ABI's/TBI's on 07/01/2022: Right:  [place]/0.60 - great toe pressure:  86 Left:  [place]/0.85 - great toe pressure:  123  Non-Invasive Vascular Imaging:   Carotid Duplex on 07/01/2022: Right:  40-59% ICA stenosis Left:  40-59% ICA stenosis    ASSESSMENT/PLAN:: 81 y.o. male here for follow up for wound check left foot.     PAD -pt had ABI a few weeks ago that revealed [place] vessels.  -his wound today appears to be improved and drying up compared to photo a few weeks ago.  Encouraged them to continue taking care of wound. -pt does not have rest pain,  claudication -continue graduated walking program -pt will f/u in one year with ABI-if his wound worsens, they will call back sooner to be seen.   -pt has palpable radial pulses bilaterally.  Discussed with pt and his wife that we cannot clear him from cardiology standpoint for his shoulder surgery.  They have upcoming appt with cards.    Carotid stenosis -duplex a few weeks ago revealed 40-59% bilateral ICA stenosis.   He remains asymptomatic. -discussed s/s of stroke with pt and he understands should he develop any of these sx, he will go to the nearest ER or call 911.  -pt will f/u in one year with carotid duplex  -continue statin.  Pt also on Eliquis.   Leontine Locket, [facility] [phone]  Clinic MD:   Carlis Abbott

## 2022-07-22 ENCOUNTER — Ambulatory Visit (INDEPENDENT_AMBULATORY_CARE_PROVIDER_SITE_OTHER): Payer: Medicare PPO | Admitting: Physician Assistant

## 2022-07-22 VITALS — BP 99/70 | HR 82 | Temp 97.5°F | Resp 20 | Ht 70.0 in | Wt 158.3 lb

## 2022-07-22 DIAGNOSIS — S91302A Unspecified open wound, left foot, initial encounter: Secondary | ICD-10-CM | POA: Diagnosis not present

## 2022-07-22 DIAGNOSIS — I739 Peripheral vascular disease, unspecified: Secondary | ICD-10-CM | POA: Diagnosis not present

## 2022-07-23 ENCOUNTER — Encounter: Payer: Self-pay | Admitting: Physician Assistant

## 2022-07-23 ENCOUNTER — Ambulatory Visit: Payer: Medicare PPO | Attending: Physician Assistant | Admitting: Physician Assistant

## 2022-07-23 VITALS — BP 98/50 | HR 70 | Ht 70.0 in | Wt 159.0 lb

## 2022-07-23 DIAGNOSIS — I10 Essential (primary) hypertension: Secondary | ICD-10-CM

## 2022-07-23 DIAGNOSIS — I2510 Atherosclerotic heart disease of native coronary artery without angina pectoris: Secondary | ICD-10-CM

## 2022-07-23 DIAGNOSIS — Z01818 Encounter for other preprocedural examination: Secondary | ICD-10-CM | POA: Diagnosis not present

## 2022-07-23 DIAGNOSIS — I480 Paroxysmal atrial fibrillation: Secondary | ICD-10-CM

## 2022-07-23 DIAGNOSIS — I739 Peripheral vascular disease, unspecified: Secondary | ICD-10-CM

## 2022-07-23 DIAGNOSIS — Z79899 Other long term (current) drug therapy: Secondary | ICD-10-CM

## 2022-07-23 DIAGNOSIS — Z95810 Presence of automatic (implantable) cardiac defibrillator: Secondary | ICD-10-CM

## 2022-07-23 DIAGNOSIS — I5022 Chronic systolic (congestive) heart failure: Secondary | ICD-10-CM | POA: Diagnosis not present

## 2022-07-23 DIAGNOSIS — I951 Orthostatic hypotension: Secondary | ICD-10-CM | POA: Diagnosis not present

## 2022-07-23 LAB — CUP PACEART INCLINIC DEVICE CHECK
Date Time Interrogation Session: 20230920171709
HighPow Impedance: 47 Ohm
Implantable Lead Implant Date: 20060512
Implantable Lead Implant Date: 20060512
Implantable Lead Location: 753859
Implantable Lead Location: 753860
Implantable Lead Model: 158
Implantable Lead Model: 5076
Implantable Lead Serial Number: 159477
Implantable Pulse Generator Implant Date: 20150123
Lead Channel Impedance Value: 407 Ohm
Lead Channel Impedance Value: 673 Ohm
Lead Channel Pacing Threshold Amplitude: 0.6 V
Lead Channel Pacing Threshold Amplitude: 0.9 V
Lead Channel Pacing Threshold Pulse Width: 0.4 ms
Lead Channel Pacing Threshold Pulse Width: 0.4 ms
Lead Channel Sensing Intrinsic Amplitude: 10.8 mV
Lead Channel Sensing Intrinsic Amplitude: 2.3 mV
Lead Channel Setting Pacing Amplitude: 2 V
Lead Channel Setting Pacing Amplitude: 2.4 V
Lead Channel Setting Pacing Pulse Width: 0.4 ms
Lead Channel Setting Sensing Sensitivity: 0.6 mV
Pulse Gen Serial Number: 112776

## 2022-07-23 NOTE — Patient Instructions (Signed)
Medication Instructions:   Your physician recommends that you continue on your current medications as directed. Please refer to the Current Medication list given to you today.  *If you need a refill on your cardiac medications before your next appointment, please call your pharmacy*   Lab Work: [place]   If you have labs (blood work) drawn today and your tests are completely normal, you will receive your results only by: [place] (if you have MyChart) OR A paper copy in the mail If you have any lab test that is abnormal or we need to change your treatment, we will call you to review the results.   Testing/Procedures: NONE ORDERED  TODAY     Follow-Up: At [facility], you and your health needs are our priority.  As part of our continuing mission to provide you with exceptional heart care, we have created designated Provider Care Teams.  These Care Teams include your primary Cardiologist (physician) and Advanced Practice Providers (APPs -  Physician Assistants and Nurse Practitioners) who all work together to provide you with the care you need, when you need it.  We recommend signing up for the patient portal called "MyChart".  Sign up information is provided on this After Visit Summary.  MyChart is used to connect with patients for Virtual Visits (Telemedicine).  Patients are able to view lab/test results, encounter notes, upcoming appointments, etc.  Non-urgent messages can be sent to your provider as well.   To learn more about what you can do with MyChart, go to [email or web address].    Your next appointment:   4 month(s)  The format for your next appointment:   In Person  Provider:   You may see Virl Axe, MD    Other Instructions     Important Information About Sugar

## 2022-07-24 LAB — CBC
Hematocrit: 36.6 % — ABNORMAL LOW (ref 37.5–51.0)
Hemoglobin: 12.5 g/dL — ABNORMAL LOW (ref 13.0–17.7)
MCH: 33.9 pg — ABNORMAL HIGH (ref 26.6–33.0)
MCHC: 34.2 g/dL (ref 31.5–35.7)
MCV: 99 fL — ABNORMAL HIGH (ref 79–97)
Platelets: 177 10*3/uL (ref 150–450)
RBC: 3.69 x10E6/uL — ABNORMAL LOW (ref 4.14–5.80)
RDW: 12.4 % (ref 11.6–15.4)
WBC: 6.7 10*3/uL (ref 3.4–10.8)

## 2022-07-24 LAB — BASIC METABOLIC PANEL
BUN/Creatinine Ratio: 12 (ref 10–24)
BUN: 24 mg/dL (ref 8–27)
CO2: 21 mmol/L (ref 20–29)
Calcium: 9.2 mg/dL (ref 8.6–10.2)
Chloride: 106 mmol/L (ref 96–106)
Creatinine, Ser: 2.01 mg/dL — ABNORMAL HIGH (ref 0.76–1.27)
Glucose: 100 mg/dL — ABNORMAL HIGH (ref 70–99)
Potassium: 4.6 mmol/L (ref 3.5–5.2)
Sodium: 142 mmol/L (ref 134–144)
eGFR: 33 mL/min/{1.73_m2} — ABNORMAL LOW (ref >59)

## 2022-08-06 ENCOUNTER — Ambulatory Visit: Payer: Medicare PPO | Admitting: Cardiovascular Disease

## 2022-08-06 ENCOUNTER — Encounter: Payer: Self-pay | Admitting: Cardiovascular Disease

## 2022-08-06 ENCOUNTER — Ambulatory Visit: Payer: Medicare PPO | Attending: Cardiovascular Disease | Admitting: Cardiovascular Disease

## 2022-08-06 DIAGNOSIS — E782 Mixed hyperlipidemia: Secondary | ICD-10-CM

## 2022-08-06 DIAGNOSIS — I480 Paroxysmal atrial fibrillation: Secondary | ICD-10-CM

## 2022-08-06 DIAGNOSIS — I6523 Occlusion and stenosis of bilateral carotid arteries: Secondary | ICD-10-CM

## 2022-08-06 DIAGNOSIS — I10 Essential (primary) hypertension: Secondary | ICD-10-CM

## 2022-08-06 DIAGNOSIS — Z95810 Presence of automatic (implantable) cardiac defibrillator: Secondary | ICD-10-CM

## 2022-08-06 DIAGNOSIS — I779 Disorder of arteries and arterioles, unspecified: Secondary | ICD-10-CM | POA: Insufficient documentation

## 2022-08-06 NOTE — Assessment & Plan Note (Signed)
History of essential hypertension blood pressure measured today at 102/60.  He is on Entresto.

## 2022-08-06 NOTE — Progress Notes (Signed)
08/06/2022 Connor Wilkerson   [birth year]  [id number]  Primary Physician Marga Hoots, NP Primary Cardiologist: Lorretta Harp MD Lupe Carney, [place]  HPI:  Connor Wilkerson is a 81 y.o. thin-appearing married Caucasian male father of 8, grandfather 2 grandchildren accompanied by his wife [name] today.  He was referred for preoperative clearance before right shoulder replacement by Dr. Alma Friendly.  He is followed by Dr. Caryl Comes for his ICD.  He has a history of ischemic cardiomyopathy in the past.  He had CABG in 1999.  Other problems include treated hypertension and hyperlipidemia.  He smoked remotely having quit 15 to 16 years ago and quit drinking at the same time.  He had a cardiac catheterization performed by Dr. Terrence Dupont 12/11/2015 revealing patent grafts and again by Dr. Angelena Form 01/07/2018 again revealing patent grafts.  Recent 2D echo performed 04/13/2022 revealed normal LV systolic function.  He is asymptomatic.  I believe he is at low risk for undergoing right shoulder replacement.   Current Meds  Medication Sig   apixaban (ELIQUIS) 2.5 MG TABS tablet Take 1 tablet (2.5 mg total) by mouth 2 (two) times daily.   buPROPion (WELLBUTRIN XL) 150 MG 24 hr tablet Take 1 tablet by mouth every morning.   colchicine 0.6 MG tablet Take 0.6 mg by mouth daily.   cyclobenzaprine (FLEXERIL) 5 MG tablet Take 5 mg by mouth 3 (three) times daily as needed for muscle spasms.   escitalopram (LEXAPRO) 10 MG tablet Take 10 mg by mouth 2 (two) times daily.   famotidine (PEPCID) 20 MG tablet Take 20 mg by mouth 2 (two) times daily.   gabapentin (NEURONTIN) 300 MG capsule Take 300 mg by mouth 3 (three) times daily.   nitroGLYCERIN (NITROSTAT) 0.4 MG SL tablet Place 1 tablet (0.4 mg total) under the tongue every 5 (five) minutes as needed for chest pain.   oxyCODONE-acetaminophen (PERCOCET) 10-325 MG tablet Take 1 tablet by mouth 4 (four) times daily as needed for pain.   rosuvastatin (CRESTOR) 40 MG  tablet Take 1 tablet (40 mg total) by mouth daily.   sacubitril-valsartan (ENTRESTO) 24-26 MG Take 0.5 tablets by mouth 2 (two) times daily.   spironolactone (ALDACTONE) 25 MG tablet TAKE 1 TABLET (25 MG TOTAL) BY MOUTH DAILY AT NIGHT ONLY   traZODone (DESYREL) 50 MG tablet Take 50 mg by mouth at bedtime as needed for sleep.     Allergies  Allergen Reactions   Tramadol Other (See Comments)    Unknown     Social History   Socioeconomic History   Marital status: Married    Spouse name: Not on file   Number of children: 1   Years of education: HS +   Highest education level: Not on file  Occupational History   Occupation: retired  Tobacco Use   Smoking status: Former    Types: Pipe    Quit date: 09/19/1999    Years since quitting: 22.8    Passive exposure: Never   Smokeless tobacco: Never   Tobacco comments:    quit in 1998  Vaping Use   Vaping Use: Never used  Substance and Sexual Activity   Alcohol use: No    Alcohol/week: 0.0 standard drinks of alcohol    Comment: recovering alcoholic, quit 62BJS ago (2000)   Drug use: No   Sexual activity: Not on file  Other Topics Concern   Not on file  Social History Narrative   Lives at home w/ his  wife.   Patient is right handed.   Patient drinks about 3 cups of soda daily.   Social Determinants of Health   Financial Resource Strain: Not on file  Food Insecurity: Not on file  Transportation Needs: Not on file  Physical Activity: Not on file  Stress: Not on file  Social Connections: Not on file  Intimate Partner Violence: Not on file     Review of Systems: General: negative for chills, fever, night sweats or weight changes.  Cardiovascular: negative for chest pain, dyspnea on exertion, edema, orthopnea, palpitations, paroxysmal nocturnal dyspnea or shortness of breath Dermatological: negative for rash Respiratory: negative for cough or wheezing Urologic: negative for hematuria Abdominal: negative for nausea,  vomiting, diarrhea, bright red blood per rectum, melena, or hematemesis Neurologic: negative for visual changes, syncope, or dizziness All other systems reviewed and are otherwise negative except as noted above.    Blood pressure 102/60, pulse 68, height '5\' 10"'$  (1.778 m), weight 158 lb (71.7 kg).  General appearance: alert and no distress Neck: no adenopathy, no carotid bruit, no JVD, supple, symmetrical, trachea midline, and thyroid not enlarged, symmetric, no tenderness/mass/nodules Lungs: clear to auscultation bilaterally Heart: regular rate and rhythm, S1, S2 normal, no murmur, click, rub or gallop Extremities: extremities normal, atraumatic, no cyanosis or edema Pulses: Diminished pedal pulses Skin: Skin color, texture, turgor normal. No rashes or lesions Neurologic: Grossly normal  EKG sinus rhythm at 68 with nonspecific ST and T wave changes.  Personally reviewed this EKG.  ASSESSMENT AND PLAN:   CARDIOMYOPATHY, ISCHEMIC History of ischemic heart myopathy on guideline directed ox medical therapy with recent echo performed in [place] 04/13/2022 revealing normal LV systolic function.  Implantable cardioverter-defibrillator (ICD) in situ History of ICD implantation followed by Dr. Caryl Comes.  Essential hypertension History of essential hypertension blood pressure measured today at 102/60.  He is on Entresto.  Hyperlipidemia History of hyperlipidemia on statin therapy with lipid profile performed 07/02/2022 revealing total cholesterol 108, LDL 51 and HDL 37.  Paroxysmal atrial fibrillation (HCC) History of PAF maintaining sinus rhythm on Eliquis oral anticoagulation.  Carotid artery disease ([place]) Recent carotid Dopplers performed 06/11/2022 revealed moderate bilateral ICA stenosis.  This will be repeated on an annual basis.     Lorretta Harp MD FACP,FACC,FAHA, [facility] 08/06/2022 2:35 PM

## 2022-08-06 NOTE — Patient Instructions (Signed)
Medication Instructions:  Your physician recommends that you continue on your current medications as directed. Please refer to the Current Medication list given to you today.  *If you need a refill on your cardiac medications before your next appointment, please call your pharmacy*   Follow-Up: At [facility], you and your health needs are our priority.  As part of our continuing mission to provide you with exceptional heart care, we have created designated Provider Care Teams.  These Care Teams include your primary Cardiologist (physician) and Advanced Practice Providers (APPs -  Physician Assistants and Nurse Practitioners) who all work together to provide you with the care you need, when you need it.  We recommend signing up for the patient portal called "MyChart".  Sign up information is provided on this After Visit Summary.  MyChart is used to connect with patients for Virtual Visits (Telemedicine).  Patients are able to view lab/test results, encounter notes, upcoming appointments, etc.  Non-urgent messages can be sent to your provider as well.   To learn more about what you can do with MyChart, go to [email or web address].    Your next appointment:   12 month(s)  The format for your next appointment:   In Person  Provider:   Jonathan Berry, MD   

## 2022-08-06 NOTE — Assessment & Plan Note (Signed)
History of ischemic heart myopathy on guideline directed ox medical therapy with recent echo performed in [place] 04/13/2022 revealing normal LV systolic function.

## 2022-08-06 NOTE — Assessment & Plan Note (Signed)
History of ICD implantation followed by Dr. Caryl Comes.

## 2022-08-06 NOTE — Assessment & Plan Note (Signed)
History of PAF maintaining sinus rhythm on Eliquis oral anticoagulation. 

## 2022-08-06 NOTE — Assessment & Plan Note (Signed)
History of hyperlipidemia on statin therapy with lipid profile performed 07/02/2022 revealing total cholesterol 108, LDL 51 and HDL 37.

## 2022-08-06 NOTE — Assessment & Plan Note (Signed)
Recent carotid Dopplers performed 06/11/2022 revealed moderate bilateral ICA stenosis.  This will be repeated on an annual basis.

## 2022-08-13 ENCOUNTER — Telehealth: Payer: Self-pay | Admitting: *Deleted

## 2022-08-13 DIAGNOSIS — G4700 Insomnia, unspecified: Secondary | ICD-10-CM | POA: Diagnosis not present

## 2022-08-13 DIAGNOSIS — F419 Anxiety disorder, unspecified: Secondary | ICD-10-CM | POA: Diagnosis not present

## 2022-08-13 DIAGNOSIS — G2581 Restless legs syndrome: Secondary | ICD-10-CM | POA: Diagnosis not present

## 2022-08-13 DIAGNOSIS — Z6823 Body mass index (BMI) 23.0-23.9, adult: Secondary | ICD-10-CM | POA: Diagnosis not present

## 2022-08-13 DIAGNOSIS — I255 Ischemic cardiomyopathy: Secondary | ICD-10-CM | POA: Diagnosis not present

## 2022-08-13 DIAGNOSIS — Z7901 Long term (current) use of anticoagulants: Secondary | ICD-10-CM | POA: Diagnosis not present

## 2022-08-13 DIAGNOSIS — E7800 Pure hypercholesterolemia, unspecified: Secondary | ICD-10-CM | POA: Diagnosis not present

## 2022-08-13 DIAGNOSIS — I219 Acute myocardial infarction, unspecified: Secondary | ICD-10-CM | POA: Diagnosis not present

## 2022-08-13 DIAGNOSIS — N189 Chronic kidney disease, unspecified: Secondary | ICD-10-CM | POA: Diagnosis not present

## 2022-08-13 NOTE — Telephone Encounter (Signed)
   Pre-operative Risk Assessment    Patient Name: Connor Wilkerson  DOB: 09-Feb-1941 MRN: [id number]      Request for Surgical Clearance    Procedure:   RIGHT REVERSE TOTAL SHOULDER  Date of Surgery:  Clearance TBD                                 Surgeon:  DR. Esmond Plants Surgeon's Group or Practice Name:  [facility] Phone number:  [phone] Fax number:  [phone] ATTN: Caryl Pina [place]   Type of Clearance Requested:   - Medical  - Pharmacy:  Hold Apixaban (Eliquis)     Type of Anesthesia:  General    Additional requests/questions:    Jiles Prows   08/13/2022, 2:32 PM

## 2022-08-19 DIAGNOSIS — M25511 Pain in right shoulder: Secondary | ICD-10-CM | POA: Diagnosis not present

## 2022-08-19 DIAGNOSIS — N183 Chronic kidney disease, stage 3 unspecified: Secondary | ICD-10-CM | POA: Diagnosis not present

## 2022-08-19 DIAGNOSIS — M5136 Other intervertebral disc degeneration, lumbar region: Secondary | ICD-10-CM | POA: Diagnosis not present

## 2022-08-20 NOTE — Telephone Encounter (Signed)
Patient with diagnosis of PAF on Eliquis for anticoagulation.    Procedure: RIGHT REVERSE TOTAL SHOULDER   Date of procedure: TBD   CHA2DS2-VASc Score = 4   This indicates a 4.8% annual risk of stroke. The patient's score is based upon: CHF History: 0 HTN History: 1 Diabetes History: 0 Stroke History: 0 Vascular Disease History: 1 Age Score: 2 Gender Score: 0    CrCl 29 ml/min (Scr 2.01 07/23/2022) Platelet count 177   Per office protocol, patient can hold Eliquis for 3 days prior to procedure.     **This guidance is not considered finalized until pre-operative APP has relayed final recommendations.**

## 2022-08-22 NOTE — Telephone Encounter (Addendum)
     Primary Cardiologist: Virl Axe, MD  Chart reviewed as part of pre-operative protocol coverage. Given past medical history and time since last visit, based on ACC/AHA guidelines, DEONTREY MASSI would be at acceptable risk for the planned procedure without further cardiovascular testing.   From a cardiac standpoint he is low risk for the planned surgery.  Patient with diagnosis of PAF on Eliquis for anticoagulation.     Procedure: RIGHT REVERSE TOTAL SHOULDER   Date of procedure: TBD     CHA2DS2-VASc Score = 4   This indicates a 4.8% annual risk of stroke. The patient's score is based upon: CHF History: 0 HTN History: 1 Diabetes History: 0 Stroke History: 0 Vascular Disease History: 1 Age Score: 2 Gender Score: 0     CrCl 29 ml/min (Scr 2.01 07/23/2022) Platelet count 177     Per office protocol, patient can hold Eliquis for 3 days prior to procedure.  I will route this recommendation to the requesting party via Epic fax function and remove from pre-op pool.  Please call with questions.  Jossie Ng. Winna Golla NP-C     08/22/2022, 11:00 AM [address] [place] Office [phone] Fax [phone]

## 2022-09-03 DIAGNOSIS — M25511 Pain in right shoulder: Secondary | ICD-10-CM | POA: Diagnosis not present

## 2022-09-10 DIAGNOSIS — Z23 Encounter for immunization: Secondary | ICD-10-CM | POA: Diagnosis not present

## 2022-09-28 DIAGNOSIS — L251 Unspecified contact dermatitis due to drugs in contact with skin: Secondary | ICD-10-CM | POA: Diagnosis not present

## 2022-09-28 DIAGNOSIS — I10 Essential (primary) hypertension: Secondary | ICD-10-CM | POA: Diagnosis not present

## 2022-09-28 DIAGNOSIS — N289 Disorder of kidney and ureter, unspecified: Secondary | ICD-10-CM | POA: Diagnosis not present

## 2022-09-28 DIAGNOSIS — R079 Chest pain, unspecified: Secondary | ICD-10-CM | POA: Diagnosis not present

## 2022-09-28 DIAGNOSIS — I219 Acute myocardial infarction, unspecified: Secondary | ICD-10-CM | POA: Diagnosis not present

## 2022-09-28 DIAGNOSIS — S20319A Abrasion of unspecified front wall of thorax, initial encounter: Secondary | ICD-10-CM | POA: Diagnosis not present

## 2022-09-28 DIAGNOSIS — I498 Other specified cardiac arrhythmias: Secondary | ICD-10-CM | POA: Diagnosis not present

## 2022-09-28 DIAGNOSIS — I509 Heart failure, unspecified: Secondary | ICD-10-CM | POA: Diagnosis not present

## 2022-09-28 DIAGNOSIS — R1032 Left lower quadrant pain: Secondary | ICD-10-CM | POA: Diagnosis not present

## 2022-09-28 DIAGNOSIS — S299XXA Unspecified injury of thorax, initial encounter: Secondary | ICD-10-CM | POA: Diagnosis not present

## 2022-10-06 DIAGNOSIS — N2889 Other specified disorders of kidney and ureter: Secondary | ICD-10-CM | POA: Diagnosis not present

## 2022-10-06 DIAGNOSIS — Z6824 Body mass index (BMI) 24.0-24.9, adult: Secondary | ICD-10-CM | POA: Diagnosis not present

## 2022-10-06 DIAGNOSIS — S299XXA Unspecified injury of thorax, initial encounter: Secondary | ICD-10-CM | POA: Diagnosis not present

## 2022-11-05 DIAGNOSIS — Z125 Encounter for screening for malignant neoplasm of prostate: Secondary | ICD-10-CM | POA: Diagnosis not present

## 2022-11-05 DIAGNOSIS — Z79899 Other long term (current) drug therapy: Secondary | ICD-10-CM | POA: Diagnosis not present

## 2022-11-05 DIAGNOSIS — N401 Enlarged prostate with lower urinary tract symptoms: Secondary | ICD-10-CM | POA: Diagnosis not present

## 2022-11-05 DIAGNOSIS — N289 Disorder of kidney and ureter, unspecified: Secondary | ICD-10-CM | POA: Diagnosis not present

## 2022-11-12 DIAGNOSIS — Z6823 Body mass index (BMI) 23.0-23.9, adult: Secondary | ICD-10-CM | POA: Diagnosis not present

## 2022-11-12 DIAGNOSIS — G2581 Restless legs syndrome: Secondary | ICD-10-CM | POA: Diagnosis not present

## 2022-11-12 DIAGNOSIS — I219 Acute myocardial infarction, unspecified: Secondary | ICD-10-CM | POA: Diagnosis not present

## 2022-11-12 DIAGNOSIS — Z9181 History of falling: Secondary | ICD-10-CM | POA: Diagnosis not present

## 2022-11-12 DIAGNOSIS — E7800 Pure hypercholesterolemia, unspecified: Secondary | ICD-10-CM | POA: Diagnosis not present

## 2022-11-12 DIAGNOSIS — N189 Chronic kidney disease, unspecified: Secondary | ICD-10-CM | POA: Diagnosis not present

## 2022-11-12 DIAGNOSIS — I255 Ischemic cardiomyopathy: Secondary | ICD-10-CM | POA: Diagnosis not present

## 2022-11-13 ENCOUNTER — Other Ambulatory Visit (HOSPITAL_COMMUNITY): Payer: Self-pay | Admitting: Internal Medicine

## 2022-11-13 DIAGNOSIS — I480 Paroxysmal atrial fibrillation: Secondary | ICD-10-CM

## 2022-11-24 ENCOUNTER — Encounter: Payer: Medicare PPO | Admitting: Internal Medicine

## 2022-12-03 DIAGNOSIS — E7800 Pure hypercholesterolemia, unspecified: Secondary | ICD-10-CM | POA: Diagnosis not present

## 2022-12-03 DIAGNOSIS — N189 Chronic kidney disease, unspecified: Secondary | ICD-10-CM | POA: Diagnosis not present

## 2022-12-11 ENCOUNTER — Encounter: Payer: Self-pay | Admitting: Internal Medicine

## 2022-12-11 ENCOUNTER — Ambulatory Visit: Payer: Medicare PPO | Attending: Internal Medicine | Admitting: Internal Medicine

## 2022-12-11 VITALS — BP 160/78 | HR 60 | Ht 70.0 in | Wt 161.8 lb

## 2022-12-11 DIAGNOSIS — I480 Paroxysmal atrial fibrillation: Secondary | ICD-10-CM | POA: Diagnosis not present

## 2022-12-11 DIAGNOSIS — I5022 Chronic systolic (congestive) heart failure: Secondary | ICD-10-CM

## 2022-12-11 DIAGNOSIS — Z95810 Presence of automatic (implantable) cardiac defibrillator: Secondary | ICD-10-CM | POA: Diagnosis not present

## 2022-12-11 DIAGNOSIS — I951 Orthostatic hypotension: Secondary | ICD-10-CM

## 2022-12-11 DIAGNOSIS — I255 Ischemic cardiomyopathy: Secondary | ICD-10-CM | POA: Diagnosis not present

## 2022-12-11 MED ORDER — ENTRESTO 24-26 MG PO TABS: 0.5000 | ORAL_TABLET | Freq: Two times a day (BID) | ORAL | 3 refills | Status: DC

## 2022-12-11 MED ORDER — AMLODIPINE BESYLATE 2.5 MG PO TABS: 2.5000 mg | ORAL_TABLET | Freq: Every day | ORAL | 3 refills | Status: DC

## 2022-12-11 MED ORDER — NITROGLYCERIN 0.4 MG SL SUBL: 0.4000 mg | SUBLINGUAL_TABLET | SUBLINGUAL | 11 refills | Status: AC | PRN

## 2022-12-11 MED ORDER — ENTRESTO 24-26 MG PO TABS: 1.0000 | ORAL_TABLET | Freq: Two times a day (BID) | ORAL | 3 refills | Status: DC

## 2022-12-11 NOTE — Progress Notes (Signed)
I am     Patient Care Team: Marga Hoots, NP as PCP - General (Adult Health Nurse Practitioner) Deboraha Sprang, MD as PCP - Cardiology (Cardiology) Deboraha Sprang, MD as PCP - Electrophysiology (Cardiology) Deboraha Sprang, MD as Consulting Physician (Cardiology) Serafina Mitchell, MD as Consulting Physician (Vascular Surgery) Kathrynn Ducking, MD (Inactive) as Consulting Physician (Neurology)   HPI  Connor Wilkerson is a 82 y.o. male Seen following ICD generator replacement initally implanted 2006 for primary prevention in setting of ischemic cardiomyopathy.   subsequently  the device pocket moved to subpectorally. Atrial fibrillation on Apixaban   no bleeding  He is now complaining of device tenderness over the last 6 months.  It is somewhat better over the last 3 months.  He is noted migration into his axilla.  Denies trauma.  No cellulitis of which he is aware.  He has hx of remote MI 1999, CABG  1999    .   The patient denies chest pain, shortness of breath, nocturnal dyspnea, orthopnea or peripheral edema.  There have been no palpitations, lightheadedness or syncope.      DATE TEST EF    11/16 Myoview  34 %    2/17 Cath   LM 30%; Native T; LIMA patent; SVG-PDA 70-80% anastomosis SVG-OM Patent  2/19  Echo  30-35%     3/19` LHC    LIMA p, SVG-dRCAp, SVG-OM2p,  Native T         Date Cr K Hgb  4/21 1.7 (GFR 40) 4.4 12.7   4/23 1.68 5.0 13.2  9/23 2.01 4.6 85.0   Thromboembolic risk factors ( age  -2, HTN-1, Vasc disease -1, CHF-1 ) for a CHADSVASc Score of >=5   Past Medical History:  Diagnosis Date   Abnormality of gait 02/07/2015   AICD (automatic cardioverter/defibrillator) present    [place]   CAD (coronary artery disease)    Hx of Inf MI treated with CABG in 1999 // Myoview 11/16: mild inf-lat ischemia, EF 34, high risk // LHC 3/19: 3/3 bypass grafts patent   Carotid artery disease ([place])    Followed by [facility] (Dr. Trula Slade) // [place] in  2/19: bilat ICA 27-74   Chronic systolic CHF (congestive heart failure) ([place]) 01/01/2010   Echo 12/30/2017: Mild LVH, EF 30-35, diffuse HK, grade 1 diastolic dysfunction, mildly reduced RVSF // Echo 04/12/2016:  EF 35-40, PASP 39, mild to moderate TR, mild LAE // Echo (12/14):  Base/mid inferior and inferolateral HK, EF 45%, mild LAE, mildly reduced RVSF   CKD (chronic kidney disease)    Depression    HLP (hyperkeratosis lenticularis perstans)    HTN (hypertension)    ICD (implantable cardiac defibrillator)  BSx    [employer]   Ischemic cardiomyopathy    Myocardial infarction ([place]) 1999   Near syncope 09/21/2014   Orthostatic hypotension    PAF (paroxysmal atrial fibrillation) (H[facility])    Presence of permanent cardiac pacemaker    Wears partial dentures    upper    Past Surgical History:  Procedure Laterality Date   CARDIAC CATHETERIZATION N/A 12/11/2015   Procedure: Left Heart Cath and Coronary Angiography;  Surgeon: Charolette Forward, MD;  Location: [place] [facility];  Service: Cardiovascular;  Laterality: N/A;   CARDIAC DEFIBRILLATOR PLACEMENT     [employer]   CARDIAC DEFIBRILLATOR PLACEMENT  04/08/2016   NOT MRI SAFE (DOCUMENT SCANNED IN SYSTEM   COLONOSCOPY  10/06/2007   Mild  sigmoid diverticulosis. Small internal hemorrhoids.    CORONARY ARTERY BYPASS GRAFT     ELBOW SURGERY Left    plastic bone replacement   HERNIA REPAIR     x2   LEFT HEART CATH AND CORS/GRAFTS ANGIOGRAPHY N/A 01/07/2018   Procedure: LEFT HEART CATH AND CORS/GRAFTS ANGIOGRAPHY;  Surgeon: Burnell Blanks, MD;  Location: [place] [facility];  Service: Cardiovascular;  Laterality: N/A;   PERMANENT PACEMAKER GENERATOR CHANGE N/A 11/25/2013   Procedure: PERMANENT PACEMAKER GENERATOR CHANGE;  Surgeon: Deboraha Sprang, MD;  Location: [facility];  Service: Cardiovascular;  Laterality: N/A;    ROTATOR CUFF REPAIR Right 2006   SINUS EXPLORATION  1987   ULNAR NERVE TRANSPOSITION Left 07/19/2019   Procedure:  Ulnar nerve release at the elbow with anterior transposition and flexor pronator lengthening, anterior interosseous nerve transfer to the deep motor branch of the ulnar nerve at the forearm, flexor digitorum profundus tendon transfer of the ring and small to middle fingers, ulnar nerve release at the wrist, Guyons canal;  Surgeon: Roseanne Kaufman, MD;  Location: [place];  Service: Orthoped   ULTRASOUND GUIDANCE FOR VASCULAR ACCESS  01/07/2018   Procedure: Ultrasound Guidance For Vascular Access;  Surgeon: Burnell Blanks, MD;  Location: [place] [facility];  Service: Cardiovascular;;    Current Outpatient Medications  Medication Sig Dispense Refill   apixaban (ELIQUIS) 2.5 MG TABS tablet TAKE 1 TABLET BY MOUTH TWICE A DAY 180 tablet 1   buPROPion (WELLBUTRIN XL) 150 MG 24 hr tablet Take 1 tablet by mouth every morning.     colchicine 0.6 MG tablet Take 0.6 mg by mouth daily.     cyclobenzaprine (FLEXERIL) 5 MG tablet Take 5 mg by mouth 3 (three) times daily as needed for muscle spasms.     escitalopram (LEXAPRO) 10 MG tablet Take 10 mg by mouth 2 (two) times daily.     famotidine (PEPCID) 20 MG tablet Take 20 mg by mouth 2 (two) times daily.     gabapentin (NEURONTIN) 300 MG capsule Take 300 mg by mouth 3 (three) times daily.     nitroGLYCERIN (NITROSTAT) 0.4 MG SL tablet Place 1 tablet (0.4 mg total) under the tongue every 5 (five) minutes as needed for chest pain. 25 tablet 11   oxyCODONE-acetaminophen (PERCOCET) 10-325 MG tablet Take 1 tablet by mouth 4 (four) times daily as needed for pain.     rosuvastatin (CRESTOR) 40 MG tablet Take 1 tablet (40 mg total) by mouth daily. 90 tablet 2   sacubitril-valsartan (ENTRESTO) 24-26 MG Take 0.5 tablets by mouth 2 (two) times daily. 90 tablet 3   spironolactone (ALDACTONE) 25 MG tablet TAKE 1 TABLET (25 MG TOTAL) BY MOUTH DAILY AT NIGHT ONLY 90 tablet 3   traZODone (DESYREL) 50 MG tablet Take 50 mg by mouth at bedtime as needed for sleep.     No  current facility-administered medications for this visit.    Allergies  Allergen Reactions   Tramadol Other (See Comments)    Unknown     Review of Systems negative except from HPI and PMH  Physical Exam BP (!) 160/78 (Patient Position: Standing)   Pulse 60   Ht '5\' 10"'$  (1.778 m)   Wt 161 lb 12.8 oz (73.4 kg)   SpO2 98%   BMI 23.22 kg/m  Well developed and well nourished in no acute distress HENT normal Neck supple with JVP-flat Clear Device pocket well healed; without hematoma or erythema.  There is no tethering  Regular rate and rhythm, no  murmur Abd-soft with active BS No Clubbing cyanosis  edema Skin-warm and dry A & Oriented  Grossly normal sensory and motor function  ECG atrial pacing  24/09/41  Device function is normal. Programming changes   See Paceart for details    Assessment and  Plan\  Ischemic cardiomyopathy  Implantable defibrillator    Hypertension-orthostatic hypotension-new  Device pocket pain-new  Renal insufficiency grade 3b  Congestive heart failure-chronic-systolic  Atrial fibrillation     No interval atrial fibrillation.  No bleeding continue the Eliquis it is 2.5 twice daily dose for renal function and age  No symptoms of ischemia.  Hypertension with less orthostasis.  Will gradually increase meds add amlodipine for BP 2.5   Followed by renal  Euvolemic.  Continue the Aldactone.  (GFR 9/23 above 30--CKD-EPI)

## 2022-12-11 NOTE — Patient Instructions (Addendum)
Medication Instructions:  Your physician has recommended you make the following change in your medication:   ** Begin Amlodipine 2.'5mg'$  - 1 tablet by mouth daily  *If you need a refill on your cardiac medications before your next appointment, please call your pharmacy*   Lab Work: None ordered.  If you have labs (blood work) drawn today and your tests are completely normal, you will receive your results only by: [place] (if you have MyChart) OR A paper copy in the mail If you have any lab test that is abnormal or we need to change your treatment, we will call you to review the results.   Testing/Procedures: None ordered.    Follow-Up: At [facility], you and your health needs are our priority.  As part of our continuing mission to provide you with exceptional heart care, we have created designated Provider Care Teams.  These Care Teams include your primary Cardiologist (physician) and Advanced Practice Providers (APPs -  Physician Assistants and Nurse Practitioners) who all work together to provide you with the care you need, when you need it.  We recommend signing up for the patient portal called "MyChart".  Sign up information is provided on this After Visit Summary.  MyChart is used to connect with patients for Virtual Visits (Telemedicine).  Patients are able to view lab/test results, encounter notes, upcoming appointments, etc.  Non-urgent messages can be sent to your provider as well.   To learn more about what you can do with MyChart, go to [email or web address].    Your next appointment:   12 months with Dr Caryl Comes

## 2022-12-15 ENCOUNTER — Telehealth: Payer: Self-pay | Admitting: Internal Medicine

## 2022-12-15 NOTE — Telephone Encounter (Signed)
Pt c/o medication issue:  1. Name of Medication: amLODipine (NORVASC) 2.5 MG tablet   2. How are you currently taking this medication (dosage and times per day)?   Take 1 tablet (2.5 mg total) by mouth daily.    3. Are you having a reaction (difficulty breathing--STAT)?   4. What is your medication issue? Patient wife wants to know if he needs to take this medication in the morning.  She read it could make him dizzy and she said he is the only driver in the house and there was a warning about operating heavy machinery.

## 2022-12-17 ENCOUNTER — Telehealth: Payer: Self-pay | Admitting: Internal Medicine

## 2022-12-17 NOTE — Telephone Encounter (Signed)
Patients wife called stating they have received message on the patients MyChart. They are having difficulty accessing the account. Verified in the chart there not any active results. Patient voiced understanding.

## 2022-12-17 NOTE — Telephone Encounter (Signed)
New Message:      Wife called and  said  she received a notification that she had a message about test patient had on 12-11-22. She says she can not get into My Chart. She needs someone to tell her what the message is please.

## 2022-12-18 NOTE — Telephone Encounter (Signed)
Spoke with pt's wife, DPR and advised pt is on small dose of Amlodipine and medication is taken in the morning most of the time.  Pt could experience some dizziness starting medication and should change positions slowly.  Continue monitoring BP and call for any further questions or concerns.  Pt's wife verbalizes understanding and agrees with current plan.

## 2022-12-22 NOTE — Addendum Note (Signed)
Addended by: Michelle Nasuti on: 12/22/2022 04:36 PM   Modules accepted: Orders

## 2022-12-23 DIAGNOSIS — L219 Seborrheic dermatitis, unspecified: Secondary | ICD-10-CM | POA: Diagnosis not present

## 2023-01-07 ENCOUNTER — Telehealth: Payer: Self-pay | Admitting: Internal Medicine

## 2023-01-07 NOTE — Telephone Encounter (Signed)
Pt c/o BP issue: STAT if pt c/o blurred vision, one-sided weakness or slurred speech  1. What are your last 5 BP readings?   178/63 Wed, 3/6 180/60 Wed, 3/6 173/72 Tue, 3/5 139/79 Mon, 3/4  2. Are you having any other symptoms (ex. Dizziness, headache, blurred vision, passed out)?   Wife states he has been very tired  3. What is your BP issue?     Wife is concerned regarding patient's high BP readings.  Wife states patient has been having to do everything as she is recovering.

## 2023-01-07 NOTE — Telephone Encounter (Signed)
Pt's wife called reporting fluctuating blood pressure readings and pt did not sleep well last night. However, when I spoke to the patient he mentioned the reason he could not sleep was due to his wife. He did take a sleeping pill and slept great. Denies any symptoms. Will forward to MD and nurse.  178/63 Wed, 3/6 180/60 Wed, 3/6 173/72 Tue, 3/5 139/79 Mon, 3/4

## 2023-01-09 NOTE — Telephone Encounter (Signed)
Per Dr. Caryl Comes- Lets have him increase amlodipine from 2.5 to 5 daily plz  Thanks SK   Spoke with patient's wife and she will increase dose from 2.'5mg'$  to '5mg'$  daily (2tabs). She verbalized understanding.

## 2023-01-14 MED ORDER — AMLODIPINE BESYLATE 2.5 MG PO TABS: 5.0000 mg | ORAL_TABLET | Freq: Every day | ORAL | 3 refills | Status: DC

## 2023-01-14 NOTE — Addendum Note (Signed)
Addended by: Thora Lance on: 01/14/2023 12:57 PM   Modules accepted: Orders

## 2023-01-21 DIAGNOSIS — M25511 Pain in right shoulder: Secondary | ICD-10-CM | POA: Diagnosis not present

## 2023-02-09 ENCOUNTER — Telehealth: Payer: Self-pay | Admitting: Internal Medicine

## 2023-02-09 MED ORDER — AMLODIPINE BESYLATE 2.5 MG PO TABS: 5.0000 mg | ORAL_TABLET | Freq: Every day | ORAL | 3 refills | Status: DC

## 2023-02-09 NOTE — Telephone Encounter (Signed)
Refill for Amlodipine 2.5 taking 2 tablets daily has been sent to [facility].

## 2023-02-09 NOTE — Telephone Encounter (Signed)
Pt c/o medication issue:  1. Name of Medication: amLODipine (NORVASC) 2.5 MG tablet   2. How are you currently taking this medication (dosage and times per day)? Take 2 tablets (5 mg total) by mouth daily.   3. Are you having a reaction (difficulty breathing--STAT)? no  4. What is your medication issue? Wife states it was recently increased to take two tablets. She needs a script sent to   [id number] - [place], [place] - [address]

## 2023-02-11 DIAGNOSIS — Z6823 Body mass index (BMI) 23.0-23.9, adult: Secondary | ICD-10-CM | POA: Diagnosis not present

## 2023-02-11 DIAGNOSIS — R5383 Other fatigue: Secondary | ICD-10-CM | POA: Diagnosis not present

## 2023-02-11 DIAGNOSIS — N2889 Other specified disorders of kidney and ureter: Secondary | ICD-10-CM | POA: Diagnosis not present

## 2023-02-11 DIAGNOSIS — I255 Ischemic cardiomyopathy: Secondary | ICD-10-CM | POA: Diagnosis not present

## 2023-02-11 DIAGNOSIS — N189 Chronic kidney disease, unspecified: Secondary | ICD-10-CM | POA: Diagnosis not present

## 2023-02-11 DIAGNOSIS — E7800 Pure hypercholesterolemia, unspecified: Secondary | ICD-10-CM | POA: Diagnosis not present

## 2023-02-16 DIAGNOSIS — N2889 Other specified disorders of kidney and ureter: Secondary | ICD-10-CM | POA: Diagnosis not present

## 2023-02-16 DIAGNOSIS — D649 Anemia, unspecified: Secondary | ICD-10-CM | POA: Diagnosis not present

## 2023-02-20 ENCOUNTER — Other Ambulatory Visit: Payer: Self-pay | Admitting: Internal Medicine

## 2023-03-13 ENCOUNTER — Telehealth: Payer: Self-pay | Admitting: Internal Medicine

## 2023-03-13 NOTE — Telephone Encounter (Signed)
Pt c/o BP issue: STAT if pt c/o blurred vision, one-sided weakness or slurred speech  1. What are your last 5 BP readings? 166/51: 104/83  2. Are you having any other symptoms (ex. Dizziness, headache, blurred vision, passed out)? Dizziness, shakiness   3. What is your BP issue?

## 2023-03-13 NOTE — Telephone Encounter (Signed)
Spoke with pt's wife, DPR who states pt's BP has been inconsistent yesterday and today.  See readings below.  Pt's wife states they use a wrist cuff.  Pt is taking medications but Spironolactone has not yet been increased as previously ordered due to pt's wife being unaware of change.  Readings were taken at random times during the day.  Pt has known orthostatic hypotension.  He is not dizzy or shaky now.  This happens only when pt changes positions too quickly. Pt's wife advised today's reading of 104/83 is WNL.  Recommended pt check BP 2 hours after taking morning medications and record x 5 days and contact RN with readings.  If pt hypotensive below 100/60 she may give pt a salty snack and hydrate with water. Encouraged pt to change positions slowly.  Pt's wife verbalizes understanding and agrees with current plan.

## 2023-03-16 DIAGNOSIS — H1032 Unspecified acute conjunctivitis, left eye: Secondary | ICD-10-CM | POA: Diagnosis not present

## 2023-03-20 DIAGNOSIS — G2581 Restless legs syndrome: Secondary | ICD-10-CM | POA: Diagnosis not present

## 2023-03-20 DIAGNOSIS — E7800 Pure hypercholesterolemia, unspecified: Secondary | ICD-10-CM | POA: Diagnosis not present

## 2023-03-20 DIAGNOSIS — R5383 Other fatigue: Secondary | ICD-10-CM | POA: Diagnosis not present

## 2023-03-20 DIAGNOSIS — N2889 Other specified disorders of kidney and ureter: Secondary | ICD-10-CM | POA: Diagnosis not present

## 2023-03-20 DIAGNOSIS — Z6823 Body mass index (BMI) 23.0-23.9, adult: Secondary | ICD-10-CM | POA: Diagnosis not present

## 2023-03-20 DIAGNOSIS — N189 Chronic kidney disease, unspecified: Secondary | ICD-10-CM | POA: Diagnosis not present

## 2023-03-20 DIAGNOSIS — I255 Ischemic cardiomyopathy: Secondary | ICD-10-CM | POA: Diagnosis not present

## 2023-03-23 DIAGNOSIS — N2889 Other specified disorders of kidney and ureter: Secondary | ICD-10-CM | POA: Diagnosis not present

## 2023-03-23 DIAGNOSIS — K5730 Diverticulosis of large intestine without perforation or abscess without bleeding: Secondary | ICD-10-CM | POA: Diagnosis not present

## 2023-03-23 DIAGNOSIS — N189 Chronic kidney disease, unspecified: Secondary | ICD-10-CM | POA: Diagnosis not present

## 2023-03-23 DIAGNOSIS — I700 Atherosclerosis of aorta: Secondary | ICD-10-CM | POA: Diagnosis not present

## 2023-03-27 ENCOUNTER — Encounter: Payer: Self-pay | Admitting: Nurse Practitioner

## 2023-03-27 ENCOUNTER — Other Ambulatory Visit: Payer: Self-pay

## 2023-03-27 ENCOUNTER — Ambulatory Visit: Payer: Medicare PPO | Attending: Nurse Practitioner | Admitting: Nurse Practitioner

## 2023-03-27 VITALS — BP 110/52 | HR 61 | Ht 70.0 in | Wt 156.0 lb

## 2023-03-27 DIAGNOSIS — E785 Hyperlipidemia, unspecified: Secondary | ICD-10-CM

## 2023-03-27 DIAGNOSIS — N1832 Chronic kidney disease, stage 3b: Secondary | ICD-10-CM

## 2023-03-27 DIAGNOSIS — I255 Ischemic cardiomyopathy: Secondary | ICD-10-CM | POA: Diagnosis not present

## 2023-03-27 DIAGNOSIS — I10 Essential (primary) hypertension: Secondary | ICD-10-CM

## 2023-03-27 DIAGNOSIS — I2510 Atherosclerotic heart disease of native coronary artery without angina pectoris: Secondary | ICD-10-CM

## 2023-03-27 DIAGNOSIS — I6523 Occlusion and stenosis of bilateral carotid arteries: Secondary | ICD-10-CM

## 2023-03-27 DIAGNOSIS — Z95810 Presence of automatic (implantable) cardiac defibrillator: Secondary | ICD-10-CM | POA: Diagnosis not present

## 2023-03-27 DIAGNOSIS — M25571 Pain in right ankle and joints of right foot: Secondary | ICD-10-CM | POA: Diagnosis not present

## 2023-03-27 DIAGNOSIS — I480 Paroxysmal atrial fibrillation: Secondary | ICD-10-CM

## 2023-03-27 DIAGNOSIS — I951 Orthostatic hypotension: Secondary | ICD-10-CM

## 2023-03-27 NOTE — Patient Instructions (Signed)
Medication Instructions:  Decrease Amlodipine 2.5 mg daily Decrease Spironolactone 12.5 mg daily  *If you need a refill on your cardiac medications before your next appointment, please call your pharmacy*   Lab Work: BMET in 1 week.  Testing/Procedures: NONE ordered at this time of appointment     Follow-Up: At [facility], you and your health needs are our priority.  As part of our continuing mission to provide you with exceptional heart care, we have created designated Provider Care Teams.  These Care Teams include your primary Cardiologist (physician) and Advanced Practice Providers (APPs -  Physician Assistants and Nurse Practitioners) who all work together to provide you with the care you need, when you need it.  We recommend signing up for the patient portal called "MyChart".  Sign up information is provided on this After Visit Summary.  MyChart is used to connect with patients for Virtual Visits (Telemedicine).  Patients are able to view lab/test results, encounter notes, upcoming appointments, etc.  Non-urgent messages can be sent to your provider as well.   To learn more about what you can do with MyChart, go to [email or web address].    Your next appointment:   6-8 week(s)  Provider:   Bernadene Person, NP        Other Instructions

## 2023-03-27 NOTE — Progress Notes (Signed)
Office Visit    Patient Name: Connor Wilkerson Date of Encounter: 03/27/2023  Primary Care Provider:  Doran Stabler, NP Primary Cardiologist:  Sherryl Manges, MD  Chief Complaint    82 year old male with a history of CAD s/p CABG in 1999, ICM s/p ICD, paroxysmal atrial fibrillation, orthostatic hypotension, hypertension, hyperlipidemia, carotid artery stenosis, and CKD stage III who presents for follow-up related to CAD and hypertension.  Past Medical History    Past Medical History:  Diagnosis Date   Abnormality of gait 02/07/2015   AICD (automatic cardioverter/defibrillator) present    [employer] - Graciela Husbands   CAD (coronary artery disease)    Hx of Inf MI treated with CABG in 1999 // Myoview 11/16: mild inf-lat ischemia, EF 34, high risk // LHC 3/19: 3/3 bypass grafts patent   Carotid artery disease (HCC)    Followed by [facility] (Dr. Myra Gianotti) // [place] in 2/19: bilat ICA 40-59   Chronic systolic CHF (congestive heart failure) (HCC) 01/01/2010   Echo 12/30/2017: Mild LVH, EF 30-35, diffuse HK, grade 1 diastolic dysfunction, mildly reduced RVSF // Echo 04/12/2016:  EF 35-40, PASP 39, mild to moderate TR, mild LAE // Echo (12/14):  Base/mid inferior and inferolateral HK, EF 45%, mild LAE, mildly reduced RVSF   CKD (chronic kidney disease)    Depression    HLP (hyperkeratosis lenticularis perstans)    HTN (hypertension)    ICD (implantable cardiac defibrillator)  BSx    [employer]   Ischemic cardiomyopathy    Myocardial infarction (HCC) 1999   Near syncope 09/21/2014   Orthostatic hypotension    PAF (paroxysmal atrial fibrillation) (HCC)    Presence of permanent cardiac pacemaker    Wears partial dentures    upper   Past Surgical History:  Procedure Laterality Date   CARDIAC CATHETERIZATION N/A 12/11/2015   Procedure: Left Heart Cath and Coronary Angiography;  Surgeon: Rinaldo Cloud, MD;  Location: [facility] INVASIVE CV [facility];  Service: Cardiovascular;  Laterality: N/A;   CARDIAC  DEFIBRILLATOR PLACEMENT     [employer]   CARDIAC DEFIBRILLATOR PLACEMENT  04/08/2016   NOT MRI SAFE (DOCUMENT SCANNED IN SYSTEM   COLONOSCOPY  10/06/2007   Mild sigmoid diverticulosis. Small internal hemorrhoids.    CORONARY ARTERY BYPASS GRAFT     ELBOW SURGERY Left    plastic bone replacement   HERNIA REPAIR     x2   LEFT HEART CATH AND CORS/GRAFTS ANGIOGRAPHY N/A 01/07/2018   Procedure: LEFT HEART CATH AND CORS/GRAFTS ANGIOGRAPHY;  Surgeon: Kathleene Hazel, MD;  Location: [facility];  Service: Cardiovascular;  Laterality: N/A;   PERMANENT PACEMAKER GENERATOR CHANGE N/A 11/25/2013   Procedure: PERMANENT PACEMAKER GENERATOR CHANGE;  Surgeon: Duke Salvia, MD;  Location: [facility] CATH LAB;  Service: Cardiovascular;  Laterality: N/A;    ROTATOR CUFF REPAIR Right 2006   SINUS EXPLORATION  1987   ULNAR NERVE TRANSPOSITION Left 07/19/2019   Procedure: Ulnar nerve release at the elbow with anterior transposition and flexor pronator lengthening, anterior interosseous nerve transfer to the deep motor branch of the ulnar nerve at the forearm, flexor digitorum profundus tendon transfer of the ring and small to middle fingers, ulnar nerve release at the wrist, Guyons canal;  Surgeon: Dominica Severin, MD;  Location: [facility] OR;  Service: Orthoped   ULTRASOUND GUIDANCE FOR VASCULAR ACCESS  01/07/2018   Procedure: Ultrasound Guidance For Vascular Access;  Surgeon: Kathleene Hazel, MD;  Location: [facility] INVASIVE CV LAB;  Service: Cardiovascular;;  Allergies  Allergies  Allergen Reactions   Tramadol Other (See Comments)    Unknown      Labs/Other Studies Reviewed    The following studies were reviewed today:  Cardiac Studies & Procedures   CARDIAC CATHETERIZATION  CARDIAC CATHETERIZATION 01/07/2018  Narrative  Prox RCA lesion is 100% stenosed.  SVG graft was visualized by angiography and is normal in caliber.  Post Atrio lesion is 60% stenosed.  Prox Cx lesion is 100%  stenosed.  SVG graft was visualized by angiography and is normal in caliber.  LIMA graft was visualized by angiography and is normal in caliber.  Ost LAD lesion is 40% stenosed.  Prox LAD lesion is 40% stenosed.  Prox LAD to Mid LAD lesion is 90% stenosed.  1. Severe triple vessel CAD s/p 3V CABG with 3/3 patent bypass grafts 2. Severe stenosis mid LAD. The mid and distal LAD fills from the LIMA graft 3. The mid Circumflex is chronically occluded. The OM branch fills from the patent vein graft 4. The RCA is chronically occluded just beyond the ostium. The distal RCA and PDA fills from the patent vein graft. The native PDA has moderate stenosis beyond the graft insertion which is unchanged from cardiac cath in 2017.  Recommendations: Continue medical management of CAD.  Findings Coronary Findings Diagnostic  Dominance: Right  Left Anterior Descending Vessel is large. Ost LAD lesion is 40% stenosed. Prox LAD lesion is 40% stenosed. Prox LAD to Mid LAD lesion is 90% stenosed.  Left Circumflex Vessel is moderate in size. Prox Cx lesion is 100% stenosed. The lesion is chronically occluded.  Second Obtuse Marginal Branch Vessel is moderate in size.  Right Coronary Artery Vessel is moderate in size. Prox RCA lesion is 100% stenosed. The lesion is chronically occluded.  Right Posterior Atrioventricular Artery Post Atrio lesion is 60% stenosed.  Saphenous Graft To Dist RCA SVG graft was visualized by angiography and is normal in caliber.  Saphenous Graft To 2nd Mrg SVG graft was visualized by angiography and is normal in caliber.  LIMA LIMA Graft To Mid LAD LIMA graft was visualized by angiography and is normal in caliber.  Intervention  No interventions have been documented.   CARDIAC CATHETERIZATION  CARDIAC CATHETERIZATION 12/11/2015  Narrative  Ost RCA to Mid RCA lesion, 100% stenosed.  Ost RPDA lesion, 70% stenosed.  Ost Cx lesion, 60% stenosed.  Prox Cx  lesion, 80% stenosed.  Prox Cx to Mid Cx lesion, 100% stenosed.  Ost LM lesion, 40% stenosed.  Prox LAD lesion, 40% stenosed.  Mid LAD-1 lesion, 70% stenosed.  Mid LAD-2 lesion, 100% stenosed.  Findings Coronary Findings Diagnostic  Dominance: Right  Left Main  Left Anterior Descending  Left Circumflex  Right Coronary Artery  Right Posterior Descending Artery  Graft To Ost RPDA  Sequential Graft To 1st Mrg, 3rd Mrg  LIMA Graft To Dist LAD  Intervention  No interventions have been documented.   STRESS TESTS  NM MYOCAR MULTI W/SPECT W 09/12/2015  Narrative  Defect 1: There is a medium defect of mild severity present in the basal inferolateral and mid inferolateral location.  Findings consistent with ischemia.  The left ventricular ejection fraction is moderately decreased (30-44%).  Nuclear stress EF: 34%.  This is a high risk study.   ECHOCARDIOGRAM  ECHOCARDIOGRAM COMPLETE 12/30/2017  Narrative *[facility]* [address] [place], [place] [address] [phone]  ------------------------------------------------------------------- Transthoracic Echocardiography  Patient:    Jerit, Tacker MR #:  [id number] Study Date: 12/30/2017 Gender:     M Age:        77 Height:     177.8 cm Weight:     78.4 kg BSA:        1.98 m^2 Pt. Status: Room:  ATTENDING    Kristeen Miss, M.D. ORDERING     Sherryl Manges REFERRING    Sherryl Manges SONOGRAPHER  Clearence Ped, RCS PERFORMING   [facility], Outpatient  cc:  ------------------------------------------------------------------- LV EF: 30% -   35%  ------------------------------------------------------------------- Indications:      Ischemic Cardiomyopathy (I25.5).  ------------------------------------------------------------------- History:   PMH:  ICD.  Atrial fibrillation.  PMH:   Myocardial infarction.  ------------------------------------------------------------------- Study  Conclusions  - Left ventricle: The cavity size was normal. Wall thickness was increased in a pattern of mild LVH. Systolic function was moderately to severely reduced. The estimated ejection fraction was in the range of 30% to 35%. Diffuse hypokinesis. Doppler parameters are consistent with abnormal left ventricular relaxation (grade 1 diastolic dysfunction). - Right ventricle: Systolic function was mildly reduced.  ------------------------------------------------------------------- Study data:  Comparison was made to the study of 04/11/2016.  Study status:  Routine.  Procedure:  Transthoracic echocardiography. Image quality was adequate.          Transthoracic echocardiography. M-mode, complete 2D,3D, spectral Doppler, and color Doppler.  Birthdate:  Patient birthdate: 19-Sep-1941.  Age: Patient is 82 yr old.  Sex:  Gender: male.    BMI: 24.8 kg/m^2. Blood pressure:     144/90  Patient status:  Outpatient.  Study date:  Study date: 12/30/2017. Study time: 11:49 AM.  Location: [facility]  -------------------------------------------------------------------  ------------------------------------------------------------------- Left ventricle:  The cavity size was normal. Wall thickness was increased in a pattern of mild LVH. Systolic function was moderately to severely reduced. The estimated ejection fraction was in the range of 30% to 35%. Diffuse hypokinesis. Doppler parameters are consistent with abnormal left ventricular relaxation (grade 1 diastolic dysfunction).  ------------------------------------------------------------------- Aortic valve:   Structurally normal valve.   Cusp separation was normal.  Doppler:  Transvalvular velocity was within the normal range. There was no stenosis. There was no regurgitation.  ------------------------------------------------------------------- Aorta:  Aortic root: The aortic root was normal in size. Ascending aorta: The ascending  aorta was normal in size.  ------------------------------------------------------------------- Mitral valve:   Structurally normal valve.   Leaflet separation was normal.  Doppler:  Transvalvular velocity was within the normal range. There was no evidence for stenosis. There was no regurgitation.  ------------------------------------------------------------------- Left atrium:  The atrium was normal in size.  ------------------------------------------------------------------- Right ventricle:  Pacer wire or catheter noted in right ventricle. Systolic function was mildly reduced.  ------------------------------------------------------------------- Pulmonic valve:    The valve appears to be grossly normal. Doppler:  There was no significant regurgitation.  ------------------------------------------------------------------- Tricuspid valve:   The valve appears to be grossly normal. Doppler:  There was mild regurgitation.  ------------------------------------------------------------------- Pulmonary artery:   Systolic pressure was within the normal range.  ------------------------------------------------------------------- Right atrium:  The atrium was normal in size.  ------------------------------------------------------------------- Pericardium:  There was no pericardial effusion.  ------------------------------------------------------------------- Systemic veins: Inferior vena cava: The vessel was normal in size. The respirophasic diameter changes were in the normal range (= 50%), consistent with normal central venous pressure. Diameter: 17 mm.  ------------------------------------------------------------------- Measurements  IVC  Value        Reference ID                                         17    mm     ----------  Left ventricle                             Value        Reference LV ID, ED, PLAX chordal                    45.5   mm     43 - 52 LV ID, ES, PLAX chordal            (H)     40.8  mm     23 - 38 LV fx shortening, PLAX chordal     (L)     10    %      >=29 LV PW thickness, ED                        13.3  mm     ---------- IVS/LV PW ratio, ED                        0.97         <=1.3 Stroke volume, 2D                          40    ml     ---------- Stroke volume/bsa, 2D                      20    ml/m^2 ---------- LV e&', lateral                             8.81  cm/s   ---------- LV E/e&', lateral                           4.79         ---------- LV e&', medial                              4.24  cm/s   ---------- LV E/e&', medial                            9.95         ---------- LV e&', average                             6.53  cm/s   ---------- LV E/e&', average                           6.47         ----------  Ventricular septum                         Value        Reference IVS thickness, ED  12.9  mm     ----------  LVOT                                       Value        Reference LVOT ID, S                                 20    mm     ---------- LVOT area                                  3.14  cm^2   ---------- LVOT peak velocity, S                      65.8  cm/s   ---------- LVOT mean velocity, S                      41.3  cm/s   ---------- LVOT VTI, S                                12.7  cm     ----------  Aorta                                      Value        Reference Aortic root ID, ED                         31    mm     ----------  Left atrium                                Value        Reference LA ID, A-P, ES                             48    mm     ---------- LA ID/bsa, A-P                     (H)     2.43  cm/m^2 <=2.2 LA volume, S                               53.6  ml     ---------- LA volume/bsa, S                           27.1  ml/m^2 ---------- LA volume, ES, 1-p A4C                     48.5  ml     ---------- LA volume/bsa, ES, 1-p A4C                  24.5  ml/m^2 ---------- LA volume, ES, 1-p A2C  59.7  ml     ---------- LA volume/bsa, ES, 1-p A2C                 30.2  ml/m^2 ----------  Mitral valve                               Value        Reference Mitral E-wave peak velocity                42.2  cm/s   ---------- Mitral A-wave peak velocity                63.1  cm/s   ---------- Mitral deceleration time           (H)     317   ms     150 - 230 Mitral E/A ratio, peak                     0.7          ----------  Pulmonary arteries                         Value        Reference PA pressure, S, DP                         23    mm Hg  <=30  Tricuspid valve                            Value        Reference Tricuspid regurg peak velocity             224   cm/s   ---------- Tricuspid peak RV-RA gradient              20    mm Hg  ---------- Tricuspid maximal regurg velocity,         224   cm/s   ---------- PISA  Right atrium                               Value        Reference RA ID, S-I, ES, A4C                (H)     51.4  mm     34 - 49 RA area, ES, A4C                           14.4  cm^2   8.3 - 19.5 RA volume, ES, A/L                         33.4  ml     ---------- RA volume/bsa, ES, A/L                     16.9  ml/m^2 ----------  Systemic veins                             Value        Reference Estimated CVP  3     mm Hg  ----------  Right ventricle                            Value        Reference TAPSE                                      16    mm     ---------- RV pressure, S, DP                         23    mm Hg  <=30 RV s&', lateral, S                          6.96  cm/s   ----------  Legend: (L)  and  (H)  mark values outside specified reference range.  ------------------------------------------------------------------- Prepared and Electronically Authenticated by  Kristeen Miss, M.D. 2019-02-27T15:06:51            Recent Labs: 07/23/2022: BUN 24;  Creatinine, Ser 2.01; Hemoglobin 12.5; Platelets 177; Potassium 4.6; Sodium 142  Recent Lipid Panel    Component Value Date/Time   CHOL 156 12/11/2015 0529   TRIG 112 12/11/2015 0529   HDL 47 12/11/2015 0529   CHOLHDL 3.3 12/11/2015 0529   VLDL 22 12/11/2015 0529   LDLCALC 87 12/11/2015 0529    History of Present Illness    82 year old male with the above past medical history including CAD s/p CABG in 1999, ICM s/p ICD, paroxysmal atrial fibrillation, orthostatic hypotension, hypertension, hyperlipidemia, carotid artery stenosis, and CKD stage III.  He has followed primarily with Dr. Graciela Husbands for management of his ICD (implanted in 2006 for primary for management in setting of ICM).  He has a history of remote CABG in 1999.  Cardiac catheterization in 2017 revealed patent grafts.  Most recent cardiac catheterization in 2019 again revealed patent grafts.  Echocardiogram in 04/2022 revealed normal LV systolic function.  Additionally, he has a history of paroxysmal atrial fibrillation on Eliquis.  He has a history of moderate bilateral carotid artery stenosis, follows with vascular surgery.  He also follows with nephrology for history of CKD.  He was last seen in the office on 12/11/2022 and was stable from a cardiac standpoint.  He contacted our office on 03/13/2023 with concerns for hypotension.  He presents today for follow-up accompanied by his wife.  Since his last visit he has been stable overall from a cardiac standpoint.  He did have an episode that occurred the other week during which he was outside throwing a tennis ball with his Advertising account planner.  He felt lightheaded and lost his balance and thinks he sprained his right ankle.  He is going to see an orthopedic doctor today.  He recently increased his dose of amlodipine and spironolactone.  Since this time he has noted increased symptoms of orthostatic dizzines, presyncope.  He denies any palpitations, dyspnea, edema, PND, orthopnea, weight gain,  denies symptoms concerning for angina.  His PCP advised him to follow-up with nephrology in the setting of worsening renal function.    Home Medications    Current Outpatient Medications  Medication Sig Dispense Refill   amLODipine (NORVASC) 2.5 MG tablet Take 2 tablets (5 mg total) by mouth daily. 180 tablet 3  apixaban (ELIQUIS) 2.5 MG TABS tablet TAKE 1 TABLET BY MOUTH TWICE A DAY 180 tablet 1   buPROPion (WELLBUTRIN XL) 150 MG 24 hr tablet Take 1 tablet by mouth every morning.     cyclobenzaprine (FLEXERIL) 5 MG tablet Take 5 mg by mouth 3 (three) times daily as needed for muscle spasms.     escitalopram (LEXAPRO) 10 MG tablet Take 10 mg by mouth 2 (two) times daily.     famotidine (PEPCID) 20 MG tablet Take 20 mg by mouth 2 (two) times daily.     gabapentin (NEURONTIN) 300 MG capsule Take 300 mg by mouth 3 (three) times daily.     nitroGLYCERIN (NITROSTAT) 0.4 MG SL tablet Place 1 tablet (0.4 mg total) under the tongue every 5 (five) minutes as needed for chest pain. 25 tablet 11   oxyCODONE-acetaminophen (PERCOCET) 10-325 MG tablet Take 1 tablet by mouth 4 (four) times daily as needed for pain.     rosuvastatin (CRESTOR) 40 MG tablet TAKE 1 TABLET BY MOUTH EVERY DAY 90 tablet 2   sacubitril-valsartan (ENTRESTO) 24-26 MG Take 0.5 tablets by mouth 2 (two) times daily. 90 tablet 3   spironolactone (ALDACTONE) 25 MG tablet TAKE 1 TABLET (25 MG TOTAL) BY MOUTH DAILY AT NIGHT ONLY (Patient taking differently: Take 12.5 mg by mouth daily.) 90 tablet 3   traZODone (DESYREL) 50 MG tablet Take 50 mg by mouth at bedtime as needed for sleep.     No current facility-administered medications for this visit.     Review of Systems    He denies chest pain, palpitations, dyspnea, pnd, orthopnea, n, v,, syncope, edema, weight gain, or early satiety. All other systems reviewed and are otherwise negative except as noted above.   Physical Exam    VS:  BP (!) 110/52 (BP Location: Right Arm, Patient  Position: Sitting, Cuff Size: Normal)   Pulse 61   Ht 5\' 10"  (1.778 m)   Wt 156 lb (70.8 kg)   BMI 22.38 kg/m   GEN: Well nourished, well developed, in no acute distress. HEENT: normal. Neck: Supple, no JVD, carotid bruits, or masses. Cardiac: RRR, no murmurs, rubs, or gallops. No clubbing, cyanosis, edema.  Radials/DP/PT 2+ and equal bilaterally.  Respiratory:  Respirations regular and unlabored, clear to auscultation bilaterally. GI: Soft, nontender, nondistended, BS + x 4. MS: no deformity or atrophy. Skin: warm and dry, no rash. Neuro:  Strength and sensation are intact. Psych: Normal affect.  Accessory Clinical Findings    ECG personally reviewed by me today -atrial paced, 61 bpm- no acute changes.   Lab Results  Component Value Date   WBC 6.7 07/23/2022   HGB 12.5 (L) 07/23/2022   HCT 36.6 (L) 07/23/2022   MCV 99 (H) 07/23/2022   PLT 177 07/23/2022   Lab Results  Component Value Date   CREATININE 2.01 (H) 07/23/2022   BUN 24 07/23/2022   NA 142 07/23/2022   K 4.6 07/23/2022   CL 106 07/23/2022   CO2 21 07/23/2022   Lab Results  Component Value Date   ALT 19 12/22/2020   AST 27 12/22/2020   ALKPHOS 89 12/22/2020   BILITOT 0.3 12/22/2020   Lab Results  Component Value Date   CHOL 156 12/11/2015   HDL 47 12/11/2015   LDLCALC 87 12/11/2015   TRIG 112 12/11/2015   CHOLHDL 3.3 12/11/2015    No results found for: "HGBA1C"  Assessment & Plan    1. Hypertension/orthostatic hypotension: He has had labile  BP with increased orthostatic symptoms since having increased his dose of amlodipine and spironolactone.  He had a near syncopal episode that resulted in an injury to his right ankle.  He denies syncope.  Will decrease amlodipine to 2.5 mg daily. Will also decrease spironolactone to 12.5 mg daily.  Continue to monitor symptoms, monitor BP.  Discussed adequate hydration, gradual position changes, abdominal binder, thigh sleeves.  2. CAD: S/p remote CABG. Stable  with no anginal symptoms. No indication for ischemic evaluation.  No ASA in the setting of chronic DOAC therapy. Continue amlodipine as above, Entresto, spironolactone as above, and Crestor.    3. ICM: S/p ICD. Most recent echo in 04/2022 revealed normal LV systolic function. Euvolemic and well compensated on exam.  Continue current medications as above. Follows with EP.    4. Paroxysmal atrial fibrillation: Denies any recent palpitations, atrial paced.  Continue Eliquis.  5. Hyperlipidemia: LDL was 40 in 02/2023.  Continue Crestor.  6. Carotid artery stenosis/PAD: Most recent carotid ultrasound in 06/2022 revealed 40 to 59% BICA stenosis.  He is due for repeat carotid ultrasound/ABIs in 06/2023.  Follows with vascular surgery.  7. CKD stage IIIb: Recent labs showed a creatinine of 2.49, potassium of 5.8.  This was shortly after he increased his spironolactone.  Will decrease spironolactone to 12.5 mg daily.  Will repeat BMET in 1 week.  Recommend follow-up with nephrology.  Follows with nephrology.    8. Disposition: Follow-up in 6-8 weeks.       Joylene Grapes, NP 03/27/2023, 2:14 PM

## 2023-04-03 NOTE — Telephone Encounter (Signed)
Spoke with pts spouse. She is aware of Bernadene Person NP recommendations. She also asked me to send it in [place]. Repose sent in mychart per pts request.

## 2023-04-09 DIAGNOSIS — M79672 Pain in left foot: Secondary | ICD-10-CM | POA: Diagnosis not present

## 2023-04-09 DIAGNOSIS — S92515A Nondisplaced fracture of proximal phalanx of left lesser toe(s), initial encounter for closed fracture: Secondary | ICD-10-CM | POA: Diagnosis not present

## 2023-04-09 DIAGNOSIS — S93491A Sprain of other ligament of right ankle, initial encounter: Secondary | ICD-10-CM | POA: Diagnosis not present

## 2023-04-10 ENCOUNTER — Other Ambulatory Visit: Payer: Self-pay | Admitting: Orthopaedic Surgery

## 2023-04-10 DIAGNOSIS — S93491A Sprain of other ligament of right ankle, initial encounter: Secondary | ICD-10-CM

## 2023-04-13 ENCOUNTER — Ambulatory Visit: Payer: Medicare PPO | Admitting: Podiatry

## 2023-04-13 ENCOUNTER — Encounter: Payer: Self-pay | Admitting: Orthopaedic Surgery

## 2023-04-14 DIAGNOSIS — I509 Heart failure, unspecified: Secondary | ICD-10-CM

## 2023-04-14 HISTORY — DX: Heart failure, unspecified: I50.9

## 2023-04-15 ENCOUNTER — Ambulatory Visit
Admission: RE | Admit: 2023-04-15 | Discharge: 2023-04-15 | Disposition: A | Discharge status: Home / Self Care | Payer: Medicare PPO | Source: Ambulatory Visit | Attending: Orthopaedic Surgery | Admitting: Orthopaedic Surgery

## 2023-04-15 DIAGNOSIS — S93491A Sprain of other ligament of right ankle, initial encounter: Secondary | ICD-10-CM

## 2023-04-15 DIAGNOSIS — M25571 Pain in right ankle and joints of right foot: Secondary | ICD-10-CM | POA: Diagnosis not present

## 2023-04-17 ENCOUNTER — Telehealth: Payer: Self-pay | Admitting: Internal Medicine

## 2023-04-17 NOTE — Telephone Encounter (Signed)
Pt c/o BP issue: STAT if pt c/o blurred vision, one-sided weakness or slurred speech  1. What are your last 5 BP readings? 203/83: 115/52  2. Are you having any other symptoms (ex. Dizziness, headache, blurred vision, passed out)? Dizziness, headache, blurred vision   3. What is your BP issue?

## 2023-04-17 NOTE — Telephone Encounter (Signed)
Spoke with pt's wife pt is weak and very shaky and B/p running all over the place While on phone B/P was 141/120 and yesterday had readings of 111/52 and 119/72 Pt noted dizziness yesterday when getting out of chair also required assistance very unsteady Per wife pt's meds were increased a month ago  then decreased recently due to low B/P Will forward to Dr Graciela Husbands and Bernadene Person NP for review .Instructed if S/S worsen to go to ED for eval and tx ./cy

## 2023-04-22 NOTE — Telephone Encounter (Signed)
Pt's wife is aware of the recommendations from Bernadene Person NP Pt is also going to try and take meds at different times during day Entresto am and pm and Amlodipine and Spironolactone mid day Will await any recommendations for Dr Klein./cy

## 2023-04-23 DIAGNOSIS — M19111 Post-traumatic osteoarthritis, right shoulder: Secondary | ICD-10-CM | POA: Diagnosis not present

## 2023-04-25 NOTE — Telephone Encounter (Signed)
Connor Wilkerson, you are scheduled to see them 7/2.  I am glad to touch base with you by phone that day.  A couple of generic thoughts 1-amlodipine has a very long half-life and so using it and for titration is not helpful.  Sometimes a low-dose of amlodipine in the background 2.5 mg for example can be used but 2-if you need acute management of blood pressure you want to titrate blood pressure medications and response to measurements, I would hydralazine

## 2023-04-30 DIAGNOSIS — S92515A Nondisplaced fracture of proximal phalanx of left lesser toe(s), initial encounter for closed fracture: Secondary | ICD-10-CM | POA: Diagnosis not present

## 2023-04-30 DIAGNOSIS — M2042 Other hammer toe(s) (acquired), left foot: Secondary | ICD-10-CM | POA: Diagnosis not present

## 2023-04-30 DIAGNOSIS — S82874A Nondisplaced pilon fracture of right tibia, initial encounter for closed fracture: Secondary | ICD-10-CM | POA: Diagnosis not present

## 2023-04-30 DIAGNOSIS — S93491A Sprain of other ligament of right ankle, initial encounter: Secondary | ICD-10-CM | POA: Diagnosis not present

## 2023-05-05 ENCOUNTER — Encounter: Payer: Self-pay | Admitting: Nurse Practitioner

## 2023-05-05 ENCOUNTER — Ambulatory Visit: Payer: Medicare PPO | Attending: Cardiology | Admitting: Nurse Practitioner

## 2023-05-05 VITALS — BP 94/52 | HR 62 | Ht 70.0 in | Wt 153.0 lb

## 2023-05-05 DIAGNOSIS — I951 Orthostatic hypotension: Secondary | ICD-10-CM

## 2023-05-05 DIAGNOSIS — I480 Paroxysmal atrial fibrillation: Secondary | ICD-10-CM

## 2023-05-05 DIAGNOSIS — N1832 Chronic kidney disease, stage 3b: Secondary | ICD-10-CM

## 2023-05-05 DIAGNOSIS — E785 Hyperlipidemia, unspecified: Secondary | ICD-10-CM

## 2023-05-05 DIAGNOSIS — I2510 Atherosclerotic heart disease of native coronary artery without angina pectoris: Secondary | ICD-10-CM | POA: Diagnosis not present

## 2023-05-05 DIAGNOSIS — Z95810 Presence of automatic (implantable) cardiac defibrillator: Secondary | ICD-10-CM

## 2023-05-05 DIAGNOSIS — I6523 Occlusion and stenosis of bilateral carotid arteries: Secondary | ICD-10-CM | POA: Diagnosis not present

## 2023-05-05 DIAGNOSIS — I10 Essential (primary) hypertension: Secondary | ICD-10-CM

## 2023-05-05 DIAGNOSIS — I255 Ischemic cardiomyopathy: Secondary | ICD-10-CM

## 2023-05-05 DIAGNOSIS — I739 Peripheral vascular disease, unspecified: Secondary | ICD-10-CM | POA: Diagnosis not present

## 2023-05-05 MED ORDER — HYDRALAZINE HCL 25 MG PO TABS: ORAL_TABLET | ORAL | 3 refills | Status: DC

## 2023-05-05 NOTE — Progress Notes (Signed)
Office Visit    Patient Name: Connor Wilkerson Date of Encounter: 05/05/2023  Primary Care Provider:  Doran Stabler, NP Primary Cardiologist:  Sherryl Manges, MD  Chief Complaint    82 year old male with a history of CAD s/p CABG in 1999, ICM s/p ICD, paroxysmal atrial fibrillation, orthostatic hypotension, hypertension, hyperlipidemia, carotid artery stenosis, and CKD stage III who presents for follow-up related to CAD and hypertension.   Past Medical History    Past Medical History:  Diagnosis Date   Abnormality of gait 02/07/2015   AICD (automatic cardioverter/defibrillator) present    [employer] - Graciela Husbands   CAD (coronary artery disease)    Hx of Inf MI treated with CABG in 1999 // Myoview 11/16: mild inf-lat ischemia, EF 34, high risk // LHC 3/19: 3/3 bypass grafts patent   Carotid artery disease (HCC)    Followed by [facility] (Dr. Myra Gianotti) // [place] in 2/19: bilat ICA 40-59   Chronic systolic CHF (congestive heart failure) (HCC) 01/01/2010   Echo 12/30/2017: Mild LVH, EF 30-35, diffuse HK, grade 1 diastolic dysfunction, mildly reduced RVSF // Echo 04/12/2016:  EF 35-40, PASP 39, mild to moderate TR, mild LAE // Echo (12/14):  Base/mid inferior and inferolateral HK, EF 45%, mild LAE, mildly reduced RVSF   CKD (chronic kidney disease)    Depression    HLP (hyperkeratosis lenticularis perstans)    HTN (hypertension)    ICD (implantable cardiac defibrillator)  BSx    [employer]   Ischemic cardiomyopathy    Myocardial infarction (HCC) 1999   Near syncope 09/21/2014   Orthostatic hypotension    PAF (paroxysmal atrial fibrillation) (HCC)    Presence of permanent cardiac pacemaker    Wears partial dentures    upper   Past Surgical History:  Procedure Laterality Date   CARDIAC CATHETERIZATION N/A 12/11/2015   Procedure: Left Heart Cath and Coronary Angiography;  Surgeon: Rinaldo Cloud, MD;  Location: [facility] INVASIVE CV LAB;  Service: Cardiovascular;  Laterality: N/A;   CARDIAC  DEFIBRILLATOR PLACEMENT     [employer]   CARDIAC DEFIBRILLATOR PLACEMENT  04/08/2016   NOT MRI SAFE (DOCUMENT SCANNED IN SYSTEM   COLONOSCOPY  10/06/2007   Mild sigmoid diverticulosis. Small internal hemorrhoids.    CORONARY ARTERY BYPASS GRAFT     ELBOW SURGERY Left    plastic bone replacement   HERNIA REPAIR     x2   LEFT HEART CATH AND CORS/GRAFTS ANGIOGRAPHY N/A 01/07/2018   Procedure: LEFT HEART CATH AND CORS/GRAFTS ANGIOGRAPHY;  Surgeon: Kathleene Hazel, MD;  Location: [facility] IN[facility]AS[facility] CV [facility];  Service: Cardiovascular;  Laterality: N/A;   PERMANENT PACEMAKER GENERATOR CHANGE N/A 11/25/2013   Procedure: PERMANENT PACEMAKER GENERATOR CHANGE;  Surgeon: Duke Salvia, MD;  Location: [facility] CATH LAB;  Service: Cardiovascular;  Laterality: N/A;    ROTATOR CUFF REPAIR Right 2006   SINUS EXPLORATION  1987   ULNAR NERVE TRANSPOSITION Left 07/19/2019   Procedure: Ulnar nerve release at the elbow with anterior transposition and flexor pronator lengthening, anterior interosseous nerve transfer to the deep motor branch of the ulnar nerve at the forearm, flexor digitorum profundus tendon transfer of the ring and small to middle fingers, ulnar nerve release at the wrist, Guyons canal;  Surgeon: Dominica Severin, MD;  Location: [facility] OR;  Service: Orthoped   ULTRASOUND GUIDANCE FOR VASCULAR ACCESS  01/07/2018   Procedure: Ultrasound Guidance For Vascular Access;  Surgeon: Kathleene Hazel, MD;  Location: [facility] INVASIVE CV LAB;  Service: Cardiovascular;;  Allergies  Allergies  Allergen Reactions   Tramadol Other (See Comments)    Unknown      Labs/Other Studies Reviewed    The following studies were reviewed today:  Cardiac Studies & Procedures   CARDIAC CATHETERIZATION  CARDIAC CATHETERIZATION 01/07/2018  Narrative  Prox RCA lesion is 100% stenosed.  SVG graft was visualized by angiography and is normal in caliber.  Post Atrio lesion is 60% stenosed.  Prox Cx lesion is 100%  stenosed.  SVG graft was visualized by angiography and is normal in caliber.  LIMA graft was visualized by angiography and is normal in caliber.  Ost LAD lesion is 40% stenosed.  Prox LAD lesion is 40% stenosed.  Prox LAD to Mid LAD lesion is 90% stenosed.  1. Severe triple vessel CAD s/p 3V CABG with 3/3 patent bypass grafts 2. Severe stenosis mid LAD. The mid and distal LAD fills from the LIMA graft 3. The mid Circumflex is chronically occluded. The OM branch fills from the patent vein graft 4. The RCA is chronically occluded just beyond the ostium. The distal RCA and PDA fills from the patent vein graft. The native PDA has moderate stenosis beyond the graft insertion which is unchanged from cardiac cath in 2017.  Recommendations: Continue medical management of CAD.  Findings Coronary Findings Diagnostic  Dominance: Right  Left Anterior Descending Vessel is large. Ost LAD lesion is 40% stenosed. Prox LAD lesion is 40% stenosed. Prox LAD to Mid LAD lesion is 90% stenosed.  Left Circumflex Vessel is moderate in size. Prox Cx lesion is 100% stenosed. The lesion is chronically occluded.  Second Obtuse Marginal Branch Vessel is moderate in size.  Right Coronary Artery Vessel is moderate in size. Prox RCA lesion is 100% stenosed. The lesion is chronically occluded.  Right Posterior Atrioventricular Artery Post Atrio lesion is 60% stenosed.  Saphenous Graft To Dist RCA SVG graft was visualized by angiography and is normal in caliber.  Saphenous Graft To 2nd Mrg SVG graft was visualized by angiography and is normal in caliber.  LIMA LIMA Graft To Mid LAD LIMA graft was visualized by angiography and is normal in caliber.  Intervention  No interventions have been documented.   CARDIAC CATHETERIZATION  CARDIAC CATHETERIZATION 12/11/2015  Narrative  Ost RCA to Mid RCA lesion, 100% stenosed.  Ost RPDA lesion, 70% stenosed.  Ost Cx lesion, 60% stenosed.  Prox Cx  lesion, 80% stenosed.  Prox Cx to Mid Cx lesion, 100% stenosed.  Ost LM lesion, 40% stenosed.  Prox LAD lesion, 40% stenosed.  Mid LAD-1 lesion, 70% stenosed.  Mid LAD-2 lesion, 100% stenosed.  Findings Coronary Findings Diagnostic  Dominance: Right  Left Main  Left Anterior Descending  Left Circumflex  Right Coronary Artery  Right Posterior Descending Artery  Graft To Ost RPDA  Sequential Graft To 1st Mrg, 3rd Mrg  LIMA Graft To Dist LAD  Intervention  No interventions have been documented.   STRESS TESTS  NM MYOCAR MULTI W/SPECT W 09/12/2015  Narrative  Defect 1: There is a medium defect of mild severity present in the basal inferolateral and mid inferolateral location.  Findings consistent with ischemia.  The left ventricular ejection fraction is moderately decreased (30-44%).  Nuclear stress EF: 34%.  This is a high risk study.   ECHOCARDIOGRAM  ECHOCARDIOGRAM COMPLETE 12/30/2017  Narrative *[facility]* [address] [place], [place] [address] [phone]  ------------------------------------------------------------------- Transthoracic Echocardiography  Patient:    Jerit, Tacker MR #:  [id number] Study Date: 12/30/2017 Gender:     M Age:        77 Height:     177.8 cm Weight:     78.4 kg BSA:        1.98 m^2 Pt. Status: Room:  ATTENDING    Kristeen Miss, M.D. ORDERING     Sherryl Manges REFERRING    Sherryl Manges SONOGRAPHER  Clearence Ped, RCS PERFORMING   [facility], Outpatient  cc:  ------------------------------------------------------------------- LV EF: 30% -   35%  ------------------------------------------------------------------- Indications:      Ischemic Cardiomyopathy (I25.5).  ------------------------------------------------------------------- History:   PMH:  ICD.  Atrial fibrillation.  PMH:   Myocardial infarction.  ------------------------------------------------------------------- Study  Conclusions  - Left ventricle: The cavity size was normal. Wall thickness was increased in a pattern of mild LVH. Systolic function was moderately to severely reduced. The estimated ejection fraction was in the range of 30% to 35%. Diffuse hypokinesis. Doppler parameters are consistent with abnormal left ventricular relaxation (grade 1 diastolic dysfunction). - Right ventricle: Systolic function was mildly reduced.  ------------------------------------------------------------------- Study data:  Comparison was made to the study of 04/11/2016.  Study status:  Routine.  Procedure:  Transthoracic echocardiography. Image quality was adequate.          Transthoracic echocardiography. M-mode, complete 2D,3D, spectral Doppler, and color Doppler.  Birthdate:  Patient birthdate: 19-Sep-1941.  Age: Patient is 82 yr old.  Sex:  Gender: male.    BMI: 24.8 kg/m^2. Blood pressure:     144/90  Patient status:  Outpatient.  Study date:  Study date: 12/30/2017. Study time: 11:49 AM.  Location: [facility]  -------------------------------------------------------------------  ------------------------------------------------------------------- Left ventricle:  The cavity size was normal. Wall thickness was increased in a pattern of mild LVH. Systolic function was moderately to severely reduced. The estimated ejection fraction was in the range of 30% to 35%. Diffuse hypokinesis. Doppler parameters are consistent with abnormal left ventricular relaxation (grade 1 diastolic dysfunction).  ------------------------------------------------------------------- Aortic valve:   Structurally normal valve.   Cusp separation was normal.  Doppler:  Transvalvular velocity was within the normal range. There was no stenosis. There was no regurgitation.  ------------------------------------------------------------------- Aorta:  Aortic root: The aortic root was normal in size. Ascending aorta: The ascending  aorta was normal in size.  ------------------------------------------------------------------- Mitral valve:   Structurally normal valve.   Leaflet separation was normal.  Doppler:  Transvalvular velocity was within the normal range. There was no evidence for stenosis. There was no regurgitation.  ------------------------------------------------------------------- Left atrium:  The atrium was normal in size.  ------------------------------------------------------------------- Right ventricle:  Pacer wire or catheter noted in right ventricle. Systolic function was mildly reduced.  ------------------------------------------------------------------- Pulmonic valve:    The valve appears to be grossly normal. Doppler:  There was no significant regurgitation.  ------------------------------------------------------------------- Tricuspid valve:   The valve appears to be grossly normal. Doppler:  There was mild regurgitation.  ------------------------------------------------------------------- Pulmonary artery:   Systolic pressure was within the normal range.  ------------------------------------------------------------------- Right atrium:  The atrium was normal in size.  ------------------------------------------------------------------- Pericardium:  There was no pericardial effusion.  ------------------------------------------------------------------- Systemic veins: Inferior vena cava: The vessel was normal in size. The respirophasic diameter changes were in the normal range (= 50%), consistent with normal central venous pressure. Diameter: 17 mm.  ------------------------------------------------------------------- Measurements  IVC  Value        Reference ID                                         17    mm     ----------  Left ventricle                             Value        Reference LV ID, ED, PLAX chordal                    45.5   mm     43 - 52 LV ID, ES, PLAX chordal            (H)     40.8  mm     23 - 38 LV fx shortening, PLAX chordal     (L)     10    %      >=29 LV PW thickness, ED                        13.3  mm     ---------- IVS/LV PW ratio, ED                        0.97         <=1.3 Stroke volume, 2D                          40    ml     ---------- Stroke volume/bsa, 2D                      20    ml/m^2 ---------- LV e&', lateral                             8.81  cm/s   ---------- LV E/e&', lateral                           4.79         ---------- LV e&', medial                              4.24  cm/s   ---------- LV E/e&', medial                            9.95         ---------- LV e&', average                             6.53  cm/s   ---------- LV E/e&', average                           6.47         ----------  Ventricular septum                         Value        Reference IVS thickness, ED  12.9  mm     ----------  LVOT                                       Value        Reference LVOT ID, S                                 20    mm     ---------- LVOT area                                  3.14  cm^2   ---------- LVOT peak velocity, S                      65.8  cm/s   ---------- LVOT mean velocity, S                      41.3  cm/s   ---------- LVOT VTI, S                                12.7  cm     ----------  Aorta                                      Value        Reference Aortic root ID, ED                         31    mm     ----------  Left atrium                                Value        Reference LA ID, A-P, ES                             48    mm     ---------- LA ID/bsa, A-P                     (H)     2.43  cm/m^2 <=2.2 LA volume, S                               53.6  ml     ---------- LA volume/bsa, S                           27.1  ml/m^2 ---------- LA volume, ES, 1-p A4C                     48.5  ml     ---------- LA volume/bsa, ES, 1-p A4C                  24.5  ml/m^2 ---------- LA volume, ES, 1-p A2C  59.7  ml     ---------- LA volume/bsa, ES, 1-p A2C                 30.2  ml/m^2 ----------  Mitral valve                               Value        Reference Mitral E-wave peak velocity                42.2  cm/s   ---------- Mitral A-wave peak velocity                63.1  cm/s   ---------- Mitral deceleration time           (H)     317   ms     150 - 230 Mitral E/A ratio, peak                     0.7          ----------  Pulmonary arteries                         Value        Reference PA pressure, S, DP                         23    mm Hg  <=30  Tricuspid valve                            Value        Reference Tricuspid regurg peak velocity             224   cm/s   ---------- Tricuspid peak RV-RA gradient              20    mm Hg  ---------- Tricuspid maximal regurg velocity,         224   cm/s   ---------- PISA  Right atrium                               Value        Reference RA ID, S-I, ES, A4C                (H)     51.4  mm     34 - 49 RA area, ES, A4C                           14.4  cm^2   8.3 - 19.5 RA volume, ES, A/L                         33.4  ml     ---------- RA volume/bsa, ES, A/L                     16.9  ml/m^2 ----------  Systemic veins                             Value        Reference Estimated CVP  3     mm Hg  ----------  Right ventricle                            Value        Reference TAPSE                                      16    mm     ---------- RV pressure, S, DP                         23    mm Hg  <=30 RV s&', lateral, S                          6.96  cm/s   ----------  Legend: (L)  and  (H)  mark values outside specified reference range.  ------------------------------------------------------------------- Prepared and Electronically Authenticated by  Kristeen Miss, M.D. 2019-02-27T15:06:51            Recent Labs: 07/23/2022: BUN 24;  Creatinine, Ser 2.01; Hemoglobin 12.5; Platelets 177; Potassium 4.6; Sodium 142  Recent Lipid Panel    Component Value Date/Time   CHOL 156 12/11/2015 0529   TRIG 112 12/11/2015 0529   HDL 47 12/11/2015 0529   CHOLHDL 3.3 12/11/2015 0529   VLDL 22 12/11/2015 0529   LDLCALC 87 12/11/2015 0529    History of Present Illness    82 year old male with the above past medical history including CAD s/p CABG in 1999, ICM s/p ICD, paroxysmal atrial fibrillation, orthostatic hypotension, hypertension, hyperlipidemia, carotid artery stenosis, and CKD stage III.   He has followed primarily with Dr. Graciela Husbands for management of his ICD (implanted in 2006 for primary for management in setting of ICM).  He has a history of remote CABG in 1999.  Cardiac catheterization in 2017 revealed patent grafts.  Most recent cardiac catheterization in 2019 again revealed patent grafts.  Echocardiogram in 04/2022 revealed normal LV systolic function.  Additionally, he has a history of paroxysmal atrial fibrillation on Eliquis.  He has a history of moderate bilateral carotid artery stenosis, follows with vascular surgery.  He also follows with nephrology for history of CKD.  He was last seen in the office on 12/11/2022 and was stable from a cardiac standpoint.  He contacted our office on 03/13/2023 with concerns for hypotension.  He was last seen in the office on 03/27/2023 and noted labile BP, orthostatic dizziness and near syncope.  Amlodipine was decreased to 2.5 mg daily.  Spironolactone was decreased to 12.5 mg daily.  Amlodipine was increased 5 mg daily in the setting of ongoing elevated BP.  It was noted that should he have ongoing labile BP, amlodipine could be decreased to 2.5 mg with possible addition of hydralazine.   He presents today for follow-up accompanied by his wife.  Since his last visit he has been stable overall from a cardiac standpoint.  He did decrease his amlodipine to 2.5 mg daily but has continued to note labile  BP with SBP generally ranging from the 120s to 190s.  He has not been checking his BP every day, but rather, 1-2 times a week. He notes occasional lightheadedness with position changes, denies any chest pain, dyspnea, palpitations, presyncope, syncope, edema, PND, orthopnea, weight gain.  He notes he  is hoping to have shoulder surgery in the next several months but plans to wait until his broken foot heals.  Overall, he reports feeling well.  Home Medications    Current Outpatient Medications  Medication Sig Dispense Refill   amLODipine (NORVASC) 2.5 MG tablet Take 2 tablets (5 mg total) by mouth daily. (Patient taking differently: Take 2.5 mg by mouth daily.) 180 tablet 3   apixaban (ELIQUIS) 2.5 MG TABS tablet TAKE 1 TABLET BY MOUTH TWICE A DAY 180 tablet 1   buPROPion (WELLBUTRIN XL) 150 MG 24 hr tablet Take 1 tablet by mouth every morning.     escitalopram (LEXAPRO) 10 MG tablet Take 10 mg by mouth 2 (two) times daily.     famotidine (PEPCID) 20 MG tablet Take 20 mg by mouth 2 (two) times daily.     gabapentin (NEURONTIN) 300 MG capsule Take 300 mg by mouth 3 (three) times daily.     hydrALAZINE (APRESOLINE) 25 MG tablet Take 1 tablet twice daily as needed for systolic blood pressure (top number) greater than 160. 180 tablet 3   nitroGLYCERIN (NITROSTAT) 0.4 MG SL tablet Place 1 tablet (0.4 mg total) under the tongue every 5 (five) minutes as needed for chest pain. 25 tablet 11   rosuvastatin (CRESTOR) 40 MG tablet TAKE 1 TABLET BY MOUTH EVERY DAY 90 tablet 2   sacubitril-valsartan (ENTRESTO) 24-26 MG Take 0.5 tablets by mouth 2 (two) times daily. 90 tablet 3   spironolactone (ALDACTONE) 25 MG tablet TAKE 1 TABLET (25 MG TOTAL) BY MOUTH DAILY AT NIGHT ONLY (Patient taking differently: Take 12.5 mg by mouth daily.) 90 tablet 3   traZODone (DESYREL) 50 MG tablet Take 50 mg by mouth at bedtime as needed for sleep.     cyclobenzaprine (FLEXERIL) 5 MG tablet Take 5 mg by mouth 3 (three) times daily  as needed for muscle spasms. (Patient not taking: Reported on 05/05/2023)     oxyCODONE-acetaminophen (PERCOCET) 10-325 MG tablet Take 1 tablet by mouth 4 (four) times daily as needed for pain. (Patient not taking: Reported on 05/05/2023)     No current facility-administered medications for this visit.     Review of Systems    He denies chest pain, palpitations, dyspnea, pnd, orthopnea, n, v, syncope, edema, weight gain, or early satiety. All other systems reviewed and are otherwise negative except as noted above.   Physical Exam    VS:  BP (!) 94/52 (BP Location: Right Arm, Patient Position: Sitting, Cuff Size: Normal)   Pulse 62   Ht 5\' 10"  (1.778 m)   Wt 153 lb (69.4 kg)   SpO2 98%   BMI 21.95 kg/m   GEN: Well nourished, well developed, in no acute distress. HEENT: normal. Neck: Supple, no JVD, carotid bruits, or masses. Cardiac: RRR, no murmurs, rubs, or gallops. No clubbing, cyanosis, edema.  Radials/DP/PT 2+ and equal bilaterally.  Respiratory:  Respirations regular and unlabored, clear to auscultation bilaterally. GI: Soft, nontender, nondistended, BS + x 4. MS: no deformity or atrophy. Skin: warm and dry, no rash. Neuro:  Strength and sensation are intact. Psych: Normal affect.  Accessory Clinical Findings    ECG personally reviewed by me today - EKG Interpretation Date/Time:  Tuesday May 05 2023 13:58:33 EDT Ventricular Rate:  62 PR Interval:  184 QRS Duration:  94 QT Interval:  400 QTC Calculation: 406 R Axis:   38  Text Interpretation: Normal sinus rhythm Cannot rule out Anterior infarct , age undetermined When compared with ECG  of 22-Dec-2020 00:13, PREVIOUS ECG IS PRESENT Confirmed by Bernadene Person ([id number]) on 05/05/2023 2:57:33 PM.    Lab Results  Component Value Date   WBC 6.7 07/23/2022   HGB 12.5 (L) 07/23/2022   HCT 36.6 (L) 07/23/2022   MCV 99 (H) 07/23/2022   PLT 177 07/23/2022   Lab Results  Component Value Date   CREATININE 2.01 (H) 07/23/2022    BUN 24 07/23/2022   NA 142 07/23/2022   K 4.6 07/23/2022   CL 106 07/23/2022   CO2 21 07/23/2022   Lab Results  Component Value Date   ALT 19 12/22/2020   AST 27 12/22/2020   ALKPHOS 89 12/22/2020   BILITOT 0.3 12/22/2020   Lab Results  Component Value Date   CHOL 156 12/11/2015   HDL 47 12/11/2015   LDLCALC 87 12/11/2015   TRIG 112 12/11/2015   CHOLHDL 3.3 12/11/2015    No results found for: "HGBA1C"  Assessment & Plan    1. Hypertension/orthostatic hypotension: He has a history of labile BP with orthostatic symptoms and near syncope.  He notes occasional dizziness with position changes, denies any presyncope, syncope.  BP remains labile.  Chart reviewed by Dr. Graciela Husbands who recommended decreasing amlodipine to 2.5 mg daily.  Patient has already done this.  Will add hydralazine 25 mg twice daily as needed for SBP greater than 160.  Continue to monitor BP and report SBP consistently less than 100 or greater than 170.  Discussed adequate hydration, gradual position changes, abdominal binder, thigh sleeves.  Continue amlodipine, Entresto, spironolactone at current doses.   2. CAD: S/p remote CABG. Stable with no anginal symptoms. No indication for ischemic evaluation.  No ASA in the setting of chronic DOAC therapy. Continue amlodipine, Entresto, spironolactone and Crestor.     3. ICM: S/p ICD. Most recent echo in 04/2022 revealed normal LV systolic function. Euvolemic and well compensated on exam.  Continue current medications as above. Follows with Dr. Graciela Husbands, EP.    4. Paroxysmal atrial fibrillation: Denies any recent palpitations.  Continue Eliquis.   5. Hyperlipidemia: LDL was 40 in 02/2023.  Continue Crestor.   6. Carotid artery stenosis/PAD: Most recent carotid ultrasound in 06/2022 revealed 40 to 59% BICA stenosis.  Follows with vascular surgery/Dr. Allyson Sabal.   7. CKD stage IIIb: Creatinine was 2.49 in 03/2023.  Consider repeat BMET at next follow-up visit.   8. Disposition: Follow-up  as scheduled with Dr. Allyson Sabal in 08/2023.  Follow-up with EP per recall.  He notes he hopes to have shoulder surgery in the coming months.  According to the Revised Cardiac Risk Index (RCRI), his Perioperative Risk of Major Cardiac Event is (%): 6.6. His Functional Capacity in METs is: 3.63 according to the [employer] Activity Status Index (DASI).  He is unable to complete greater than 4 METS at baseline though he is recovering from a broken foot.  Can reevaluate METS closer to surgery, though he is aware he may require additional testing for clearance.       Joylene Grapes, NP 05/05/2023, 2:57 PM

## 2023-05-05 NOTE — Patient Instructions (Addendum)
Medication Instructions:  Start Hydralazine 25 mg Take 1 tablet twice daily as needed for systolic blood pressure (top number) greater than 160.   *If you need a refill on your cardiac medications before your next appointment, please call your pharmacy*   Lab Work: NONE ordered at this time of appointment     Testing/Procedures: NONE ordered at this time of appointment     Follow-Up: At [facility], you and your health needs are our priority.  As part of our continuing mission to provide you with exceptional heart care, we have created designated Provider Care Teams.  These Care Teams include your primary Cardiologist (physician) and Advanced Practice Providers (APPs -  Physician Assistants and Nurse Practitioners) who all work together to provide you with the care you need, when you need it.  We recommend signing up for the patient portal called "MyChart".  Sign up information is provided on this After Visit Summary.  MyChart is used to connect with patients for Virtual Visits (Telemedicine).  Patients are able to view lab/test results, encounter notes, upcoming appointments, etc.  Non-urgent messages can be sent to your provider as well.   To learn more about what you can do with MyChart, go to [email or web address].    Your next appointment:    Keep follow up   Provider:   Dr. Allyson Sabal    Other Instructions Monitor blood pressure. Please report systolic BP (top number) consistently less than 100.

## 2023-05-08 DIAGNOSIS — M79672 Pain in left foot: Secondary | ICD-10-CM | POA: Diagnosis not present

## 2023-05-08 DIAGNOSIS — S82874A Nondisplaced pilon fracture of right tibia, initial encounter for closed fracture: Secondary | ICD-10-CM | POA: Diagnosis not present

## 2023-05-08 DIAGNOSIS — M25571 Pain in right ankle and joints of right foot: Secondary | ICD-10-CM | POA: Diagnosis not present

## 2023-05-08 DIAGNOSIS — T148XXA Other injury of unspecified body region, initial encounter: Secondary | ICD-10-CM | POA: Diagnosis not present

## 2023-05-08 DIAGNOSIS — M2042 Other hammer toe(s) (acquired), left foot: Secondary | ICD-10-CM | POA: Diagnosis not present

## 2023-05-12 ENCOUNTER — Telehealth: Payer: Self-pay | Admitting: Internal Medicine

## 2023-05-12 NOTE — Telephone Encounter (Signed)
Pt c/o medication issue:  1. Name of Medication: Eliquis abd Famotidine and Entresto  Famotidine 2. How are you currently taking this medication (dosage and times per day)?   3. Are you having a reaction (difficulty breathing--STAT)?   4. What is your medication issue? Patient took 2 Eliquis instead of 1 did the same thing for his Famotidine- Entresto he took 1 tablet instead of a half tablet-- what does he need to do?a      Patient blood pressure is running low.- we wre disconnected before he could me the reading. I kept calling back, first the line was busy, later no answer   Pt c/o BP issue: STAT if pt c/o blurred vision, one-sided weakness or slurred speech  1. What are your last 5 BP readings?   2. Are you having any other symptoms (ex. Dizziness, headache, blurred vision, passed out)?   3. What is your BP issue?

## 2023-05-12 NOTE — Telephone Encounter (Signed)
Spoke with wife from My chart message.  Patient took his morning and evening meds at 11 am.  His BP diastolic has been running low per the message.  She states he has no dizziness,, not lightheaded nor SOB.  Took BP while on the phone and is 117/56 HR 60.  Discussed with DOD regarding the medications.  He reviews BP and meds.  Recommendation to hold those 3 meds tonight and resume normal direction tomorrow..   Notified wife, she states understanding.  Advised to continue hydration, he may have some salty snack in moderation. If his BP continues to drop and he has dizziness, lightheaded, SOB, or confusion to call 911 for transport.  She states understanding of all.

## 2023-05-12 NOTE — Telephone Encounter (Signed)
I returned call to [employer]. Reviewed that patient took TWO famotidine 20 mg, whole Entresto 24-26 and TWO Eliquis 2.5 mg tablets this am.  She is concerned w his BP readings. I adv to have him change positions slowly and eat a salty snack today.  Adv to encourage extra fluids today if he feels lightheaded.    Triage nurse called her from other office with medication recommendations as per patient message.

## 2023-05-19 DIAGNOSIS — H25813 Combined forms of age-related cataract, bilateral: Secondary | ICD-10-CM | POA: Diagnosis not present

## 2023-05-19 DIAGNOSIS — H52223 Regular astigmatism, bilateral: Secondary | ICD-10-CM | POA: Diagnosis not present

## 2023-05-19 DIAGNOSIS — H5203 Hypermetropia, bilateral: Secondary | ICD-10-CM | POA: Diagnosis not present

## 2023-05-19 DIAGNOSIS — H524 Presbyopia: Secondary | ICD-10-CM | POA: Diagnosis not present

## 2023-05-21 DIAGNOSIS — S82874A Nondisplaced pilon fracture of right tibia, initial encounter for closed fracture: Secondary | ICD-10-CM | POA: Diagnosis not present

## 2023-05-21 DIAGNOSIS — M2042 Other hammer toe(s) (acquired), left foot: Secondary | ICD-10-CM | POA: Diagnosis not present

## 2023-05-22 ENCOUNTER — Telehealth: Payer: Self-pay | Admitting: *Deleted

## 2023-05-22 NOTE — Telephone Encounter (Signed)
Patient now scheduled for Tuesday June 02, 2023 for In office preop clearance

## 2023-05-22 NOTE — Telephone Encounter (Signed)
Pt was seen 7/2 and this hs been addressed see office visit .Zack Seal

## 2023-05-22 NOTE — Telephone Encounter (Signed)
   Pre-operative Risk Assessment    Patient Name: Connor Wilkerson  DOB: 23-Dec-1940 MRN: [id number]      Request for Surgical Clearance    Procedure:   LEFT 2ND TOE AMPUTATION THRU PROXIMAL PHALANX vs 2ND MTP DISARTICULATION  Date of Surgery:  Clearance TBD                                 Surgeon:  DR. [name] Surgeon's Group or Practice Name:  [facility] Phone number:  [phone] ATTN: MEGAN DAVIS Fax number:  [phone]   Type of Clearance Requested:   - Medical  - Pharmacy:  Hold Apixaban (Eliquis)     Type of Anesthesia:  Not Indicated (GENERAL?)   Additional requests/questions:    Elpidio Anis   05/22/2023, 1:31 PM

## 2023-05-22 NOTE — Telephone Encounter (Signed)
Patient with diagnosis of afib on Eliquis for anticoagulation.    Procedure: left second toe amputation through proximal phalanx vs 2nd MTP disarticulation  Date of procedure: TBD  CHA2DS2-VASc Score = 5  This indicates a 7.2% annual risk of stroke. The patient's score is based upon: CHF History: 1 HTN History: 1 Diabetes History: 0 Stroke History: 0 Vascular Disease History: 1 Age Score: 2 Gender Score: 0   CrCl 74mL/min Platelet count 177K  Per office protocol, patient can hold Eliquis for 2 days prior to procedure.    **This guidance is not considered finalized until pre-operative APP has relayed final recommendations.**

## 2023-05-22 NOTE — Telephone Encounter (Signed)
   Name: KEYONTAY STOLZ  DOB: 08/08/1941  MRN: [id number]  Primary Cardiologist: Sherryl Manges, MD  Chart reviewed as part of pre-operative protocol coverage. Because of Emon Lance Colquhoun's past medical history and time since last visit, he will require a follow-up in-office visit in order to better assess preoperative cardiovascular risk.  Pre-op covering staff: - Please schedule appointment and call patient to inform them. If patient already had an upcoming appointment within acceptable timeframe, please add "pre-op clearance" to the appointment notes so provider is aware. - Please contact requesting surgeon's office via preferred method (i.e, phone, fax) to inform them of need for appointment prior to surgery.  This message will also be routed to pharmacy pool for input on holding Eliquis as requested below so that this information is available to the clearing provider at time of patient's appointment.   Joylene Grapes, NP  05/22/2023, 2:54 PM

## 2023-05-25 ENCOUNTER — Other Ambulatory Visit (HOSPITAL_COMMUNITY): Payer: Self-pay | Admitting: Internal Medicine

## 2023-05-25 DIAGNOSIS — I480 Paroxysmal atrial fibrillation: Secondary | ICD-10-CM

## 2023-05-25 NOTE — Telephone Encounter (Signed)
Prescription refill request for Eliquis received. Indication:afib Last office visit:7/24 Scr:2.0  9/23 Age: 82 Weight:69.4  kg  Prescription refilled

## 2023-06-01 NOTE — Progress Notes (Unsigned)
Cardiology Clinic Note   Date: 06/02/2023 ID: Wilkerson, Connor [birth year], MRN [id number]  Primary Cardiologist:  Sherryl Manges, MD  Patient Profile    Connor Wilkerson is a 82 y.o. male who presents to the clinic today for preoperative cardiac evaluation.     Past medical history significant for: CAD. CABG x 3 1999. LHC 10/15/2010 (angina): Ostial and distal LM 10 to 20%.  Ostial and proximal LAD 30 to 40% then 100% beyond the proximal portion.  Small D3 20 to 25%.  Ostial and proximal LCx 40 to 50% then 100%.  Ostial RCA 100%.  Patent LIMA to LAD.  Patent SVG to OM.  SVG to PDA has anastomotic 80 to 85% stenosis.  The lesion is right at the bifurcation with distal RCA and PDA.  Plan to maximize antianginal medications.  If persistent angina we will consider high risk PCI to SVG to PDA with proximal protection. LHC 12/11/2015 (abnormal stress test): Ostial to mid RCA 100%.  Ostial RPDA 70%.  Ostial LCx 60%.  Proximal LCx 80%.  Proximal and mid LCx 100%.  Ostial LM 40%.  Proximal LAD 40%.  Mid LAD #1 70%, #2 100%.  Patent LIMA to LAD.  Patent SVG to OM.  SVG to PDA 70 to 80% anastomotic stenosis as previously seen.  Not suitable for PCI. LHC 01/07/2018 (ischemic cardiomyopathy): Proximal RCA 100%.  Post atrial lesion 60%.  Proximal CX 100%.  Ostial LAD 40%.  Proximal LAD 40%.  Proximal to mid LAD 90%.  3 out of 3 bypass grafts patent.  Recommend continued medical management. Chronic systolic heart failure/ischemic cardiomyopathy. ICD implantation 2006. GEN change/revision to subpectoral pocket 11/25/2013. In clinic device check 07/23/2022: Normal device function.  No ventricular arrhythmias.  Estimated longevity 3 years. Echo 04/14/2022: Technically difficult study.  EF 55 to 60%.  Mild LAE. PAF. Carotid artery disease. Carotid duplex 07/01/2022: Bilateral ICA 40 to 49%.  Diffuse heterogeneous and calcific plaque in the carotid bifurcation bilaterally.  Right subclavian artery flow was  disturbed.  Normal flow left subclavian artery. Hypertension. Hyperlipidemia. Lipid panel 02/11/2023: LDL 40, HDL 51, TG 67, total 105. OSA. CKD stage III.     History of Present Illness    LEDARRIUS Wilkerson is a longtime patient of cardiology.  He is currently followed by Dr. Graciela Husbands for management of ICD.  He has a history of CABG x 3 in 1999.  Last heart cath March 2019 showed three-vessel disease with 3 out of 3 patent grafts.  He continues to be followed for the above outlined history.  Patient was seen in the office on 03/27/2023 by Bernadene Person, NP team follow-up.  He reported an episode of lightheadedness causing him to sprained his ankle.  He had recently increased amlodipine and spironolactone.  Since that time he noted increased symptoms of orthostatic dizziness and presyncope.  Amlodipine was decreased to 2.5 mg and spironolactone decreased to 12.5 mg.  Recommended adequate hydration, gradual position changes, abdominal binder, and thigh sleeves.  Patient was last seen in the office by Bernadene Person, NP on 05/05/2023 for follow-up.  He continued to report labile BP with SBP ranging from the 120s to the 190s.  He was checking BP 1-2 times a week.  He reported occasional lightheadedness with position changes.  Hydralazine 25 mg twice daily as needed SBP > 160.  No other changes were made.  Patient is now pending left second toe amputation through proximal phalanx vs 2nd MTP disarticulation Dr. Odis Hollingshead.  Today, patient is accompanied by his wife. Patient denies shortness of breath or dyspnea on exertion. No chest pain, pressure, or tightness. Denies lower extremity edema, orthopnea, or PND. No palpitations.  His activity is limited secondary to chronic back pain but he likes to play in the yard with his Advertising account planner.  He also does some things around the house including light to moderate household chores.  Patient's wife is very concerned about plan to amputate patient's toe.  Apparently  patient scraped the top of his toe when he sprained his ankle and it was slow to heal.  There is a concern for active infection which will compromise patient's ability to undergo shoulder replacement.  Patient's wife inquires if there is other ways to determine if there is infection present.  Encourage patient to contact Dr. Odis Hollingshead to have some of her questions answered.      ROS: All other systems reviewed and are otherwise negative except as noted in History of Present Illness.  Studies Reviewed    EKG Interpretation Date/Time:  Tuesday June 02 2023 08:56:26 EDT Ventricular Rate:  65 PR Interval:  188 QRS Duration:  88 QT Interval:  396 QTC Calculation: 411 R Axis:   32  Text Interpretation: Normal sinus rhythm Normal ECG When compared with ECG of 05-May-2023 13:58, No significant change was found Confirmed by Carlos Levering [phone]) on 06/02/2023 9:02:41 AM    Risk Assessment/Calculations     CHA2DS2-VASc Score = 5   This indicates a 7.2% annual risk of stroke. The patient's score is based upon: CHF History: 1 HTN History: 1 Diabetes History: 0 Stroke History: 0 Vascular Disease History: 1 Age Score: 2 Gender Score: 0             Physical Exam    VS:  BP 112/62   Pulse 68   Ht 5\' 10"  (1.778 m)   Wt 153 lb 12.8 oz (69.8 kg)   SpO2 96%   BMI 22.07 kg/m  , BMI Body mass index is 22.07 kg/m.  GEN: Well nourished, well developed, in no acute distress. Neck: No JVD or carotid bruits. Cardiac:  RRR. No murmurs. No rubs or gallops.   Respiratory:  Respirations regular and unlabored. Clear to auscultation without rales, wheezing or rhonchi. GI: Soft, nontender, nondistended. Extremities: Radials/DP/PT 2+ and equal bilaterally. No clubbing or cyanosis. No edema.  Skin: Warm and dry, no rash. Neuro: Strength intact.  Assessment & Plan    CAD.  S/p CABG x 3 1999.  LHC March 2019 showed three-vessel CAD with 3 out of 3 patent grafts. Patient denies chest  pain, tightness, pressure.  His activity is limited by back pain but he is able to play in his backyard with his dog and perform some household activities without chest pain or shortness of breath.  Continue amlodipine, rosuvastatin. Chronic systolic heart failure/ischemic cardiomyopathy.  S/p ICD implantation 2006 with GEN change and pocket revision January 2015.  In office device check September 2023 showed normal device function.  Patient denies lower extremity edema, DOE, orthopnea, PND.  Euvolemic and well compensated on exam.  Continue spironolactone, Entresto, hydralazine as needed. PAF.  Patient denies palpitations.  EKG today shows NSR, 65 bpm.  Denies spontaneous bleeding concerns.  Continue Eliquis. Hypertension: BP today 112/62.  Patient denies headaches, dizziness or vision changes. Continue amlodipine, Entresto, hydralazine as needed. Hyperlipidemia.  LDL April 2024 40, at goal.  Continue rosuvastatin. Preoperative cardiovascular risk assessment. Left second toe amputation through proximal phalanx vs  2nd MTP disarticulation by Dr. Odis Hollingshead. According to the RCRI, patient has a 6.6% risk of MACE. Patient reports activity equivalent to 4.73 METS (per DASI). Based on ACC/AHA guidelines, FHER WYNDHAM would be at acceptable risk for the planned procedure without further cardiovascular testing.  Pharm.D., patient may hold Eliquis 2 days prior to procedure.  Disposition: Patient to return for previously scheduled visit with Dr. Allyson Sabal on 08/11/2023 or sooner as needed.         Signed, Etta Grandchild. Harvest Stanco, DNP, NP-C

## 2023-06-02 ENCOUNTER — Encounter: Payer: Self-pay | Admitting: Student

## 2023-06-02 ENCOUNTER — Ambulatory Visit: Payer: Medicare PPO | Attending: Student | Admitting: Student

## 2023-06-02 VITALS — BP 112/62 | HR 68 | Ht 70.0 in | Wt 153.8 lb

## 2023-06-02 DIAGNOSIS — E785 Hyperlipidemia, unspecified: Secondary | ICD-10-CM | POA: Diagnosis not present

## 2023-06-02 DIAGNOSIS — Z01818 Encounter for other preprocedural examination: Secondary | ICD-10-CM

## 2023-06-02 DIAGNOSIS — I480 Paroxysmal atrial fibrillation: Secondary | ICD-10-CM

## 2023-06-02 DIAGNOSIS — Z01810 Encounter for preprocedural cardiovascular examination: Secondary | ICD-10-CM | POA: Diagnosis not present

## 2023-06-02 DIAGNOSIS — I255 Ischemic cardiomyopathy: Secondary | ICD-10-CM

## 2023-06-02 DIAGNOSIS — I10 Essential (primary) hypertension: Secondary | ICD-10-CM | POA: Diagnosis not present

## 2023-06-02 DIAGNOSIS — Z95810 Presence of automatic (implantable) cardiac defibrillator: Secondary | ICD-10-CM | POA: Diagnosis not present

## 2023-06-02 DIAGNOSIS — I5022 Chronic systolic (congestive) heart failure: Secondary | ICD-10-CM | POA: Diagnosis not present

## 2023-06-02 DIAGNOSIS — I2510 Atherosclerotic heart disease of native coronary artery without angina pectoris: Secondary | ICD-10-CM | POA: Diagnosis not present

## 2023-06-02 NOTE — Patient Instructions (Signed)
Medication Instructions:  Your physician recommends that you continue on your current medications as directed. Please refer to the Current Medication list given to you today. If you need a refill on your cardiac medications before your next appointment, please call your pharmacy.   Lab Work: NONE If you have labs (blood work) drawn today and your tests are completely normal, you will receive your results only by: MyChart Message (if you have MyChart) OR A paper copy in the mail If you have any lab test that is abnormal or we need to change your treatment, we will call you to review the results.   Testing/Procedures: NONE   Follow-Up: At [facility], you and your health needs are our priority.  As part of our continuing mission to provide you with exceptional heart care, we have created designated Provider Care Teams.  These Care Teams include your primary Cardiologist (physician) and Advanced Practice Providers (APPs -  Physician Assistants and Nurse Practitioners) who all work together to provide you with the care you need, when you need it.  We recommend signing up for the patient portal called "MyChart".  Sign up information is provided on this After Visit Summary.  MyChart is used to connect with patients for Virtual Visits (Telemedicine).  Patients are able to view lab/test results, encounter notes, upcoming appointments, etc.  Non-urgent messages can be sent to your provider as well.   To learn more about what you can do with MyChart, go to [email or web address].     Provider:   Sherryl Manges, MD

## 2023-06-03 ENCOUNTER — Encounter: Payer: Self-pay | Admitting: Internal Medicine

## 2023-06-03 NOTE — Progress Notes (Signed)
PERIOPERATIVE PRESCRIPTION FOR IMPLANTED CARDIAC DEVICE PROGRAMMING  Patient Information: Name:  Connor Wilkerson  DOB:  [birth year]  MRN:  [id number]    Planned Procedure:  Left second toe amputation thru proximal plalanx versus second metatarsalphalangeal disarticulation left  Surgeon:  dr Donn Pierini  Date of Procedure:  06-17-2023  Cautery will be used.  Position during surgery: supine   Please send documentation back to:  [facility] (Fax # [phone])  Device Information:  Clinic EP Physician:  Sherryl Manges, MD   Device Type:  Pacemaker and Defibrillator Manufacturer and Phone #:  [employer]: [phone] Pacemaker Dependent?:  No. Date of Last Device Check:  07/23/2022 Normal Device Function?:  Yes.    Electrophysiologist's Recommendations:  Have magnet available. Provide continuous ECG monitoring when magnet is used or reprogramming is to be performed.  Procedure should not interfere with device function.  No device programming or magnet placement needed.  Per Device Clinic Standing Orders, [name], RN  5:17 PM 06/03/2023

## 2023-06-05 ENCOUNTER — Other Ambulatory Visit: Payer: Self-pay

## 2023-06-05 ENCOUNTER — Encounter (HOSPITAL_BASED_OUTPATIENT_CLINIC_OR_DEPARTMENT_OTHER): Payer: Self-pay | Admitting: Orthopaedic Surgery

## 2023-06-05 NOTE — Progress Notes (Signed)
Spoke w/ via phone for pre-op interview---pt Lab needs dos----   I stat            Lab results------see below COVID test -----patient states asymptomatic no test needed Arrive at -------745 am 06-17-2023 NPO after MN NO Solid Food.  Clear liquids from MN until---645 am Med rec completed Medications to take morning of surgery -----amlodipine, bupropion, entresto, escitalopram, famotidine, rosuvastatin Diabetic medication -----n/a Patient instructed no nail polish to be worn day of surgery Patient instructed to bring photo id and insurance card day of surgery Patient aware to have Driver (ride ) / caregiver   Wife pamela  for 24 hours after surgery  Patient Special Instructions -----none Pre-Op special Instructions -----none Patient verbalized understanding of instructions that were given at this phone interview. Patient denies shortness of breath, chest pain, fever, cough at this phone interview.  Anesthesia Review:ischemic cardiomyopathy, presence of AICD, cad,  mi cabg 1999, chf, paf ckd stage 3 , Reviewed patient history with dr s Desmond Lope mda, pt meets [facility] guideline per dr s Desmond Lope mda  PCP: jennifer foraman dnp [facility] Cardiologist :dr Graciela Husbands, cardiac clearance deborah wittenborn np  dated 06-02-2023 on chart.epic for 06-17-2023 surgery ICD/Pacemaker orders dated 06-03-2023 on chart  Chest x-ray :none EKG :06-02-2023 epic Echo :none Stress test:none Cardiac Cath : 01-17-2018 epic Activity level: moderate housework and plays with dog in yard Sleep Study/ CPAP :none Fasting Blood Sugar :      / Checks Blood Sugar -- times a day:  n/a Blood Thinner/ Instructions /Last Dose:eliquis stop x 2 days ASA / Instructions/ Last Dose :

## 2023-06-08 DIAGNOSIS — E7800 Pure hypercholesterolemia, unspecified: Secondary | ICD-10-CM | POA: Diagnosis not present

## 2023-06-08 DIAGNOSIS — Z125 Encounter for screening for malignant neoplasm of prostate: Secondary | ICD-10-CM | POA: Diagnosis not present

## 2023-06-08 DIAGNOSIS — Z7901 Long term (current) use of anticoagulants: Secondary | ICD-10-CM | POA: Diagnosis not present

## 2023-06-08 DIAGNOSIS — I255 Ischemic cardiomyopathy: Secondary | ICD-10-CM | POA: Diagnosis not present

## 2023-06-08 DIAGNOSIS — G2581 Restless legs syndrome: Secondary | ICD-10-CM | POA: Diagnosis not present

## 2023-06-08 DIAGNOSIS — N189 Chronic kidney disease, unspecified: Secondary | ICD-10-CM | POA: Diagnosis not present

## 2023-06-08 DIAGNOSIS — N2889 Other specified disorders of kidney and ureter: Secondary | ICD-10-CM | POA: Diagnosis not present

## 2023-06-09 ENCOUNTER — Encounter (HOSPITAL_BASED_OUTPATIENT_CLINIC_OR_DEPARTMENT_OTHER): Payer: Self-pay | Admitting: Orthopaedic Surgery

## 2023-06-09 DIAGNOSIS — N189 Chronic kidney disease, unspecified: Secondary | ICD-10-CM | POA: Diagnosis not present

## 2023-06-09 DIAGNOSIS — N2581 Secondary hyperparathyroidism of renal origin: Secondary | ICD-10-CM | POA: Diagnosis not present

## 2023-06-09 DIAGNOSIS — I5020 Unspecified systolic (congestive) heart failure: Secondary | ICD-10-CM | POA: Diagnosis not present

## 2023-06-09 DIAGNOSIS — I2510 Atherosclerotic heart disease of native coronary artery without angina pectoris: Secondary | ICD-10-CM | POA: Diagnosis not present

## 2023-06-09 DIAGNOSIS — D631 Anemia in chronic kidney disease: Secondary | ICD-10-CM | POA: Diagnosis not present

## 2023-06-09 DIAGNOSIS — K7460 Unspecified cirrhosis of liver: Secondary | ICD-10-CM | POA: Diagnosis not present

## 2023-06-09 DIAGNOSIS — I130 Hypertensive heart and chronic kidney disease with heart failure and stage 1 through stage 4 chronic kidney disease, or unspecified chronic kidney disease: Secondary | ICD-10-CM | POA: Diagnosis not present

## 2023-06-09 DIAGNOSIS — N1832 Chronic kidney disease, stage 3b: Secondary | ICD-10-CM | POA: Diagnosis not present

## 2023-06-12 DIAGNOSIS — N2889 Other specified disorders of kidney and ureter: Secondary | ICD-10-CM | POA: Diagnosis not present

## 2023-06-12 DIAGNOSIS — N401 Enlarged prostate with lower urinary tract symptoms: Secondary | ICD-10-CM | POA: Diagnosis not present

## 2023-06-16 NOTE — H&P (Signed)
ORTHOPAEDIC SURGERY H&P  Subjective:  The patient presents with left 2nd toe ulcer.   Past Medical History:  Diagnosis Date   Abnormality of gait 02/07/2015   AICD (automatic cardioverter/defibrillator) present    [employer] [place]   Arthritis    shoulders, pinched nerve around waist and back   CAD (coronary artery disease)    Hx of Inf MI treated with CABG in 1999 // Myoview 11/16: mild inf-lat ischemia, EF 34, high risk // LHC 3/19: 3/3 bypass grafts patent   Carotid artery disease (HCC)    Followed by [facility] (Dr. Myra Gianotti) // [place] in 2/19: bilat ICA 40-59   CHF (congestive heart failure) (HCC) 04/14/2023   lvef 55 to 60 % per echo   Chronic systolic CHF (congestive heart failure) (HCC) 01/01/2010   Echo 12/30/2017: Mild LVH, EF 30-35, diffuse HK, grade 1 diastolic dysfunction, mildly reduced RVSF // Echo 04/12/2016:  EF 35-40, PASP 39, mild to moderate TR, mild LAE // Echo (12/14):  Base/mid inferior and inferolateral HK, EF 45%, mild LAE, mildly reduced RVSF   CKD (chronic kidney disease) Stage 3 B    followed by dr peoples q year lov 04-23-2022 on chart   Depression    HLP (hyperkeratosis lenticularis perstans)    HTN (hypertension)    ICD (implantable cardiac defibrillator)  BSx    [employer]   Ischemic cardiomyopathy    Myocardial infarction (HCC) 1999   Near syncope 09/21/2014   Orthostatic hypotension    PAF (paroxysmal atrial fibrillation) (HCC)    Presence of permanent cardiac pacemaker    Wears glasses    Wears partial dentures    upper    Past Surgical History:  Procedure Laterality Date   CARDIAC CATHETERIZATION N/A 12/11/2015   Procedure: Left Heart Cath and Coronary Angiography;  Surgeon: Rinaldo Cloud, MD;  Location: [facility] INVASIVE CV LAB;  Service: Cardiovascular;  Laterality: N/A;   CARDIAC DEFIBRILLATOR PLACEMENT     [employer]   CARDIAC DEFIBRILLATOR PLACEMENT  04/08/2016   NOT MRI SAFE (DOCUMENT SCANNED IN SYSTEM   COLONOSCOPY  10/06/2007    Mild sigmoid diverticulosis. Small internal hemorrhoids.    CORONARY ARTERY BYPASS GRAFT     ELBOW SURGERY Left    plastic bone replacement   HERNIA REPAIR     x2   LEFT HEART CATH AND CORS/GRAFTS ANGIOGRAPHY N/A 01/07/2018   Procedure: LEFT HEART CATH AND CORS/GRAFTS ANGIOGRAPHY;  Surgeon: Kathleene Hazel, MD;  Location: [facility] INVASIVE CV [facility];  Service: Cardiovascular;  Laterality: N/A;   PERMANENT PACEMAKER GENERATOR CHANGE N/A 11/25/2013   Procedure: PERMANENT PACEMAKER GENERATOR CHANGE;  Surgeon: Duke Salvia, MD;  Location: [facility] CATH LAB;  Service: Cardiovascular;  Laterality: N/A;    ROTATOR CUFF REPAIR Right 2006   SINUS EXPLORATION  1987   ULNAR NERVE TRANSPOSITION Left 07/19/2019   Procedure: Ulnar nerve release at the elbow with anterior transposition and flexor pronator lengthening, anterior interosseous nerve transfer to the deep motor branch of the ulnar nerve at the forearm, flexor digitorum profundus tendon transfer of the ring and small to middle fingers, ulnar nerve release at the wrist, Guyons canal;  Surgeon: Dominica Severin, MD;  Location: [facility] OR;  Service: Orthoped   ULTRASOUND GUIDANCE FOR VASCULAR ACCESS  01/07/2018   Procedure: Ultrasound Guidance For Vascular Access;  Surgeon: Kathleene Hazel, MD;  Location: [facility] INVASIVE CV LAB;  Service: Cardiovascular;;     (Not in an outpatient encounter)    Allergies  Allergen  Reactions   Tramadol Other (See Comments)    Unknown     Social History   Socioeconomic History   Marital status: Married    Spouse name: Not on file   Number of children: 1   Years of education: HS +   Highest education level: Not on file  Occupational History   Occupation: retired  Tobacco Use   Smoking status: Former    Types: Pipe    Quit date: 09/19/1999    Years since quitting: 23.7    Passive exposure: Never   Smokeless tobacco: Never   Tobacco comments:    quit in 1998  Vaping Use   Vaping status: Never Used  Substance  and Sexual Activity   Alcohol use: No    Alcohol/week: 0.0 standard drinks of alcohol    Comment: recovering alcoholic, quit 37yrs ago (2000)   Drug use: No   Sexual activity: Not on file  Other Topics Concern   Not on file  Social History Narrative   Lives at home w/ his wife.   Patient is right handed.   Patient drinks about 3 cups of soda daily.   Social Determinants of Health   Financial Resource Strain: Not on file  Food Insecurity: Not on file  Transportation Needs: Not on file  Physical Activity: Not on file  Stress: Not on file  Social Connections: Not on file  Intimate Partner Violence: Not on file     History reviewed. No pertinent family history.   Review of Systems Pertinent items are noted in HPI.  Objective: Vital signs in last 24 hours:    06/05/2023    2:18 PM 06/02/2023    8:53 AM 05/05/2023    2:00 PM  Vitals with BMI  Height 5\' 10"  5\' 10"  5\' 10"   Weight 156 lbs 153 lbs 13 oz 153 lbs  BMI 22.38 22.07 21.95  Systolic  112 94  Diastolic  62 52  Pulse  68 62      EXAM: General: Well nourished, well developed. Awake, alert and oriented to time, place, person. Normal mood and affect. No apparent distress. Breathing room air.  Operative Lower Extremity: Alignment - Neutral Deformity - None Skin intact except full thickness chronic non healing ulcer over 2nd toe Tenderness to palpation - left 2nd toe 5/5 TA, PT, GS, Per, EHL, FHL Sensation intact to light touch throughout Palpable DP and PT pulses Special testing: None  The contralateral foot/ankle was examined for comparison and noted to be neurovascularly intact with no localized deformity, swelling, or tenderness.  Imaging Review All images taken were independently reviewed by me.  Assessment/Plan: The clinical and radiographic findings were reviewed and discussed at length with the patient.  The patient has left 2nd toe ulcer.  We spoke at length about the natural course of these  findings. We discussed nonoperative and operative treatment options in detail.  The risks and benefits were presented and reviewed. The risks due to infection, stiffness, nerve/vessel/tendon injury, wound healing issues, failure of this surgery, need for further surgery, thromboembolic events, need for further amputation, death among others were discussed. The patient acknowledged the explanation and agreed to proceed with the plan.  [name]  Orthopaedic Surgery [facility]

## 2023-06-16 NOTE — Discharge Instructions (Signed)
[name], MD [facility]  Please read the following information regarding your care after surgery.  Medications  You only need a prescription for the narcotic pain medicine (ex. oxycodone, Percocet, Norco).  All of the other medicines listed below are available over the counter. ? Aleve 2 pills twice a day for the first 3 days after surgery. ? acetominophen (Tylenol) 650 mg every 4-6 hours as you need for minor to moderate pain ? oxycodone as prescribed for severe pain  Weight Bearing ? OK to heel weight on the operated leg or foot.   Cast / Splint / Dressing ? If you have a dressing, do NOT remove this. Keep your splint, cast or dressing clean and dry.  Don't put anything (coat hanger, pencil, etc) down inside of it.  If it gets wet, call the office immediately to schedule an appointment for a cast change.  Swelling IMPORTANT: It is normal for you to have swelling where you had surgery. To reduce swelling and pain, keep at least 3 pillows under your leg so that your toes are above your nose and your heel is above the level of your hip.  It may be necessary to keep your foot or leg elevated for several weeks.  This is critical to helping your incisions heal and your pain to feel better.  Follow Up Call my office at [phone] when you are discharged from the hospital or surgery center to schedule an appointment to be seen 7-10 days after surgery.  Call my office at [phone] if you develop a fever >101.5 F, nausea, vomiting, bleeding from the surgical site or severe pain.

## 2023-06-17 ENCOUNTER — Ambulatory Visit (HOSPITAL_BASED_OUTPATIENT_CLINIC_OR_DEPARTMENT_OTHER): Payer: Medicare PPO | Admitting: Anesthesiology

## 2023-06-17 ENCOUNTER — Encounter (HOSPITAL_BASED_OUTPATIENT_CLINIC_OR_DEPARTMENT_OTHER)
Admission: RE | Disposition: A | Discharge status: Home / Self Care | Payer: Self-pay | Source: Home / Self Care | Attending: Orthopaedic Surgery

## 2023-06-17 ENCOUNTER — Encounter (HOSPITAL_BASED_OUTPATIENT_CLINIC_OR_DEPARTMENT_OTHER): Payer: Self-pay | Admitting: Orthopaedic Surgery

## 2023-06-17 ENCOUNTER — Ambulatory Visit (HOSPITAL_BASED_OUTPATIENT_CLINIC_OR_DEPARTMENT_OTHER): Payer: Medicare PPO

## 2023-06-17 ENCOUNTER — Ambulatory Visit (HOSPITAL_BASED_OUTPATIENT_CLINIC_OR_DEPARTMENT_OTHER)
Admission: RE | Admit: 2023-06-17 | Discharge: 2023-06-17 | Disposition: A | Discharge status: Home / Self Care | Payer: Medicare PPO | Attending: Orthopaedic Surgery | Admitting: Orthopaedic Surgery

## 2023-06-17 ENCOUNTER — Other Ambulatory Visit: Payer: Self-pay

## 2023-06-17 DIAGNOSIS — I252 Old myocardial infarction: Secondary | ICD-10-CM | POA: Diagnosis not present

## 2023-06-17 DIAGNOSIS — L97529 Non-pressure chronic ulcer of other part of left foot with unspecified severity: Secondary | ICD-10-CM | POA: Insufficient documentation

## 2023-06-17 DIAGNOSIS — M2042 Other hammer toe(s) (acquired), left foot: Secondary | ICD-10-CM

## 2023-06-17 DIAGNOSIS — M86172 Other acute osteomyelitis, left ankle and foot: Secondary | ICD-10-CM | POA: Diagnosis not present

## 2023-06-17 DIAGNOSIS — E785 Hyperlipidemia, unspecified: Secondary | ICD-10-CM | POA: Diagnosis not present

## 2023-06-17 DIAGNOSIS — Z01818 Encounter for other preprocedural examination: Secondary | ICD-10-CM

## 2023-06-17 DIAGNOSIS — I130 Hypertensive heart and chronic kidney disease with heart failure and stage 1 through stage 4 chronic kidney disease, or unspecified chronic kidney disease: Secondary | ICD-10-CM | POA: Diagnosis not present

## 2023-06-17 DIAGNOSIS — I2510 Atherosclerotic heart disease of native coronary artery without angina pectoris: Secondary | ICD-10-CM | POA: Insufficient documentation

## 2023-06-17 DIAGNOSIS — I255 Ischemic cardiomyopathy: Secondary | ICD-10-CM | POA: Insufficient documentation

## 2023-06-17 DIAGNOSIS — Z87891 Personal history of nicotine dependence: Secondary | ICD-10-CM | POA: Insufficient documentation

## 2023-06-17 DIAGNOSIS — I5022 Chronic systolic (congestive) heart failure: Secondary | ICD-10-CM

## 2023-06-17 DIAGNOSIS — Z951 Presence of aortocoronary bypass graft: Secondary | ICD-10-CM | POA: Diagnosis not present

## 2023-06-17 DIAGNOSIS — Z95810 Presence of automatic (implantable) cardiac defibrillator: Secondary | ICD-10-CM | POA: Insufficient documentation

## 2023-06-17 DIAGNOSIS — N183 Chronic kidney disease, stage 3 unspecified: Secondary | ICD-10-CM

## 2023-06-17 DIAGNOSIS — N1832 Chronic kidney disease, stage 3b: Secondary | ICD-10-CM | POA: Diagnosis not present

## 2023-06-17 DIAGNOSIS — I480 Paroxysmal atrial fibrillation: Secondary | ICD-10-CM | POA: Diagnosis not present

## 2023-06-17 HISTORY — DX: Unspecified osteoarthritis, unspecified site: M19.90

## 2023-06-17 HISTORY — PX: AMPUTATION TOE: SHX6595

## 2023-06-17 HISTORY — DX: Presence of spectacles and contact lenses: Z97.3

## 2023-06-17 LAB — POCT I-STAT, CHEM 8
BUN: 33 mg/dL — ABNORMAL HIGH (ref 8–23)
Calcium, Ion: 1.16 mmol/L (ref 1.15–1.40)
Chloride: 107 mmol/L (ref 98–111)
Creatinine, Ser: 2.1 mg/dL — ABNORMAL HIGH (ref 0.61–1.24)
Glucose, Bld: 106 mg/dL — ABNORMAL HIGH (ref 70–99)
HCT: 38 % — ABNORMAL LOW (ref 39.0–52.0)
Hemoglobin: 12.9 g/dL — ABNORMAL LOW (ref 13.0–17.0)
Potassium: 4.6 mmol/L (ref 3.5–5.1)
Sodium: 141 mmol/L (ref 135–145)
TCO2: 22 mmol/L (ref 22–32)

## 2023-06-17 SURGERY — AMPUTATION, TOE
Anesthesia: Monitor Anesthesia Care | Site: Toe | Laterality: Left

## 2023-06-17 MED ORDER — VANCOMYCIN HCL 1000 MG IV SOLR
INTRAVENOUS | Status: DC | PRN
  Administered 2023-06-17: 1000 mg via TOPICAL

## 2023-06-17 MED ORDER — ETOMIDATE 2 MG/ML IV SOLN
INTRAVENOUS | Status: AC
  Filled 2023-06-17: qty 10

## 2023-06-17 MED ORDER — PROPOFOL 500 MG/50ML IV EMUL
INTRAVENOUS | Status: DC | PRN
  Administered 2023-06-17: 100 ug/kg/min via INTRAVENOUS

## 2023-06-17 MED ORDER — LIDOCAINE-EPINEPHRINE 2 %-1:100000 IJ SOLN
INTRAMUSCULAR | Status: DC | PRN
  Administered 2023-06-17: 15 mL via PERINEURAL

## 2023-06-17 MED ORDER — PROPOFOL 1000 MG/100ML IV EMUL
INTRAVENOUS | Status: AC
  Filled 2023-06-17: qty 200

## 2023-06-17 MED ORDER — CEFAZOLIN SODIUM-DEXTROSE 2-4 GM/100ML-% IV SOLN
2.0000 g | INTRAVENOUS | Status: AC
  Administered 2023-06-17: 2 g via INTRAVENOUS

## 2023-06-17 MED ORDER — OXYCODONE HCL 5 MG PO TABS: 5.0000 mg | ORAL_TABLET | Freq: Once | ORAL | Status: DC | PRN

## 2023-06-17 MED ORDER — FENTANYL CITRATE (PF) 100 MCG/2ML IJ SOLN
50.0000 ug | Freq: Once | INTRAMUSCULAR | Status: AC
  Administered 2023-06-17: 50 ug via INTRAVENOUS

## 2023-06-17 MED ORDER — CHLORHEXIDINE GLUCONATE 4 % EX SOLN: 60.0000 mL | Freq: Once | CUTANEOUS | Status: DC

## 2023-06-17 MED ORDER — ROPIVACAINE HCL 5 MG/ML IJ SOLN
INTRAMUSCULAR | Status: DC | PRN
  Administered 2023-06-17: 20 mL via PERINEURAL

## 2023-06-17 MED ORDER — CEFAZOLIN SODIUM-DEXTROSE 2-4 GM/100ML-% IV SOLN
INTRAVENOUS | Status: AC
  Filled 2023-06-17: qty 100

## 2023-06-17 MED ORDER — SODIUM CHLORIDE 0.9 % IV SOLN: INTRAVENOUS | Status: DC

## 2023-06-17 MED ORDER — 0.9 % SODIUM CHLORIDE (POUR BTL) OPTIME
TOPICAL | Status: DC | PRN
  Administered 2023-06-17: 1000 mL

## 2023-06-17 MED ORDER — FENTANYL CITRATE (PF) 100 MCG/2ML IJ SOLN: 25.0000 ug | INTRAMUSCULAR | Status: DC | PRN

## 2023-06-17 MED ORDER — ONDANSETRON HCL 4 MG/2ML IJ SOLN: 4.0000 mg | Freq: Once | INTRAMUSCULAR | Status: DC | PRN

## 2023-06-17 MED ORDER — OXYCODONE HCL 5 MG/5ML PO SOLN: 5.0000 mg | Freq: Once | ORAL | Status: DC | PRN

## 2023-06-17 MED ORDER — FENTANYL CITRATE (PF) 100 MCG/2ML IJ SOLN
INTRAMUSCULAR | Status: AC
  Filled 2023-06-17: qty 2

## 2023-06-17 SURGICAL SUPPLY — 56 items
APL PRP STRL LF DISP 70% ISPRP (MISCELLANEOUS) ×1
BANDAGE ESMARK 6X9 LF (GAUZE/BANDAGES/DRESSINGS) IMPLANT
BLADE AVERAGE 25X9 (BLADE) ×1 IMPLANT
BLADE OSC/SAG .038X5.5 CUT EDG (BLADE) ×1 IMPLANT
BLADE SURG 15 STRL LF DISP TIS (BLADE) ×2 IMPLANT
BLADE SURG 15 STRL SS (BLADE) ×2
BNDG CMPR 5X4 KNIT ELC UNQ LF (GAUZE/BANDAGES/DRESSINGS) ×1
BNDG CMPR 6 X 5 YARDS HK CLSR (GAUZE/BANDAGES/DRESSINGS) ×1
BNDG CMPR 75X21 PLY HI ABS (MISCELLANEOUS) ×1
BNDG CMPR 9X6 STRL LF SNTH (GAUZE/BANDAGES/DRESSINGS)
BNDG ELASTIC 4INX 5YD STR LF (GAUZE/BANDAGES/DRESSINGS) ×1 IMPLANT
BNDG ELASTIC 6INX 5YD STR LF (GAUZE/BANDAGES/DRESSINGS) ×1 IMPLANT
BNDG ESMARK 6X9 LF (GAUZE/BANDAGES/DRESSINGS)
BNDG GAUZE DERMACEA FLUFF 4 (GAUZE/BANDAGES/DRESSINGS) ×2 IMPLANT
BNDG GZE DERMACEA 4 6PLY (GAUZE/BANDAGES/DRESSINGS) ×2
CHLORAPREP W/TINT 26 (MISCELLANEOUS) ×1 IMPLANT
COVER BACK TABLE 60X90IN (DRAPES) ×1 IMPLANT
CUFF TOURN SGL QUICK 34 (TOURNIQUET CUFF) ×1
CUFF TRNQT CYL 34X4.125X (TOURNIQUET CUFF) ×1 IMPLANT
DRAPE EXTREMITY T 121X128X90 (DISPOSABLE) ×1 IMPLANT
DRAPE IMP U-DRAPE 54X76 (DRAPES) ×1 IMPLANT
DRAPE OEC MINIVIEW 54X84 (DRAPES) ×1 IMPLANT
DRAPE U-SHAPE 47X51 STRL (DRAPES) ×1 IMPLANT
ELECT REM PT RETURN 9FT ADLT (ELECTROSURGICAL) ×1
ELECTRODE REM PT RTRN 9FT ADLT (ELECTROSURGICAL) ×1 IMPLANT
GAUZE 4X4 16PLY ~~LOC~~+RFID DBL (SPONGE) ×1 IMPLANT
GAUZE SPONGE 4X4 12PLY STRL (GAUZE/BANDAGES/DRESSINGS) ×1 IMPLANT
GAUZE STRETCH 2X75IN STRL (MISCELLANEOUS) ×1 IMPLANT
GAUZE XEROFORM 1X8 LF (GAUZE/BANDAGES/DRESSINGS) ×1 IMPLANT
GLOVE BIO SURGEON STRL SZ7 (GLOVE) IMPLANT
GLOVE BIO SURGEON STRL SZ7.5 (GLOVE) ×1 IMPLANT
GLOVE BIOGEL PI IND STRL 7.5 (GLOVE) ×1 IMPLANT
GLOVE BIOGEL PI IND STRL 8 (GLOVE) ×1 IMPLANT
GOWN STRL REUS W/TWL LRG LVL3 (GOWN DISPOSABLE) ×1 IMPLANT
KIT TURNOVER CYSTO (KITS) ×1 IMPLANT
NDL HYPO 25X1 1.5 SAFETY (NEEDLE) IMPLANT
NDL SAFETY ECLIP 18X1.5 (MISCELLANEOUS) IMPLANT
NEEDLE HYPO 25X1 1.5 SAFETY (NEEDLE) IMPLANT
NS IRRIG 1000ML POUR BTL (IV SOLUTION) IMPLANT
PACK BASIN DAY SURGERY FS (CUSTOM PROCEDURE TRAY) ×1 IMPLANT
PADDING CAST ABS COTTON 4X4 ST (CAST SUPPLIES) ×5 IMPLANT
PENCIL SMOKE EVACUATOR (MISCELLANEOUS) ×1 IMPLANT
SLEEVE SCD COMPRESS KNEE MED (STOCKING) ×1 IMPLANT
SPONGE T-LAP 18X18 ~~LOC~~+RFID (SPONGE) ×1 IMPLANT
STOCKINETTE 6 STRL (DRAPES) ×1 IMPLANT
SUCTION TUBE FRAZIER 10FR DISP (SUCTIONS) ×1 IMPLANT
SUT ETHILON 3 0 PS 1 (SUTURE) ×2 IMPLANT
SUT MNCRL AB 3-0 PS2 18 (SUTURE) ×1 IMPLANT
SUT VIC AB 2-0 SH 27 (SUTURE) ×2
SUT VIC AB 2-0 SH 27XBRD (SUTURE) ×2 IMPLANT
SUT VIC AB 3-0 SH 27 (SUTURE)
SUT VIC AB 3-0 SH 27X BRD (SUTURE) IMPLANT
SYR BULB EAR ULCER 3OZ GRN STR (SYRINGE) ×1 IMPLANT
TOWEL OR 17X24 6PK STRL BLUE (TOWEL DISPOSABLE) ×1 IMPLANT
TUBE CONNECTING 12X1/4 (SUCTIONS) ×1 IMPLANT
UNDERPAD 30X36 HEAVY ABSORB (UNDERPADS AND DIAPERS) ×1 IMPLANT

## 2023-06-17 NOTE — Transfer of Care (Signed)
Immediate Anesthesia Transfer of Care Note  Patient: Connor Wilkerson  Procedure(s) Performed: Procedure(s) (LRB): LEFT 2ND TOE AMPUTATION THRU PROXIMAL PHALANX (Left)  Patient Location: PACU  Anesthesia Type: MAC  Level of Consciousness: awake, alert , oriented and patient cooperative  Airway & Oxygen Therapy: Patient Spontanous Breathing Room Air  Post-op Assessment: Report given to PACU RN and Post -op Vital signs reviewed and stable  Post vital signs: Reviewed and stable  Complications: No apparent anesthesia complications  Last Vitals:  Vitals Value Taken Time  BP 128/62 06/17/23 1032  Temp    Pulse 68 06/17/23 1032  Resp 17 06/17/23 1032  SpO2 96 % 06/17/23 1032  Vitals shown include unfiled device data.  Last Pain:  Vitals:   06/17/23 0930  TempSrc:   PainSc: 0-No pain      Patients Stated Pain Goal: 7 (06/17/23 0844)  Complications: No notable events documented.

## 2023-06-17 NOTE — Anesthesia Procedure Notes (Signed)
Anesthesia Regional Block: Popliteal block   Pre-Anesthetic Checklist: , timeout performed,  Correct Patient, Correct Site, Correct Laterality,  Correct Procedure, Correct Position, site marked,  Risks and benefits discussed,  Surgical consent,  Pre-op evaluation,  At surgeon's request and post-op pain management  Laterality: Left  Prep: chloraprep       Needles:  Injection technique: Single-shot  Needle Type: Echogenic Needle     Needle Length: 9cm      Additional Needles:   Procedures:,,,, ultrasound used (permanent image in chart),,    Narrative:  Start time: 06/17/2023 9:18 AM End time: 06/17/2023 9:27 AM Injection made incrementally with aspirations every 5 mL.  Performed by: Personally  Anesthesiologist: Eilene Ghazi, MD  Additional Notes: Patient tolerated the procedure well without complications

## 2023-06-17 NOTE — Progress Notes (Signed)
Assisted Dr. Okey Dupre with left, popliteal, ultrasound guided block. Side rails up, monitors on throughout procedure. See vital signs in flow sheet. Tolerated Procedure well.

## 2023-06-17 NOTE — Op Note (Addendum)
06/17/2023  1:27 PM   PATIENT: Connor Wilkerson  82 y.o. male  MRN: [id number]   PRE-OPERATIVE DIAGNOSIS:   Acquired second hammer toe of left foot with non healing chronic recurrent ucler   POST-OPERATIVE DIAGNOSIS:   Same   PROCEDURE: LEFT 2ND TOE AMPUTATION THRU PROXIMAL PHALANX   SURGEON:  [name], MD   ASSISTANT: None   ANESTHESIA: General, regional   EBL: Minimal   TOURNIQUET:   12 min (Esmarch wrap)   COMPLICATIONS: None apparent   DISPOSITION: Extubated, awake and stable to recovery.   INDICATION FOR PROCEDURE: The patient presented with above diagnosis.  We discussed the diagnosis, alternative treatment options, risks and benefits of the above surgical intervention, as well as alternative non-operative treatments. All questions/concerns were addressed and the patient/family demonstrated appropriate understanding of the diagnosis, the procedure, the postoperative course, and overall prognosis. The patient wished to proceed with surgical intervention and signed an informed surgical consent as such, in each others presence prior to surgery.   PROCEDURE IN DETAIL: After preoperative consent was obtained and the correct operative site was identified, the patient was brought to the operating room supine on stretcher and transferred onto operating table. General anesthesia was induced. Preoperative antibiotics were administered. Surgical timeout was taken. The patient was then positioned supine with an ipsilateral hip bump. The operative lower extremity was prepped and draped in standard sterile fashion with a tourniquet around the thigh. The extremity was exsanguinated with esmarch wrap around ankle.  Level of amputation was determined by soft tissue status to be the proximal phalanx as confirmed by intraoperative examination. A standard fishmouth incision was made in healthy skin area at this level. Dissection was carried down sharply to the proximal  phalanx. The surrounding tendons and other soft tissues were carefully transected. The 2nd toe was thus excised and sent as specimen for bone biopsy/intraoperative cultures.  We then irrigated the surgical site thoroughly with 3 L of normal saline using cysto tubing. Healthy bleeding noted throughout the amputation stump site. There were no signs of infection remaining in the surgical site following amputation. Hemostasis was carefully obtained. Mild steady bleeding observed and noted to be appropriate for surgical site. Vancomycin powder was placed in the amputation site.    The skin was closed with zero tension using 2-0 prolene suture. The incision was noted to be dry upon closure.   The leg was cleaned with saline and sterile dressings with gauze were applied. A well padded loose wrap was applied. The patient was awakened from anesthesia and transported to the recovery room in stable condition.    FOLLOW UP PLAN: -transfer to PACU, then home -strict heel weightbearing operative extremity, maximum elevation -maintain dressings until follow up -DVT ppx: resume Eliquis POD1 -follow up as outpatient Fri AM as scheduled for wound check -sutures out in 2-3 weeks in outpatient office   RADIOGRAPHS: AP, lateral, oblique radiographs of the left foot were obtained intraoperatively. These showed interval resection of the left 2nd toe thru the proximal phalanx. No other acute injuries are noted.   [name] Orthopaedic Surgery [facility]

## 2023-06-17 NOTE — Anesthesia Procedure Notes (Signed)
Procedure Name: MAC Date/Time: 06/17/2023 9:51 AM  Performed by: Francie Massing, CRNAPre-anesthesia Checklist: Patient identified, Suction available, Emergency Drugs available, Patient being monitored and Timeout performed Oxygen Delivery Method: Simple face mask

## 2023-06-17 NOTE — H&P (Signed)
H&P Update:  -History and Physical Reviewed  -Patient has been re-examined  -No change in the plan of care  -The risks and benefits were presented and reviewed. The risks due to recurrent/new/persistent infection, stiffness, nerve/vessel/tendon injury or rerupture of repaired tendon, nonunion/malunion, allograft usage, wound healing issues, development of arthritis, failure of this surgery, possibility of external fixation with delayed definitive surgery, need for further surgery, thromboembolic events, anesthesia/medical complications, futrher amputation, death among others were discussed. The patient acknowledged the explanation, agreed to proceed with the plan and a consent was signed.  Connor Wilkerson

## 2023-06-17 NOTE — Anesthesia Postprocedure Evaluation (Signed)
Anesthesia Post Note  Patient: SIGMUND MINNIEFIELD  Procedure(s) Performed: LEFT 2ND TOE AMPUTATION THRU PROXIMAL PHALANX (Left: Toe)     Patient location during evaluation: PACU Anesthesia Type: MAC Level of consciousness: awake and alert Pain management: pain level controlled Vital Signs Assessment: post-procedure vital signs reviewed and stable Respiratory status: spontaneous breathing, nonlabored ventilation, respiratory function stable and patient connected to nasal cannula oxygen Cardiovascular status: stable and blood pressure returned to baseline Postop Assessment: no apparent nausea or vomiting Anesthetic complications: no  No notable events documented.  Last Vitals:  Vitals:   06/17/23 1100 06/17/23 1129  BP: 107/66 119/88  Pulse: 64 62  Resp: 16 14  Temp:  (!) 36.3 C  SpO2: 97% 100%    Last Pain:  Vitals:   06/17/23 1129  TempSrc:   PainSc: 0-No pain                 , S

## 2023-06-17 NOTE — Anesthesia Preprocedure Evaluation (Signed)
Anesthesia Evaluation  Patient identified by MRN, date of birth, ID band Patient awake    Reviewed: Allergy & Precautions, H&P , NPO status , Patient's Chart, lab work & pertinent test results  Airway Mallampati: II  TM Distance: >3 FB Neck ROM: Full    Dental no notable dental hx.    Pulmonary neg pulmonary ROS, former smoker   Pulmonary exam normal breath sounds clear to auscultation       Cardiovascular hypertension, + CAD, + Past MI, + CABG and +CHF  Normal cardiovascular exam+ pacemaker + Cardiac Defibrillator  Rhythm:Regular Rate:Normal     Neuro/Psych negative neurological ROS  negative psych ROS   GI/Hepatic negative GI ROS, Neg liver ROS,,,  Endo/Other  negative endocrine ROS    Renal/GU Renal InsufficiencyRenal disease  negative genitourinary   Musculoskeletal negative musculoskeletal ROS (+)    Abdominal   Peds negative pediatric ROS (+)  Hematology negative hematology ROS (+)   Anesthesia Other Findings   Reproductive/Obstetrics negative OB ROS                             Anesthesia Physical Anesthesia Plan  ASA: 3  Anesthesia Plan: MAC   Post-op Pain Management: Regional block*   Induction: Intravenous  PONV Risk Score and Plan: 1 and Propofol infusion and Treatment may vary due to age or medical condition  Airway Management Planned: Simple Face Mask  Additional Equipment:   Intra-op Plan:   Post-operative Plan:   Informed Consent: I have reviewed the patients History and Physical, chart, labs and discussed the procedure including the risks, benefits and alternatives for the proposed anesthesia with the patient or authorized representative who has indicated his/her understanding and acceptance.     Dental advisory given  Plan Discussed with: CRNA and Surgeon  Anesthesia Plan Comments:        Anesthesia Quick Evaluation

## 2023-06-17 NOTE — Anesthesia Procedure Notes (Signed)
Anesthesia Procedure Image    

## 2023-06-18 ENCOUNTER — Encounter (HOSPITAL_BASED_OUTPATIENT_CLINIC_OR_DEPARTMENT_OTHER): Payer: Self-pay | Admitting: Orthopaedic Surgery

## 2023-06-19 DIAGNOSIS — Z5189 Encounter for other specified aftercare: Secondary | ICD-10-CM | POA: Diagnosis not present

## 2023-06-19 LAB — SURGICAL PATHOLOGY

## 2023-06-22 LAB — AEROBIC/ANAEROBIC CULTURE W GRAM STAIN (SURGICAL/DEEP WOUND)
Culture: NO GROWTH
Gram Stain: NONE SEEN

## 2023-06-23 ENCOUNTER — Other Ambulatory Visit: Payer: Self-pay | Admitting: Internal Medicine

## 2023-06-23 DIAGNOSIS — N1832 Chronic kidney disease, stage 3b: Secondary | ICD-10-CM | POA: Diagnosis not present

## 2023-06-24 DIAGNOSIS — M25811 Other specified joint disorders, right shoulder: Secondary | ICD-10-CM | POA: Diagnosis not present

## 2023-06-24 MED ORDER — ENTRESTO 24-26 MG PO TABS: ORAL_TABLET | ORAL | 3 refills | Status: DC

## 2023-06-25 ENCOUNTER — Telehealth: Payer: Self-pay

## 2023-06-25 NOTE — Telephone Encounter (Signed)
   Pre-operative Risk Assessment    Patient Name: Connor Wilkerson  DOB: 05-16-1941 MRN: [id number]      Request for Surgical Clearance     Procedure:   Right Reverse Total Arthroplasty  Date of Surgery:  Clearance TBD                                 Surgeon:  Dr.Steven Ranell Patrick Surgeon's Group or Practice Name:  [facility] Phone number:  [phone] Fax number:  [phone]   Type of Clearance Requested:   - Medical  - Pharmacy:  Hold Apixaban (Eliquis)     Type of Anesthesia:  General    Additional requests/questions:   N/A  Berneda Rose   06/25/2023, 4:41 PM

## 2023-06-26 ENCOUNTER — Encounter: Payer: Self-pay | Admitting: Internal Medicine

## 2023-06-26 NOTE — Telephone Encounter (Signed)
I lvm with surgery coordinator to clarify which joint pt is having procedure on.

## 2023-06-26 NOTE — Telephone Encounter (Signed)
Patient with diagnosis of afib on Eliquis for anticoagulation.    Procedure: right reverse total arthroplasty, clarifying joint (likely either hip or knee) Date of procedure: TBD  CHA2DS2-VASc Score = 5  This indicates a 7.2% annual risk of stroke. The patient's score is based upon: CHF History: 1 HTN History: 1 Diabetes History: 0 Stroke History: 0 Vascular Disease History: 1 Age Score: 2 Gender Score: 0   CrCl 20mL/min Platelet count 177K  Per office protocol, patient can hold Eliquis for 3-4 days prior to procedure.    **This guidance is not considered finalized until pre-operative APP has relayed final recommendations.**

## 2023-06-26 NOTE — Progress Notes (Signed)
PERIOPERATIVE PRESCRIPTION FOR IMPLANTED CARDIAC DEVICE PROGRAMMING   Patient Information:  Patient: Connor Wilkerson  MRN: [id number]  Date of Birth: [birth year]      Planned Procedure:  Right Shoulder Reverse Total Arthroplasty   Surgeon:    Salvadore Farber  Date of Procedure:  TBD  Surgeon's Group or Practice Name:  [facility] Phone number:  [phone] Fax number:  [phone]   Device Information:   Clinic EP Physician:   Dr. Sherryl Manges Device Type:  Defibrillator Manufacturer and Phone #:  [employer]: [phone] Pacemaker Dependent?:  No Date of Last Device Check:  06/18/2023        Normal Device Function?:  Yes     Electrophysiologist's Recommendations:   Have magnet available. Provide continuous ECG monitoring when magnet is used or reprogramming is to be performed.  Procedure will likely interfere with device function.  Device should be programmed:  Tachy therapies disabled  Per Device Clinic Standing Jarrett Ables, Lenor Coffin  06/26/2023 10:39 AM

## 2023-06-26 NOTE — Telephone Encounter (Addendum)
   Patient Name: Connor Wilkerson  DOB: [birth year] MRN: [id number]  Primary Cardiologist: Sherryl Manges, MD  Chart reviewed as part of pre-operative protocol coverage. Given past medical history and time since last visit, based on ACC/AHA guidelines, Connor Wilkerson is at acceptable risk for the planned procedure without further cardiovascular testing.  Patient recently seen in follow-up on 06/02/2023 and was able to complete greater than 4 METS of activity without any difficulty.  Patient's RCRI score is 11% for upcoming procedure.  Connor Wilkerson was contacted today and continues to do well since his previous follow-up on 06/02/2023.  He is also scheduled for a visit with Dr. Gery Pray on 08/11/2023.  He was advised to contact office if any changes occur to his health between now and his follow-up in October.  Patient can hold Eliquis 3 to 4 days prior to procedure and should restart postprocedure when surgically safe.  Patient also has an implanted ICD and note will be forwarded to device clinic for guidance on programming during scheduled procedure.    I will route this recommendation to the requesting party via Epic fax function and remove from pre-op pool.  Please call with questions.  Napoleon Form, Leodis Rains, NP 06/26/2023, 10:26 AM

## 2023-06-26 NOTE — Telephone Encounter (Signed)
The procedure is on the right shoulder.

## 2023-07-03 ENCOUNTER — Ambulatory Visit (INDEPENDENT_AMBULATORY_CARE_PROVIDER_SITE_OTHER): Payer: Medicare PPO

## 2023-07-03 DIAGNOSIS — I255 Ischemic cardiomyopathy: Secondary | ICD-10-CM

## 2023-07-03 LAB — CUP PACEART REMOTE DEVICE CHECK
Battery Remaining Longevity: 24 mo
Battery Remaining Percentage: 28 %
Brady Statistic RA Percent Paced: 68 %
Brady Statistic RV Percent Paced: 0 %
Date Time Interrogation Session: 20240830004100
HighPow Impedance: 45 Ohm
Implantable Lead Connection Status: 753985
Implantable Lead Connection Status: 753985
Implantable Lead Implant Date: 20060512
Implantable Lead Implant Date: 20060512
Implantable Lead Location: 753859
Implantable Lead Location: 753860
Implantable Lead Model: 158
Implantable Lead Model: 5076
Implantable Lead Serial Number: 159477
Implantable Pulse Generator Implant Date: 20150123
Lead Channel Impedance Value: 383 Ohm
Lead Channel Impedance Value: 620 Ohm
Lead Channel Pacing Threshold Amplitude: 0.8 V
Lead Channel Pacing Threshold Amplitude: 0.9 V
Lead Channel Pacing Threshold Pulse Width: 0.4 ms
Lead Channel Pacing Threshold Pulse Width: 0.4 ms
Lead Channel Setting Pacing Amplitude: 2 V
Lead Channel Setting Pacing Amplitude: 2.4 V
Lead Channel Setting Pacing Pulse Width: 0.4 ms
Lead Channel Setting Sensing Sensitivity: 0.6 mV
Pulse Gen Serial Number: 112776

## 2023-07-07 NOTE — Progress Notes (Signed)
Remote ICD transmission.   

## 2023-07-09 DIAGNOSIS — Z4889 Encounter for other specified surgical aftercare: Secondary | ICD-10-CM | POA: Diagnosis not present

## 2023-07-14 DIAGNOSIS — Z9861 Coronary angioplasty status: Secondary | ICD-10-CM | POA: Diagnosis not present

## 2023-07-14 DIAGNOSIS — I252 Old myocardial infarction: Secondary | ICD-10-CM | POA: Diagnosis not present

## 2023-07-14 DIAGNOSIS — Z01818 Encounter for other preprocedural examination: Secondary | ICD-10-CM | POA: Diagnosis not present

## 2023-07-14 DIAGNOSIS — N1832 Chronic kidney disease, stage 3b: Secondary | ICD-10-CM | POA: Diagnosis not present

## 2023-07-14 DIAGNOSIS — Z95810 Presence of automatic (implantable) cardiac defibrillator: Secondary | ICD-10-CM | POA: Diagnosis not present

## 2023-07-14 DIAGNOSIS — I480 Paroxysmal atrial fibrillation: Secondary | ICD-10-CM | POA: Diagnosis not present

## 2023-07-14 DIAGNOSIS — I255 Ischemic cardiomyopathy: Secondary | ICD-10-CM | POA: Diagnosis not present

## 2023-07-14 DIAGNOSIS — I2510 Atherosclerotic heart disease of native coronary artery without angina pectoris: Secondary | ICD-10-CM | POA: Diagnosis not present

## 2023-07-14 DIAGNOSIS — M12811 Other specific arthropathies, not elsewhere classified, right shoulder: Secondary | ICD-10-CM | POA: Diagnosis not present

## 2023-07-20 ENCOUNTER — Other Ambulatory Visit: Payer: Self-pay | Admitting: *Deleted

## 2023-07-20 DIAGNOSIS — I6523 Occlusion and stenosis of bilateral carotid arteries: Secondary | ICD-10-CM

## 2023-07-20 DIAGNOSIS — I739 Peripheral vascular disease, unspecified: Secondary | ICD-10-CM

## 2023-07-27 ENCOUNTER — Ambulatory Visit: Payer: Medicare PPO

## 2023-07-27 ENCOUNTER — Ambulatory Visit (HOSPITAL_COMMUNITY): Payer: Medicare PPO

## 2023-07-30 DIAGNOSIS — L821 Other seborrheic keratosis: Secondary | ICD-10-CM | POA: Diagnosis not present

## 2023-07-30 DIAGNOSIS — Z4889 Encounter for other specified surgical aftercare: Secondary | ICD-10-CM | POA: Diagnosis not present

## 2023-07-30 DIAGNOSIS — L578 Other skin changes due to chronic exposure to nonionizing radiation: Secondary | ICD-10-CM | POA: Diagnosis not present

## 2023-07-30 DIAGNOSIS — L820 Inflamed seborrheic keratosis: Secondary | ICD-10-CM | POA: Diagnosis not present

## 2023-08-11 ENCOUNTER — Ambulatory Visit: Payer: Medicare PPO | Attending: Cardiovascular Disease | Admitting: Cardiovascular Disease

## 2023-08-11 ENCOUNTER — Encounter: Payer: Self-pay | Admitting: Cardiovascular Disease

## 2023-08-11 VITALS — BP 126/78 | HR 66 | Ht 70.0 in | Wt 152.6 lb

## 2023-08-11 DIAGNOSIS — I5022 Chronic systolic (congestive) heart failure: Secondary | ICD-10-CM | POA: Diagnosis not present

## 2023-08-11 DIAGNOSIS — I10 Essential (primary) hypertension: Secondary | ICD-10-CM

## 2023-08-11 DIAGNOSIS — I6523 Occlusion and stenosis of bilateral carotid arteries: Secondary | ICD-10-CM

## 2023-08-11 DIAGNOSIS — I739 Peripheral vascular disease, unspecified: Secondary | ICD-10-CM | POA: Diagnosis not present

## 2023-08-11 DIAGNOSIS — Z95810 Presence of automatic (implantable) cardiac defibrillator: Secondary | ICD-10-CM

## 2023-08-11 DIAGNOSIS — I2510 Atherosclerotic heart disease of native coronary artery without angina pectoris: Secondary | ICD-10-CM

## 2023-08-11 NOTE — Assessment & Plan Note (Signed)
History of systolic heart failure in the past with recent echo performed 04/14/2022 revealing EF of 55 to 60%.  He is otherwise asymptomatic from this on Entresto.

## 2023-08-11 NOTE — Assessment & Plan Note (Signed)
History of moderate bilateral ICA stenosis by duplex ultrasound performed 07/01/2022.  This will be repeated on an annual basis.

## 2023-08-11 NOTE — Patient Instructions (Addendum)
Medication Instructions:  Your physician recommends that you continue on your current medications as directed. Please refer to the Current Medication list given to you today.  *If you need a refill on your cardiac medications before your next appointment, please call your pharmacy*   Lab Work: None  Testing/Procedures: Keep appointment for Carotid Dopplers for 08/31/2023. Your physician has requested that you have a carotid duplex. This test is an ultrasound of the carotid arteries in your neck. It looks at blood flow through these arteries that supply the brain with blood. Allow one hour for this exam. There are no restrictions or special instructions.    Follow-Up: At [facility], you and your health needs are our priority.  As part of our continuing mission to provide you with exceptional heart care, we have created designated Provider Care Teams.  These Care Teams include your primary Cardiologist (physician) and Advanced Practice Providers (APPs -  Physician Assistants and Nurse Practitioners) who all work together to provide you with the care you need, when you need it.  Your next appointment:   12 month(s)  Provider:   Nanetta Batty, MD     Other Instructions You have Cardiac clearance with low risk for shoulder surgery.

## 2023-08-11 NOTE — Assessment & Plan Note (Signed)
History of CAD status post CABG in 1999.  He had cardiac catheterization by Dr. Sharyn Lull 12/11/2015 and again by Dr. Clifton James 01/07/2018 revealing patent grafts.  He is asymptomatic.

## 2023-08-11 NOTE — Assessment & Plan Note (Signed)
ICD implanted in 2006 followed annually by Dr. Graciela Husbands

## 2023-08-11 NOTE — Assessment & Plan Note (Signed)
History of essential hypertension blood pressure measured today at 126/78.  He is on low-dose amlodipine, hydralazine and Entresto.

## 2023-08-11 NOTE — Assessment & Plan Note (Signed)
History of hyperlipidemia on high-dose statin therapy with lipid profile performed 06/08/2023 revealing a total cholesterol 114, LDL 47 and HDL 48.

## 2023-08-11 NOTE — Progress Notes (Unsigned)
08/11/2023 Connor Wilkerson   11-Dec-1940  [id number]  Primary Physician Doran Stabler, NP Primary Cardiologist: Runell Gess MD Nicholes Calamity, [place]  HPI:  Connor Wilkerson is a 82 y.o.  thin-appearing married Caucasian male father of 1, grandfather 2 grandchildren accompanied by his wife [name] today. He was referred for preoperative clearance before right shoulder replacement by Dr. Devonne Doughty. He is followed by Dr. Graciela Husbands for his ICD.  I last saw him in the office 10//23.  He has a history of ischemic cardiomyopathy in the past. He had CABG in 1999. Other problems include treated hypertension and hyperlipidemia. He smoked remotely having quit 15 to 16 years ago and quit drinking at the same time. He had a cardiac catheterization performed by Dr. Sharyn Lull 12/11/2015 revealing patent grafts and again by Dr. Clifton James 01/07/2018 again revealing patent grafts. Recent 2D echo performed 04/13/2022 revealed normal LV systolic function.   Since I saw him a year ago he did undergo a toe amputation recently.  He still needs his right shoulder operated on and I believe he can have this done at low risk.   Current Meds  Medication Sig   amLODipine (NORVASC) 2.5 MG tablet Take 2 tablets (5 mg total) by mouth daily. (Patient taking differently: Take 2.5 mg by mouth daily. Taking 1 tab daily)   buPROPion (WELLBUTRIN SR) 150 MG 12 hr tablet Take 300 mg by mouth daily.   ELIQUIS 2.5 MG TABS tablet TAKE 1 TABLET BY MOUTH TWICE A DAY   escitalopram (LEXAPRO) 10 MG tablet Take 10 mg by mouth daily.   famotidine (PEPCID) 20 MG tablet Take 20 mg by mouth 2 (two) times daily.   gabapentin (NEURONTIN) 300 MG capsule Take 900 mg by mouth at bedtime.   hydrALAZINE (APRESOLINE) 25 MG tablet Take 1 tablet twice daily as needed for systolic blood pressure (top number) greater than 160.   nitroGLYCERIN (NITROSTAT) 0.4 MG SL tablet Place 1 tablet (0.4 mg total) under the tongue every 5 (five) minutes as needed for  chest pain.   oxyCODONE-acetaminophen (PERCOCET) 10-325 MG tablet Take 1 tablet by mouth 4 (four) times daily as needed for pain.   rosuvastatin (CRESTOR) 40 MG tablet TAKE 1 TABLET BY MOUTH EVERY DAY   sacubitril-valsartan (ENTRESTO) 24-26 MG Take 1/2 tablet by mouth 2 times daily.   traZODone (DESYREL) 50 MG tablet Take 50 mg by mouth at bedtime.     Allergies  Allergen Reactions   Tramadol Other (See Comments)    Unknown     Social History   Socioeconomic History   Marital status: Married    Spouse name: Not on file   Number of children: 1   Years of education: HS +   Highest education level: Not on file  Occupational History   Occupation: retired  Tobacco Use   Smoking status: Former    Types: Pipe    Quit date: 09/19/1999    Years since quitting: 23.9    Passive exposure: Never   Smokeless tobacco: Never   Tobacco comments:    quit in 1998  Vaping Use   Vaping status: Never Used  Substance and Sexual Activity   Alcohol use: No    Alcohol/week: 0.0 standard drinks of alcohol    Comment: recovering alcoholic, quit 87yrs ago (2000)   Drug use: No   Sexual activity: Not on file  Other Topics Concern   Not on file  Social History Narrative   Lives  at home w/ his wife.   Patient is right handed.   Patient drinks about 3 cups of soda daily.   Social Determinants of Health   Financial Resource Strain: Not on file  Food Insecurity: Not on file  Transportation Needs: Not on file  Physical Activity: Not on file  Stress: Not on file  Social Connections: Not on file  Intimate Partner Violence: Not on file     Review of Systems: General: negative for chills, fever, night sweats or weight changes.  Cardiovascular: negative for chest pain, dyspnea on exertion, edema, orthopnea, palpitations, paroxysmal nocturnal dyspnea or shortness of breath Dermatological: negative for rash Respiratory: negative for cough or wheezing Urologic: negative for hematuria Abdominal:  negative for nausea, vomiting, diarrhea, bright red blood per rectum, melena, or hematemesis Neurologic: negative for visual changes, syncope, or dizziness All other systems reviewed and are otherwise negative except as noted above.    Blood pressure 126/78, pulse 66, height 5\' 10"  (1.778 m), weight 152 lb 9.6 oz (69.2 kg), SpO2 95%.  General appearance: alert and no distress Neck: no adenopathy, no carotid bruit, no JVD, supple, symmetrical, trachea midline, and thyroid not enlarged, symmetric, no tenderness/mass/nodules Lungs: clear to auscultation bilaterally Heart: regular rate and rhythm, S1, S2 normal, no murmur, click, rub or gallop Extremities: extremities normal, atraumatic, no cyanosis or edema Pulses: 2+ and symmetric Skin: Skin color, texture, turgor normal. No rashes or lesions Neurologic: Grossly normal  EKG not performed today      ASSESSMENT AND PLAN:   Chronic systolic CHF (congestive heart failure) (HCC) History of systolic heart failure in the past with recent echo performed 04/14/2022 revealing EF of 55 to 60%.  He is otherwise asymptomatic from this on Entresto.  Implantable cardioverter-defibrillator (ICD) in situ ICD implanted in 2006 followed annually by Dr. Graciela Husbands  Essential hypertension History of essential hypertension blood pressure measured today at 126/78.  He is on low-dose amlodipine, hydralazine and Entresto.  CAD (coronary artery disease) History of CAD status post CABG in 1999.  He had cardiac catheterization by Dr. Sharyn Lull 12/11/2015 and again by Dr. Clifton James 01/07/2018 revealing patent grafts.  He is asymptomatic.  Hyperlipidemia History of hyperlipidemia on high-dose statin therapy with lipid profile performed 06/08/2023 revealing a total cholesterol 114, LDL 47 and HDL 48.  Paroxysmal atrial fibrillation (HCC) History of PAF maintaining sinus rhythm on Eliquis oral anticoagulation.  Carotid artery disease (HCC) History of moderate bilateral ICA  stenosis by duplex ultrasound performed 07/01/2022.  This will be repeated on an annual basis.     Runell Gess MD FACP,FACC,FAHA, [facility] 08/11/2023 1:58 PM

## 2023-08-11 NOTE — Assessment & Plan Note (Signed)
History of PAF maintaining sinus rhythm on Eliquis oral anticoagulation. 

## 2023-08-31 ENCOUNTER — Ambulatory Visit: Payer: Medicare PPO

## 2023-08-31 ENCOUNTER — Ambulatory Visit (HOSPITAL_COMMUNITY): Payer: Medicare PPO

## 2023-08-31 ENCOUNTER — Ambulatory Visit (HOSPITAL_COMMUNITY): Payer: Medicare PPO | Attending: Surgery

## 2023-09-09 DIAGNOSIS — Z23 Encounter for immunization: Secondary | ICD-10-CM | POA: Diagnosis not present

## 2023-09-09 DIAGNOSIS — I255 Ischemic cardiomyopathy: Secondary | ICD-10-CM | POA: Diagnosis not present

## 2023-09-09 DIAGNOSIS — Z95810 Presence of automatic (implantable) cardiac defibrillator: Secondary | ICD-10-CM | POA: Diagnosis not present

## 2023-09-09 DIAGNOSIS — N1832 Chronic kidney disease, stage 3b: Secondary | ICD-10-CM | POA: Diagnosis not present

## 2023-09-09 DIAGNOSIS — Z9861 Coronary angioplasty status: Secondary | ICD-10-CM | POA: Diagnosis not present

## 2023-09-09 DIAGNOSIS — I480 Paroxysmal atrial fibrillation: Secondary | ICD-10-CM | POA: Diagnosis not present

## 2023-09-09 DIAGNOSIS — I252 Old myocardial infarction: Secondary | ICD-10-CM | POA: Diagnosis not present

## 2023-09-09 DIAGNOSIS — I10 Essential (primary) hypertension: Secondary | ICD-10-CM | POA: Diagnosis not present

## 2023-09-09 DIAGNOSIS — I2510 Atherosclerotic heart disease of native coronary artery without angina pectoris: Secondary | ICD-10-CM | POA: Diagnosis not present

## 2023-09-10 ENCOUNTER — Emergency Department (HOSPITAL_COMMUNITY): Payer: Medicare PPO

## 2023-09-10 ENCOUNTER — Emergency Department (HOSPITAL_COMMUNITY)
Admission: EM | Admit: 2023-09-10 | Discharge: 2023-09-11 | Disposition: A | Discharge status: Home / Self Care | Payer: Medicare PPO | Attending: Emergency Medicine | Admitting: Emergency Medicine

## 2023-09-10 ENCOUNTER — Other Ambulatory Visit: Payer: Self-pay

## 2023-09-10 DIAGNOSIS — S0990XA Unspecified injury of head, initial encounter: Secondary | ICD-10-CM | POA: Diagnosis not present

## 2023-09-10 DIAGNOSIS — S069X0A Unspecified intracranial injury without loss of consciousness, initial encounter: Secondary | ICD-10-CM | POA: Insufficient documentation

## 2023-09-10 DIAGNOSIS — W19XXXA Unspecified fall, initial encounter: Secondary | ICD-10-CM | POA: Diagnosis not present

## 2023-09-10 DIAGNOSIS — S199XXA Unspecified injury of neck, initial encounter: Secondary | ICD-10-CM | POA: Diagnosis not present

## 2023-09-10 DIAGNOSIS — Z23 Encounter for immunization: Secondary | ICD-10-CM | POA: Insufficient documentation

## 2023-09-10 DIAGNOSIS — S0003XA Contusion of scalp, initial encounter: Secondary | ICD-10-CM

## 2023-09-10 DIAGNOSIS — Z79899 Other long term (current) drug therapy: Secondary | ICD-10-CM | POA: Diagnosis not present

## 2023-09-10 DIAGNOSIS — N1832 Chronic kidney disease, stage 3b: Secondary | ICD-10-CM

## 2023-09-10 DIAGNOSIS — R4182 Altered mental status, unspecified: Secondary | ICD-10-CM | POA: Insufficient documentation

## 2023-09-10 DIAGNOSIS — S069X9A Unspecified intracranial injury with loss of consciousness of unspecified duration, initial encounter: Secondary | ICD-10-CM

## 2023-09-10 DIAGNOSIS — W01198A Fall on same level from slipping, tripping and stumbling with subsequent striking against other object, initial encounter: Secondary | ICD-10-CM | POA: Insufficient documentation

## 2023-09-10 DIAGNOSIS — Z7901 Long term (current) use of anticoagulants: Secondary | ICD-10-CM | POA: Diagnosis not present

## 2023-09-10 DIAGNOSIS — I959 Hypotension, unspecified: Secondary | ICD-10-CM | POA: Diagnosis not present

## 2023-09-10 DIAGNOSIS — G319 Degenerative disease of nervous system, unspecified: Secondary | ICD-10-CM | POA: Diagnosis not present

## 2023-09-10 DIAGNOSIS — I672 Cerebral atherosclerosis: Secondary | ICD-10-CM | POA: Diagnosis not present

## 2023-09-10 DIAGNOSIS — Y92019 Unspecified place in single-family (private) house as the place of occurrence of the external cause: Secondary | ICD-10-CM | POA: Insufficient documentation

## 2023-09-10 DIAGNOSIS — I129 Hypertensive chronic kidney disease with stage 1 through stage 4 chronic kidney disease, or unspecified chronic kidney disease: Secondary | ICD-10-CM | POA: Diagnosis not present

## 2023-09-10 DIAGNOSIS — S60811A Abrasion of right wrist, initial encounter: Secondary | ICD-10-CM | POA: Diagnosis not present

## 2023-09-10 DIAGNOSIS — N189 Chronic kidney disease, unspecified: Secondary | ICD-10-CM | POA: Diagnosis not present

## 2023-09-10 DIAGNOSIS — R001 Bradycardia, unspecified: Secondary | ICD-10-CM | POA: Diagnosis not present

## 2023-09-10 LAB — BASIC METABOLIC PANEL
Anion gap: 9 (ref 5–15)
BUN: 36 mg/dL — ABNORMAL HIGH (ref 8–23)
CO2: 23 mmol/L (ref 22–32)
Calcium: 9.1 mg/dL (ref 8.9–10.3)
Chloride: 105 mmol/L (ref 98–111)
Creatinine, Ser: 2.64 mg/dL — ABNORMAL HIGH (ref 0.61–1.24)
GFR, Estimated: 23 mL/min — ABNORMAL LOW (ref >60)
Glucose, Bld: 102 mg/dL — ABNORMAL HIGH (ref 70–99)
Potassium: 4.6 mmol/L (ref 3.5–5.1)
Sodium: 137 mmol/L (ref 135–145)

## 2023-09-10 LAB — URINALYSIS, ROUTINE W REFLEX MICROSCOPIC
Bilirubin Urine: NEGATIVE
Glucose, UA: NEGATIVE mg/dL
Hgb urine dipstick: NEGATIVE
Ketones, ur: NEGATIVE mg/dL
Leukocytes,Ua: NEGATIVE
Nitrite: NEGATIVE
Protein, ur: 100 mg/dL — AB
Specific Gravity, Urine: 1.015 (ref 1.005–1.030)
pH: 6 (ref 5.0–8.0)

## 2023-09-10 LAB — CBC
HCT: 31.6 % — ABNORMAL LOW (ref 39.0–52.0)
Hemoglobin: 10.2 g/dL — ABNORMAL LOW (ref 13.0–17.0)
MCH: 32.6 pg (ref 26.0–34.0)
MCHC: 32.3 g/dL (ref 30.0–36.0)
MCV: 101 fL — ABNORMAL HIGH (ref 80.0–100.0)
Platelets: 171 10*3/uL (ref 150–400)
RBC: 3.13 MIL/uL — ABNORMAL LOW (ref 4.22–5.81)
RDW: 12.6 % (ref 11.5–15.5)
WBC: 11 10*3/uL — ABNORMAL HIGH (ref 4.0–10.5)
nRBC: 0 % (ref 0.0–0.2)

## 2023-09-10 MED ORDER — TETANUS-DIPHTH-ACELL PERTUSSIS 5-2.5-18.5 LF-MCG/0.5 IM SUSY
0.5000 mL | PREFILLED_SYRINGE | Freq: Once | INTRAMUSCULAR | Status: AC
  Administered 2023-09-10: 0.5 mL via INTRAMUSCULAR
  Filled 2023-09-10: qty 0.5

## 2023-09-10 NOTE — ED Notes (Signed)
Awaiting call back from [facility] [employer] for interrogation of pacemaker.

## 2023-09-10 NOTE — Discharge Instructions (Addendum)
It was our pleasure to provide your ER care today - we hope that you feel better.  Drink plenty of fluids/stay well hydrated. Fall precautions - use great care/caution, and walker when up and about to help decrease risk of falling.   Take acetaminophen as need.   Your lab tests shows that you have chronic kidney disease that is a bit worse than previous. Make sure to avoid nsaid-type medication such as ibuprofen/motrin or naprosyn/aleve, and see attached information re: chronic kidney disease prevention, and follow up closely with your doctor this coming week.   Return to ER if worse, new symptoms, new/severe pain, weak/fainting, chest pain, trouble breathing, or other emergency concern.

## 2023-09-10 NOTE — ED Notes (Signed)
Attempted to call wife to update and was unsuccessful.

## 2023-09-10 NOTE — ED Provider Notes (Addendum)
[facility] AT [facility] Provider Note   CSN: [id number] Arrival date & time: 09/10/23  1645     History  Chief Complaint  Patient presents with   Altered Mental Status    2 to 3 day of confusion. Fall today unwitnessed, pt doesn't remember the fall. Only trauma is pts right wrist with an abrasion. On Eiiquis. No stroke hx per ems. Bg 129 pta. VSS pta. Pt A & O x 3 upon arrival.     Connor Wilkerson is a 82 y.o. male.  Pt with c/o fall at home, indicating lost balance, fell back, hit head, momentary loc. Is on eliquis. Indicates was asymptomatic prior to fall. Denies dizziness or faintness. No chest pain or discomfort. No sob or unusual doe. No palpitations. Noted in chart of recent confusion. Pt denies any change in speech or vision. No numbness/weakness. Pt denies neck/back pain. Denies extremity pain or injury. No fever or chills. No gu c/o.   The history is provided by the patient and medical records.  Altered Mental Status Associated symptoms: no abdominal pain, no fever, no rash, no vomiting and no weakness        Home Medications Prior to Admission medications   Medication Sig Start Date End Date Taking? Authorizing Provider  amLODipine (NORVASC) 2.5 MG tablet Take 2 tablets (5 mg total) by mouth daily. Patient taking differently: Take 2.5 mg by mouth daily. Taking 1 tab daily 02/09/23   Duke Salvia, MD  buPROPion [facility] SR) 150 MG 12 hr tablet Take 300 mg by mouth daily. 05/21/23   [provider]  cyclobenzaprine (FLEXERIL) 5 MG tablet Take 5 mg by mouth 3 (three) times daily as needed for muscle spasms. Patient not taking: Reported on 08/11/2023    [provider]  ELIQUIS 2.5 MG TABS tablet TAKE 1 TABLET BY MOUTH TWICE A DAY 05/25/23   Duke Salvia, MD  escitalopram (LEXAPRO) 10 MG tablet Take 10 mg by mouth daily.    [provider]  famotidine (PEPCID) 20 MG tablet Take 20 mg by mouth 2 (two) times  daily.    [provider]  gabapentin (NEURONTIN) 300 MG capsule Take 900 mg by mouth at bedtime.    [provider]  hydrALAZINE (APRESOLINE) 25 MG tablet Take 1 tablet twice daily as needed for systolic blood pressure (top number) greater than 160. 05/05/23   Monge, Petra Kuba, NP  nitroGLYCERIN (NITROSTAT) 0.4 MG SL tablet Place 1 tablet (0.4 mg total) under the tongue every 5 (five) minutes as needed for chest pain. 12/11/22   Duke Salvia, MD  oxyCODONE-acetaminophen (PERCOCET) 10-325 MG tablet Take 1 tablet by mouth 4 (four) times daily as needed for pain. 09/18/20   [provider]  rosuvastatin (CRESTOR) 40 MG tablet TAKE 1 TABLET BY MOUTH EVERY DAY 02/23/23   Duke Salvia, MD  sacubitril-valsartan (ENTRESTO) 24-26 MG Take 1/2 tablet by mouth 2 times daily. 06/24/23   Duke Salvia, MD  traZODone (DESYREL) 50 MG tablet Take 50 mg by mouth at bedtime. 12/21/19   [provider]      Allergies    Tramadol    Review of Systems   Review of Systems  Constitutional:  Negative for fever.  HENT:  Negative for sore throat.   Eyes:  Negative for visual disturbance.  Respiratory:  Negative for cough and shortness of breath.   Cardiovascular:  Negative for chest pain.  Gastrointestinal:  Negative  for abdominal pain, diarrhea and vomiting.  Genitourinary:  Negative for dysuria and flank pain.  Musculoskeletal:  Negative for back pain and neck pain.  Skin:  Negative for rash.  Neurological:  Negative for weakness and numbness.    Physical Exam Updated Vital Signs BP 122/70   Pulse 72   Temp 99.7 F (37.6 C) (Oral)   Resp 20   Wt 70.8 kg   SpO2 95%   BMI 22.38 kg/m  Physical Exam Vitals and nursing note reviewed.  Constitutional:      Appearance: Normal appearance. He is well-developed.  HENT:     Head:     Comments: Contusion posterior scalp.     Nose: Nose normal.     Mouth/Throat:     Mouth: Mucous membranes are moist.     Pharynx:  Oropharynx is clear.  Eyes:     General: No scleral icterus.    Conjunctiva/sclera: Conjunctivae normal.     Pupils: Pupils are equal, round, and reactive to light.  Neck:     Vascular: No carotid bruit.     Trachea: No tracheal deviation.     Comments: No stiffness or rigidity.  Cardiovascular:     Rate and Rhythm: Normal rate and regular rhythm.     Pulses: Normal pulses.     Heart sounds: Normal heart sounds. No murmur heard.    No friction rub. No gallop.  Pulmonary:     Effort: Pulmonary effort is normal. No accessory muscle usage or respiratory distress.     Breath sounds: Normal breath sounds.  Abdominal:     General: Bowel sounds are normal. There is no distension.     Palpations: Abdomen is soft.     Tenderness: There is no abdominal tenderness.  Genitourinary:    Comments: No cva tenderness. Musculoskeletal:        General: No swelling or tenderness.     Cervical back: Normal range of motion and neck supple. No rigidity.     Comments: CTLS spine, non tender, aligned, no step off. Good rom bil extremities without pain or focal bony tenderness.  Superficial abrasion wrist.   Skin:    General: Skin is warm and dry.     Findings: No rash.  Neurological:     Mental Status: He is alert.     Comments: Alert, speech clear. Motor/sens grossly intact bil.   Psychiatric:        Mood and Affect: Mood normal.     ED Results / Procedures / Treatments   Labs (all labs ordered are listed, but only abnormal results are displayed) Results for orders placed or performed during the hospital encounter of 09/10/23  CBC  Result Value Ref Range   WBC 11.0 (H) 4.0 - 10.5 K/uL   RBC 3.13 (L) 4.22 - 5.81 MIL/uL   Hemoglobin 10.2 (L) 13.0 - 17.0 g/dL   HCT 16.1 (L) 09.6 - 04.5 %   MCV 101.0 (H) 80.0 - 100.0 fL   MCH 32.6 26.0 - 34.0 pg   MCHC 32.3 30.0 - 36.0 g/dL   RDW 40.9 81.1 - 91.4 %   Platelets 171 150 - 400 K/uL   nRBC 0.0 0.0 - 0.2 %  Basic metabolic panel  Result Value  Ref Range   Sodium 137 135 - 145 mmol/L   Potassium 4.6 3.5 - 5.1 mmol/L   Chloride 105 98 - 111 mmol/L   CO2 23 22 - 32 mmol/L   Glucose, Bld 102 (H) 70 -  99 mg/dL   BUN 36 (H) 8 - 23 mg/dL   Creatinine, Ser 6.57 (H) 0.61 - 1.24 mg/dL   Calcium 9.1 8.9 - 84.6 mg/dL   GFR, Estimated 23 (L) >60 mL/min   Anion gap 9 5 - 15  Urinalysis, Routine w reflex microscopic -Urine, Clean Catch  Result Value Ref Range   Color, Urine YELLOW YELLOW   APPearance HAZY (A) CLEAR   Specific Gravity, Urine 1.015 1.005 - 1.030   pH 6.0 5.0 - 8.0   Glucose, UA NEGATIVE NEGATIVE mg/dL   Hgb urine dipstick NEGATIVE NEGATIVE   Bilirubin Urine NEGATIVE NEGATIVE   Ketones, ur NEGATIVE NEGATIVE mg/dL   Protein, ur 962 (A) NEGATIVE mg/dL   Nitrite NEGATIVE NEGATIVE   Leukocytes,Ua NEGATIVE NEGATIVE   RBC / HPF 0-5 0 - 5 RBC/hpf   WBC, UA 0-5 0 - 5 WBC/hpf   Bacteria, UA RARE (A) NONE SEEN   Squamous Epithelial / HPF 0-5 0 - 5 /HPF   Mucus PRESENT      EKG EKG Interpretation Date/Time:  Thursday September 10 2023 17:24:22 EST Ventricular Rate:  68 PR Interval:  181 QRS Duration:  94 QT Interval:  376 QTC Calculation: 400 R Axis:   41  Text Interpretation: Sinus rhythm Non-specific ST-t changes Confirmed by Cathren Laine ([id number]) on 09/10/2023 5:27:31 PM  Radiology CT Cervical Spine Wo Contrast  Result Date: 09/10/2023 CLINICAL DATA:  Head trauma EXAM: CT HEAD WITHOUT CONTRAST CT CERVICAL SPINE WITHOUT CONTRAST TECHNIQUE: Multidetector CT imaging of the head and cervical spine was performed following the standard protocol without intravenous contrast. Multiplanar CT image reconstructions of the cervical spine were also generated. RADIATION DOSE REDUCTION: This exam was performed according to the departmental dose-optimization program which includes automated exposure control, adjustment of the mA and/or kV according to patient size and/or use of iterative reconstruction technique. COMPARISON:  None  Available. FINDINGS: CT HEAD FINDINGS Brain: There is no mass, hemorrhage or extra-axial collection. There is generalized atrophy without lobar predilection. There is hypoattenuation of the periventricular white matter, most commonly indicating chronic ischemic microangiopathy. Vascular: Atherosclerotic calcification of the internal carotid arteries at the skull base. No abnormal hyperdensity of the major intracranial arteries or dural venous sinuses. Skull: The visualized skull base, calvarium and extracranial soft tissues are normal. Sinuses/Orbits: No fluid levels or advanced mucosal thickening of the visualized paranasal sinuses. No mastoid or middle ear effusion. The orbits are normal. CT CERVICAL SPINE FINDINGS Alignment: No static subluxation. Facets are aligned. Occipital condyles are normally positioned. Skull base and vertebrae: No acute fracture. Soft tissues and spinal canal: No prevertebral fluid or swelling. No visible canal hematoma. Disc levels: No advanced spinal canal or neural foraminal stenosis. Upper chest: No pneumothorax, pulmonary nodule or pleural effusion. Other: Normal visualized paraspinal cervical soft tissues. IMPRESSION: 1. No acute intracranial abnormality. 2. Chronic ischemic microangiopathy and generalized atrophy. 3. No acute fracture or static subluxation of the cervical spine. Electronically Signed   By: Deatra Robinson M.D.   On: 09/10/2023 19:53   CT Head Wo Contrast  Result Date: 09/10/2023 CLINICAL DATA:  Head trauma EXAM: CT HEAD WITHOUT CONTRAST CT CERVICAL SPINE WITHOUT CONTRAST TECHNIQUE: Multidetector CT imaging of the head and cervical spine was performed following the standard protocol without intravenous contrast. Multiplanar CT image reconstructions of the cervical spine were also generated. RADIATION DOSE REDUCTION: This exam was performed according to the departmental dose-optimization program which includes automated exposure control, adjustment of the mA and/or  kV  according to patient size and/or use of iterative reconstruction technique. COMPARISON:  None Available. FINDINGS: CT HEAD FINDINGS Brain: There is no mass, hemorrhage or extra-axial collection. There is generalized atrophy without lobar predilection. There is hypoattenuation of the periventricular white matter, most commonly indicating chronic ischemic microangiopathy. Vascular: Atherosclerotic calcification of the internal carotid arteries at the skull base. No abnormal hyperdensity of the major intracranial arteries or dural venous sinuses. Skull: The visualized skull base, calvarium and extracranial soft tissues are normal. Sinuses/Orbits: No fluid levels or advanced mucosal thickening of the visualized paranasal sinuses. No mastoid or middle ear effusion. The orbits are normal. CT CERVICAL SPINE FINDINGS Alignment: No static subluxation. Facets are aligned. Occipital condyles are normally positioned. Skull base and vertebrae: No acute fracture. Soft tissues and spinal canal: No prevertebral fluid or swelling. No visible canal hematoma. Disc levels: No advanced spinal canal or neural foraminal stenosis. Upper chest: No pneumothorax, pulmonary nodule or pleural effusion. Other: Normal visualized paraspinal cervical soft tissues. IMPRESSION: 1. No acute intracranial abnormality. 2. Chronic ischemic microangiopathy and generalized atrophy. 3. No acute fracture or static subluxation of the cervical spine. Electronically Signed   By: Deatra Robinson M.D.   On: 09/10/2023 19:53    Procedures Procedures    Medications Ordered in ED Medications - No data to display  ED Course/ Medical Decision Making/ A&P                                 Medical Decision Making Problems Addressed: Accidental fall, initial encounter: acute illness or injury with systemic symptoms that poses a threat to life or bodily functions Contusion of scalp, initial encounter: acute illness or injury with systemic symptoms that poses  a threat to life or bodily functions Head injury with loss of consciousness (HCC): acute illness or injury with systemic symptoms that poses a threat to life or bodily functions Stage 3b chronic kidney disease (HCC): chronic illness or injury with exacerbation, progression, or side effects of treatment that poses a threat to life or bodily functions  Amount and/or Complexity of Data Reviewed Independent Historian: EMS    Details: hx External Data Reviewed: labs and notes. Labs: ordered. Decision-making details documented in ED Course. Radiology: ordered and independent interpretation performed. Decision-making details documented in ED Course. ECG/medicine tests: ordered and independent interpretation performed. Decision-making details documented in ED Course.  Risk OTC drugs. Prescription drug management. Decision regarding hospitalization.  Iv ns. Continuous pulse ox and cardiac monitoring. Labs ordered/sent. Imaging ordered.   Differential diagnosis includes fall, head injury, etc. Dispo decision including potential need for admission considered - will get labs and imaging and reassess.   Reviewed nursing notes and prior charts for additional history. External reports reviewed.   Cardiac monitor: sinus rhythm, rate 68.  Labs reviewed/interpreted by me - wbc 11, hct 32. Ckd. K normal.   CT reviewed/interpreted by me - no hem.   [employer] rep interrogated pacer and calls with report that all working fine, no events.   Recheck pt, alert, comfortable no pain or distress. Pt indicates feels ready for d/c.   Tetanus IM.   Pt currently appears stable for d/c.   Rec close pcp f/u.  Return precautions provided.          Final Clinical Impression(s) / ED Diagnoses Final diagnoses:  Accidental fall, initial encounter  Head injury with loss of consciousness (HCC)  Contusion of scalp, initial encounter  Stage 3b chronic kidney disease [facility])    Rx / DC Orders ED  Discharge Orders     None          Cathren Laine, MD 09/10/23 [phone]

## 2023-09-10 NOTE — ED Notes (Signed)
Pacemaker interrogation data being transmitted.

## 2023-09-10 NOTE — ED Notes (Signed)
Wife Vaibhav Fogleman [phone]([phone])/ [phone] (home) would like an update immediately

## 2023-09-11 DIAGNOSIS — I10 Essential (primary) hypertension: Secondary | ICD-10-CM | POA: Diagnosis not present

## 2023-09-11 DIAGNOSIS — Z7401 Bed confinement status: Secondary | ICD-10-CM | POA: Diagnosis not present

## 2023-09-21 DIAGNOSIS — S069X9A Unspecified intracranial injury with loss of consciousness of unspecified duration, initial encounter: Secondary | ICD-10-CM | POA: Diagnosis not present

## 2023-09-21 DIAGNOSIS — Z139 Encounter for screening, unspecified: Secondary | ICD-10-CM | POA: Diagnosis not present

## 2023-09-21 DIAGNOSIS — N1832 Chronic kidney disease, stage 3b: Secondary | ICD-10-CM | POA: Diagnosis not present

## 2023-09-21 DIAGNOSIS — W19XXXA Unspecified fall, initial encounter: Secondary | ICD-10-CM | POA: Diagnosis not present

## 2023-09-21 DIAGNOSIS — S0003XA Contusion of scalp, initial encounter: Secondary | ICD-10-CM | POA: Diagnosis not present

## 2023-09-21 DIAGNOSIS — R2689 Other abnormalities of gait and mobility: Secondary | ICD-10-CM | POA: Diagnosis not present

## 2023-09-22 NOTE — H&P (Signed)
Patient's anticipated LOS is less than 2 midnights, meeting these requirements: - Younger than 27 - Lives within 1 hour of care - Has a competent adult at home to recover with post-op recover - NO history of  - Chronic pain requiring opiods  - Diabetes  - Coronary Artery Disease  - Heart failure  - Heart attack  - Stroke  - DVT/VTE  - Cardiac arrhythmia  - Respiratory Failure/COPD  - Renal failure  - Anemia  - Advanced Liver disease     [facility]kerson is an 82 y.o. male.    Chief Complaint: right shoulder pain  HPI: Pt is a 82 y.o. male complaining of right shoulder pain for multiple years. Pain had continually increased since the beginning. X-rays in the clinic show end-stage arthritic changes of the right shoulder. Pt has tried various conservative treatments which have failed to alleviate their symptoms, including injections and therapy. Various options are discussed with the patient. Risks, benefits and expectations were discussed with the patient. Patient understand the risks, benefits and expectations and wishes to proceed with surgery.   PCP:  Paulina Fusi, MD  D/C Plans: Home  PMH: Past Medical History:  Diagnosis Date   Abnormality of gait 02/07/2015   AICD (automatic cardioverter/defibrillator) present    [employer] - Graciela Husbands   Arthritis    shoulders, pinched nerve around waist and back   CAD (coronary artery disease)    Hx of Inf MI treated with CABG in 1999 // Myoview 11/16: mild inf-lat ischemia, EF 34, high risk // LHC 3/19: 3/3 bypass grafts patent   Carotid artery disease (HCC)    Followed by [facility] (Dr. Myra Gianotti) // [place] in 2/19: bilat ICA 40-59   CHF (congestive heart failure) (HCC) 04/14/2023   lvef 55 to 60 % per echo   Chronic systolic CHF (congestive heart failure) (HCC) 01/01/2010   Echo 12/30/2017: Mild LVH, EF 30-35, diffuse HK, grade 1 diastolic dysfunction, mildly reduced RVSF // Echo 04/12/2016:  EF 35-40, PASP 39, mild to moderate TR,  mild LAE // Echo (12/14):  Base/mid inferior and inferolateral HK, EF 45%, mild LAE, mildly reduced RVSF   CKD (chronic kidney disease) Stage 3 B    followed by dr peoples q year lov 04-23-2022 on chart   Depression    HLP (hyperkeratosis lenticularis perstans)    HTN (hypertension)    ICD (implantable cardiac defibrillator)  BSx    [employer]   Ischemic cardiomyopathy    Myocardial infarction (HCC) 1999   Near syncope 09/21/2014   Orthostatic hypotension    PAF (paroxysmal atrial fibrillation) (HCC)    Presence of permanent cardiac pacemaker    Wears glasses    Wears partial dentures    upper    PSH: Past Surgical History:  Procedure Laterality Date   AMPUTATION TOE Left 06/17/2023   Procedure: LEFT 2ND TOE AMPUTATION THRU PROXIMAL PHALANX;  Surgeon: [name], MD;  Location: [facility];  Service: Orthopedics;  Laterality: Left;   CARDIAC CATHETERIZATION N/A 12/11/2015   Procedure: Left Heart Cath and Coronary Angiography;  Surgeon: Rinaldo Cloud, MD;  Location: [facility] INVASIVE CV LAB;  Service: Cardiovascular;  Laterality: N/A;   CARDIAC DEFIBRILLATOR PLACEMENT     [employer]   CARDIAC DEFIBRILLATOR PLACEMENT  04/08/2016   NOT MRI SAFE (DOCUMENT SCANNED IN SYSTEM   COLONOSCOPY  10/06/2007   Mild sigmoid diverticulosis. Small internal hemorrhoids.    CORONARY ARTERY BYPASS GRAFT     ELBOW  SURGERY Left    plastic bone replacement   HERNIA REPAIR     x2   LEFT HEART CATH AND CORS/GRAFTS ANGIOGRAPHY N/A 01/07/2018   Procedure: LEFT HEART CATH AND CORS/GRAFTS ANGIOGRAPHY;  Surgeon: Kathleene Hazel, MD;  Location: [facility] INVASIVE CV LAB;  Service: Cardiovascular;  Laterality: N/A;   PERMANENT PACEMAKER GENERATOR CHANGE N/A 11/25/2013   Procedure: PERMANENT PACEMAKER GENERATOR CHANGE;  Surgeon: Duke Salvia, MD;  Location[facility] CATH LAB;  Service: Cardiovascular;  Laterality: N/A;    ROTATOR CUFF REPAIR Right 2006   SINUS EXPLORATION  1987   ULNAR  NERVE TRANSPOSITION Left 07/19/2019   Procedure: Ulnar nerve release at the elbow with anterior transposition and flexor pronator lengthening, anterior interosseous nerve transfer to the deep motor branch of the ulnar nerve at the forearm, flexor digitorum profundus tendon transfer of the ring and small to middle fingers, ulnar nerve release at the wrist, Guyons canal;  Surgeon: Dominica Severin, MD;  Location: [facility] OR;  Service: Orthoped   ULTRASOUND GUIDANCE FOR VASCULAR ACCESS  01/07/2018   Procedure: Ultrasound Guidance For Vascular Access;  Surgeon: Kathleene Hazel, MD;  Location: [facility] INVASIVE CV LAB;  Service: Cardiovascular;;    Social History:  reports that he quit smoking about 24 years ago. His smoking use included pipe. He has never been exposed to tobacco smoke. He has never used smokeless tobacco. He reports that he does not drink alcohol and does not use drugs. BMI: Estimated body mass index is 22.38 kg/m as calculated from the following:   Height as of 08/11/23: 5\' 10"  (1.778 m).   Weight as of 09/10/23: 70.8 kg.  Lab Results  Component Value Date   ALBUMIN 4.0 12/22/2020   Diabetes: Patient does not have a diagnosis of diabetes.     Smoking Status:      Allergies:  Allergies  Allergen Reactions   Tramadol Other (See Comments)    Unknown     Medications: No current facility-administered medications for this encounter.   Current Outpatient Medications  Medication Sig Dispense Refill   amLODipine (NORVASC) 2.5 MG tablet Take 2 tablets (5 mg total) by mouth daily. (Patient taking differently: Take 2.5 mg by mouth daily. Taking 1 tab daily) 180 tablet 3   buPROPion (WELLBUTRIN SR) 150 MG 12 hr tablet Take 300 mg by mouth daily.     cyclobenzaprine (FLEXERIL) 5 MG tablet Take 5 mg by mouth 3 (three) times daily as needed for muscle spasms. (Patient not taking: Reported on 08/11/2023)     ELIQUIS 2.5 MG TABS tablet TAKE 1 TABLET BY MOUTH TWICE A DAY 180 tablet 1    escitalopram (LEXAPRO) 10 MG tablet Take 10 mg by mouth daily.     famotidine (PEPCID) 20 MG tablet Take 20 mg by mouth 2 (two) times daily.     gabapentin (NEURONTIN) 300 MG capsule Take 900 mg by mouth at bedtime.     hydrALAZINE (APRESOLINE) 25 MG tablet Take 1 tablet twice daily as needed for systolic blood pressure (top number) greater than 160. 180 tablet 3   nitroGLYCERIN (NITROSTAT) 0.4 MG SL tablet Place 1 tablet (0.4 mg total) under the tongue every 5 (five) minutes as needed for chest pain. 25 tablet 11   oxyCODONE-acetaminophen (PERCOCET) 10-325 MG tablet Take 1 tablet by mouth 4 (four) times daily as needed for pain.     rosuvastatin (CRESTOR) 40 MG tablet TAKE 1 TABLET BY MOUTH EVERY DAY 90 tablet 2   sacubitril-valsartan (ENTRESTO)  24-26 MG Take 1/2 tablet by mouth 2 times daily. 90 tablet 3   traZODone (DESYREL) 50 MG tablet Take 50 mg by mouth at bedtime.      No results found for this or any previous visit (from the past 48 hour(s)). No results found.  ROS: Pain with rom of the right upper extremity  Physical Exam: Alert and oriented 82 y.o. male in no acute distress Cranial nerves 2-12 intact Cervical spine: full rom with no tenderness, nv intact distally Chest: active breath sounds bilaterally, no wheeze rhonchi or rales Heart: regular rate and rhythm, no murmur Abd: non tender non distended with active bowel sounds Hip is stable with rom  Right shoulder painful and weak rom Nv intact distally No rashes or edema  Assessment/Plan Assessment: right shoulder cuff arthropathy  Plan:  Patient will undergo a right reverse total shoulder by Dr. Ranell Patrick at [place] Risks benefits and expectations were discussed with the patient. Patient understand risks, benefits and expectations and wishes to proceed. Preoperative templating of the joint replacement has been completed, documented, and submitted to the Operating Room personnel in order to optimize intra-operative equipment  management.   Alphonsa Overall PA-C, MPAS [facility] is now [employer] [address], [address], [place], [place] [address] Phone: [phone] [email or web address] Facebook  [employer]

## 2023-10-02 ENCOUNTER — Ambulatory Visit (INDEPENDENT_AMBULATORY_CARE_PROVIDER_SITE_OTHER): Payer: Medicare Other

## 2023-10-02 DIAGNOSIS — I255 Ischemic cardiomyopathy: Secondary | ICD-10-CM

## 2023-10-02 LAB — CUP PACEART REMOTE DEVICE CHECK
Battery Remaining Longevity: 18 mo
Battery Remaining Percentage: 24 %
Brady Statistic RA Percent Paced: 72 %
Brady Statistic RV Percent Paced: 0 %
Date Time Interrogation Session: 20241129004100
HighPow Impedance: 45 Ohm
Implantable Lead Connection Status: 753985
Implantable Lead Connection Status: 753985
Implantable Lead Implant Date: 20060512
Implantable Lead Implant Date: 20060512
Implantable Lead Location: 753859
Implantable Lead Location: 753860
Implantable Lead Model: 158
Implantable Lead Model: 5076
Implantable Lead Serial Number: 159477
Implantable Pulse Generator Implant Date: 20150123
Lead Channel Impedance Value: 371 Ohm
Lead Channel Impedance Value: 566 Ohm
Lead Channel Pacing Threshold Amplitude: 0.8 V
Lead Channel Pacing Threshold Amplitude: 0.9 V
Lead Channel Pacing Threshold Pulse Width: 0.4 ms
Lead Channel Pacing Threshold Pulse Width: 0.4 ms
Lead Channel Setting Pacing Amplitude: 2 V
Lead Channel Setting Pacing Amplitude: 2.4 V
Lead Channel Setting Pacing Pulse Width: 0.4 ms
Lead Channel Setting Sensing Sensitivity: 0.6 mV
Pulse Gen Serial Number: 112776

## 2023-10-05 NOTE — Progress Notes (Signed)
COVID Vaccine Completed:  Date of COVID positive in last 90 days:  PCP - Foye Deer, MD Cardiologist - Nanetta Batty, MD Electrophysiologist- Sherryl Manges, MD  Chest x-ray -  EKG -  Stress Test -  ECHO -  Cardiac Cath -  Pacemaker/ICD device last checked: Spinal Cord Stimulator:  Bowel Prep -   Sleep Study -  CPAP -   Fasting Blood Sugar -  Checks Blood Sugar _____ times a day  Last dose of GLP1 agonist-  N/A GLP1 instructions:  Hold 7 days before surgery    Last dose of SGLT-2 inhibitors-  N/A SGLT-2 instructions:  Hold 3 days before surgery    Blood Thinner Instructions:  Time Aspirin Instructions: Last Dose:  Activity level:  Can go up a flight of stairs and perform activities of daily living without stopping and without symptoms of chest pain or shortness of breath.  Able to exercise without symptoms  Unable to go up a flight of stairs without symptoms of     Anesthesia review: CHF, HTN, mardiomyopathy, CAD, PVC, CKD, OSA a fib  Patient denies shortness of breath, fever, cough and chest pain at PAT appointment  Patient verbalized understanding of instructions that were given to them at the PAT appointment. Patient was also instructed that they will need to review over the PAT instructions again at home before surgery.

## 2023-10-07 ENCOUNTER — Other Ambulatory Visit: Payer: Self-pay

## 2023-10-07 ENCOUNTER — Encounter (HOSPITAL_COMMUNITY)
Admission: RE | Admit: 2023-10-07 | Discharge: 2023-10-07 | Disposition: A | Discharge status: Home / Self Care | Payer: Medicare PPO | Source: Ambulatory Visit | Attending: Orthopedic Surgery | Admitting: Orthopedic Surgery

## 2023-10-07 ENCOUNTER — Encounter (HOSPITAL_COMMUNITY): Payer: Self-pay

## 2023-10-07 VITALS — BP 131/83 | HR 62 | Temp 97.8°F | Resp 16 | Ht 70.0 in | Wt 151.0 lb

## 2023-10-07 DIAGNOSIS — Z01818 Encounter for other preprocedural examination: Secondary | ICD-10-CM

## 2023-10-07 DIAGNOSIS — I10 Essential (primary) hypertension: Secondary | ICD-10-CM | POA: Diagnosis not present

## 2023-10-07 DIAGNOSIS — Z01812 Encounter for preprocedural laboratory examination: Secondary | ICD-10-CM | POA: Diagnosis not present

## 2023-10-07 HISTORY — DX: Unspecified asthma, uncomplicated: J45.909

## 2023-10-07 LAB — BASIC METABOLIC PANEL
Anion gap: 8 (ref 5–15)
BUN: 37 mg/dL — ABNORMAL HIGH (ref 8–23)
CO2: 23 mmol/L (ref 22–32)
Calcium: 9.5 mg/dL (ref 8.9–10.3)
Chloride: 109 mmol/L (ref 98–111)
Creatinine, Ser: 2.15 mg/dL — ABNORMAL HIGH (ref 0.61–1.24)
GFR, Estimated: 30 mL/min — ABNORMAL LOW (ref >60)
Glucose, Bld: 97 mg/dL (ref 70–99)
Potassium: 5.1 mmol/L (ref 3.5–5.1)
Sodium: 140 mmol/L (ref 135–145)

## 2023-10-07 LAB — CBC
HCT: 36.5 % — ABNORMAL LOW (ref 39.0–52.0)
Hemoglobin: 11.3 g/dL — ABNORMAL LOW (ref 13.0–17.0)
MCH: 32.8 pg (ref 26.0–34.0)
MCHC: 31 g/dL (ref 30.0–36.0)
MCV: 105.8 fL — ABNORMAL HIGH (ref 80.0–100.0)
Platelets: 161 10*3/uL (ref 150–400)
RBC: 3.45 MIL/uL — ABNORMAL LOW (ref 4.22–5.81)
RDW: 12.8 % (ref 11.5–15.5)
WBC: 7.4 10*3/uL (ref 4.0–10.5)
nRBC: 0 % (ref 0.0–0.2)

## 2023-10-07 LAB — SURGICAL PCR SCREEN
MRSA, PCR: NEGATIVE
Staphylococcus aureus: NEGATIVE

## 2023-10-07 NOTE — Patient Instructions (Addendum)
SURGICAL WAITING ROOM VISITATION  Patients having surgery or a procedure may have no more than 2 support people in the waiting area - these visitors may rotate.    Children under the age of 60 must have an adult with them who is not the patient.  Due to an increase in RSV and influenza rates and associated hospitalizations, children ages 51 and under may not visit patients in [facility] hospitals.  If the patient needs to stay at the hospital during part of their recovery, the visitor guidelines for inpatient rooms apply. Pre-op nurse will coordinate an appropriate time for 1 support person to accompany patient in pre-op.  This support person may not rotate.    Please refer to the [facility] website for the visitor guidelines for Inpatients (after your surgery is over and you are in a regular room).    Your procedure is scheduled on: 10/16/23   Report to [facility] Main Entrance    Report to admitting at 7:30 AM   Call this number if you have problems the morning of surgery [phone]   Do not eat food :After Midnight.   After Midnight you may have the following liquids until 7:00 AM DAY OF SURGERY  Water Non-Citrus Juices (without pulp, NO RED-Apple, White grape, White cranberry) Black Coffee (NO MILK/CREAM OR CREAMERS, sugar ok)  Clear Tea (NO MILK/CREAM OR CREAMERS, sugar ok) regular and decaf                             Plain Jell-O (NO RED)                                           Fruit ices (not with fruit pulp, NO RED)                                     Popsicles (NO RED)                                                               Sports drinks like Gatorade (NO RED)                 The day of surgery:  Drink ONE (1) Pre-Surgery Clear Ensure at 7:00 AM the morning of surgery. Drink in one sitting. Do not sip.  This drink was given to you during your hospital  pre-op appointment visit. Nothing else to drink after completing the  Pre-Surgery Clear  Ensure.          If you have questions, please contact your surgeon's office.   FOLLOW BOWEL PREP AND ANY ADDITIONAL PRE OP INSTRUCTIONS YOU RECEIVED FROM YOUR SURGEON'S OFFICE!!!     Oral Hygiene is also important to reduce your risk of infection.                                    Remember - BRUSH YOUR TEETH THE MORNING OF SURGERY WITH YOUR REGULAR TOOTHPASTE  DENTURES WILL  BE REMOVED PRIOR TO SURGERY PLEASE DO NOT APPLY "Poly grip" OR ADHESIVES!!!   Stop all vitamins and herbal supplements 7 days before surgery.   Take these medicines the morning of surgery with A SIP OF WATER: Amlodipine, Bupropion, Lexapro, Famotidine, Hydralazine, Rosuvastatin              You may not have any metal on your body including jewelry, and body piercing             Do not wear lotions, powders, cologne, or deodorant              Men may shave face and neck.   Do not bring valuables to the hospital. [facility] IS NOT             RESPONSIBLE   FOR VALUABLES.   Contacts, glasses, dentures or bridgework may not be worn into surgery.   Bring small overnight bag day of surgery.   DO NOT BRING YOUR HOME MEDICATIONS TO THE HOSPITAL. PHARMACY WILL DISPENSE MEDICATIONS LISTED ON YOUR MEDICATION LIST TO YOU DURING YOUR ADMISSION IN THE HOSPITAL!   Special Instructions: Bring a copy of your healthcare power of attorney and living will documents the day of surgery if you haven't scanned them before.              Please read over the following fact sheets you were given: IF YOU HAVE QUESTIONS ABOUT YOUR PRE-OP INSTRUCTIONS PLEASE CALL [name]   If you received a COVID test during your pre-op visit  it is requested that you wear a mask when out in public, stay away from anyone that may not be feeling well and notify your surgeon if you develop symptoms. If you test positive for Covid or have been in contact with anyone that has tested positive in the last 10 days please notify you surgeon.       Pre-operative 5 CHG Bath Instructions   You can play a key role in reducing the risk of infection after surgery. Your skin needs to be as free of germs as possible. You can reduce the number of germs on your skin by washing with CHG (chlorhexidine gluconate) soap before surgery. CHG is an antiseptic soap that kills germs and continues to kill germs even after washing.   DO NOT use if you have an allergy to chlorhexidine/CHG or antibacterial soaps. If your skin becomes reddened or irritated, stop using the CHG and notify one of our RNs at [phone].   Please shower with the CHG soap starting 4 days before surgery using the following schedule:     Please keep in mind the following:  DO NOT shave, including legs and underarms, starting the day of your first shower.   You may shave your face at any point before/day of surgery.  Place clean sheets on your bed the day you start using CHG soap. Use a clean washcloth (not used since being washed) for each shower. DO NOT sleep with pets once you start using the CHG.   CHG Shower Instructions:  If you choose to wash your hair and private area, wash first with your normal shampoo/soap.  After you use shampoo/soap, rinse your hair and body thoroughly to remove shampoo/soap residue.  Turn the water OFF and apply about 3 tablespoons (45 ml) of CHG soap to a CLEAN washcloth.  Apply CHG soap ONLY FROM YOUR NECK DOWN TO YOUR TOES (washing for 3-5 minutes)  DO NOT use CHG soap on  face, private areas, open wounds, or sores.  Pay special attention to the area where your surgery is being performed.  If you are having back surgery, having someone wash your back for you may be helpful. Wait 2 minutes after CHG soap is applied, then you may rinse off the CHG soap.  Pat dry with a clean towel  Put on clean clothes/pajamas   If you choose to wear lotion, please use ONLY the CHG-compatible lotions on the back of this paper.     Additional instructions for  the day of surgery: DO NOT APPLY any lotions, deodorants, cologne, or perfumes.   Put on clean/comfortable clothes.  Brush your teeth.  Ask your nurse before applying any prescription medications to the skin.      CHG Compatible Lotions   Aveeno Moisturizing lotion  Cetaphil Moisturizing Cream  Cetaphil Moisturizing Lotion  Clairol Herbal Essence Moisturizing Lotion, Dry Skin  Clairol Herbal Essence Moisturizing Lotion, Extra Dry Skin  Clairol Herbal Essence Moisturizing Lotion, Normal Skin  Curel Age Defying Therapeutic Moisturizing Lotion with Alpha Hydroxy  Curel Extreme Care Body Lotion  Curel Soothing Hands Moisturizing Hand Lotion  Curel Therapeutic Moisturizing Cream, Fragrance-Free  Curel Therapeutic Moisturizing Lotion, Fragrance-Free  Curel Therapeutic Moisturizing Lotion, Original Formula  Eucerin Daily Replenishing Lotion  Eucerin Dry Skin Therapy Plus Alpha Hydroxy Crme  Eucerin Dry Skin Therapy Plus Alpha Hydroxy Lotion  Eucerin Original Crme  Eucerin Original Lotion  Eucerin Plus Crme Eucerin Plus Lotion  Eucerin TriLipid Replenishing Lotion  Keri Anti-Bacterial Hand Lotion  Keri Deep Conditioning Original Lotion Dry Skin Formula Softly Scented  Keri Deep Conditioning Original Lotion, Fragrance Free Sensitive Skin Formula  Keri Lotion Fast Absorbing Fragrance Free Sensitive Skin Formula  Keri Lotion Fast Absorbing Softly Scented Dry Skin Formula  Keri Original Lotion  Keri Skin Renewal Lotion Keri Silky Smooth Lotion  Keri Silky Smooth Sensitive Skin Lotion  Nivea Body Creamy Conditioning Oil  Nivea Body Extra Enriched Lotion  Nivea Body Original Lotion  Nivea Body Sheer Moisturizing Lotion Nivea Crme  Nivea Skin Firming Lotion  NutraDerm 30 Skin Lotion  NutraDerm Skin Lotion  NutraDerm Therapeutic Skin Cream  NutraDerm Therapeutic Skin Lotion  ProShield Protective Hand Cream  Provon moisturizing lotion   Incentive Spirometer  An incentive  spirometer is a tool that can help keep your lungs clear and active. This tool measures how well you are filling your lungs with each breath. Taking long deep breaths may help reverse or decrease the chance of developing breathing (pulmonary) problems (especially infection) following: A long period of time when you are unable to move or be active. BEFORE THE PROCEDURE  If the spirometer includes an indicator to show your best effort, your nurse or respiratory therapist will set it to a desired goal. If possible, sit up straight or lean slightly forward. Try not to slouch. Hold the incentive spirometer in an upright position. INSTRUCTIONS FOR USE  Sit on the edge of your bed if possible, or sit up as far as you can in bed or on a chair. Hold the incentive spirometer in an upright position. Breathe out normally. Place the mouthpiece in your mouth and seal your lips tightly around it. Breathe in slowly and as deeply as possible, raising the piston or the ball toward the top of the column. Hold your breath for 3-5 seconds or for as long as possible. Allow the piston or ball to fall to the bottom of the column. Remove the mouthpiece from your  mouth and breathe out normally. Rest for a few seconds and repeat Steps 1 through 7 at least 10 times every 1-2 hours when you are awake. Take your time and take a few normal breaths between deep breaths. The spirometer may include an indicator to show your best effort. Use the indicator as a goal to work toward during each repetition. After each set of 10 deep breaths, practice coughing to be sure your lungs are clear. If you have an incision (the cut made at the time of surgery), support your incision when coughing by placing a pillow or rolled up towels firmly against it. Once you are able to get out of bed, walk around indoors and cough well. You may stop using the incentive spirometer when instructed by your caregiver.  RISKS AND COMPLICATIONS Take your time  so you do not get dizzy or light-headed. If you are in pain, you may need to take or ask for pain medication before doing incentive spirometry. It is harder to take a deep breath if you are having pain. AFTER USE Rest and breathe slowly and easily. It can be helpful to keep track of a log of your progress. Your caregiver can provide you with a simple table to help with this. If you are using the spirometer at home, follow these instructions: SEEK MEDICAL CARE IF:  You are having difficultly using the spirometer. You have trouble using the spirometer as often as instructed. Your pain medication is not giving enough relief while using the spirometer. You develop fever of 100.5 F (38.1 C) or higher. SEEK IMMEDIATE MEDICAL CARE IF:  You cough up bloody sputum that had not been present before. You develop fever of 102 F (38.9 C) or greater. You develop worsening pain at or near the incision site. MAKE SURE YOU:  Understand these instructions. Will watch your condition. Will get help right away if you are not doing well or get worse. Document Released: 03/02/2007 Document Revised: 01/12/2012 Document Reviewed: 05/03/2007 ExitCare Patient Information 2014 [place] [employer].   ________________________________________________________________________ [facility]- Preparing for Total Shoulder Arthroplasty    Before surgery, you can play an important role. Because skin is not sterile, your skin needs to be as free of germs as possible. You can reduce the number of germs on your skin by using the following products. Benzoyl Peroxide Gel Reduces the number of germs present on the skin Applied twice a day to shoulder area starting two days before surgery    ==================================================================  Please follow these instructions carefully:  BENZOYL PEROXIDE 5% GEL  Please do not use if you have an allergy to benzoyl peroxide.   If your skin becomes  reddened/irritated stop using the benzoyl peroxide.  Starting two days before surgery, apply as follows: Apply benzoyl peroxide in the morning and at night. Apply after taking a shower. If you are not taking a shower clean entire shoulder front, back, and side along with the armpit with a clean wet washcloth.  Place a quarter-sized dollop on your shoulder and rub in thoroughly, making sure to cover the front, back, and side of your shoulder, along with the armpit.   2 days before ____ AM   ____ PM              1 day before ____ AM   ____ PM                         Do this twice a  day for two days.  (Last application is the night before surgery, AFTER using the CHG soap as described below).  Do NOT apply benzoyl peroxide gel on the day of surgery.

## 2023-10-08 NOTE — Progress Notes (Signed)
Creatinine 2.15, results routed to Dr. Ranell Patrick

## 2023-10-09 ENCOUNTER — Encounter (HOSPITAL_COMMUNITY): Payer: Self-pay

## 2023-10-09 NOTE — Progress Notes (Signed)
Case: [id number] Date/Time: 10/16/23 0945   Procedure: REVERSE SHOULDER ARTHROPLASTY (Right: Shoulder) - 100   Anesthesia type: General   Pre-op diagnosis: Right shoulder rotator cuff arthropathy   Location: [facility] / [facility]   Surgeons: Beverely Low, MD       DISCUSSION: Connor Wilkerson ia an 82 yo male who presents to PAT prior to surgery above. PMH of former smoking, HTN, hx of MI (1999), CAD s/p CABG (1999), CHF, s/p ICD placement (2006), carotid artery disease, orthostatic hypotension, PAF on Eliquis, asthma, CKD, arthriti, anemia.  Patient follows with Cardiology for CAD s/p CABG, CHF s/p AICD placement, PAF on Eliquis, carotid artery disease. Most recent cath in 01/2018 showed patent grafts. Recent 2D echo performed 04/13/2022 revealed normal LV systolic function. Last seen by Dr. Allyson Sabal on 08/11/23 and noted to be doing well. Cleared for upcoming surgery:  "Since I saw him a year ago he did undergo a toe amputation recently.  He still needs his right shoulder operated on and I believe he can have this done at low risk."  Last dose of Eliquis will be 12/9 PM  Device orders in 06/26/23 note:  Electrophysiologist's Recommendations:   Have magnet available. Provide continuous ECG monitoring when magnet is used or reprogramming is to be performed.  Procedure will likely interfere with device function.  Device should be programmed:  Tachy therapies disabled VS: BP 131/83   Pulse 62   Temp 36.6 C (Oral)   Resp 16   Ht 5\' 10"  (1.778 m)   Wt 68.5 kg   SpO2 100%   BMI 21.67 kg/m   PROVIDERS: Paulina Fusi, MD Cardiology: Jeri Cos, MD EP: Harriett Sine: Labs reviewed: Acceptable for surgery. (all labs ordered are listed, but only abnormal results are displayed)  Labs Reviewed  BASIC METABOLIC PANEL - Abnormal; Notable for the following components:      Result Value   BUN 37 (*)    Creatinine, Ser 2.15 (*)    GFR, Estimated 30 (*)    All other components within  normal limits  CBC - Abnormal; Notable for the following components:   RBC 3.45 (*)    Hemoglobin 11.3 (*)    HCT 36.5 (*)    MCV 105.8 (*)    All other components within normal limits  SURGICAL PCR SCREEN     IMAGES:   EKG:   CV:  Device check 10/02/23:  Scheduled remote reviewed. Normal device function.  10 AHR detections, all on 08/23/23 from 11:22 am to 12:10 pm, longest lasting 46 minutes, EGMs consistent with AFL and FFS.  Known history of AF, on Eliquis per Epic.  Carotid US 07/01/2022: Summary: Right Carotid: Velocities in the right ICA are consistent with a 40-59%                stenosis. Non-hemodynamically significant plaque <50% noted in                the CCA. The ECA appears <50% stenosed. Diffuse heterogeneous and                calcific plaque in the carotid bifurcation.  Left Carotid: Velocities in the left ICA are consistent with a 40-59% stenosis.               Non-hemodynamically significant plaque <50% noted in the CCA. The               ECA appears <50% stenosed. Diffuse heterogeneous and  calcific               plaque in the carotid bifurcation.  Vertebrals:  Bilateral vertebral arteries demonstrate antegrade flow. Subclavians: Right subclavian artery flow was disturbed. Normal flow              hemodynamics were seen in the left subclavian artery.  *See table(s) above for measurements and observations.  Echo 04/14/2022: (scanned in Epic from [facility])  Conclusions: Paced rhythm A technically difficult study Overall LV systolic function is normal with and EF between 55-60% Left atrium is mildly dilated by volume Electronic pacemaker lead seen in the right atrial cavity  Past Medical History:  Diagnosis Date   Abnormality of gait 02/07/2015   Arthritis    shoulders, pinched nerve around waist and back   Asthma    CAD (coronary artery disease)    Hx of Inf MI treated with CABG in 1999 // Myoview 11/16: mild inf-lat ischemia, EF  34, high risk // [facility] 3/19: 3/3 bypass grafts patent   Carotid artery disease (HCC)    Followed by [facility] (Dr. Myra Gianotti) // [place] in 2/19: bilat ICA 40-59   Chronic systolic CHF (congestive heart failure) (HCC) 01/01/2010   Echo 12/30/2017: Mild LVH, EF 30-35, diffuse HK, grade 1 diastolic dysfunction, mildly reduced RVSF // Echo 04/12/2016:  EF 35-40, PASP 39, mild to moderate TR, mild LAE // Echo (12/14):  Base/mid inferior and inferolateral HK, EF 45%, mild LAE, mildly reduced RVSF   CKD (chronic kidney disease) Stage 3 B    followed by dr peoples q year lov 04-23-2022 on chart   Depression    HLP (hyperkeratosis lenticularis perstans)    HTN (hypertension)    ICD (implantable cardiac defibrillator)  BSx    [employer]   Ischemic cardiomyopathy    Myocardial infarction (HCC) 1999   Near syncope 09/21/2014   Orthostatic hypotension    PAF (paroxysmal atrial fibrillation) (HCC)    Wears glasses    Wears partial dentures    upper    Past Surgical History:  Procedure Laterality Date   AMPUTATION TOE Left 06/17/2023   Procedure: LEFT 2ND TOE AMPUTATION THRU PROXIMAL PHALANX;  Surgeon: [name], MD;  Location: [facility];  Service: Orthopedics;  Laterality: Left;   CARDIAC CATHETERIZATION N/A 12/11/2015   Procedure: Left Heart Cath and Coronary Angiography;  Surgeon: Rinaldo Cloud, MD;  Location: [facility] INVASIVE CV LAB;  Service: Cardiovascular;  Laterality: N/A;   CARDIAC DEFIBRILLATOR PLACEMENT     [employer]   CARDIAC DEFIBRILLATOR PLACEMENT  04/08/2016   NOT MRI SAFE (DOCUMENT SCANNED IN SYSTEM   COLONOSCOPY  10/06/2007   Mild sigmoid diverticulosis. Small internal hemorrhoids.    CORONARY ARTERY BYPASS GRAFT     ELBOW SURGERY Left    plastic bone replacement   HERNIA REPAIR     x2   LEFT HEART CATH AND CORS/GRAFTS ANGIOGRAPHY N/A 01/07/2018   Procedure: LEFT HEART CATH AND CORS/GRAFTS ANGIOGRAPHY;  Surgeon: Kathleene Hazel, MD;  Location: [facility] INVASIVE  CV LAB;  Service: Cardiovascular;  Laterality: N/A;   PERMANENT PACEMAKER GENERATOR CHANGE N/A 11/25/2013   Procedure: PERMANENT PACEMAKER GENERATOR CHANGE;  Surgeon: Duke Salvia, MD;  Location: [facility] CATH LAB;  Service: Cardiovascular;  Laterality: N/A;    ROTATOR CUFF REPAIR Right 2006   SINUS EXPLORATION  1987   ULNAR NERVE TRANSPOSITION Left 07/19/2019   Procedure: Ulnar nerve release at the elbow with anterior transposition and flexor pronator lengthening,  anterior interosseous nerve transfer to the deep motor branch of the ulnar nerve at the forearm, flexor digitorum profundus tendon transfer of the ring and small to middle fingers, ulnar nerve release at the wrist, Guyons canal;  Surgeon: Dominica Severin, MD;  Location: [facility] OR;  Service: Orthoped   ULTRASOUND GUIDANCE FOR VASCULAR ACCESS  01/07/2018   Procedure: Ultrasound Guidance For Vascular Access;  Surgeon: Kathleene Hazel, MD;  Location: [facility] INVASIVE CV LAB;  Service: Cardiovascular;;    MEDICATIONS:  amLODipine (NORVASC) 2.5 MG tablet   buPROPion (WELLBUTRIN SR) 150 MG 12 hr tablet   ELIQUIS 2.5 MG TABS tablet   escitalopram (LEXAPRO) 10 MG tablet   famotidine (PEPCID) 20 MG tablet   gabapentin (NEURONTIN) 300 MG capsule   hydrALAZINE (APRESOLINE) 25 MG tablet   nitroGLYCERIN (NITROSTAT) 0.4 MG SL tablet   rosuvastatin (CRESTOR) 40 MG tablet   sacubitril-valsartan (ENTRESTO) 24-26 MG   traZODone (DESYREL) 50 MG tablet   No current facility-administered medications for this encounter.   Marcille Blanco [facility] Surgical Short Stay/Anesthesiology [facility] Phone [phone] 10/09/2023 9:00 AM

## 2023-10-12 NOTE — Anesthesia Preprocedure Evaluation (Addendum)
Anesthesia Evaluation  Patient identified by MRN, date of birth, ID band Patient awake    Reviewed: Allergy & Precautions, NPO status , Patient's Chart, lab work & pertinent test results  Airway Mallampati: II  TM Distance: >3 FB Neck ROM: Full    Dental no notable dental hx. (+) Implants, Teeth Intact, Dental Advisory Given   Pulmonary former smoker   Pulmonary exam normal breath sounds clear to auscultation       Cardiovascular hypertension, + CAD, + Past MI (1999), + CABG (1999) and +CHF  Normal cardiovascular exam+ dysrhythmias (on eliquis) Atrial Fibrillation + Cardiac Defibrillator (2006)  Rhythm:Regular Rate:Normal  Ischemic cardiomyopathy 04/2022 ech o from [place] LV systolic function is normal with and EF between 55-60%   Neuro/Psych  PSYCHIATRIC DISORDERS  Depression       GI/Hepatic   Endo/Other    Renal/GU CRFRenal diseaseLab Results      Component                Value               Date                             K                        5.1                 10/07/2023                CO2                      23                  10/07/2023                BUN                      37 (H)              10/07/2023                CREATININE               2.15 (H)            10/07/2023                    Musculoskeletal  (+) Arthritis , Osteoarthritis,    Abdominal   Peds  Hematology  (+) Blood dyscrasia, anemia Lab Results      Component                Value               Date                      WBC                      7.4                 10/07/2023                HGB                      11.3 (L)  10/07/2023                HCT                      36.5 (L)            10/07/2023                MCV                      105.8 (H)           10/07/2023                PLT                      161                 10/07/2023              Anesthesia Other Findings All TRamadol   Reproductive/Obstetrics                             Anesthesia Physical Anesthesia Plan  ASA: 3  Anesthesia Plan: General and Regional   Post-op Pain Management: Regional block* and Minimal or no pain anticipated   Induction: Intravenous  PONV Risk Score and Plan: 2 and Propofol infusion, Treatment may vary due to age or medical condition and Ondansetron  Airway Management Planned: Oral ETT  Additional Equipment: None  Intra-op Plan:   Post-operative Plan: Extubation in OR  Informed Consent: I have reviewed the patients History and Physical, chart, labs and discussed the procedure including the risks, benefits and alternatives for the proposed anesthesia with the patient or authorized representative who has indicated his/her understanding and acceptance.     Dental advisory given  Plan Discussed with:   Anesthesia Plan Comments: (See PAT note from 12/4 by Sherlie Ban PA-C  GS w R ISB w exparel )        Anesthesia Quick Evaluation

## 2023-10-16 ENCOUNTER — Other Ambulatory Visit: Payer: Self-pay

## 2023-10-16 ENCOUNTER — Ambulatory Visit (HOSPITAL_COMMUNITY): Payer: Medicare PPO | Admitting: Anesthesiology

## 2023-10-16 ENCOUNTER — Encounter (HOSPITAL_COMMUNITY): Payer: Self-pay | Admitting: Orthopedic Surgery

## 2023-10-16 ENCOUNTER — Observation Stay (HOSPITAL_COMMUNITY): Payer: Medicare PPO

## 2023-10-16 ENCOUNTER — Observation Stay (HOSPITAL_COMMUNITY)
Admission: RE | Admit: 2023-10-16 | Discharge: 2023-10-17 | Disposition: A | Discharge status: Home / Self Care | Payer: Medicare PPO | Attending: Orthopedic Surgery | Admitting: Orthopedic Surgery

## 2023-10-16 ENCOUNTER — Encounter (HOSPITAL_COMMUNITY)
Admission: RE | Disposition: A | Discharge status: Home / Self Care | Payer: Self-pay | Source: Home / Self Care | Attending: Orthopedic Surgery

## 2023-10-16 ENCOUNTER — Ambulatory Visit (HOSPITAL_COMMUNITY): Payer: Medicare PPO | Admitting: Medical

## 2023-10-16 DIAGNOSIS — Z87891 Personal history of nicotine dependence: Secondary | ICD-10-CM | POA: Insufficient documentation

## 2023-10-16 DIAGNOSIS — I5022 Chronic systolic (congestive) heart failure: Secondary | ICD-10-CM | POA: Diagnosis not present

## 2023-10-16 DIAGNOSIS — Z79899 Other long term (current) drug therapy: Secondary | ICD-10-CM | POA: Diagnosis not present

## 2023-10-16 DIAGNOSIS — N1832 Chronic kidney disease, stage 3b: Secondary | ICD-10-CM | POA: Insufficient documentation

## 2023-10-16 DIAGNOSIS — G8918 Other acute postprocedural pain: Secondary | ICD-10-CM | POA: Diagnosis not present

## 2023-10-16 DIAGNOSIS — I2510 Atherosclerotic heart disease of native coronary artery without angina pectoris: Secondary | ICD-10-CM | POA: Insufficient documentation

## 2023-10-16 DIAGNOSIS — I480 Paroxysmal atrial fibrillation: Secondary | ICD-10-CM | POA: Diagnosis not present

## 2023-10-16 DIAGNOSIS — Z96611 Presence of right artificial shoulder joint: Principal | ICD-10-CM

## 2023-10-16 DIAGNOSIS — M75101 Unspecified rotator cuff tear or rupture of right shoulder, not specified as traumatic: Secondary | ICD-10-CM | POA: Diagnosis not present

## 2023-10-16 DIAGNOSIS — M12811 Other specific arthropathies, not elsewhere classified, right shoulder: Secondary | ICD-10-CM

## 2023-10-16 DIAGNOSIS — Z950 Presence of cardiac pacemaker: Secondary | ICD-10-CM | POA: Diagnosis not present

## 2023-10-16 DIAGNOSIS — Z7901 Long term (current) use of anticoagulants: Secondary | ICD-10-CM | POA: Diagnosis not present

## 2023-10-16 DIAGNOSIS — Z951 Presence of aortocoronary bypass graft: Secondary | ICD-10-CM | POA: Diagnosis not present

## 2023-10-16 DIAGNOSIS — Z95810 Presence of automatic (implantable) cardiac defibrillator: Secondary | ICD-10-CM | POA: Insufficient documentation

## 2023-10-16 DIAGNOSIS — I130 Hypertensive heart and chronic kidney disease with heart failure and stage 1 through stage 4 chronic kidney disease, or unspecified chronic kidney disease: Secondary | ICD-10-CM | POA: Diagnosis not present

## 2023-10-16 DIAGNOSIS — J45909 Unspecified asthma, uncomplicated: Secondary | ICD-10-CM | POA: Insufficient documentation

## 2023-10-16 DIAGNOSIS — Z471 Aftercare following joint replacement surgery: Secondary | ICD-10-CM | POA: Diagnosis not present

## 2023-10-16 DIAGNOSIS — N183 Chronic kidney disease, stage 3 unspecified: Secondary | ICD-10-CM | POA: Diagnosis not present

## 2023-10-16 HISTORY — PX: REVERSE SHOULDER ARTHROPLASTY: SHX5054

## 2023-10-16 LAB — GLUCOSE, CAPILLARY
Glucose-Capillary: 139 mg/dL — ABNORMAL HIGH (ref 70–99)
Glucose-Capillary: 69 mg/dL — ABNORMAL LOW (ref 70–99)

## 2023-10-16 SURGERY — ARTHROPLASTY, SHOULDER, TOTAL, REVERSE
Anesthesia: Regional | Site: Shoulder | Laterality: Right

## 2023-10-16 MED ORDER — ACETAMINOPHEN 10 MG/ML IV SOLN
INTRAVENOUS | Status: AC
  Administered 2023-10-16: 1000 mg via INTRAVENOUS
  Filled 2023-10-16: qty 100

## 2023-10-16 MED ORDER — LACTATED RINGERS IV SOLN
INTRAVENOUS | Status: DC
Start: 2023-10-16 — End: 2023-10-16

## 2023-10-16 MED ORDER — CEFAZOLIN SODIUM-DEXTROSE 2-4 GM/100ML-% IV SOLN
2.0000 g | INTRAVENOUS | Status: AC
  Administered 2023-10-16: 2 g via INTRAVENOUS
  Filled 2023-10-16: qty 100

## 2023-10-16 MED ORDER — AMLODIPINE BESYLATE 5 MG PO TABS
5.0000 mg | ORAL_TABLET | Freq: Every day | ORAL | Status: DC
  Administered 2023-10-17: 5 mg via ORAL
  Filled 2023-10-16: qty 1

## 2023-10-16 MED ORDER — BUPIVACAINE-EPINEPHRINE (PF) 0.25% -1:200000 IJ SOLN
INTRAMUSCULAR | Status: DC | PRN
  Administered 2023-10-16: 15 mL

## 2023-10-16 MED ORDER — ONDANSETRON HCL 4 MG/2ML IJ SOLN: 4.0000 mg | Freq: Once | INTRAMUSCULAR | Status: DC | PRN

## 2023-10-16 MED ORDER — TRANEXAMIC ACID-NACL 1000-0.7 MG/100ML-% IV SOLN
1000.0000 mg | INTRAVENOUS | Status: AC
  Administered 2023-10-16: 1000 mg via INTRAVENOUS
  Filled 2023-10-16: qty 100

## 2023-10-16 MED ORDER — HYDROCODONE-ACETAMINOPHEN 5-325 MG PO TABS
1.0000 | ORAL_TABLET | ORAL | Status: DC | PRN
Start: 2023-10-16 — End: 2023-10-17
  Administered 2023-10-17: 2 via ORAL
  Filled 2023-10-16: qty 2

## 2023-10-16 MED ORDER — STERILE WATER FOR IRRIGATION IR SOLN
Status: DC | PRN
  Administered 2023-10-16: 1000 mL

## 2023-10-16 MED ORDER — BUPIVACAINE-EPINEPHRINE (PF) 0.5% -1:200000 IJ SOLN
INTRAMUSCULAR | Status: DC | PRN
  Administered 2023-10-16: 15 mL via PERINEURAL

## 2023-10-16 MED ORDER — HYDRALAZINE HCL 25 MG PO TABS: 25.0000 mg | ORAL_TABLET | Freq: Three times a day (TID) | ORAL | Status: DC | PRN

## 2023-10-16 MED ORDER — CHLORHEXIDINE GLUCONATE 0.12 % MT SOLN
15.0000 mL | Freq: Once | OROMUCOSAL | Status: AC
  Administered 2023-10-16: 15 mL via OROMUCOSAL

## 2023-10-16 MED ORDER — NITROGLYCERIN 0.4 MG SL SUBL: 0.4000 mg | SUBLINGUAL_TABLET | SUBLINGUAL | Status: DC | PRN

## 2023-10-16 MED ORDER — ROCURONIUM BROMIDE 10 MG/ML (PF) SYRINGE
PREFILLED_SYRINGE | INTRAVENOUS | Status: AC
  Filled 2023-10-16: qty 10

## 2023-10-16 MED ORDER — METHOCARBAMOL 1000 MG/10ML IJ SOLN: 500.0000 mg | Freq: Four times a day (QID) | INTRAMUSCULAR | Status: DC | PRN

## 2023-10-16 MED ORDER — TRANEXAMIC ACID-NACL 1000-0.7 MG/100ML-% IV SOLN
1000.0000 mg | Freq: Once | INTRAVENOUS | Status: AC
  Administered 2023-10-16: 1000 mg via INTRAVENOUS
  Filled 2023-10-16: qty 100

## 2023-10-16 MED ORDER — FENTANYL CITRATE PF 50 MCG/ML IJ SOSY: 25.0000 ug | PREFILLED_SYRINGE | INTRAMUSCULAR | Status: DC | PRN

## 2023-10-16 MED ORDER — APIXABAN 2.5 MG PO TABS
2.5000 mg | ORAL_TABLET | Freq: Two times a day (BID) | ORAL | Status: DC
Start: 2023-10-17 — End: 2023-10-17
  Administered 2023-10-17: 2.5 mg via ORAL
  Filled 2023-10-16: qty 1

## 2023-10-16 MED ORDER — FAMOTIDINE 20 MG PO TABS
20.0000 mg | ORAL_TABLET | Freq: Every day | ORAL | Status: DC
  Administered 2023-10-17: 20 mg via ORAL
  Filled 2023-10-16: qty 1

## 2023-10-16 MED ORDER — MENTHOL 3 MG MT LOZG: 1.0000 | LOZENGE | OROMUCOSAL | Status: DC | PRN

## 2023-10-16 MED ORDER — LIDOCAINE HCL (CARDIAC) PF 100 MG/5ML IV SOSY
PREFILLED_SYRINGE | INTRAVENOUS | Status: DC | PRN
  Administered 2023-10-16: 100 mg via INTRAVENOUS

## 2023-10-16 MED ORDER — CEFAZOLIN SODIUM-DEXTROSE 2-4 GM/100ML-% IV SOLN
2.0000 g | Freq: Four times a day (QID) | INTRAVENOUS | Status: AC
  Administered 2023-10-16 (×2): 2 g via INTRAVENOUS
  Filled 2023-10-16 (×2): qty 100

## 2023-10-16 MED ORDER — POLYETHYLENE GLYCOL 3350 17 G PO PACK: 17.0000 g | PACK | Freq: Every day | ORAL | Status: DC | PRN

## 2023-10-16 MED ORDER — PHENOL 1.4 % MT LIQD: 1.0000 | OROMUCOSAL | Status: DC | PRN

## 2023-10-16 MED ORDER — ONDANSETRON HCL 4 MG/2ML IJ SOLN: 4.0000 mg | Freq: Four times a day (QID) | INTRAMUSCULAR | Status: DC | PRN

## 2023-10-16 MED ORDER — ROSUVASTATIN CALCIUM 20 MG PO TABS
40.0000 mg | ORAL_TABLET | Freq: Every day | ORAL | Status: DC
  Administered 2023-10-17: 40 mg via ORAL
  Filled 2023-10-16: qty 2

## 2023-10-16 MED ORDER — ONDANSETRON HCL 4 MG PO TABS: 4.0000 mg | ORAL_TABLET | Freq: Four times a day (QID) | ORAL | Status: DC | PRN

## 2023-10-16 MED ORDER — PHENYLEPHRINE HCL-NACL 20-0.9 MG/250ML-% IV SOLN
INTRAVENOUS | Status: DC | PRN
  Administered 2023-10-16: 40 ug/min via INTRAVENOUS

## 2023-10-16 MED ORDER — MORPHINE SULFATE (PF) 2 MG/ML IV SOLN: 0.5000 mg | INTRAVENOUS | Status: DC | PRN

## 2023-10-16 MED ORDER — PROPOFOL 10 MG/ML IV BOLUS
INTRAVENOUS | Status: AC
  Filled 2023-10-16: qty 20

## 2023-10-16 MED ORDER — METOCLOPRAMIDE HCL 5 MG/ML IJ SOLN: 5.0000 mg | Freq: Three times a day (TID) | INTRAMUSCULAR | Status: DC | PRN

## 2023-10-16 MED ORDER — ROCURONIUM BROMIDE 100 MG/10ML IV SOLN
INTRAVENOUS | Status: DC | PRN
  Administered 2023-10-16: 40 mg via INTRAVENOUS

## 2023-10-16 MED ORDER — PROPOFOL 10 MG/ML IV BOLUS
INTRAVENOUS | Status: DC | PRN
  Administered 2023-10-16: 110 mg via INTRAVENOUS

## 2023-10-16 MED ORDER — SODIUM CHLORIDE 0.9 % IV SOLN: INTRAVENOUS | Status: DC

## 2023-10-16 MED ORDER — ONDANSETRON HCL 4 MG/2ML IJ SOLN
INTRAMUSCULAR | Status: DC | PRN
  Administered 2023-10-16: 4 mg via INTRAVENOUS

## 2023-10-16 MED ORDER — 0.9 % SODIUM CHLORIDE (POUR BTL) OPTIME
TOPICAL | Status: DC | PRN
  Administered 2023-10-16: 1000 mL

## 2023-10-16 MED ORDER — METOCLOPRAMIDE HCL 5 MG PO TABS: 5.0000 mg | ORAL_TABLET | Freq: Three times a day (TID) | ORAL | Status: DC | PRN

## 2023-10-16 MED ORDER — ORAL CARE MOUTH RINSE: 15.0000 mL | Freq: Once | OROMUCOSAL | Status: AC

## 2023-10-16 MED ORDER — METHOCARBAMOL 500 MG PO TABS: 500.0000 mg | ORAL_TABLET | Freq: Four times a day (QID) | ORAL | Status: DC | PRN

## 2023-10-16 MED ORDER — GABAPENTIN 400 MG PO CAPS
1200.0000 mg | ORAL_CAPSULE | Freq: Every day | ORAL | Status: DC
  Administered 2023-10-16: 1200 mg via ORAL
  Filled 2023-10-16: qty 3

## 2023-10-16 MED ORDER — TRAZODONE HCL 50 MG PO TABS
50.0000 mg | ORAL_TABLET | Freq: Every day | ORAL | Status: DC
  Administered 2023-10-16: 50 mg via ORAL
  Filled 2023-10-16: qty 1

## 2023-10-16 MED ORDER — SACUBITRIL-VALSARTAN 24-26 MG PO TABS
1.0000 | ORAL_TABLET | Freq: Two times a day (BID) | ORAL | Status: DC
  Administered 2023-10-16 – 2023-10-17 (×2): 1 via ORAL
  Filled 2023-10-16 (×3): qty 1

## 2023-10-16 MED ORDER — DOCUSATE SODIUM 100 MG PO CAPS
100.0000 mg | ORAL_CAPSULE | Freq: Two times a day (BID) | ORAL | Status: DC
  Administered 2023-10-16 – 2023-10-17 (×2): 100 mg via ORAL
  Filled 2023-10-16 (×2): qty 1

## 2023-10-16 MED ORDER — HYDROCODONE-ACETAMINOPHEN 5-325 MG PO TABS: 1.0000 | ORAL_TABLET | Freq: Four times a day (QID) | ORAL | 0 refills | Status: DC | PRN

## 2023-10-16 MED ORDER — BUPIVACAINE-EPINEPHRINE 0.25% -1:200000 IJ SOLN
INTRAMUSCULAR | Status: AC
  Filled 2023-10-16: qty 1

## 2023-10-16 MED ORDER — FENTANYL CITRATE PF 50 MCG/ML IJ SOSY: 100.0000 ug | PREFILLED_SYRINGE | Freq: Once | INTRAMUSCULAR | Status: AC

## 2023-10-16 MED ORDER — FENTANYL CITRATE PF 50 MCG/ML IJ SOSY
PREFILLED_SYRINGE | INTRAMUSCULAR | Status: AC
  Administered 2023-10-16: 25 ug via INTRAVENOUS
  Filled 2023-10-16: qty 1

## 2023-10-16 MED ORDER — ACETAMINOPHEN 10 MG/ML IV SOLN: 1000.0000 mg | Freq: Once | INTRAVENOUS | Status: DC | PRN

## 2023-10-16 MED ORDER — DEXAMETHASONE SODIUM PHOSPHATE 10 MG/ML IJ SOLN
INTRAMUSCULAR | Status: DC | PRN
  Administered 2023-10-16: 10 mg via INTRAVENOUS

## 2023-10-16 MED ORDER — BUPROPION HCL ER (SR) 150 MG PO TB12
300.0000 mg | ORAL_TABLET | Freq: Every day | ORAL | Status: DC
  Administered 2023-10-17: 300 mg via ORAL
  Filled 2023-10-16: qty 2

## 2023-10-16 MED ORDER — ESCITALOPRAM OXALATE 10 MG PO TABS
10.0000 mg | ORAL_TABLET | Freq: Every day | ORAL | Status: DC
  Administered 2023-10-17: 10 mg via ORAL
  Filled 2023-10-16: qty 1

## 2023-10-16 MED ORDER — MIDAZOLAM HCL 2 MG/2ML IJ SOLN: 1.0000 mg | Freq: Once | INTRAMUSCULAR | Status: DC

## 2023-10-16 MED ORDER — BUPIVACAINE LIPOSOME 1.3 % IJ SUSP
INTRAMUSCULAR | Status: DC | PRN
  Administered 2023-10-16: 10 mL via PERINEURAL

## 2023-10-16 MED ORDER — SODIUM CHLORIDE 0.9% FLUSH
10.0000 mL | Freq: Two times a day (BID) | INTRAVENOUS | Status: DC
  Administered 2023-10-16 – 2023-10-17 (×2): 10 mL via INTRAVENOUS

## 2023-10-16 MED ORDER — SUGAMMADEX SODIUM 200 MG/2ML IV SOLN
INTRAVENOUS | Status: DC | PRN
  Administered 2023-10-16: 200 mg via INTRAVENOUS

## 2023-10-16 MED ORDER — ACETAMINOPHEN 325 MG PO TABS
325.0000 mg | ORAL_TABLET | Freq: Four times a day (QID) | ORAL | Status: DC | PRN
Start: 2023-10-17 — End: 2023-10-17

## 2023-10-16 MED ORDER — LIDOCAINE HCL (PF) 2 % IJ SOLN
INTRAMUSCULAR | Status: AC
  Filled 2023-10-16: qty 5

## 2023-10-16 SURGICAL SUPPLY — 62 items
BAG COUNTER SPONGE SURGICOUNT (BAG) IMPLANT
BAG ZIPLOCK 12X15 (MISCELLANEOUS) IMPLANT
BIT DRILL 1.6MX128 (BIT) IMPLANT
BIT DRILL 170X2.5X (BIT) IMPLANT
BIT DRL 170X2.5X (BIT) ×1
BLADE SAG 18X100X1.27 (BLADE) ×1 IMPLANT
CLSR STERI-STRIP ANTIMIC 1/2X4 (GAUZE/BANDAGES/DRESSINGS) IMPLANT
COVER BACK TABLE 60X90IN (DRAPES) ×1 IMPLANT
COVER SURGICAL LIGHT HANDLE (MISCELLANEOUS) ×1 IMPLANT
DRAPE INCISE IOBAN 66X45 STRL (DRAPES) ×1 IMPLANT
DRAPE SHEET LG 3/4 BI-LAMINATE (DRAPES) ×1 IMPLANT
DRAPE SURG ORHT 6 SPLT 77X108 (DRAPES) ×2 IMPLANT
DRAPE TOP 10253 STERILE (DRAPES) ×1 IMPLANT
DRAPE U-SHAPE 47X51 STRL (DRAPES) ×1 IMPLANT
DRSG ADAPTIC 3X8 NADH LF (GAUZE/BANDAGES/DRESSINGS) ×1 IMPLANT
DRSG EMULSION OIL 3X16 NADH (GAUZE/BANDAGES/DRESSINGS) IMPLANT
DURAPREP 26ML APPLICATOR (WOUND CARE) ×1 IMPLANT
ELECT BLADE TIP CTD 4 INCH (ELECTRODE) ×1 IMPLANT
ELECT NDL TIP 2.8 STRL (NEEDLE) ×1 IMPLANT
ELECT NEEDLE TIP 2.8 STRL (NEEDLE) ×1
ELECT REM PT RETURN 15FT ADLT (MISCELLANEOUS) ×1 IMPLANT
EPIPHYSI RIGHT SZ 2 (Shoulder) ×1 IMPLANT
EPIPHYSIS RIGHT SZ 2 (Shoulder) IMPLANT
FACESHIELD WRAPAROUND (MASK) ×1
FACESHIELD WRAPAROUND OR TEAM (MASK) ×1 IMPLANT
GAUZE PAD ABD 8X10 STRL (GAUZE/BANDAGES/DRESSINGS) ×1 IMPLANT
GAUZE SPONGE 4X4 12PLY STRL (GAUZE/BANDAGES/DRESSINGS) ×1 IMPLANT
GLENOSPHERE XTEND LAT 42+0 STD (Miscellaneous) IMPLANT
GLOVE BIOGEL PI IND STRL 7.5 (GLOVE) ×1 IMPLANT
GLOVE BIOGEL PI IND STRL 8.5 (GLOVE) ×1 IMPLANT
GLOVE ORTHO TXT STRL SZ7.5 (GLOVE) ×1 IMPLANT
GLOVE SURG ORTHO 8.5 STRL (GLOVE) ×1 IMPLANT
GOWN STRL REUS W/ TWL XL LVL3 (GOWN DISPOSABLE) ×2 IMPLANT
KIT BASIN OR (CUSTOM PROCEDURE TRAY) ×1 IMPLANT
KIT TURNOVER KIT A (KITS) IMPLANT
MANIFOLD NEPTUNE II (INSTRUMENTS) ×1 IMPLANT
METAGLENE DELTA EXTEND (Trauma) IMPLANT
METAGLENE DXTEND (Trauma) ×1 IMPLANT
NDL MAYO CATGUT SZ4 TPR NDL (NEEDLE) IMPLANT
NEEDLE MAYO CATGUT SZ4 (NEEDLE)
NS IRRIG 1000ML POUR BTL (IV SOLUTION) ×1 IMPLANT
PACK SHOULDER (CUSTOM PROCEDURE TRAY) ×1 IMPLANT
PIN GUIDE 1.2 (PIN) IMPLANT
PIN GUIDE GLENOPHERE 1.5MX300M (PIN) IMPLANT
PIN METAGLENE 2.5 (PIN) IMPLANT
RESTRAINT HEAD UNIVERSAL NS (MISCELLANEOUS) ×1 IMPLANT
SCREW 4.5X36MM (Screw) IMPLANT
SCREW 48L (Screw) IMPLANT
SCREW BN 18X4.5XSTRL SHLDR (Screw) IMPLANT
SLING ARM FOAM STRAP LRG (SOFTGOODS) IMPLANT
SPACER 42 PLUS 6 (Orthopedic Implant) IMPLANT
SPIKE FLUID TRANSFER (MISCELLANEOUS) ×1 IMPLANT
SPONGE T-LAP 4X18 ~~LOC~~+RFID (SPONGE) IMPLANT
STEM 12 HA (Stem) IMPLANT
STRIP CLOSURE SKIN 1/2X4 (GAUZE/BANDAGES/DRESSINGS) ×1 IMPLANT
SUT FIBERWIRE #2 38 T-5 BLUE (SUTURE) ×1
SUT MNCRL AB 4-0 PS2 18 (SUTURE) ×1 IMPLANT
SUT VIC AB 0 CT1 36 (SUTURE) ×1 IMPLANT
SUT VIC AB 0 CT2 27 (SUTURE) ×1 IMPLANT
SUT VIC AB 2-0 CT1 TAPERPNT 27 (SUTURE) ×1 IMPLANT
SUTURE FIBERWR #2 38 T-5 BLUE (SUTURE) ×1 IMPLANT
TOWEL OR 17X26 10 PK STRL BLUE (TOWEL DISPOSABLE) ×1 IMPLANT

## 2023-10-16 NOTE — Anesthesia Procedure Notes (Signed)
Procedure Name: Intubation Date/Time: 10/16/2023 9:46 AM  Performed by: Nathen May, CRNAPre-anesthesia Checklist: Patient identified, Emergency Drugs available, Suction available and Patient being monitored Patient Re-evaluated:Patient Re-evaluated prior to induction Oxygen Delivery Method: Circle System Utilized Preoxygenation: Pre-oxygenation with 100% oxygen Induction Type: IV induction Ventilation: Mask ventilation without difficulty Laryngoscope Size: Mac and 3 Grade View: Grade I Tube type: Oral Tube size: 7.5 mm Number of attempts: 1 Airway Equipment and Method: Stylet and Oral airway Placement Confirmation: ETT inserted through vocal cords under direct vision, positive ETCO2 and breath sounds checked- equal and bilateral Secured at: 23 cm Tube secured with: Tape Dental Injury: Teeth and Oropharynx as per pre-operative assessment

## 2023-10-16 NOTE — Anesthesia Postprocedure Evaluation (Signed)
Anesthesia Post Note  Patient: Connor Wilkerson  Procedure(s) Performed: REVERSE SHOULDER ARTHROPLASTY (Right: Shoulder)     Patient location during evaluation: PACU Anesthesia Type: Regional and General Level of consciousness: awake and alert Pain management: pain level controlled Vital Signs Assessment: post-procedure vital signs reviewed and stable Respiratory status: spontaneous breathing, nonlabored ventilation, respiratory function stable and patient connected to nasal cannula oxygen Cardiovascular status: blood pressure returned to baseline and stable Postop Assessment: no apparent nausea or vomiting Anesthetic complications: no   No notable events documented.  Last Vitals:  Vitals:   10/16/23 1300 10/16/23 1328  BP: 117/61 126/63  Pulse: 60 60  Resp: 16 16  Temp:  36.7 C  SpO2: 96% 94%    Last Pain:  Vitals:   10/16/23 1328  TempSrc: Oral  PainSc:                  Trevor Iha

## 2023-10-16 NOTE — Evaluation (Signed)
Occupational Therapy Evaluation Patient Details Name: Connor Wilkerson MRN: [id number] DOB: 21-Apr-1941 Today's Date: 10/16/2023   History of Present Illness Pt is a 82 y.o. male s/p R reverse shoulder arthroplasty on 10/16/23. PMH significant for CHF, CKD, HTN, pacemaker, ICD, L elbow surgery, cardiac cath, L toe amputation   Clinical Impression   Prior to hospital admission, pt was modified independent with ADLs/mobility with SPC/hx of 2 falls in the past 6 months. Unsure of accuracy provided as family not present to verify PLOF/home setup/cognitive status. Pt is primary caregiver to his spouse, who is currently in a wheelchair. Pt frequently picks up his wife to help transfer her, and tells this Thereasa Parkin he will continue to do so when he returns home this weekend. Pt states there are no other friends/family nearby to assist him or spouse. Note in chart from [facility] states that pt's wife has someone to stay with her, but pt needs to be "as independent as possible with mobility".   Pt oriented to self, DOB, states it is 1936, aware of location/situation. OT provided education regarding shoulder precautions, sling management and compensatory strategies for ADLs. Pt with poor carryover of taught education despite multiple attempts and handout provided. Pt unable to recall sling wear schedule despite max cuing, and is unable to return demonstrate taught ADL strategies for UB. Additionally, pt unable to problem-solve when provided examples and functional situations regarding his shoulder. Stands impulsively from EOB to don underwear, unsteady but corrects self with CGA at trunk for balance with OT supporting RUE. MaxA to don sling and hospital gown. Pt is currently unsafe to discharge home at this time due to impairments of dominant RUE which impact safe ADL performance, and impaired cognitive status. Will require additional OT intervention to adhere to shoulder precautions and sling  wearing schedule. Recommending 24/7 support and assist upon hospital discharge. Pt may need short-term rehabilitation for ADL assist if he does not progress. Pt would additionally benefit from PT consult to assess for balance deficits. Updated RN on pt's current status, need for increased supervision, and decreased safety awareness.       If plan is discharge home, recommend the following: A little help with walking and/or transfers;A lot of help with bathing/dressing/bathroom;Assistance with cooking/housework;Direct supervision/assist for medications management;Direct supervision/assist for financial management;Assist for transportation;Help with stairs or ramp for entrance;Supervision due to cognitive status    Functional Status Assessment  Patient has had a recent decline in their functional status and demonstrates the ability to make significant improvements in function in a reasonable and predictable amount of time.  Equipment Recommendations  None recommended by OT (pt states he has necessary DME)    Recommendations for Other Services PT consult     Precautions / Restrictions Precautions Precautions: Shoulder Type of Shoulder Precautions: NWB (Sling at all times except ADL/exercise; Non weight bearing; AROM elbow, wrist and hand to tolerance) Shoulder Interventions: Shoulder sling/immobilizer;Off for dressing/bathing/exercises Precaution Booklet Issued: Yes (comment) Precaution Comments: ok for gentle hand to face ADLs and may use for  balance during mobilization Required Braces or Orthoses: Sling Restrictions Weight Bearing Restrictions Per Provider Order: Yes RUE Weight Bearing Per Provider Order: Non weight bearing (Per OT order "ok for gentle hand to face ADLs and may use for balance during mobilization")      Mobility Bed Mobility Overal bed mobility: Needs Assistance Bed Mobility: Supine to Sit, Sit to Supine     Supine to sit: Min assist Sit to supine: Min assist  General bed mobility comments: cues to avoid pushing up from RUE immobilized in sling    Transfers Overall transfer level: Needs assistance Equipment used: None Transfers: Sit to/from Stand Sit to Stand: Contact guard assist           General transfer comment: pt impulsively stands from EOB, OT managing surgical arm and cuing to redirect pt.      Balance Overall balance assessment: History of Falls, Mild deficits observed, not formally tested                                         ADL either performed or assessed with clinical judgement   ADL Overall ADL's : Needs assistance/impaired Eating/Feeding: Set up;Sitting   Grooming: Wash/dry hands;Wash/dry face;Sitting   Upper Body Bathing: Maximal assistance;Sitting;Cueing for UE precautions   Lower Body Bathing: Maximal assistance;Sitting/lateral leans;Sit to/from stand;Cueing for safety;Cueing for sequencing   Upper Body Dressing : Maximal assistance;Adhering to UE precautions;Sitting   Lower Body Dressing: Sit to/from stand;Cueing for sequencing;Cueing for safety;Maximal assistance Lower Body Dressing Details (indicate cue type and reason): dons underwear, unsteady in standing, no awareness of RUE Toilet Transfer: Minimal assistance;Ambulation;BSC/3in1;Regular Toilet;Cueing for sequencing;Cueing for safety   Toileting- Clothing Manipulation and Hygiene: Maximal assistance;Sit to/from stand;Sitting/lateral lean       Functional mobility during ADLs: Contact guard assist General ADL Comments: Pt greatly limited by poor STM, impulsivity, decreased awareness of deficits and safety     Vision Ability to See in Adequate Light: 0 Adequate Patient Visual Report: No change from baseline              Pertinent Vitals/Pain Pain Assessment Pain Assessment: No/denies pain Facial Expression: Relaxed, neutral     Extremity/Trunk Assessment Upper Extremity Assessment Upper Extremity Assessment: Right hand  dominant;RUE deficits/detail RUE: Unable to fully assess due to immobilization   Lower Extremity Assessment Lower Extremity Assessment: Generalized weakness   Cervical / Trunk Assessment Cervical / Trunk Assessment: Normal   Communication Communication Communication: Difficulty following commands/understanding Following commands: Follows one step commands inconsistently   Cognition Arousal: Alert Behavior During Therapy: Impulsive, Flat affect Overall Cognitive Status: No family/caregiver present to determine baseline cognitive functioning Area of Impairment: Orientation, Attention, Memory, Following commands, Safety/judgement, Problem solving, Awareness                 Orientation Level: Time   Memory: Decreased recall of precautions, Decreased short-term memory Following Commands: Follows one step commands inconsistently Safety/Judgement: Decreased awareness of deficits, Decreased awareness of safety Awareness: Intellectual Problem Solving: Slow processing, Requires verbal cues, Requires tactile cues General Comments: Pt alert to name, DOB, tells this author year is 74, oriented to location + situation        Exercises Exercises: Shoulder   Shoulder Instructions Shoulder Instructions Donning/doffing shirt without moving shoulder: Maximal assistance Method for sponge bathing under operated UE: Maximal assistance Donning/doffing sling/immobilizer: Maximal assistance Correct positioning of sling/immobilizer: Maximal assistance Positioning of UE while sleeping: Maximal assistance    Home Living Family/patient expects to be discharged to:: Private residence Living Arrangements: Spouse/significant other Available Help at Discharge: Available 24 hours/day;Family Type of Home: House Home Access: Level entry     Home Layout: One level     Bathroom Shower/Tub: Producer, television/film/video: Standard     Home Equipment: Cane - single point;BSC/3in1;Shower  seat;Grab bars - toilet;Grab bars - tub/shower;Hand held shower head  Additional Comments: unsure of home setup accuracy due to cognition      Prior Functioning/Environment Prior Level of Function : Driving;Independent/Modified Independent             Mobility Comments: pt states he occassionally used SPC for mobility, 2 falls in the last 6 months (legs give out) ADLs Comments: indepedent        OT Problem List: Decreased strength;Decreased range of motion;Decreased activity tolerance;Impaired balance (sitting and/or standing);Decreased safety awareness;Decreased cognition;Decreased knowledge of use of DME or AE;Decreased knowledge of precautions;Pain;Impaired UE functional use      OT Treatment/Interventions: Self-care/ADL training;DME and/or AE instruction;Cognitive remediation/compensation;Patient/family education;Balance training;Therapeutic activities    OT Goals(Current goals can be found in the care plan section) Acute Rehab OT Goals OT Goal Formulation: Patient unable to participate in goal setting Time For Goal Achievement: 10/30/23 Potential to Achieve Goals: Fair  OT Frequency: Min 1X/week       AM-PAC OT "6 Clicks" Daily Activity     Outcome Measure Help from another person eating meals?: A Little Help from another person taking care of personal grooming?: A Little Help from another person toileting, which includes using toliet, bedpan, or urinal?: Total Help from another person bathing (including washing, rinsing, drying)?: Total Help from another person to put on and taking off regular upper body clothing?: Total Help from another person to put on and taking off regular lower body clothing?: Total 6 Click Score: 10   End of Session Equipment Utilized During Treatment: Other (comment) (sling) Nurse Communication: Mobility status;Weight bearing status;Precautions;Other (comment) (updated RN on pt's impulsivity, need for increased supervision/cuing to adhere to  shoulder precautions, and pt's poor recall of education provided)  Activity Tolerance: Patient tolerated treatment well Patient left: in bed;with call bell/phone within reach;with nursing/sitter in room;with SCD's reapplied  OT Visit Diagnosis: Other abnormalities of gait and mobility (R26.89);Muscle weakness (generalized) (M62.81);Other (comment);History of falling (Z91.81) (impaired dominant RUE use)                Time: 4034-7425 OT Time Calculation (min): 27 min Charges:  OT General Charges $OT Visit: 1 Visit OT Evaluation $OT Eval Moderate Complexity: 1 Mod OT Treatments $Self Care/Home Management : 8-22 mins  Tasia Liz L. Jiovany Scheffel, OTR/L  10/16/23, 4:32 PM

## 2023-10-16 NOTE — Op Note (Signed)
NAME: Connor Wilkerson, [facility] MEDICAL RECORD NO: [id number] ACCOUNT NO: [id number] DATE OF BIRTH: [birth year] FACILITY: [facility] LOCATION: [facility] PHYSICIAN: Almedia Balls. Ranell Patrick, MD  Operative Report   DATE OF PROCEDURE: 10/16/2023  PREOPERATIVE DIAGNOSIS:  Right shoulder rotator cuff tear arthropathy, end-stage.  POSTOPERATIVE DIAGNOSIS:  Right shoulder rotator cuff tear arthropathy, end-stage.  PROCEDURE PERFORMED:  Right reverse total shoulder arthroplasty using DePuy Delta Xtend prosthesis with no subscap repair.  ATTENDING SURGEON:  Almedia Balls. Ranell Patrick, MD   ASSISTANT:  Konrad Felix Dixon, [place], who was scrubbed during the entire procedure, and necessary for satisfactory completion of surgery.  ANESTHESIA:  General anesthesia was used plus interscalene block.  ESTIMATED BLOOD LOSS:  200 mL.  FLUID REPLACEMENT:  1500 mL of crystalloid.  COUNTS:  Instrument counts were correct.  COMPLICATIONS:  There were no complications.  ANTIBIOTICS:  Perioperative antibiotics were given.  INDICATIONS:  The patient is an 82 year old male who presents with worsening right shoulder pain and dysfunction secondary to end-stage rotator cuff tear arthropathy.  The patient has superior head migration in anterior superior escape.  The patient has  lost fixed fulcrum mechanics to the shoulder and thus has poor shoulder function interfering with ADLs.  The patient has also significant pain including rest pain and night pain.  The patient presents now for operative reverse total shoulder arthroplasty  to eliminate pain and restore function.  Informed consent obtained.  DESCRIPTION OF PROCEDURE:  After an adequate level of anesthesia was achieved, the patient was positioned in the modified beach chair position.  Right shoulder was correctly identified and sterile prep and drape performed.  Timeout called verifying  correct patient and correct site.  We entered the patient's shoulder using a standard deltopectoral  incision starting at the coracoid process and extending down the anterior humerus.  Dissection down through subcutaneous tissues.  The cephalic vein was  identified and taken laterally with the deltoid.  Pectoralis was taken medially.  Conjoined tendon retracted medially.  Deep retractor was placed.  Biceps tenodesis in situ with 0 Vicryl figure-of-eight suture x2 incorporating part of the pectoralis  tendon.  We also released the subscapularis remnant off the lesser tuberosity intact for protection of the axillary nerve.  This was not repairable.  We released the inferior capsule extending the shoulder and delivering the humeral head out of the  wound.  We entered the proximal humerus, which was devoid of cartilage with a 6-mm reamer reaming up to a size 12.  We placed our 12-mm T-handle guide and resected the head at 20 degrees of retroversion with the oscillating saw.  We removed excess  osteophytes off the medial neck and head using the rongeur.  We then subluxed the humerus posteriorly, gaining good exposure of the glenoid.  We removed the capsule, labrum, gaining excellent exposure.  We placed our deep retractors, identified the  starting point for my guide pin, and placed the guide pin bicortically centered low on the glenoid and inferiorly angled slightly.  We reamed to subchondral bone for the metaglene.  We then did peripheral hand reaming and drilled out our central peg  hole.  We impacted the HA coated Press-Fit baseplate in position.  We placed a 48 screw inferiorly at the base of the scapular neck and then we went with a 36 screw at the base of the coracoid superiorly locking both inferior and superior screws.  We  then used an 18 nonlocked anteriorly with good purchase.  We secured a 42+0 standard glenosphere onto  the baseplate with a screwdriver.  I did a finger sweep to make sure we had no soft tissue cut up between the baseplate and the glenosphere.  I then  went to the humeral side, reamed  for the two right metaphysis.  We trialled with the 12 stem and the two right metaphysis set in the 0 setting and placed in 20 degrees of retroversion.  We used a 42+3 poly trial, placed that on the humeral tray, reduced  the shoulder.  We were pleased with our soft tissue balancing and felt like we could probably get a +6 in.  We removed the trial components, irrigated thoroughly and then using HA-coated press-fit 12 stem and two right metastasis set in the 0 setting and  available bone graft in the humeral head with impaction grafting technique.  We impacted the stem into place.  We had a very secure stem.  We used a 42+6 poly, impacted down the humeral tray and reduced the shoulder.  Appropriate tension with good  conjoined tensioning.  No gapping with inferior pole and very minimal gapping with external rotation.  Irrigated thoroughly, resected the subscapularis remnant.  We then closed the deltopectoral interval with 0 Vicryl suture followed by 2-0 Vicryl for  subcutaneous closure and 4-0 running Monocryl for skin.  Steri-Strips were applied followed by a sterile dressing.  The patient tolerated the surgery well.   PUS D: 10/16/2023 11:45:45 am T: 10/16/2023 12:04:00 pm  JOB: [id number]/ [id number]

## 2023-10-16 NOTE — Anesthesia Procedure Notes (Addendum)
Anesthesia Regional Block: Interscalene brachial plexus block   Pre-Anesthetic Checklist: , timeout performed,  Correct Patient, Correct Site, Correct Laterality,  Correct Procedure, Correct Position, site marked,  Risks and benefits discussed,  Surgical consent,  Pre-op evaluation,  At surgeon's request and post-op pain management  Laterality: Upper and Right  Prep: Maximum Sterile Barrier Precautions used, chloraprep       Needles:  Injection technique: Single-shot  Needle Type: Echogenic Needle     Needle Length: 5cm  Needle Gauge: 21     Additional Needles:   Procedures:,,,, ultrasound used (permanent image in chart),,    Narrative:  Start time: 10/16/2023 8:25 AM End time: 10/16/2023 8:30 AM Injection made incrementally with aspirations every 5 mL.  Performed by: Personally  Anesthesiologist: Trevor Iha, MD  Additional Notes: Block assessed prior to procedure. Patient tolerated procedure well.

## 2023-10-16 NOTE — Discharge Instructions (Signed)
Ice to the shoulder constantly.  Keep the incision covered and clean and dry for one week, then ok to get it wet in the shower. Please change your bandage to the Aquacel bandage on Monday and leave on until Friday, then remove it and leave open to air.   Do exercise as instructed several times per day.  DO NOT reach behind your back or push up out of a chair with the operative arm.  Use a sling while you are up and around for comfort, may remove while seated.  Keep pillow propped behind the operative elbow.  Follow up with Dr Ranell Patrick in two weeks in the office, call [phone] for appt  Please call Dr Ranell Patrick (cell) [phone] with any questions or concerns

## 2023-10-16 NOTE — Care Plan (Signed)
Ortho Bundle Case Management Note  Patient Details  Name: Connor Wilkerson MRN: [id number] Date of Birth: 1941-05-03                  R Rev TSA on 10-16-23  DCP: Home with wife who is w/c bound d/t foot fx. She has arranged someone to stay with her during his recovery, but he will need to be as independent as possible with mobility. PT: HEP   DME Arranged:  N/A DME Agency:       Additional Comments: Please contact me with any questions of if this plan should need to change.   Ennis Forts, RN,CCM [facility]  [phone] 10/16/2023, 8:27 AM

## 2023-10-16 NOTE — Interval H&P Note (Signed)
History and Physical Interval Note:  10/16/2023 9:11 AM  Connor Wilkerson  has presented today for surgery, with the diagnosis of Right shoulder rotator cuff arthropathy.  The various methods of treatment have been discussed with the patient and family. After consideration of risks, benefits and other options for treatment, the patient has consented to  Procedure(s) with comments: REVERSE SHOULDER ARTHROPLASTY (Right) - 100 as a surgical intervention.  The patient's history has been reviewed, patient examined, no change in status, stable for surgery.  I have reviewed the patient's chart and labs.  Questions were answered to the patient's satisfaction.     [name]

## 2023-10-16 NOTE — Transfer of Care (Signed)
Immediate Anesthesia Transfer of Care Note  Patient: Connor Wilkerson  Procedure(s) Performed: REVERSE SHOULDER ARTHROPLASTY (Right: Shoulder)  Patient Location: PACU  Anesthesia Type:General and Regional  Level of Consciousness: awake and alert   Airway & Oxygen Therapy: Patient Spontanous Breathing and Patient connected to face mask oxygen  Post-op Assessment: Report given to RN and Post -op Vital signs reviewed and stable  Post vital signs: Reviewed and stable  Last Vitals:  Vitals Value Taken Time  BP    Temp    Pulse 59 10/16/23 1133  Resp 22 10/16/23 1132  SpO2 100 % 10/16/23 1133  Vitals shown include unfiled device data.  Last Pain:  Vitals:   10/16/23 0840  TempSrc:   PainSc: 0-No pain         Complications: No notable events documented.

## 2023-10-16 NOTE — Plan of Care (Signed)
  Problem: Education: Goal: Knowledge of General Education information will improve Description: Including pain rating scale, medication(s)/side effects and non-pharmacologic comfort measures Outcome: Progressing   Problem: Clinical Measurements: Goal: Ability to maintain clinical measurements within normal limits will improve Outcome: Progressing   Problem: Activity: Goal: Risk for activity intolerance will decrease Outcome: Progressing   Problem: Pain Management: Goal: General experience of comfort will improve Outcome: Progressing   Problem: Safety: Goal: Ability to remain free from injury will improve Outcome: Progressing

## 2023-10-16 NOTE — Brief Op Note (Signed)
10/16/2023  11:29 AM  PATIENT:  Connor Wilkerson  82 y.o. male  PRE-OPERATIVE DIAGNOSIS:  Right shoulder rotator cuff arthropathy, end stage  POST-OPERATIVE DIAGNOSIS:  Right shoulder rotator cuff arthropathy, end stage  PROCEDURE:  Procedure(s) with comments: REVERSE SHOULDER ARTHROPLASTY (Right) - 100 DePuy Delta Xtend with NO subscap repair  SURGEON:  Surgeons and Role:    Beverely Low, MD - Primary  PHYSICIAN ASSISTANT:   ASSISTANTS: Thea Gist, PA-C    ANESTHESIA:   regional and general  EBL:  200 mL   BLOOD ADMINISTERED:none  DRAINS: none   LOCAL MEDICATIONS USED:  MARCAINE     SPECIMEN:  No Specimen  DISPOSITION OF SPECIMEN:  N/A  COUNTS:  YES  TOURNIQUET:  * No tourniquets in log *  DICTATION: .Other Dictation: Dictation Number [id number]  PLAN OF CARE: Discharge to home after PACU  PATIENT DISPOSITION:  PACU - hemodynamically stable.   Delay start of Pharmacological VTE agent (>24hrs) due to surgical blood loss or risk of bleeding: not applicable

## 2023-10-17 DIAGNOSIS — G8918 Other acute postprocedural pain: Secondary | ICD-10-CM | POA: Diagnosis not present

## 2023-10-17 DIAGNOSIS — M25519 Pain in unspecified shoulder: Secondary | ICD-10-CM | POA: Diagnosis not present

## 2023-10-17 DIAGNOSIS — M25511 Pain in right shoulder: Secondary | ICD-10-CM | POA: Diagnosis not present

## 2023-10-17 DIAGNOSIS — M75101 Unspecified rotator cuff tear or rupture of right shoulder, not specified as traumatic: Secondary | ICD-10-CM | POA: Diagnosis not present

## 2023-10-17 NOTE — Progress Notes (Signed)
   Subjective: 1 Day Post-Op Procedure(s) (LRB): REVERSE SHOULDER ARTHROPLASTY (Right)  Pt resting comfortably in no acute distress Block still present with continued numbness into right hand Denies any new symptoms/issues overnight Patient reports pain as none.  Objective:   VITALS:   Vitals:   10/17/23 0222 10/17/23 0510  BP: (!) 142/73 125/69  Pulse: 64 67  Resp: 18 18  Temp: 97.7 F (36.5 C) 98.5 F (36.9 C)  SpO2: 94% 95%    Right shoulder: dressing changed Nv intact distally No rashes or edema distally Sling in good position  LABS No results for input(s): "HGB", "HCT", "WBC", "PLT" in the last 72 hours.  No results for input(s): "NA", "K", "BUN", "CREATININE", "GLUCOSE" in the last 72 hours.   Assessment/Plan: 1 Day Post-Op Procedure(s) (LRB): REVERSE SHOULDER ARTHROPLASTY (Right) D/c home today F/u in the office in 2 weeks Pain management as needed     Connor Wilkerson, MPAS [facility] is now [facility] [address], [address], [place], [place] [address] Phone: [phone] [email or web address] Facebook  [employer]

## 2023-10-17 NOTE — Discharge Summary (Signed)
In most cases prophylactic antibiotics for Dental procdeures after total joint surgery are not necessary.  Exceptions are as follows:  1. History of prior total joint infection  2. Severely immunocompromised (Organ Transplant, cancer chemotherapy, Rheumatoid biologic meds such as Humera)  3. Poorly controlled diabetes (A1C &gt; 8.0, blood glucose over 200)  If you have one of these conditions, contact your surgeon for an antibiotic prescription, prior to your dental procedure. Orthopedic Discharge Summary        Physician Discharge Summary  Patient ID: Connor Wilkerson MRN: [id number] DOB/AGE: 1941-07-27 82 y.o.  Admit date: 10/16/2023 Discharge date: 10/17/2023   Procedures:  Procedure(s) (LRB): REVERSE SHOULDER ARTHROPLASTY (Right)  Attending Physician:  Dr. Malon Kindle  Admission Diagnoses:   right shoulder cuff arthropathy  Discharge Diagnoses:  right shoulder cuff arthropathy    Past Medical History:  Diagnosis Date   Abnormality of gait 02/07/2015   Arthritis    shoulders, pinched nerve around waist and back   Asthma    CAD (coronary artery disease)    Hx of Inf MI treated with CABG in 1999 // Myoview 11/16: mild inf-lat ischemia, EF 34, high risk // LHC 3/19: 3/3 bypass grafts patent   Carotid artery disease (HCC)    Followed by [facility] (Dr. Myra Gianotti) // [place] in 2/19: bilat ICA 40-59   Chronic systolic CHF (congestive heart failure) (HCC) 01/01/2010   Echo 12/30/2017: Mild LVH, EF 30-35, diffuse HK, grade 1 diastolic dysfunction, mildly reduced RVSF // Echo 04/12/2016:  EF 35-40, PASP 39, mild to moderate TR, mild LAE // Echo (12/14):  Base/mid inferior and inferolateral HK, EF 45%, mild LAE, mildly reduced RVSF   CKD (chronic kidney disease) Stage 3 B    followed by dr peoples q year lov 04-23-2022 on chart   Depression    HLP (hyperkeratosis lenticularis perstans)    HTN (hypertension)    ICD (implantable cardiac defibrillator)  BSx    [employer]    Ischemic cardiomyopathy    Myocardial infarction [facility]) 1999   Near syncope 09/21/2014   Orthostatic hypotension    PAF (paroxysmal atrial fibrillation) (HCC)    Wears glasses    Wears partial dentures    upper    PCP: Paulina Fusi, MD   Discharged Condition: good  Hospital Course:  Patient underwent the above stated procedure on 10/16/2023. Patient tolerated the procedure well and brought to the recovery room in good condition and subsequently to the floor. Patient had an uncomplicated hospital course and was stable for discharge.   Disposition: Discharge disposition: 01-Home or Self Care      with follow up in 2 weeks    Follow-up Information     Beverely Low, MD. Schedule an appointment as soon as possible for a visit in 2 week(s).   Specialty: Orthopedic Surgery Why: [phone] Contact information: [address] [place] [address] [phone]         Beverely Low, MD. Call in 2 week(s).   Specialty: Orthopedic Surgery Why: call [phone] for appt Contact information: [address] [place] [address] [place] [address] [phone]                 Dental Antibiotics:  In most cases prophylactic antibiotics for Dental procdeures after total joint surgery are not necessary.  Exceptions are as follows:  1. History of prior total joint infection  2. Severely immunocompromised (Organ Transplant, cancer chemotherapy, Rheumatoid biologic meds such as Humera)  3. Poorly  controlled diabetes (A1C &gt; 8.0, blood glucose over 200)  If you have one of these conditions, contact your surgeon for an antibiotic prescription, prior to your dental procedure.  Discharge Instructions     Call MD / Call 911   Complete by: As directed    If you experience chest pain or shortness of breath, CALL 911 and be transported to the hospital emergency room.  If you develope a fever above 101 F, pus (white drainage) or increased drainage  or redness at the wound, or calf pain, call your surgeon's office.   Constipation Prevention   Complete by: As directed    Drink plenty of fluids.  Prune juice may be helpful.  You may use a stool softener, such as Colace (over the counter) 100 mg twice a day.  Use MiraLax (over the counter) for constipation as needed.   Diet - low sodium heart healthy   Complete by: As directed    Increase activity slowly as tolerated   Complete by: As directed    Post-operative opioid taper instructions:   Complete by: As directed    POST-OPERATIVE OPIOID TAPER INSTRUCTIONS: It is important to wean off of your opioid medication as soon as possible. If you do not need pain medication after your surgery it is ok to stop day one. Opioids include: Codeine, Hydrocodone(Norco, Vicodin), Oxycodone(Percocet, oxycontin) and hydromorphone amongst others.  Long term and even short term use of opiods can cause: Increased pain response Dependence Constipation Depression Respiratory depression And more.  Withdrawal symptoms can include Flu like symptoms Nausea, vomiting And more Techniques to manage these symptoms Hydrate well Eat regular healthy meals Stay active Use relaxation techniques(deep breathing, meditating, yoga) Do Not substitute Alcohol to help with tapering If you have been on opioids for less than two weeks and do not have pain than it is ok to stop all together.  Plan to wean off of opioids This plan should start within one week post op of your joint replacement. Maintain the same interval or time between taking each dose and first decrease the dose.  Cut the total daily intake of opioids by one tablet each day Next start to increase the time between doses. The last dose that should be eliminated is the evening dose.          Allergies as of 10/17/2023       Reactions   Tramadol Other (See Comments)   Unknown Reaction -  Unsure of reaction         Medication List     TAKE  these medications    amLODipine 2.5 MG tablet Commonly known as: NORVASC Take 2 tablets (5 mg total) by mouth daily. What changed: how much to take   buPROPion 150 MG 12 hr tablet Commonly known as: WELLBUTRIN SR Take 300 mg by mouth daily.   Eliquis 2.5 MG Tabs tablet Generic drug: apixaban TAKE 1 TABLET BY MOUTH TWICE A DAY   Entresto 24-26 MG Generic drug: sacubitril-valsartan Take 1/2 tablet by mouth 2 times daily.   escitalopram 10 MG tablet Commonly known as: LEXAPRO Take 10 mg by mouth daily.   famotidine 20 MG tablet Commonly known as: PEPCID Take 20 mg by mouth 2 (two) times daily.   gabapentin 300 MG capsule Commonly known as: NEURONTIN Take 1,200 mg by mouth at bedtime.   hydrALAZINE 25 MG tablet Commonly known as: APRESOLINE Take 1 tablet twice daily as needed for systolic blood pressure (top number) greater than 160.  HYDROcodone-acetaminophen 5-325 MG tablet Commonly known as: Norco Take 1 tablet by mouth every 6 (six) hours as needed for moderate pain (pain score 4-6).   nitroGLYCERIN 0.4 MG SL tablet Commonly known as: NITROSTAT Place 1 tablet (0.4 mg total) under the tongue every 5 (five) minutes as needed for chest pain.   rosuvastatin 40 MG tablet Commonly known as: CRESTOR TAKE 1 TABLET BY MOUTH EVERY DAY   traZODone 50 MG tablet Commonly known as: DESYREL Take 50 mg by mouth at bedtime.          Signed: Thea Gist 10/17/2023, 7:44 AM  [facility] is now [employer] [address], [address], [place], [place] [address] Phone: [phone] Facebook  [employer]agram  [employer]

## 2023-10-17 NOTE — Progress Notes (Signed)
Occupational Therapy Treatment Patient Details Name: Connor Wilkerson MRN: [id number] DOB: Feb 04, 1941 Today's Date: 10/17/2023   History of present illness Pt is a 82 y.o. male s/p R reverse shoulder arthroplasty on 10/16/23. PMH significant for CHF, CKD, HTN, pacemaker, ICD, L elbow surgery, cardiac cath, L toe amputation   OT comments  Pt reports lawn mower man will assist him.  Pt will also benefit from [facility] OT and PT to address home safety and ADL activity .  Pt did call PA to discuss      If plan is discharge home, recommend the following:  A little help with walking and/or transfers;A lot of help with bathing/dressing/bathroom;Assistance with cooking/housework;Direct supervision/assist for medications management;Direct supervision/assist for financial management;Assist for transportation;Help with stairs or ramp for entrance;Supervision due to cognitive status   Equipment Recommendations  None recommended by OT (pt states he has necessary DME)    Recommendations for Other Services PT consult    Precautions / Restrictions Precautions Precautions: Shoulder Type of Shoulder Precautions: NWB (Sling at all times except ADL/exercise; Non weight bearing; AROM elbow, wrist and hand to tolerance) Shoulder Interventions: Shoulder sling/immobilizer;Off for dressing/bathing/exercises Precaution Booklet Issued: Yes (comment) Precaution Comments: ok for gentle hand to face ADLs and may use for  balance during mobilization Required Braces or Orthoses: Sling Restrictions Weight Bearing Restrictions Per Provider Order: Yes RUE Weight Bearing Per Provider Order: Non weight bearing (Per OT order "ok for gentle hand to face ADLs and may use for balance during mobilization")       Mobility Bed Mobility Overal bed mobility: Needs Assistance Bed Mobility: Supine to Sit, Sit to Supine     Supine to sit: Min assist Sit to supine: Min assist   General bed mobility comments: cues to avoid pushing  up from RUE immobilized in sling    Transfers Overall transfer level: Needs assistance Equipment used: None Transfers: Sit to/from Stand Sit to Stand: Contact guard assist                 Balance Overall balance assessment: History of Falls, Mild deficits observed, not formally tested                                         ADL either performed or assessed with clinical judgement   ADL Overall ADL's : Needs assistance/impaired Eating/Feeding: Set up;Sitting   Grooming: Wash/dry hands;Wash/dry face;Sitting;Set up   Upper Body Bathing: Sitting;Cueing for UE precautions;Minimal assistance   Lower Body Bathing: Sitting/lateral leans;Sit to/from stand;Cueing for safety;Cueing for sequencing;Minimal assistance   Upper Body Dressing : Adhering to UE precautions;Sitting;Minimal assistance   Lower Body Dressing: Sit to/from stand;Cueing for sequencing;Cueing for safety;Minimal assistance   Toilet Transfer: Minimal assistance;Ambulation;BSC/3in1;Regular Toilet;Cueing for sequencing;Cueing for safety   Toileting- Clothing Manipulation and Hygiene: Sit to/from stand;Sitting/lateral lean;Minimal assistance       Functional mobility during ADLs: Contact guard assist General ADL Comments: Pt greatly limited by poor STM, impulsivity, decreased awareness of deficits and safety    Extremity/Trunk Assessment         Cervical / Trunk Assessment Cervical / Trunk Assessment: Normal    Vision Baseline Vision/History: 0 No visual deficits            Cognition Arousal: Alert Behavior During Therapy: Impulsive, WFL for tasks assessed/performed Overall Cognitive Status: Within Functional Limits for tasks assessed  Memory: Decreased recall of precautions, Decreased short-term memory Following Commands: Follows one step commands inconsistently Safety/Judgement: Decreased awareness of deficits, Decreased awareness of  safety Awareness: Intellectual Problem Solving: Slow processing, Requires verbal cues, Requires tactile cues General Comments: Pt alert to name, DOB, tells this author year is 67, oriented to location + situation        Exercises  Elbow, wrist and hand ROM with min- mod A    Shoulder Instructions Shoulder Instructions Donning/doffing shirt without moving shoulder: Minimal assistance Method for sponge bathing under operated UE: Minimal assistance Donning/doffing sling/immobilizer: Minimal assistance Correct positioning of sling/immobilizer: Minimal assistance ROM for elbow, wrist and digits of operated UE: Moderate assistance Proper positioning of operated UE when showering: Moderate assistance Positioning of UE while sleeping: Moderate assistance     General Comments  Decreased safety awareness with R shoulder precautions    Pertinent Vitals/ Pain       Pain Assessment Pain Assessment: No/denies pain  Home Living                    Cares for wife in [facility]                      Prior Functioning/Environment         Mod I     Frequency  Min 1X/week        Progress Toward Goals  OT Goals(current goals can now be found in the care plan section)  Progress towards OT goals: Progressing toward goals     Plan   DC with HHOT      AM-PAC OT "6 Clicks" Daily Activity     Outcome Measure   Help from another person eating meals?: A Little Help from another person taking care of personal grooming?: A Little Help from another person toileting, which includes using toliet, bedpan, or urinal?: A Little Help from another person bathing (including washing, rinsing, drying)?: A Little Help from another person to put on and taking off regular upper body clothing?: A Little Help from another person to put on and taking off regular lower body clothing?: A Little 6 Click Score: 18    End of Session Equipment Utilized During Treatment: Other (comment)  (sling)  OT Visit Diagnosis: Other abnormalities of gait and mobility (R26.89);Muscle weakness (generalized) (M62.81);Other (comment);History of falling (Z91.81) (impaired dominant RUE use)   Activity Tolerance Patient tolerated treatment well   Patient Left with call bell/phone within reach;with nursing/sitter in room;with SCD's reapplied;in chair   Nurse Communication Mobility status;Weight bearing status;Precautions;Other (comment) (updated RN on pt's impulsivity, need for increased supervision/cuing to adhere to shoulder precautions, and pt's poor recall of education provided)        Time: 7169-6789 OT Time Calculation (min): 45 min  Charges: OT General Charges $OT Visit: 1 Visit OT Treatments $Self Care/Home Management : 38-52 mins   Shayleen Eppinger, [place] Kung 10/17/2023, 11:20 AM

## 2023-10-17 NOTE — Plan of Care (Signed)
  Problem: Education: Goal: Knowledge of General Education information will improve Description: Including pain rating scale, medication(s)/side effects and non-pharmacologic comfort measures Outcome: Adequate for Discharge   Problem: Health Behavior/Discharge Planning: Goal: Ability to manage health-related needs will improve Outcome: Adequate for Discharge   Problem: Clinical Measurements: Goal: Ability to maintain clinical measurements within normal limits will improve Outcome: Adequate for Discharge Goal: Will remain free from infection Outcome: Adequate for Discharge Goal: Diagnostic test results will improve Outcome: Adequate for Discharge Goal: Respiratory complications will improve Outcome: Adequate for Discharge Goal: Cardiovascular complication will be avoided Outcome: Adequate for Discharge   Problem: Activity: Goal: Risk for activity intolerance will decrease Outcome: Adequate for Discharge   Problem: Coping: Goal: Level of anxiety will decrease Outcome: Adequate for Discharge   Problem: Elimination: Goal: Will not experience complications related to bowel motility Outcome: Adequate for Discharge   Problem: Pain Management: Goal: General experience of comfort will improve Outcome: Adequate for Discharge   Problem: Safety: Goal: Ability to remain free from injury will improve Outcome: Adequate for Discharge   Problem: Skin Integrity: Goal: Risk for impaired skin integrity will decrease Outcome: Adequate for Discharge   Problem: Education: Goal: Knowledge of the prescribed therapeutic regimen will improve Outcome: Adequate for Discharge Goal: Understanding of activity limitations/precautions following surgery will improve Outcome: Adequate for Discharge   Problem: Activity: Goal: Ability to tolerate increased activity will improve Outcome: Adequate for Discharge   Problem: Pain Management: Goal: Pain level will decrease with appropriate  interventions Outcome: Adequate for Discharge

## 2023-10-17 NOTE — Plan of Care (Signed)
  Problem: Education: Goal: Knowledge of General Education information will improve Description: Including pain rating scale, medication(s)/side effects and non-pharmacologic comfort measures Outcome: Progressing   Problem: Health Behavior/Discharge Planning: Goal: Ability to manage health-related needs will improve Outcome: Progressing   Problem: Clinical Measurements: Goal: Ability to maintain clinical measurements within normal limits will improve Outcome: Progressing Goal: Will remain free from infection Outcome: Progressing Goal: Diagnostic test results will improve Outcome: Progressing Goal: Respiratory complications will improve Outcome: Adequate for Discharge Goal: Cardiovascular complication will be avoided Outcome: Adequate for Discharge   Problem: Activity: Goal: Risk for activity intolerance will decrease Outcome: Adequate for Discharge   Problem: Nutrition: Goal: Adequate nutrition will be maintained Outcome: Completed/Met   Problem: Coping: Goal: Level of anxiety will decrease Outcome: Progressing   Problem: Elimination: Goal: Will not experience complications related to bowel motility Outcome: Progressing Goal: Will not experience complications related to urinary retention Outcome: Completed/Met   Problem: Pain Management: Goal: General experience of comfort will improve Outcome: Progressing   Problem: Safety: Goal: Ability to remain free from injury will improve Outcome: Progressing   Problem: Skin Integrity: Goal: Risk for impaired skin integrity will decrease Outcome: Progressing   Problem: Education: Goal: Knowledge of the prescribed therapeutic regimen will improve Outcome: Progressing Goal: Understanding of activity limitations/precautions following surgery will improve Outcome: Progressing Goal: Individualized Educational Video(s) Outcome: Completed/Met   Problem: Activity: Goal: Ability to tolerate increased activity will  improve Outcome: Progressing   Problem: Pain Management: Goal: Pain level will decrease with appropriate interventions Outcome: Progressing

## 2023-10-17 NOTE — TOC Progression Note (Signed)
Transition of Care [facility]) - Progression Note    Patient Details  Name: Connor Wilkerson MRN: [id number] Date of Birth: 05-01-1941  Transition of Care [facility]) CM/SW Contact  Georgie Chard, LCSW Phone Number: 10/17/2023, 1:32 PM  Clinical Narrative:     CSW has spoke to the patient in regards to [facility]. At this time the patient has agreed to [facility] and reports having no preference. CSW has set up [facility] with [facility]. At this time CSW has made the RN aware and patient can DC. At this time there are no further TOC needs.        Expected Discharge Plan and Services         Expected Discharge Date: 10/17/23               DME Arranged: N/A                     Social Determinants of Health (SDOH) Interventions SDOH Screenings   Food Insecurity: No Food Insecurity (10/16/2023)  Housing: Low Risk  (10/16/2023)  Transportation Needs: No Transportation Needs (10/16/2023)  Utilities: Not At Risk (10/16/2023)  Tobacco Use: Medium Risk (10/16/2023)    Readmission Risk Interventions     No data to display

## 2023-10-18 DIAGNOSIS — M25511 Pain in right shoulder: Secondary | ICD-10-CM | POA: Diagnosis not present

## 2023-10-18 DIAGNOSIS — G8918 Other acute postprocedural pain: Secondary | ICD-10-CM | POA: Diagnosis not present

## 2023-10-19 ENCOUNTER — Encounter (HOSPITAL_COMMUNITY): Payer: Self-pay | Admitting: Orthopedic Surgery

## 2023-10-20 DIAGNOSIS — E1122 Type 2 diabetes mellitus with diabetic chronic kidney disease: Secondary | ICD-10-CM | POA: Diagnosis not present

## 2023-10-20 DIAGNOSIS — Z96611 Presence of right artificial shoulder joint: Secondary | ICD-10-CM | POA: Diagnosis not present

## 2023-10-20 DIAGNOSIS — I951 Orthostatic hypotension: Secondary | ICD-10-CM | POA: Diagnosis not present

## 2023-10-20 DIAGNOSIS — I5022 Chronic systolic (congestive) heart failure: Secondary | ICD-10-CM | POA: Diagnosis not present

## 2023-10-20 DIAGNOSIS — I130 Hypertensive heart and chronic kidney disease with heart failure and stage 1 through stage 4 chronic kidney disease, or unspecified chronic kidney disease: Secondary | ICD-10-CM | POA: Diagnosis not present

## 2023-10-20 DIAGNOSIS — E1165 Type 2 diabetes mellitus with hyperglycemia: Secondary | ICD-10-CM | POA: Diagnosis not present

## 2023-10-20 DIAGNOSIS — N1832 Chronic kidney disease, stage 3b: Secondary | ICD-10-CM | POA: Diagnosis not present

## 2023-10-20 DIAGNOSIS — D849 Immunodeficiency, unspecified: Secondary | ICD-10-CM | POA: Diagnosis not present

## 2023-10-20 DIAGNOSIS — Z471 Aftercare following joint replacement surgery: Secondary | ICD-10-CM | POA: Diagnosis not present

## 2023-10-22 DIAGNOSIS — N1832 Chronic kidney disease, stage 3b: Secondary | ICD-10-CM | POA: Diagnosis not present

## 2023-10-22 DIAGNOSIS — E1165 Type 2 diabetes mellitus with hyperglycemia: Secondary | ICD-10-CM | POA: Diagnosis not present

## 2023-10-22 DIAGNOSIS — I951 Orthostatic hypotension: Secondary | ICD-10-CM | POA: Diagnosis not present

## 2023-10-22 DIAGNOSIS — I130 Hypertensive heart and chronic kidney disease with heart failure and stage 1 through stage 4 chronic kidney disease, or unspecified chronic kidney disease: Secondary | ICD-10-CM | POA: Diagnosis not present

## 2023-10-22 DIAGNOSIS — I5022 Chronic systolic (congestive) heart failure: Secondary | ICD-10-CM | POA: Diagnosis not present

## 2023-10-22 DIAGNOSIS — Z96611 Presence of right artificial shoulder joint: Secondary | ICD-10-CM | POA: Diagnosis not present

## 2023-10-22 DIAGNOSIS — D849 Immunodeficiency, unspecified: Secondary | ICD-10-CM | POA: Diagnosis not present

## 2023-10-22 DIAGNOSIS — Z471 Aftercare following joint replacement surgery: Secondary | ICD-10-CM | POA: Diagnosis not present

## 2023-10-22 DIAGNOSIS — E1122 Type 2 diabetes mellitus with diabetic chronic kidney disease: Secondary | ICD-10-CM | POA: Diagnosis not present

## 2023-10-23 DIAGNOSIS — I130 Hypertensive heart and chronic kidney disease with heart failure and stage 1 through stage 4 chronic kidney disease, or unspecified chronic kidney disease: Secondary | ICD-10-CM | POA: Diagnosis not present

## 2023-10-23 DIAGNOSIS — Z471 Aftercare following joint replacement surgery: Secondary | ICD-10-CM | POA: Diagnosis not present

## 2023-10-23 DIAGNOSIS — E1165 Type 2 diabetes mellitus with hyperglycemia: Secondary | ICD-10-CM | POA: Diagnosis not present

## 2023-10-23 DIAGNOSIS — N1832 Chronic kidney disease, stage 3b: Secondary | ICD-10-CM | POA: Diagnosis not present

## 2023-10-23 DIAGNOSIS — E1122 Type 2 diabetes mellitus with diabetic chronic kidney disease: Secondary | ICD-10-CM | POA: Diagnosis not present

## 2023-10-23 DIAGNOSIS — I5022 Chronic systolic (congestive) heart failure: Secondary | ICD-10-CM | POA: Diagnosis not present

## 2023-10-23 DIAGNOSIS — Z96611 Presence of right artificial shoulder joint: Secondary | ICD-10-CM | POA: Diagnosis not present

## 2023-10-23 DIAGNOSIS — D849 Immunodeficiency, unspecified: Secondary | ICD-10-CM | POA: Diagnosis not present

## 2023-10-23 DIAGNOSIS — I951 Orthostatic hypotension: Secondary | ICD-10-CM | POA: Diagnosis not present

## 2023-10-23 NOTE — Progress Notes (Signed)
Remote ICD transmission.   

## 2023-10-27 DIAGNOSIS — Z471 Aftercare following joint replacement surgery: Secondary | ICD-10-CM | POA: Diagnosis not present

## 2023-10-27 DIAGNOSIS — E1165 Type 2 diabetes mellitus with hyperglycemia: Secondary | ICD-10-CM | POA: Diagnosis not present

## 2023-10-27 DIAGNOSIS — D849 Immunodeficiency, unspecified: Secondary | ICD-10-CM | POA: Diagnosis not present

## 2023-10-27 DIAGNOSIS — I5022 Chronic systolic (congestive) heart failure: Secondary | ICD-10-CM | POA: Diagnosis not present

## 2023-10-27 DIAGNOSIS — E1122 Type 2 diabetes mellitus with diabetic chronic kidney disease: Secondary | ICD-10-CM | POA: Diagnosis not present

## 2023-10-27 DIAGNOSIS — Z96611 Presence of right artificial shoulder joint: Secondary | ICD-10-CM | POA: Diagnosis not present

## 2023-10-27 DIAGNOSIS — N1832 Chronic kidney disease, stage 3b: Secondary | ICD-10-CM | POA: Diagnosis not present

## 2023-10-27 DIAGNOSIS — I951 Orthostatic hypotension: Secondary | ICD-10-CM | POA: Diagnosis not present

## 2023-10-27 DIAGNOSIS — I130 Hypertensive heart and chronic kidney disease with heart failure and stage 1 through stage 4 chronic kidney disease, or unspecified chronic kidney disease: Secondary | ICD-10-CM | POA: Diagnosis not present

## 2023-10-29 DIAGNOSIS — D849 Immunodeficiency, unspecified: Secondary | ICD-10-CM | POA: Diagnosis not present

## 2023-10-29 DIAGNOSIS — N1832 Chronic kidney disease, stage 3b: Secondary | ICD-10-CM | POA: Diagnosis not present

## 2023-10-29 DIAGNOSIS — E1165 Type 2 diabetes mellitus with hyperglycemia: Secondary | ICD-10-CM | POA: Diagnosis not present

## 2023-10-29 DIAGNOSIS — I5022 Chronic systolic (congestive) heart failure: Secondary | ICD-10-CM | POA: Diagnosis not present

## 2023-10-29 DIAGNOSIS — Z471 Aftercare following joint replacement surgery: Secondary | ICD-10-CM | POA: Diagnosis not present

## 2023-10-29 DIAGNOSIS — I130 Hypertensive heart and chronic kidney disease with heart failure and stage 1 through stage 4 chronic kidney disease, or unspecified chronic kidney disease: Secondary | ICD-10-CM | POA: Diagnosis not present

## 2023-10-29 DIAGNOSIS — Z96611 Presence of right artificial shoulder joint: Secondary | ICD-10-CM | POA: Diagnosis not present

## 2023-10-29 DIAGNOSIS — E1122 Type 2 diabetes mellitus with diabetic chronic kidney disease: Secondary | ICD-10-CM | POA: Diagnosis not present

## 2023-10-29 DIAGNOSIS — I951 Orthostatic hypotension: Secondary | ICD-10-CM | POA: Diagnosis not present

## 2023-11-02 DIAGNOSIS — D849 Immunodeficiency, unspecified: Secondary | ICD-10-CM | POA: Diagnosis not present

## 2023-11-02 DIAGNOSIS — E1165 Type 2 diabetes mellitus with hyperglycemia: Secondary | ICD-10-CM | POA: Diagnosis not present

## 2023-11-02 DIAGNOSIS — E1122 Type 2 diabetes mellitus with diabetic chronic kidney disease: Secondary | ICD-10-CM | POA: Diagnosis not present

## 2023-11-02 DIAGNOSIS — I5022 Chronic systolic (congestive) heart failure: Secondary | ICD-10-CM | POA: Diagnosis not present

## 2023-11-02 DIAGNOSIS — Z471 Aftercare following joint replacement surgery: Secondary | ICD-10-CM | POA: Diagnosis not present

## 2023-11-02 DIAGNOSIS — I130 Hypertensive heart and chronic kidney disease with heart failure and stage 1 through stage 4 chronic kidney disease, or unspecified chronic kidney disease: Secondary | ICD-10-CM | POA: Diagnosis not present

## 2023-11-02 DIAGNOSIS — I951 Orthostatic hypotension: Secondary | ICD-10-CM | POA: Diagnosis not present

## 2023-11-02 DIAGNOSIS — N1832 Chronic kidney disease, stage 3b: Secondary | ICD-10-CM | POA: Diagnosis not present

## 2023-11-02 DIAGNOSIS — Z96611 Presence of right artificial shoulder joint: Secondary | ICD-10-CM | POA: Diagnosis not present

## 2023-11-03 ENCOUNTER — Ambulatory Visit (HOSPITAL_COMMUNITY): Payer: Medicare PPO

## 2023-11-03 ENCOUNTER — Ambulatory Visit: Payer: Medicare PPO

## 2023-11-05 DIAGNOSIS — Z4789 Encounter for other orthopedic aftercare: Secondary | ICD-10-CM | POA: Diagnosis not present

## 2023-11-06 DIAGNOSIS — Z96611 Presence of right artificial shoulder joint: Secondary | ICD-10-CM | POA: Diagnosis not present

## 2023-11-06 DIAGNOSIS — Z471 Aftercare following joint replacement surgery: Secondary | ICD-10-CM | POA: Diagnosis not present

## 2023-11-06 DIAGNOSIS — E1165 Type 2 diabetes mellitus with hyperglycemia: Secondary | ICD-10-CM | POA: Diagnosis not present

## 2023-11-06 DIAGNOSIS — I130 Hypertensive heart and chronic kidney disease with heart failure and stage 1 through stage 4 chronic kidney disease, or unspecified chronic kidney disease: Secondary | ICD-10-CM | POA: Diagnosis not present

## 2023-11-06 DIAGNOSIS — N1832 Chronic kidney disease, stage 3b: Secondary | ICD-10-CM | POA: Diagnosis not present

## 2023-11-06 DIAGNOSIS — E1122 Type 2 diabetes mellitus with diabetic chronic kidney disease: Secondary | ICD-10-CM | POA: Diagnosis not present

## 2023-11-06 DIAGNOSIS — D849 Immunodeficiency, unspecified: Secondary | ICD-10-CM | POA: Diagnosis not present

## 2023-11-06 DIAGNOSIS — I5022 Chronic systolic (congestive) heart failure: Secondary | ICD-10-CM | POA: Diagnosis not present

## 2023-11-06 DIAGNOSIS — I951 Orthostatic hypotension: Secondary | ICD-10-CM | POA: Diagnosis not present

## 2023-11-10 DIAGNOSIS — E1122 Type 2 diabetes mellitus with diabetic chronic kidney disease: Secondary | ICD-10-CM | POA: Diagnosis not present

## 2023-11-10 DIAGNOSIS — D849 Immunodeficiency, unspecified: Secondary | ICD-10-CM | POA: Diagnosis not present

## 2023-11-10 DIAGNOSIS — E1165 Type 2 diabetes mellitus with hyperglycemia: Secondary | ICD-10-CM | POA: Diagnosis not present

## 2023-11-10 DIAGNOSIS — I5022 Chronic systolic (congestive) heart failure: Secondary | ICD-10-CM | POA: Diagnosis not present

## 2023-11-10 DIAGNOSIS — I130 Hypertensive heart and chronic kidney disease with heart failure and stage 1 through stage 4 chronic kidney disease, or unspecified chronic kidney disease: Secondary | ICD-10-CM | POA: Diagnosis not present

## 2023-11-10 DIAGNOSIS — Z471 Aftercare following joint replacement surgery: Secondary | ICD-10-CM | POA: Diagnosis not present

## 2023-11-10 DIAGNOSIS — N1832 Chronic kidney disease, stage 3b: Secondary | ICD-10-CM | POA: Diagnosis not present

## 2023-11-10 DIAGNOSIS — Z96611 Presence of right artificial shoulder joint: Secondary | ICD-10-CM | POA: Diagnosis not present

## 2023-11-10 DIAGNOSIS — I951 Orthostatic hypotension: Secondary | ICD-10-CM | POA: Diagnosis not present

## 2023-11-12 DIAGNOSIS — I951 Orthostatic hypotension: Secondary | ICD-10-CM | POA: Diagnosis not present

## 2023-11-12 DIAGNOSIS — N1832 Chronic kidney disease, stage 3b: Secondary | ICD-10-CM | POA: Diagnosis not present

## 2023-11-12 DIAGNOSIS — E1165 Type 2 diabetes mellitus with hyperglycemia: Secondary | ICD-10-CM | POA: Diagnosis not present

## 2023-11-12 DIAGNOSIS — E1122 Type 2 diabetes mellitus with diabetic chronic kidney disease: Secondary | ICD-10-CM | POA: Diagnosis not present

## 2023-11-12 DIAGNOSIS — Z471 Aftercare following joint replacement surgery: Secondary | ICD-10-CM | POA: Diagnosis not present

## 2023-11-12 DIAGNOSIS — D849 Immunodeficiency, unspecified: Secondary | ICD-10-CM | POA: Diagnosis not present

## 2023-11-12 DIAGNOSIS — I5022 Chronic systolic (congestive) heart failure: Secondary | ICD-10-CM | POA: Diagnosis not present

## 2023-11-12 DIAGNOSIS — Z96611 Presence of right artificial shoulder joint: Secondary | ICD-10-CM | POA: Diagnosis not present

## 2023-11-12 DIAGNOSIS — I130 Hypertensive heart and chronic kidney disease with heart failure and stage 1 through stage 4 chronic kidney disease, or unspecified chronic kidney disease: Secondary | ICD-10-CM | POA: Diagnosis not present

## 2023-11-16 DIAGNOSIS — N1832 Chronic kidney disease, stage 3b: Secondary | ICD-10-CM | POA: Diagnosis not present

## 2023-11-16 DIAGNOSIS — Z96611 Presence of right artificial shoulder joint: Secondary | ICD-10-CM | POA: Diagnosis not present

## 2023-11-16 DIAGNOSIS — I951 Orthostatic hypotension: Secondary | ICD-10-CM | POA: Diagnosis not present

## 2023-11-16 DIAGNOSIS — D849 Immunodeficiency, unspecified: Secondary | ICD-10-CM | POA: Diagnosis not present

## 2023-11-16 DIAGNOSIS — Z471 Aftercare following joint replacement surgery: Secondary | ICD-10-CM | POA: Diagnosis not present

## 2023-11-16 DIAGNOSIS — I130 Hypertensive heart and chronic kidney disease with heart failure and stage 1 through stage 4 chronic kidney disease, or unspecified chronic kidney disease: Secondary | ICD-10-CM | POA: Diagnosis not present

## 2023-11-16 DIAGNOSIS — E1165 Type 2 diabetes mellitus with hyperglycemia: Secondary | ICD-10-CM | POA: Diagnosis not present

## 2023-11-16 DIAGNOSIS — E1122 Type 2 diabetes mellitus with diabetic chronic kidney disease: Secondary | ICD-10-CM | POA: Diagnosis not present

## 2023-11-16 DIAGNOSIS — I5022 Chronic systolic (congestive) heart failure: Secondary | ICD-10-CM | POA: Diagnosis not present

## 2023-11-22 ENCOUNTER — Other Ambulatory Visit: Payer: Self-pay | Admitting: Internal Medicine

## 2023-11-25 DIAGNOSIS — E1165 Type 2 diabetes mellitus with hyperglycemia: Secondary | ICD-10-CM | POA: Diagnosis not present

## 2023-11-25 DIAGNOSIS — I951 Orthostatic hypotension: Secondary | ICD-10-CM | POA: Diagnosis not present

## 2023-11-25 DIAGNOSIS — Z471 Aftercare following joint replacement surgery: Secondary | ICD-10-CM | POA: Diagnosis not present

## 2023-11-25 DIAGNOSIS — N1832 Chronic kidney disease, stage 3b: Secondary | ICD-10-CM | POA: Diagnosis not present

## 2023-11-25 DIAGNOSIS — I130 Hypertensive heart and chronic kidney disease with heart failure and stage 1 through stage 4 chronic kidney disease, or unspecified chronic kidney disease: Secondary | ICD-10-CM | POA: Diagnosis not present

## 2023-11-25 DIAGNOSIS — E1122 Type 2 diabetes mellitus with diabetic chronic kidney disease: Secondary | ICD-10-CM | POA: Diagnosis not present

## 2023-11-25 DIAGNOSIS — I5022 Chronic systolic (congestive) heart failure: Secondary | ICD-10-CM | POA: Diagnosis not present

## 2023-11-25 DIAGNOSIS — Z96611 Presence of right artificial shoulder joint: Secondary | ICD-10-CM | POA: Diagnosis not present

## 2023-11-25 DIAGNOSIS — D849 Immunodeficiency, unspecified: Secondary | ICD-10-CM | POA: Diagnosis not present

## 2023-11-26 DIAGNOSIS — N1832 Chronic kidney disease, stage 3b: Secondary | ICD-10-CM | POA: Diagnosis not present

## 2023-11-26 DIAGNOSIS — Z96611 Presence of right artificial shoulder joint: Secondary | ICD-10-CM | POA: Diagnosis not present

## 2023-11-26 DIAGNOSIS — D849 Immunodeficiency, unspecified: Secondary | ICD-10-CM | POA: Diagnosis not present

## 2023-11-26 DIAGNOSIS — I130 Hypertensive heart and chronic kidney disease with heart failure and stage 1 through stage 4 chronic kidney disease, or unspecified chronic kidney disease: Secondary | ICD-10-CM | POA: Diagnosis not present

## 2023-11-26 DIAGNOSIS — E1165 Type 2 diabetes mellitus with hyperglycemia: Secondary | ICD-10-CM | POA: Diagnosis not present

## 2023-11-26 DIAGNOSIS — I5022 Chronic systolic (congestive) heart failure: Secondary | ICD-10-CM | POA: Diagnosis not present

## 2023-11-26 DIAGNOSIS — I951 Orthostatic hypotension: Secondary | ICD-10-CM | POA: Diagnosis not present

## 2023-11-26 DIAGNOSIS — E1122 Type 2 diabetes mellitus with diabetic chronic kidney disease: Secondary | ICD-10-CM | POA: Diagnosis not present

## 2023-11-26 DIAGNOSIS — Z471 Aftercare following joint replacement surgery: Secondary | ICD-10-CM | POA: Diagnosis not present

## 2023-11-30 ENCOUNTER — Other Ambulatory Visit (HOSPITAL_COMMUNITY): Payer: Self-pay | Admitting: Internal Medicine

## 2023-11-30 DIAGNOSIS — Z471 Aftercare following joint replacement surgery: Secondary | ICD-10-CM | POA: Diagnosis not present

## 2023-11-30 DIAGNOSIS — E1165 Type 2 diabetes mellitus with hyperglycemia: Secondary | ICD-10-CM | POA: Diagnosis not present

## 2023-11-30 DIAGNOSIS — I130 Hypertensive heart and chronic kidney disease with heart failure and stage 1 through stage 4 chronic kidney disease, or unspecified chronic kidney disease: Secondary | ICD-10-CM | POA: Diagnosis not present

## 2023-11-30 DIAGNOSIS — E1122 Type 2 diabetes mellitus with diabetic chronic kidney disease: Secondary | ICD-10-CM | POA: Diagnosis not present

## 2023-11-30 DIAGNOSIS — I5022 Chronic systolic (congestive) heart failure: Secondary | ICD-10-CM | POA: Diagnosis not present

## 2023-11-30 DIAGNOSIS — Z96611 Presence of right artificial shoulder joint: Secondary | ICD-10-CM | POA: Diagnosis not present

## 2023-11-30 DIAGNOSIS — N1832 Chronic kidney disease, stage 3b: Secondary | ICD-10-CM | POA: Diagnosis not present

## 2023-11-30 DIAGNOSIS — I951 Orthostatic hypotension: Secondary | ICD-10-CM | POA: Diagnosis not present

## 2023-11-30 DIAGNOSIS — D849 Immunodeficiency, unspecified: Secondary | ICD-10-CM | POA: Diagnosis not present

## 2023-11-30 DIAGNOSIS — I480 Paroxysmal atrial fibrillation: Secondary | ICD-10-CM

## 2023-12-01 NOTE — Telephone Encounter (Signed)
Prescription refill request for Eliquis received. Indication:afib Last office visit:10/24 Scr:2.15  12/24 Age: 83 Weight:69.7  kg  Prescription refilled

## 2023-12-04 DIAGNOSIS — Z471 Aftercare following joint replacement surgery: Secondary | ICD-10-CM | POA: Diagnosis not present

## 2023-12-04 DIAGNOSIS — D849 Immunodeficiency, unspecified: Secondary | ICD-10-CM | POA: Diagnosis not present

## 2023-12-04 DIAGNOSIS — Z96611 Presence of right artificial shoulder joint: Secondary | ICD-10-CM | POA: Diagnosis not present

## 2023-12-04 DIAGNOSIS — E1122 Type 2 diabetes mellitus with diabetic chronic kidney disease: Secondary | ICD-10-CM | POA: Diagnosis not present

## 2023-12-04 DIAGNOSIS — N1832 Chronic kidney disease, stage 3b: Secondary | ICD-10-CM | POA: Diagnosis not present

## 2023-12-04 DIAGNOSIS — E1165 Type 2 diabetes mellitus with hyperglycemia: Secondary | ICD-10-CM | POA: Diagnosis not present

## 2023-12-04 DIAGNOSIS — I5022 Chronic systolic (congestive) heart failure: Secondary | ICD-10-CM | POA: Diagnosis not present

## 2023-12-04 DIAGNOSIS — I130 Hypertensive heart and chronic kidney disease with heart failure and stage 1 through stage 4 chronic kidney disease, or unspecified chronic kidney disease: Secondary | ICD-10-CM | POA: Diagnosis not present

## 2023-12-04 DIAGNOSIS — I951 Orthostatic hypotension: Secondary | ICD-10-CM | POA: Diagnosis not present

## 2023-12-07 DIAGNOSIS — Z96611 Presence of right artificial shoulder joint: Secondary | ICD-10-CM | POA: Diagnosis not present

## 2023-12-07 DIAGNOSIS — N1832 Chronic kidney disease, stage 3b: Secondary | ICD-10-CM | POA: Diagnosis not present

## 2023-12-07 DIAGNOSIS — I130 Hypertensive heart and chronic kidney disease with heart failure and stage 1 through stage 4 chronic kidney disease, or unspecified chronic kidney disease: Secondary | ICD-10-CM | POA: Diagnosis not present

## 2023-12-07 DIAGNOSIS — E1165 Type 2 diabetes mellitus with hyperglycemia: Secondary | ICD-10-CM | POA: Diagnosis not present

## 2023-12-07 DIAGNOSIS — D849 Immunodeficiency, unspecified: Secondary | ICD-10-CM | POA: Diagnosis not present

## 2023-12-07 DIAGNOSIS — E1122 Type 2 diabetes mellitus with diabetic chronic kidney disease: Secondary | ICD-10-CM | POA: Diagnosis not present

## 2023-12-07 DIAGNOSIS — I951 Orthostatic hypotension: Secondary | ICD-10-CM | POA: Diagnosis not present

## 2023-12-07 DIAGNOSIS — I5022 Chronic systolic (congestive) heart failure: Secondary | ICD-10-CM | POA: Diagnosis not present

## 2023-12-07 DIAGNOSIS — Z471 Aftercare following joint replacement surgery: Secondary | ICD-10-CM | POA: Diagnosis not present

## 2023-12-08 DIAGNOSIS — M542 Cervicalgia: Secondary | ICD-10-CM | POA: Diagnosis not present

## 2023-12-08 DIAGNOSIS — Z4789 Encounter for other orthopedic aftercare: Secondary | ICD-10-CM | POA: Diagnosis not present

## 2023-12-08 DIAGNOSIS — M5412 Radiculopathy, cervical region: Secondary | ICD-10-CM | POA: Diagnosis not present

## 2023-12-10 DIAGNOSIS — I130 Hypertensive heart and chronic kidney disease with heart failure and stage 1 through stage 4 chronic kidney disease, or unspecified chronic kidney disease: Secondary | ICD-10-CM | POA: Diagnosis not present

## 2023-12-10 DIAGNOSIS — I951 Orthostatic hypotension: Secondary | ICD-10-CM | POA: Diagnosis not present

## 2023-12-10 DIAGNOSIS — D849 Immunodeficiency, unspecified: Secondary | ICD-10-CM | POA: Diagnosis not present

## 2023-12-10 DIAGNOSIS — N1832 Chronic kidney disease, stage 3b: Secondary | ICD-10-CM | POA: Diagnosis not present

## 2023-12-10 DIAGNOSIS — I5022 Chronic systolic (congestive) heart failure: Secondary | ICD-10-CM | POA: Diagnosis not present

## 2023-12-10 DIAGNOSIS — Z96611 Presence of right artificial shoulder joint: Secondary | ICD-10-CM | POA: Diagnosis not present

## 2023-12-10 DIAGNOSIS — Z471 Aftercare following joint replacement surgery: Secondary | ICD-10-CM | POA: Diagnosis not present

## 2023-12-10 DIAGNOSIS — E1122 Type 2 diabetes mellitus with diabetic chronic kidney disease: Secondary | ICD-10-CM | POA: Diagnosis not present

## 2023-12-10 DIAGNOSIS — E1165 Type 2 diabetes mellitus with hyperglycemia: Secondary | ICD-10-CM | POA: Diagnosis not present

## 2023-12-11 ENCOUNTER — Other Ambulatory Visit: Payer: Self-pay | Admitting: Orthopedic Surgery

## 2023-12-11 ENCOUNTER — Ambulatory Visit: Payer: Medicare PPO | Admitting: Physician Assistant

## 2023-12-11 ENCOUNTER — Ambulatory Visit (HOSPITAL_COMMUNITY)
Admission: RE | Admit: 2023-12-11 | Discharge: 2023-12-11 | Disposition: A | Discharge status: Home / Self Care | Payer: Medicare PPO | Source: Ambulatory Visit | Attending: Surgery | Admitting: Surgery

## 2023-12-11 ENCOUNTER — Ambulatory Visit (INDEPENDENT_AMBULATORY_CARE_PROVIDER_SITE_OTHER)
Admission: RE | Admit: 2023-12-11 | Discharge: 2023-12-11 | Disposition: A | Discharge status: Home / Self Care | Payer: Medicare PPO | Source: Ambulatory Visit | Attending: Surgery | Admitting: Surgery

## 2023-12-11 VITALS — BP 123/74 | HR 61 | Temp 97.6°F | Resp 20 | Ht 70.0 in | Wt 156.1 lb

## 2023-12-11 DIAGNOSIS — I739 Peripheral vascular disease, unspecified: Secondary | ICD-10-CM | POA: Diagnosis not present

## 2023-12-11 DIAGNOSIS — I6523 Occlusion and stenosis of bilateral carotid arteries: Secondary | ICD-10-CM | POA: Insufficient documentation

## 2023-12-11 DIAGNOSIS — M542 Cervicalgia: Secondary | ICD-10-CM

## 2023-12-11 LAB — VAS US ABI WITH/WO TBI

## 2023-12-11 NOTE — Progress Notes (Signed)
 HISTORY AND PHYSICAL     CC:  follow up. Requesting Provider:  Cristela Nest Key, *  HPI: This is a 83 y.o. male who is here today for follow up.  He does have hx of inferior MI and subsequent CABG in 1999, hx of heart failure with an EF of 30-35%. with AICD, htn, hyperlipidemia, CKD and PAF and is followed by [facility].   Pt was last seen on 07/22/2022.  At that time, he had a wound on his left foot that was getting better.  He was not having any pain.  He was not having any neurological symptoms.   The pt returns today for follow up.  He states he is getting over shoulder surgery and goes back for a check up next week.  His wife did not come with him today as she broke her ankle.   Pt denies any amaurosis fugax, speech difficulties, weakness, numbness, paralysis or clumsiness or facial droop.    Pt denies claudication, rest pain, or non healing wounds.  He states that they amputated the tip of the 2nd toe and thinks it was cancer.  This healed up nicely.    The pt is on a statin for cholesterol management.    The pt is not on an aspirin .    Other AC:  Eliquis  The pt is on hydralazine , ARB for hypertension.  The pt is not on medication for diabetes. Tobacco hx:  former  Pt does not have family hx of AAA.    Past Medical History:  Diagnosis Date   Abnormality of gait 02/07/2015   Arthritis    shoulders, pinched nerve around waist and back   Asthma    CAD (coronary artery disease)    Hx of Inf MI treated with CABG in 1999 // Myoview 11/16: mild inf-lat ischemia, EF 34, high risk // LHC 3/19: 3/3 bypass grafts patent   Carotid artery disease (HCC)    Followed by [facility] (Dr. Serene) // US  in 2/19: bilat ICA 40-59   Chronic systolic CHF (congestive heart failure) (HCC) 01/01/2010   Echo 12/30/2017: Mild LVH, EF 30-35, diffuse HK, grade 1 diastolic dysfunction, mildly reduced RVSF // Echo 04/12/2016:  EF 35-40, PASP 39, mild to moderate TR, mild LAE // Echo (12/14):   Base/mid inferior and inferolateral HK, EF 45%, mild LAE, mildly reduced RVSF   CKD (chronic kidney disease) Stage 3 B    followed by dr peoples q year lov 04-23-2022 on chart   Depression    HLP (hyperkeratosis lenticularis perstans)    HTN (hypertension)    ICD (implantable cardiac defibrillator)  BSx    [employer]   Ischemic cardiomyopathy    Myocardial infarction (HCC) 1999   Near syncope 09/21/2014   Orthostatic hypotension    PAF (paroxysmal atrial fibrillation) (HCC)    Peripheral vascular disease (HCC)    Wears glasses    Wears partial dentures    upper    Past Surgical History:  Procedure Laterality Date   AMPUTATION TOE Left 06/17/2023   Procedure: LEFT 2ND TOE AMPUTATION THRU PROXIMAL PHALANX;  Surgeon: Barton Drape, MD;  Location: [facility];  Service: Orthopedics;  Laterality: Left;   CARDIAC CATHETERIZATION N/A 12/11/2015   Procedure: Left Heart Cath and Coronary Angiography;  Surgeon: Rober Chroman, MD;  Location: [facility] INVASIVE CV LAB;  Service: Cardiovascular;  Laterality: N/A;   CARDIAC DEFIBRILLATOR PLACEMENT     [employer]   CARDIAC DEFIBRILLATOR PLACEMENT  04/08/2016  NOT MRI SAFE (DOCUMENT SCANNED IN SYSTEM   COLONOSCOPY  10/06/2007   Mild sigmoid diverticulosis. Small internal hemorrhoids.    CORONARY ARTERY BYPASS GRAFT     ELBOW SURGERY Left    plastic bone replacement   HERNIA REPAIR     x2   LEFT HEART CATH AND CORS/GRAFTS ANGIOGRAPHY N/A 01/07/2018   Procedure: LEFT HEART CATH AND CORS/GRAFTS ANGIOGRAPHY;  Surgeon: Verlin Lonni BIRCH, MD;  Location: [facility] INVASIVE CV LAB;  Service: Cardiovascular;  Laterality: N/A;   PERMANENT PACEMAKER GENERATOR CHANGE N/A 11/25/2013   Procedure: PERMANENT PACEMAKER GENERATOR CHANGE;  Surgeon: Elspeth JAYSON Sage, MD;  Location: [facility] CATH LAB;  Service: Cardiovascular;  Laterality: N/A;    REVERSE SHOULDER ARTHROPLASTY Right 10/16/2023   Procedure: REVERSE SHOULDER ARTHROPLASTY;  Surgeon:  Kay Kemps, MD;  Location: [facility] ORS;  Service: Orthopedics;  Laterality: Right;  100   ROTATOR CUFF REPAIR Right 2006   SINUS EXPLORATION  1987   ULNAR NERVE TRANSPOSITION Left 07/19/2019   Procedure: Ulnar nerve release at the elbow with anterior transposition and flexor pronator lengthening, anterior interosseous nerve transfer to the deep motor branch of the ulnar nerve at the forearm, flexor digitorum profundus tendon transfer of the ring and small to middle fingers, ulnar nerve release at the wrist, Guyons canal;  Surgeon: Camella Fallow, MD;  Location: [facility] OR;  Service: Orthoped   ULTRASOUND GUIDANCE FOR VASCULAR ACCESS  01/07/2018   Procedure: Ultrasound Guidance For Vascular Access;  Surgeon: Verlin Lonni BIRCH, MD;  Location: [facility] INVASIVE CV LAB;  Service: Cardiovascular;;    Allergies  Allergen Reactions   Tramadol Other (See Comments)    Unknown Reaction -  Unsure of reaction     Current Outpatient Medications  Medication Sig Dispense Refill   amLODipine  (NORVASC ) 2.5 MG tablet Take 2 tablets (5 mg total) by mouth daily. (Patient taking differently: Take 2.5 mg by mouth daily.) 180 tablet 3   buPROPion  (WELLBUTRIN  SR) 150 MG 12 hr tablet Take 300 mg by mouth daily.     ELIQUIS  2.5 MG TABS tablet TAKE 1 TABLET BY MOUTH TWICE A DAY 180 tablet 1   escitalopram  (LEXAPRO ) 10 MG tablet Take 10 mg by mouth daily.     famotidine  (PEPCID ) 20 MG tablet Take 20 mg by mouth 2 (two) times daily.     gabapentin  (NEURONTIN ) 300 MG capsule Take 1,200 mg by mouth at bedtime.     hydrALAZINE  (APRESOLINE ) 25 MG tablet Take 1 tablet twice daily as needed for systolic blood pressure (top number) greater than 160. 180 tablet 3   HYDROcodone -acetaminophen  (NORCO) 5-325 MG tablet Take 1 tablet by mouth every 6 (six) hours as needed for moderate pain (pain score 4-6). 20 tablet 0   nitroGLYCERIN  (NITROSTAT ) 0.4 MG SL tablet Place 1 tablet (0.4 mg total) under the tongue every 5 (five) minutes as needed  for chest pain. 25 tablet 11   rosuvastatin  (CRESTOR ) 40 MG tablet TAKE 1 TABLET BY MOUTH EVERY DAY 90 tablet 2   sacubitril -valsartan  (ENTRESTO ) 24-26 MG Take 1/2 tablet by mouth 2 times daily. 90 tablet 3   traZODone  (DESYREL ) 50 MG tablet Take 50 mg by mouth at bedtime.     No current facility-administered medications for this visit.    Family History  Problem Relation Age of Onset   Heart disease Mother    Heart attack Mother    Heart disease Father    Heart attack Father    Heart disease Brother  Heart attack Brother     Social History   Socioeconomic History   Marital status: Married    Spouse name: Not on file   Number of children: 1   Years of education: HS +   Highest education level: Not on file  Occupational History   Occupation: retired  Tobacco Use   Smoking status: Former    Types: Pipe    Quit date: 09/19/1999    Years since quitting: 24.2    Passive exposure: Never   Smokeless tobacco: Never   Tobacco comments:    quit in 1998  Vaping Use   Vaping status: Never Used  Substance and Sexual Activity   Alcohol  use: No    Alcohol /week: 0.0 standard drinks of alcohol     Comment: recovering alcoholic, quit 39yrs ago (2000)   Drug use: No   Sexual activity: Not on file  Other Topics Concern   Not on file  Social History Narrative   Lives at home w/ his wife.   Patient is right handed.   Patient drinks about 3 cups of soda daily.   Social Drivers of Corporate Investment Banker Strain: Not on file  Food Insecurity: No Food Insecurity (10/16/2023)   Hunger Vital Sign    Worried About Running Out of Food in the Last Year: Never true    Ran Out of Food in the Last Year: Never true  Transportation Needs: No Transportation Needs (10/16/2023)   PRAPARE - Administrator, Civil Service (Medical): No    Lack of Transportation (Non-Medical): No  Physical Activity: Not on file  Stress: Not on file  Social Connections: Not on file  Intimate  Partner Violence: Not At Risk (10/16/2023)   Humiliation, Afraid, Rape, and Kick questionnaire    Fear of Current or Ex-Partner: No    Emotionally Abused: No    Physically Abused: No    Sexually Abused: No     REVIEW OF SYSTEMS:   [X]  denotes positive finding, [ ]  denotes negative finding Cardiac  Comments:  Chest pain or chest pressure:    Shortness of breath upon exertion:    Short of breath when lying flat:    Irregular heart rhythm:        Vascular    Pain in calf, thigh, or hip brought on by ambulation:    Pain in feet at night that wakes you up from your sleep:     Blood clot in your veins:    Leg swelling:         Pulmonary    Oxygen  at home:    Wheezing:         Neurologic    Sudden weakness in arms or legs:     Sudden numbness in arms or legs:     Sudden onset of difficulty speaking or understanding others    Temporary loss of vision in one eye:     Problems with dizziness:         Gastrointestinal    Blood in stool:     Vomited blood:         Genitourinary    Burning when urinating:     Blood in urine:        Psychiatric    Major depression:         Hematologic    Bleeding problems:    Problems with blood clotting too easily:        Skin    Rashes or ulcers:  Constitutional    Fever or chills:      PHYSICAL EXAMINATION:  Today's Vitals   12/11/23 1446  BP: 123/74  Pulse: 61  Resp: 20  Temp: 97.6 F (36.4 C)  TempSrc: Temporal  SpO2: 94%  Weight: 156 lb 1.6 oz (70.8 kg)  Height: 5' 10 (1.778 m)   Body mass index is 22.4 kg/m.   General:  WDWN in NAD; vital signs documented above Gait: Not observed HENT: WNL, normocephalic Pulmonary: normal non-labored breathing  Cardiac: regular HR;  without carotid bruits Abdomen: soft, NT, aortic pulse is not palpable Skin: without rashes Vascular Exam/Pulses:  Right Left  Radial 2+ (normal) 2+ (normal)  DP Brisk doppler Brisk doppler  PT Brisk doppler Brisk doppler  Peroneal  Brisk doppler Brisk doppler   Extremities: left 2nd toe tip amputation healed nicely.  Otherwise, no wounds present Musculoskeletal: no muscle wasting or atrophy  Neurologic: A&O X 3;  speech is fluent/normal; moving all extremities equally  Psychiatric:  The pt has Normal affect.   Non-Invasive Vascular Imaging:   ABI's/TBI's on 12/11/2023: Right:  [place]/0.64 - great toe pressure:  73 Left:  [place]/0.53 - great toe pressure:  60  Carotid Duplex on 12/11/2023: Right:  40-59% ICA stenosis Left:  40-59% ICA stenosis  Previous ABI's/TBI's on 07/01/2022: Right:  [place]/0.60 - great toe pressure:  86 Left:  [place]/0.85 - great toe pressure:  123  Previous Carotid duplex on 07/01/2022: Right: 40-59% ICA stenosis Left:   40-59% ICA stenosis    ASSESSMENT/PLAN:: 83 y.o. male here for follow up for PAD and carotid stenosis   PAD -pt with brisk doppler flow bilateral feet.  Slight decrease in toe pressure on the left  -pt does not have rest pain, claudication, non healing wounds. -continue graduated walking program and protect his feet. -pt will f/u in one year with ABI.  He knows to call sooner should he develop any rest pain or non healing wounds.  Carotid stenosis -duplex today reveals bilateral 40-59% ICA stenosis. Pt remains asymptomatic -discussed s/s of stroke with pt and he understands should he develop any of these sx, he will go to the nearest ER or call 911. -pt will f/u in one year with carotid duplex  -continue statin.  He is on Eliquis    Lucie Apt, [facility] [phone]  Clinic MD:   [place]

## 2023-12-16 DIAGNOSIS — N1832 Chronic kidney disease, stage 3b: Secondary | ICD-10-CM | POA: Diagnosis not present

## 2023-12-16 DIAGNOSIS — D849 Immunodeficiency, unspecified: Secondary | ICD-10-CM | POA: Diagnosis not present

## 2023-12-16 DIAGNOSIS — I951 Orthostatic hypotension: Secondary | ICD-10-CM | POA: Diagnosis not present

## 2023-12-16 DIAGNOSIS — E1165 Type 2 diabetes mellitus with hyperglycemia: Secondary | ICD-10-CM | POA: Diagnosis not present

## 2023-12-16 DIAGNOSIS — I130 Hypertensive heart and chronic kidney disease with heart failure and stage 1 through stage 4 chronic kidney disease, or unspecified chronic kidney disease: Secondary | ICD-10-CM | POA: Diagnosis not present

## 2023-12-16 DIAGNOSIS — E1122 Type 2 diabetes mellitus with diabetic chronic kidney disease: Secondary | ICD-10-CM | POA: Diagnosis not present

## 2023-12-16 DIAGNOSIS — Z471 Aftercare following joint replacement surgery: Secondary | ICD-10-CM | POA: Diagnosis not present

## 2023-12-16 DIAGNOSIS — I5022 Chronic systolic (congestive) heart failure: Secondary | ICD-10-CM | POA: Diagnosis not present

## 2023-12-16 DIAGNOSIS — Z96611 Presence of right artificial shoulder joint: Secondary | ICD-10-CM | POA: Diagnosis not present

## 2023-12-25 NOTE — Discharge Instructions (Addendum)
Myelogram Discharge Instructions  Go home and rest quietly as needed. You may resume normal activities; however, do not exert yourself strongly or do any heavy lifting today and tomorrow.   DO NOT drive today.    You may resume your normal diet and medications unless otherwise indicated. Drink lots of extra fluids today and tomorrow.   The incidence of headache, nausea, or vomiting is about 5% (one in 20 patients).  If you develop a headache, lie flat for 24 hours and drink plenty of fluids until the headache goes away.  Caffeinated beverages may be helpful. If when you get up you still have a headache when standing, go back to bed and force fluids for another 24 hours.   If you develop severe nausea and vomiting or a headache that does not go away with the flat bedrest after 48 hours, please call [phone].   Call your physician for a follow-up appointment.  The results of your myelogram will be sent directly to your physician by the following day.  If you have any questions or if complications develop after you arrive home, please call [phone].  Discharge instructions have been explained to the patient.  The patient, or the person responsible for the patient, fully understands these instructions.  YOU MAY RESUME YOUR ELIIQUIS TOMORROW   Thank you for visiting our office today.

## 2023-12-28 ENCOUNTER — Ambulatory Visit
Admission: RE | Admit: 2023-12-28 | Discharge: 2023-12-28 | Disposition: A | Discharge status: Home / Self Care | Payer: Medicare PPO | Source: Ambulatory Visit | Attending: Orthopedic Surgery | Admitting: Orthopedic Surgery

## 2023-12-28 DIAGNOSIS — M542 Cervicalgia: Secondary | ICD-10-CM

## 2023-12-28 DIAGNOSIS — M47812 Spondylosis without myelopathy or radiculopathy, cervical region: Secondary | ICD-10-CM | POA: Diagnosis not present

## 2023-12-28 DIAGNOSIS — M4802 Spinal stenosis, cervical region: Secondary | ICD-10-CM | POA: Diagnosis not present

## 2023-12-28 MED ORDER — DIAZEPAM 5 MG PO TABS
5.0000 mg | ORAL_TABLET | Freq: Once | ORAL | Status: AC
  Administered 2023-12-28: 5 mg via ORAL

## 2023-12-28 MED ORDER — ONDANSETRON HCL 4 MG/2ML IJ SOLN: 4.0000 mg | Freq: Once | INTRAMUSCULAR | Status: DC | PRN

## 2023-12-28 MED ORDER — MEPERIDINE HCL 50 MG/ML IJ SOLN: 50.0000 mg | Freq: Once | INTRAMUSCULAR | Status: DC | PRN

## 2023-12-28 MED ORDER — IOPAMIDOL (ISOVUE-M 300) INJECTION 61%
10.0000 mL | Freq: Once | INTRAMUSCULAR | Status: AC | PRN
Start: 2023-12-28 — End: 2023-12-28
  Administered 2023-12-28: 10 mL via INTRATHECAL

## 2024-01-01 ENCOUNTER — Ambulatory Visit (INDEPENDENT_AMBULATORY_CARE_PROVIDER_SITE_OTHER): Payer: Medicare Other

## 2024-01-01 DIAGNOSIS — I255 Ischemic cardiomyopathy: Secondary | ICD-10-CM

## 2024-01-04 LAB — CUP PACEART REMOTE DEVICE CHECK
Battery Remaining Longevity: 18 mo
Battery Remaining Percentage: 23 %
Brady Statistic RA Percent Paced: 74 %
Brady Statistic RV Percent Paced: 0 %
Date Time Interrogation Session: 20250228004100
HighPow Impedance: 43 Ohm
Implantable Lead Connection Status: 753985
Implantable Lead Connection Status: 753985
Implantable Lead Implant Date: 20060512
Implantable Lead Implant Date: 20060512
Implantable Lead Location: 753859
Implantable Lead Location: 753860
Implantable Lead Model: 158
Implantable Lead Model: 5076
Implantable Lead Serial Number: 159477
Implantable Pulse Generator Implant Date: 20150123
Lead Channel Impedance Value: 370 Ohm
Lead Channel Impedance Value: 526 Ohm
Lead Channel Pacing Threshold Amplitude: 0.8 V
Lead Channel Pacing Threshold Amplitude: 0.9 V
Lead Channel Pacing Threshold Pulse Width: 0.4 ms
Lead Channel Pacing Threshold Pulse Width: 0.4 ms
Lead Channel Setting Pacing Amplitude: 2 V
Lead Channel Setting Pacing Amplitude: 2.4 V
Lead Channel Setting Pacing Pulse Width: 0.4 ms
Lead Channel Setting Sensing Sensitivity: 0.6 mV
Pulse Gen Serial Number: 112776

## 2024-01-07 DIAGNOSIS — L821 Other seborrheic keratosis: Secondary | ICD-10-CM | POA: Diagnosis not present

## 2024-01-07 DIAGNOSIS — L814 Other melanin hyperpigmentation: Secondary | ICD-10-CM | POA: Diagnosis not present

## 2024-01-07 DIAGNOSIS — D485 Neoplasm of uncertain behavior of skin: Secondary | ICD-10-CM | POA: Diagnosis not present

## 2024-01-07 DIAGNOSIS — L570 Actinic keratosis: Secondary | ICD-10-CM | POA: Diagnosis not present

## 2024-01-07 DIAGNOSIS — L578 Other skin changes due to chronic exposure to nonionizing radiation: Secondary | ICD-10-CM | POA: Diagnosis not present

## 2024-01-08 DIAGNOSIS — N1832 Chronic kidney disease, stage 3b: Secondary | ICD-10-CM | POA: Diagnosis not present

## 2024-01-08 DIAGNOSIS — E785 Hyperlipidemia, unspecified: Secondary | ICD-10-CM | POA: Diagnosis not present

## 2024-01-08 DIAGNOSIS — I255 Ischemic cardiomyopathy: Secondary | ICD-10-CM | POA: Diagnosis not present

## 2024-01-08 DIAGNOSIS — I252 Old myocardial infarction: Secondary | ICD-10-CM | POA: Diagnosis not present

## 2024-01-08 DIAGNOSIS — I10 Essential (primary) hypertension: Secondary | ICD-10-CM | POA: Diagnosis not present

## 2024-01-08 DIAGNOSIS — D638 Anemia in other chronic diseases classified elsewhere: Secondary | ICD-10-CM | POA: Diagnosis not present

## 2024-01-08 DIAGNOSIS — I480 Paroxysmal atrial fibrillation: Secondary | ICD-10-CM | POA: Diagnosis not present

## 2024-01-08 DIAGNOSIS — Z95810 Presence of automatic (implantable) cardiac defibrillator: Secondary | ICD-10-CM | POA: Diagnosis not present

## 2024-01-19 ENCOUNTER — Encounter: Payer: Self-pay | Admitting: Internal Medicine

## 2024-01-19 DIAGNOSIS — G5621 Lesion of ulnar nerve, right upper limb: Secondary | ICD-10-CM | POA: Diagnosis not present

## 2024-01-19 DIAGNOSIS — G5622 Lesion of ulnar nerve, left upper limb: Secondary | ICD-10-CM | POA: Diagnosis not present

## 2024-01-19 NOTE — Progress Notes (Unsigned)
 Electrophysiology Office Note:   Date:  01/20/2024  ID:  Connor Wilkerson, DOB 1941-07-27, MRN [id number]  Primary Cardiologist: Nanetta Batty, MD Primary Heart Failure: None Electrophysiologist: Sherryl Manges, MD       History of Present Illness:   Connor Wilkerson is a 83 y.o. male with h/o AF, HFrEF / ICM s/p ICD, HTN, HLD, CAD s/p CABG (1999), PVD, non-obs carotid disease, CKD IIIb, & chronic back pain seen today for routine electrophysiology followup.   He had shoulder surgery on 10/16/2023 and reports he isn't doing great with rehab.  He notes he occasionally stumbles with walking down the hall. He depends on Meals on Wheels for food.   Since last being seen in our clinic the patient reports he has no device related concerns.   He denies chest pain, palpitations, dyspnea, PND, orthopnea, nausea, vomiting, dizziness, syncope, edema, weight gain, or early satiety.   Review of systems complete and found to be negative unless listed in HPI.   EP Information / Studies Reviewed:    EKG is not ordered today. EKG from 09/10/23 reviewed which showed SR 68 bpm      ICD Interrogation-  reviewed in detail today,  See PACEART report.  Device History: Field seismologist ICD implanted 03/14/05 for ICM Generator Change: 11/25/13 History of appropriate therapy: No History of AAD therapy: No   Studies:  LHC 01/2018 > severe triple vessel CAD s/p 3V CABG with 3/3 patent bypass grafts, severe stenosis mid LAD, mid Circ chronically occluded, RCA is chronically occluded just beyond the ostium > medical mgmt recommended  ECHO 04/2022 > LVEF 55-60%, LA mildly dilated     Arrhythmia / AAD AF     Risk Assessment/Calculations:    CHA2DS2-VASc Score = 5   This indicates a 7.2% annual risk of stroke. The patient's score is based upon: CHF History: 1 HTN History: 1 Diabetes History: 0 Stroke History: 0 Vascular Disease History: 1 Age Score: 2 Gender Score: 0              Physical Exam:   VS:  BP 118/64   Pulse 60   Ht 5\' 10"  (1.778 m)   Wt 154 lb 3.2 oz (69.9 kg)   SpO2 94%   BMI 22.13 kg/m    Wt Readings from Last 3 Encounters:  01/20/24 154 lb 3.2 oz (69.9 kg)  12/11/23 156 lb 1.6 oz (70.8 kg)  10/16/23 153 lb 10.6 oz (69.7 kg)     GEN: thin elderly male in no acute distress NECK: No JVD; No carotid bruits CARDIAC: Regular rate and rhythm, no murmurs, rubs, gallops, no tethering of device or edema RESPIRATORY:  Clear to auscultation without rales, wheezing or rhonchi  ABDOMEN: Soft, non-tender, non-distended EXTREMITIES:  No edema; No deformity   ASSESSMENT AND PLAN:    Chronic Systolic Dysfunction / ICM s/p Environmental manager dual chamber ICD  -euvolemic today -Stable on an appropriate medical regimen -Normal ICD function -See Pace Art report -No changes today -BP has limited GDMT in past    Paroxysmal Atrial Fibrillation  CHA2DS2-VASc 5 -<1% burden on device  Secondary Hypercoagulable State  -continue Eliquis 2.5 mg BID, dose reviewed and appropriate by age/Cr  Hx Hypertension   Orthostatic Hx, Syncope -well controlled on current regimen   -encourage adequate hydration   CAD -no anginal symptoms   PVD Carotid Artery Disease  -follows with [facility]VS  CKD  -follows with Nephrology   Disposition:   Follow up with  Dr. Graciela Husbands in 12 months   Signed, Canary Brim, NP-C, AGACNP-BC [facility] - Electrophysiology  01/20/2024, 5:12 PM

## 2024-01-20 ENCOUNTER — Ambulatory Visit: Attending: Pulmonary Disease | Admitting: Pulmonary Disease

## 2024-01-20 ENCOUNTER — Encounter: Payer: Self-pay | Admitting: Pulmonary Disease

## 2024-01-20 VITALS — BP 118/64 | HR 60 | Ht 70.0 in | Wt 154.2 lb

## 2024-01-20 DIAGNOSIS — I5022 Chronic systolic (congestive) heart failure: Secondary | ICD-10-CM | POA: Diagnosis not present

## 2024-01-20 DIAGNOSIS — I480 Paroxysmal atrial fibrillation: Secondary | ICD-10-CM | POA: Diagnosis not present

## 2024-01-20 DIAGNOSIS — Z95810 Presence of automatic (implantable) cardiac defibrillator: Secondary | ICD-10-CM

## 2024-01-20 DIAGNOSIS — I10 Essential (primary) hypertension: Secondary | ICD-10-CM

## 2024-01-20 DIAGNOSIS — I2510 Atherosclerotic heart disease of native coronary artery without angina pectoris: Secondary | ICD-10-CM

## 2024-01-20 DIAGNOSIS — I255 Ischemic cardiomyopathy: Secondary | ICD-10-CM

## 2024-01-20 DIAGNOSIS — I739 Peripheral vascular disease, unspecified: Secondary | ICD-10-CM | POA: Diagnosis not present

## 2024-01-20 NOTE — Patient Instructions (Signed)
 Medication Instructions:  Call your primary care provider to discuss possibly decreasing some of your medications. *If you need a refill on your cardiac medications before your next appointment, please call your pharmacy*  Lab Work: None ordered If you have labs (blood work) drawn today and your tests are completely normal, you will receive your results only by: MyChart Message (if you have MyChart) OR A paper copy in the mail If you have any lab test that is abnormal or we need to change your treatment, we will call you to review the results.  Follow-Up: At [facility], you and your health needs are our priority.  As part of our continuing mission to provide you with exceptional heart care, we have created designated Provider Care Teams.  These Care Teams include your primary Cardiologist (physician) and Advanced Practice Providers (APPs -  Physician Assistants and Nurse Practitioners) who all work together to provide you with the care you need, when you need it.  Your next appointment:   1 year(s)  Provider:   You will see one of the following Advanced Practice Providers on your designated Care Team:   Francis Dowse, Charlott Holler [address] [place], [place] Sherie Don, NP Canary Brim, NP

## 2024-01-27 DIAGNOSIS — Z9181 History of falling: Secondary | ICD-10-CM | POA: Diagnosis not present

## 2024-01-27 DIAGNOSIS — Z0000 Encounter for general adult medical examination without abnormal findings: Secondary | ICD-10-CM | POA: Diagnosis not present

## 2024-01-29 NOTE — Addendum Note (Signed)
 Addended by: Elease Etienne A on: 01/29/2024 09:30 AM   Modules accepted: Orders

## 2024-01-29 NOTE — Progress Notes (Signed)
 Remote ICD transmission.

## 2024-02-04 DIAGNOSIS — M5412 Radiculopathy, cervical region: Secondary | ICD-10-CM | POA: Diagnosis not present

## 2024-02-04 DIAGNOSIS — M79641 Pain in right hand: Secondary | ICD-10-CM | POA: Diagnosis not present

## 2024-02-18 DIAGNOSIS — C44629 Squamous cell carcinoma of skin of left upper limb, including shoulder: Secondary | ICD-10-CM | POA: Diagnosis not present

## 2024-02-25 DIAGNOSIS — D485 Neoplasm of uncertain behavior of skin: Secondary | ICD-10-CM | POA: Diagnosis not present

## 2024-03-23 ENCOUNTER — Other Ambulatory Visit: Payer: Self-pay | Admitting: Internal Medicine

## 2024-03-29 DIAGNOSIS — C44622 Squamous cell carcinoma of skin of right upper limb, including shoulder: Secondary | ICD-10-CM | POA: Diagnosis not present

## 2024-04-01 ENCOUNTER — Ambulatory Visit (INDEPENDENT_AMBULATORY_CARE_PROVIDER_SITE_OTHER): Payer: Medicare Other

## 2024-04-01 DIAGNOSIS — I255 Ischemic cardiomyopathy: Secondary | ICD-10-CM

## 2024-04-01 LAB — CUP PACEART REMOTE DEVICE CHECK
Battery Remaining Longevity: 18 mo
Battery Remaining Percentage: 20 %
Brady Statistic RA Percent Paced: 87 %
Brady Statistic RV Percent Paced: 0 %
Date Time Interrogation Session: 20250530004100
HighPow Impedance: 42 Ohm
Implantable Lead Connection Status: 753985
Implantable Lead Connection Status: 753985
Implantable Lead Implant Date: 20060512
Implantable Lead Implant Date: 20060512
Implantable Lead Location: 753859
Implantable Lead Location: 753860
Implantable Lead Model: 158
Implantable Lead Model: 5076
Implantable Lead Serial Number: 159477
Implantable Pulse Generator Implant Date: 20150123
Lead Channel Impedance Value: 365 Ohm
Lead Channel Impedance Value: 526 Ohm
Lead Channel Pacing Threshold Amplitude: 1 V
Lead Channel Pacing Threshold Amplitude: 1.2 V
Lead Channel Pacing Threshold Pulse Width: 0.4 ms
Lead Channel Pacing Threshold Pulse Width: 0.4 ms
Lead Channel Setting Pacing Amplitude: 2 V
Lead Channel Setting Pacing Amplitude: 2.4 V
Lead Channel Setting Pacing Pulse Width: 0.4 ms
Lead Channel Setting Sensing Sensitivity: 0.6 mV
Pulse Gen Serial Number: 112776

## 2024-04-11 ENCOUNTER — Ambulatory Visit: Payer: Self-pay | Admitting: Cardiology

## 2024-05-02 ENCOUNTER — Other Ambulatory Visit: Payer: Self-pay | Admitting: Internal Medicine

## 2024-05-12 DIAGNOSIS — C44622 Squamous cell carcinoma of skin of right upper limb, including shoulder: Secondary | ICD-10-CM | POA: Diagnosis not present

## 2024-05-12 DIAGNOSIS — L570 Actinic keratosis: Secondary | ICD-10-CM | POA: Diagnosis not present

## 2024-05-18 NOTE — Progress Notes (Signed)
 Remote ICD transmission.

## 2024-06-02 DIAGNOSIS — R209 Unspecified disturbances of skin sensation: Secondary | ICD-10-CM | POA: Insufficient documentation

## 2024-06-02 DIAGNOSIS — M1990 Unspecified osteoarthritis, unspecified site: Secondary | ICD-10-CM | POA: Insufficient documentation

## 2024-06-02 DIAGNOSIS — M5412 Radiculopathy, cervical region: Secondary | ICD-10-CM | POA: Insufficient documentation

## 2024-06-02 DIAGNOSIS — M25519 Pain in unspecified shoulder: Secondary | ICD-10-CM | POA: Insufficient documentation

## 2024-06-02 DIAGNOSIS — G5620 Lesion of ulnar nerve, unspecified upper limb: Secondary | ICD-10-CM | POA: Insufficient documentation

## 2024-06-06 ENCOUNTER — Telehealth: Payer: Self-pay | Admitting: Neurology

## 2024-06-06 ENCOUNTER — Ambulatory Visit: Admitting: Neurology

## 2024-06-06 NOTE — Telephone Encounter (Signed)
 Pt wife called back to reschedule appt   Appt Scheduled

## 2024-06-06 NOTE — Telephone Encounter (Signed)
 LVM and sent mychart msg informing pt of need to reschedule 06/06/24 appt - MD out

## 2024-06-15 ENCOUNTER — Other Ambulatory Visit (HOSPITAL_COMMUNITY): Payer: Self-pay

## 2024-06-15 ENCOUNTER — Other Ambulatory Visit: Payer: Self-pay

## 2024-06-15 ENCOUNTER — Other Ambulatory Visit: Payer: Self-pay | Admitting: Cardiovascular Disease

## 2024-06-15 DIAGNOSIS — I480 Paroxysmal atrial fibrillation: Secondary | ICD-10-CM

## 2024-06-15 MED ORDER — APIXABAN 2.5 MG PO TABS
2.5000 mg | ORAL_TABLET | Freq: Two times a day (BID) | ORAL | 1 refills | Status: DC
  Filled 2024-06-15 (×2): qty 180, 90d supply, fill #0

## 2024-06-15 NOTE — Telephone Encounter (Signed)
*  STAT* If patient is at the pharmacy, call can be transferred to refill team.   1. Which medications need to be refilled? (please list name of each medication and dose if known) ELIQUIS  2.5 MG TABS tablet    sacubitril -valsartan  (ENTRESTO ) 24-26 MG   2. Would you like to learn more about the convenience, safety, & potential cost savings by using the [facility]? No    3. Are you open to using the [facility] (Type [facility]. ). No   4. Which pharmacy/location (including street and city if local pharmacy) is medication to be sent to? [id number] - [place], [place] - [address]     5. Do they need a 30 day or 90 day supply? 90 day   Pt has appt on 10/8  Pt is out of Entresto

## 2024-06-16 ENCOUNTER — Other Ambulatory Visit (HOSPITAL_COMMUNITY): Payer: Self-pay

## 2024-06-16 MED ORDER — APIXABAN 2.5 MG PO TABS: 2.5000 mg | ORAL_TABLET | Freq: Two times a day (BID) | ORAL | 1 refills | Status: DC

## 2024-06-16 NOTE — Telephone Encounter (Signed)
 Pt calling back, [facility]   [id number] - [place], [place] - [address] Phone: [phone]  Fax: [phone]

## 2024-06-16 NOTE — Telephone Encounter (Signed)
 RX for entresto  at Pharmacy. RX for Eliquis  routed to [facility]

## 2024-06-16 NOTE — Addendum Note (Signed)
 Addended by: BLUFORD, Chriss Mannan L on: 06/16/2024 12:55 PM   Modules accepted: Orders

## 2024-06-16 NOTE — Telephone Encounter (Signed)
 Prescription refill request for Eliquis  received. Indication: Afib  Last office visit: 01/20/24 [place])  Scr: 2.15 (10/07/23)  Age: 83 Weight: 69.9kg  Appropriate dose. Refill sent.

## 2024-06-22 DIAGNOSIS — M109 Gout, unspecified: Secondary | ICD-10-CM | POA: Diagnosis not present

## 2024-06-22 DIAGNOSIS — M65342 Trigger finger, left ring finger: Secondary | ICD-10-CM | POA: Diagnosis not present

## 2024-06-29 DIAGNOSIS — M5416 Radiculopathy, lumbar region: Secondary | ICD-10-CM | POA: Diagnosis not present

## 2024-06-29 DIAGNOSIS — M5412 Radiculopathy, cervical region: Secondary | ICD-10-CM | POA: Diagnosis not present

## 2024-07-01 ENCOUNTER — Ambulatory Visit (INDEPENDENT_AMBULATORY_CARE_PROVIDER_SITE_OTHER): Payer: Medicare Other

## 2024-07-01 DIAGNOSIS — I5022 Chronic systolic (congestive) heart failure: Secondary | ICD-10-CM | POA: Diagnosis not present

## 2024-07-02 LAB — CUP PACEART REMOTE DEVICE CHECK
Battery Remaining Longevity: 12 mo
Battery Remaining Percentage: 15 %
Brady Statistic RA Percent Paced: 86 %
Brady Statistic RV Percent Paced: 0 %
Date Time Interrogation Session: 20250829004100
HighPow Impedance: 39 Ohm
Implantable Lead Connection Status: 753985
Implantable Lead Connection Status: 753985
Implantable Lead Implant Date: 20060512
Implantable Lead Implant Date: 20060512
Implantable Lead Location: 753859
Implantable Lead Location: 753860
Implantable Lead Model: 158
Implantable Lead Model: 5076
Implantable Lead Serial Number: 159477
Implantable Pulse Generator Implant Date: 20150123
Lead Channel Impedance Value: 356 Ohm
Lead Channel Impedance Value: 517 Ohm
Lead Channel Pacing Threshold Amplitude: 1 V
Lead Channel Pacing Threshold Amplitude: 1.2 V
Lead Channel Pacing Threshold Pulse Width: 0.4 ms
Lead Channel Pacing Threshold Pulse Width: 0.4 ms
Lead Channel Setting Pacing Amplitude: 2 V
Lead Channel Setting Pacing Amplitude: 2.4 V
Lead Channel Setting Pacing Pulse Width: 0.4 ms
Lead Channel Setting Sensing Sensitivity: 0.6 mV
Pulse Gen Serial Number: 112776

## 2024-07-03 ENCOUNTER — Ambulatory Visit: Payer: Self-pay | Admitting: Cardiology

## 2024-07-04 ENCOUNTER — Encounter

## 2024-07-11 ENCOUNTER — Other Ambulatory Visit: Payer: Self-pay | Admitting: Family Medicine

## 2024-07-11 DIAGNOSIS — M5416 Radiculopathy, lumbar region: Secondary | ICD-10-CM

## 2024-07-11 NOTE — Progress Notes (Signed)
Remote ICD Transmission.

## 2024-07-19 ENCOUNTER — Ambulatory Visit: Admitting: Neurology

## 2024-07-20 NOTE — Discharge Instructions (Signed)

## 2024-07-21 ENCOUNTER — Inpatient Hospital Stay
Admission: RE | Admit: 2024-07-21 | Discharge: 2024-07-21 | Disposition: A | Discharge status: Home / Self Care | Source: Ambulatory Visit | Attending: Family Medicine

## 2024-07-21 ENCOUNTER — Ambulatory Visit
Admission: RE | Admit: 2024-07-21 | Discharge: 2024-07-21 | Disposition: A | Discharge status: Home / Self Care | Source: Ambulatory Visit | Attending: Family Medicine | Admitting: Family Medicine

## 2024-07-21 DIAGNOSIS — M5416 Radiculopathy, lumbar region: Secondary | ICD-10-CM

## 2024-07-21 DIAGNOSIS — M545 Low back pain, unspecified: Secondary | ICD-10-CM | POA: Diagnosis not present

## 2024-07-21 DIAGNOSIS — M48061 Spinal stenosis, lumbar region without neurogenic claudication: Secondary | ICD-10-CM | POA: Diagnosis not present

## 2024-07-21 DIAGNOSIS — M5126 Other intervertebral disc displacement, lumbar region: Secondary | ICD-10-CM | POA: Diagnosis not present

## 2024-07-21 DIAGNOSIS — M47816 Spondylosis without myelopathy or radiculopathy, lumbar region: Secondary | ICD-10-CM | POA: Diagnosis not present

## 2024-07-21 MED ORDER — MEPERIDINE HCL 50 MG/ML IJ SOLN: 50.0000 mg | Freq: Once | INTRAMUSCULAR | Status: DC | PRN

## 2024-07-21 MED ORDER — DIAZEPAM 5 MG PO TABS: 5.0000 mg | ORAL_TABLET | Freq: Once | ORAL | Status: DC

## 2024-07-21 MED ORDER — ONDANSETRON HCL 4 MG/2ML IJ SOLN: 4.0000 mg | Freq: Once | INTRAMUSCULAR | Status: DC | PRN

## 2024-07-21 MED ORDER — IOPAMIDOL (ISOVUE-M 200) INJECTION 41%
20.0000 mL | Freq: Once | INTRAMUSCULAR | Status: AC
Start: 2024-07-21 — End: 2024-07-21
  Administered 2024-07-21: 20 mL via INTRATHECAL

## 2024-08-01 DIAGNOSIS — L570 Actinic keratosis: Secondary | ICD-10-CM | POA: Diagnosis not present

## 2024-08-01 DIAGNOSIS — L821 Other seborrheic keratosis: Secondary | ICD-10-CM | POA: Diagnosis not present

## 2024-08-01 DIAGNOSIS — L578 Other skin changes due to chronic exposure to nonionizing radiation: Secondary | ICD-10-CM | POA: Diagnosis not present

## 2024-08-02 DIAGNOSIS — M549 Dorsalgia, unspecified: Secondary | ICD-10-CM | POA: Diagnosis not present

## 2024-08-02 DIAGNOSIS — R262 Difficulty in walking, not elsewhere classified: Secondary | ICD-10-CM | POA: Diagnosis not present

## 2024-08-06 ENCOUNTER — Telehealth: Payer: Self-pay | Admitting: Internal Medicine

## 2024-08-06 NOTE — Telephone Encounter (Signed)
 Received a phone call from patient's wife regarding a quarter sized lump three inches above the left nipple that has been present for ~6 months. Notably has a left chest wall CIED. The lump does not appear erythematous according to his wife. He has no subjective fevers or chills. No recent trauma to that area although he has fallen as recently as 1-2 weeks ago. No associated bruising or bleeding in the area of the reported abnormality.  He has an upcoming appointment with Dr. Court on 08/10/24. At that time this can be examined and an appropriate course of action can be taken.  I also informed her that if he begins to have signs of infection (fevers, chills) that he should present to the ED prior to that.

## 2024-08-10 ENCOUNTER — Inpatient Hospital Stay (HOSPITAL_COMMUNITY)
Admission: EM | Admit: 2024-08-10 | Discharge: 2024-08-17 | DRG: 673 | Disposition: A | Discharge status: Skilled Nursing Facility | Attending: Internal Medicine | Admitting: Internal Medicine

## 2024-08-10 ENCOUNTER — Ambulatory Visit: Admitting: Cardiovascular Disease

## 2024-08-10 ENCOUNTER — Encounter (HOSPITAL_COMMUNITY): Payer: Self-pay | Admitting: Internal Medicine

## 2024-08-10 ENCOUNTER — Other Ambulatory Visit: Payer: Self-pay

## 2024-08-10 ENCOUNTER — Emergency Department (HOSPITAL_COMMUNITY)

## 2024-08-10 ENCOUNTER — Telehealth: Payer: Self-pay | Admitting: Cardiovascular Disease

## 2024-08-10 ENCOUNTER — Inpatient Hospital Stay (HOSPITAL_COMMUNITY)

## 2024-08-10 DIAGNOSIS — I5042 Chronic combined systolic (congestive) and diastolic (congestive) heart failure: Secondary | ICD-10-CM | POA: Diagnosis present

## 2024-08-10 DIAGNOSIS — D6859 Other primary thrombophilia: Secondary | ICD-10-CM | POA: Diagnosis present

## 2024-08-10 DIAGNOSIS — E860 Dehydration: Secondary | ICD-10-CM | POA: Diagnosis present

## 2024-08-10 DIAGNOSIS — M48061 Spinal stenosis, lumbar region without neurogenic claudication: Secondary | ICD-10-CM | POA: Diagnosis present

## 2024-08-10 DIAGNOSIS — R638 Other symptoms and signs concerning food and fluid intake: Secondary | ICD-10-CM | POA: Diagnosis not present

## 2024-08-10 DIAGNOSIS — I4891 Unspecified atrial fibrillation: Secondary | ICD-10-CM | POA: Diagnosis not present

## 2024-08-10 DIAGNOSIS — M47814 Spondylosis without myelopathy or radiculopathy, thoracic region: Secondary | ICD-10-CM | POA: Diagnosis present

## 2024-08-10 DIAGNOSIS — I739 Peripheral vascular disease, unspecified: Secondary | ICD-10-CM | POA: Diagnosis not present

## 2024-08-10 DIAGNOSIS — E875 Hyperkalemia: Secondary | ICD-10-CM | POA: Diagnosis present

## 2024-08-10 DIAGNOSIS — Z6821 Body mass index (BMI) 21.0-21.9, adult: Secondary | ICD-10-CM

## 2024-08-10 DIAGNOSIS — D638 Anemia in other chronic diseases classified elsewhere: Secondary | ICD-10-CM | POA: Diagnosis present

## 2024-08-10 DIAGNOSIS — M47816 Spondylosis without myelopathy or radiculopathy, lumbar region: Secondary | ICD-10-CM | POA: Diagnosis not present

## 2024-08-10 DIAGNOSIS — D631 Anemia in chronic kidney disease: Secondary | ICD-10-CM | POA: Diagnosis present

## 2024-08-10 DIAGNOSIS — Z7901 Long term (current) use of anticoagulants: Secondary | ICD-10-CM

## 2024-08-10 DIAGNOSIS — I5022 Chronic systolic (congestive) heart failure: Secondary | ICD-10-CM | POA: Diagnosis not present

## 2024-08-10 DIAGNOSIS — M549 Dorsalgia, unspecified: Secondary | ICD-10-CM | POA: Diagnosis not present

## 2024-08-10 DIAGNOSIS — R41841 Cognitive communication deficit: Secondary | ICD-10-CM | POA: Diagnosis not present

## 2024-08-10 DIAGNOSIS — I132 Hypertensive heart and chronic kidney disease with heart failure and with stage 5 chronic kidney disease, or end stage renal disease: Secondary | ICD-10-CM | POA: Diagnosis present

## 2024-08-10 DIAGNOSIS — Z992 Dependence on renal dialysis: Secondary | ICD-10-CM | POA: Diagnosis not present

## 2024-08-10 DIAGNOSIS — N186 End stage renal disease: Secondary | ICD-10-CM | POA: Diagnosis not present

## 2024-08-10 DIAGNOSIS — I10 Essential (primary) hypertension: Secondary | ICD-10-CM | POA: Diagnosis not present

## 2024-08-10 DIAGNOSIS — G9340 Encephalopathy, unspecified: Secondary | ICD-10-CM | POA: Diagnosis present

## 2024-08-10 DIAGNOSIS — Z79899 Other long term (current) drug therapy: Secondary | ICD-10-CM | POA: Diagnosis not present

## 2024-08-10 DIAGNOSIS — R0609 Other forms of dyspnea: Secondary | ICD-10-CM | POA: Diagnosis not present

## 2024-08-10 DIAGNOSIS — G9349 Other encephalopathy: Secondary | ICD-10-CM | POA: Diagnosis present

## 2024-08-10 DIAGNOSIS — Z9181 History of falling: Secondary | ICD-10-CM

## 2024-08-10 DIAGNOSIS — M4186 Other forms of scoliosis, lumbar region: Secondary | ICD-10-CM | POA: Diagnosis not present

## 2024-08-10 DIAGNOSIS — D649 Anemia, unspecified: Secondary | ICD-10-CM | POA: Diagnosis not present

## 2024-08-10 DIAGNOSIS — N179 Acute kidney failure, unspecified: Secondary | ICD-10-CM | POA: Diagnosis not present

## 2024-08-10 DIAGNOSIS — R531 Weakness: Secondary | ICD-10-CM | POA: Diagnosis not present

## 2024-08-10 DIAGNOSIS — N184 Chronic kidney disease, stage 4 (severe): Secondary | ICD-10-CM | POA: Diagnosis not present

## 2024-08-10 DIAGNOSIS — N189 Chronic kidney disease, unspecified: Secondary | ICD-10-CM | POA: Diagnosis not present

## 2024-08-10 DIAGNOSIS — I255 Ischemic cardiomyopathy: Secondary | ICD-10-CM | POA: Diagnosis present

## 2024-08-10 DIAGNOSIS — Z85810 Personal history of malignant neoplasm of tongue: Secondary | ICD-10-CM | POA: Diagnosis not present

## 2024-08-10 DIAGNOSIS — R2689 Other abnormalities of gait and mobility: Secondary | ICD-10-CM | POA: Diagnosis not present

## 2024-08-10 DIAGNOSIS — F32A Depression, unspecified: Secondary | ICD-10-CM | POA: Diagnosis present

## 2024-08-10 DIAGNOSIS — Z96611 Presence of right artificial shoulder joint: Secondary | ICD-10-CM | POA: Diagnosis present

## 2024-08-10 DIAGNOSIS — Z87891 Personal history of nicotine dependence: Secondary | ICD-10-CM

## 2024-08-10 DIAGNOSIS — G8929 Other chronic pain: Secondary | ICD-10-CM | POA: Diagnosis present

## 2024-08-10 DIAGNOSIS — M5126 Other intervertebral disc displacement, lumbar region: Secondary | ICD-10-CM | POA: Diagnosis not present

## 2024-08-10 DIAGNOSIS — D539 Nutritional anemia, unspecified: Secondary | ICD-10-CM | POA: Diagnosis present

## 2024-08-10 DIAGNOSIS — M6281 Muscle weakness (generalized): Secondary | ICD-10-CM | POA: Diagnosis not present

## 2024-08-10 DIAGNOSIS — R109 Unspecified abdominal pain: Secondary | ICD-10-CM | POA: Diagnosis not present

## 2024-08-10 DIAGNOSIS — I110 Hypertensive heart disease with heart failure: Secondary | ICD-10-CM | POA: Diagnosis not present

## 2024-08-10 DIAGNOSIS — E785 Hyperlipidemia, unspecified: Secondary | ICD-10-CM | POA: Diagnosis not present

## 2024-08-10 DIAGNOSIS — J45909 Unspecified asthma, uncomplicated: Secondary | ICD-10-CM | POA: Diagnosis present

## 2024-08-10 DIAGNOSIS — G2581 Restless legs syndrome: Secondary | ICD-10-CM | POA: Diagnosis present

## 2024-08-10 DIAGNOSIS — I480 Paroxysmal atrial fibrillation: Secondary | ICD-10-CM | POA: Diagnosis present

## 2024-08-10 DIAGNOSIS — Z89422 Acquired absence of other left toe(s): Secondary | ICD-10-CM

## 2024-08-10 DIAGNOSIS — R197 Diarrhea, unspecified: Secondary | ICD-10-CM | POA: Diagnosis not present

## 2024-08-10 DIAGNOSIS — R64 Cachexia: Secondary | ICD-10-CM | POA: Diagnosis present

## 2024-08-10 DIAGNOSIS — Z95810 Presence of automatic (implantable) cardiac defibrillator: Secondary | ICD-10-CM

## 2024-08-10 DIAGNOSIS — M5136 Other intervertebral disc degeneration, lumbar region with discogenic back pain only: Secondary | ICD-10-CM | POA: Diagnosis not present

## 2024-08-10 DIAGNOSIS — M545 Low back pain, unspecified: Secondary | ICD-10-CM | POA: Diagnosis not present

## 2024-08-10 DIAGNOSIS — Z8249 Family history of ischemic heart disease and other diseases of the circulatory system: Secondary | ICD-10-CM

## 2024-08-10 DIAGNOSIS — I252 Old myocardial infarction: Secondary | ICD-10-CM

## 2024-08-10 DIAGNOSIS — Z951 Presence of aortocoronary bypass graft: Secondary | ICD-10-CM

## 2024-08-10 DIAGNOSIS — E43 Unspecified severe protein-calorie malnutrition: Secondary | ICD-10-CM | POA: Diagnosis not present

## 2024-08-10 DIAGNOSIS — I2510 Atherosclerotic heart disease of native coronary artery without angina pectoris: Secondary | ICD-10-CM | POA: Diagnosis present

## 2024-08-10 DIAGNOSIS — I130 Hypertensive heart and chronic kidney disease with heart failure and stage 1 through stage 4 chronic kidney disease, or unspecified chronic kidney disease: Secondary | ICD-10-CM | POA: Diagnosis not present

## 2024-08-10 DIAGNOSIS — M6282 Rhabdomyolysis: Secondary | ICD-10-CM | POA: Diagnosis present

## 2024-08-10 DIAGNOSIS — I5020 Unspecified systolic (congestive) heart failure: Secondary | ICD-10-CM | POA: Diagnosis not present

## 2024-08-10 DIAGNOSIS — D696 Thrombocytopenia, unspecified: Secondary | ICD-10-CM | POA: Diagnosis not present

## 2024-08-10 DIAGNOSIS — Z743 Need for continuous supervision: Secondary | ICD-10-CM | POA: Diagnosis not present

## 2024-08-10 DIAGNOSIS — R2681 Unsteadiness on feet: Secondary | ICD-10-CM | POA: Diagnosis not present

## 2024-08-10 DIAGNOSIS — K5730 Diverticulosis of large intestine without perforation or abscess without bleeding: Secondary | ICD-10-CM | POA: Diagnosis not present

## 2024-08-10 DIAGNOSIS — E872 Acidosis, unspecified: Secondary | ICD-10-CM | POA: Diagnosis not present

## 2024-08-10 DIAGNOSIS — D6869 Other thrombophilia: Secondary | ICD-10-CM | POA: Diagnosis present

## 2024-08-10 DIAGNOSIS — I5030 Unspecified diastolic (congestive) heart failure: Secondary | ICD-10-CM | POA: Diagnosis not present

## 2024-08-10 DIAGNOSIS — R5381 Other malaise: Secondary | ICD-10-CM | POA: Diagnosis present

## 2024-08-10 LAB — URINALYSIS, ROUTINE W REFLEX MICROSCOPIC
Bilirubin Urine: NEGATIVE
Glucose, UA: 50 mg/dL — AB
Ketones, ur: NEGATIVE mg/dL
Nitrite: NEGATIVE
Protein, ur: 30 mg/dL — AB
Specific Gravity, Urine: 1.012 (ref 1.005–1.030)
pH: 5 (ref 5.0–8.0)

## 2024-08-10 LAB — CBC WITH DIFFERENTIAL/PLATELET
Abs Immature Granulocytes: 0.02 K/uL (ref 0.00–0.07)
Basophils Absolute: 0.1 K/uL (ref 0.0–0.1)
Basophils Relative: 1 %
Eosinophils Absolute: 0.1 K/uL (ref 0.0–0.5)
Eosinophils Relative: 1 %
HCT: 24.4 % — ABNORMAL LOW (ref 39.0–52.0)
Hemoglobin: 8 g/dL — ABNORMAL LOW (ref 13.0–17.0)
Immature Granulocytes: 0 %
Lymphocytes Relative: 14 %
Lymphs Abs: 1.1 K/uL (ref 0.7–4.0)
MCH: 32.9 pg (ref 26.0–34.0)
MCHC: 32.8 g/dL (ref 30.0–36.0)
MCV: 100.4 fL — ABNORMAL HIGH (ref 80.0–100.0)
Monocytes Absolute: 0.8 K/uL (ref 0.1–1.0)
Monocytes Relative: 11 %
Neutro Abs: 5.5 K/uL (ref 1.7–7.7)
Neutrophils Relative %: 73 %
Platelets: 134 K/uL — ABNORMAL LOW (ref 150–400)
RBC: 2.43 MIL/uL — ABNORMAL LOW (ref 4.22–5.81)
RDW: 12.6 % (ref 11.5–15.5)
WBC: 7.6 K/uL (ref 4.0–10.5)
nRBC: 0 % (ref 0.0–0.2)

## 2024-08-10 LAB — COMPREHENSIVE METABOLIC PANEL WITH GFR
ALT: 32 U/L (ref 0–44)
AST: 33 U/L (ref 15–41)
Albumin: 3.7 g/dL (ref 3.5–5.0)
Alkaline Phosphatase: 50 U/L (ref 38–126)
Anion gap: 15 (ref 5–15)
BUN: 153 mg/dL — ABNORMAL HIGH (ref 8–23)
CO2: 11 mmol/L — ABNORMAL LOW (ref 22–32)
Calcium: 8.6 mg/dL — ABNORMAL LOW (ref 8.9–10.3)
Chloride: 110 mmol/L (ref 98–111)
Creatinine, Ser: 11.59 mg/dL — ABNORMAL HIGH (ref 0.61–1.24)
GFR, Estimated: 4 mL/min — ABNORMAL LOW (ref >60)
Glucose, Bld: 112 mg/dL — ABNORMAL HIGH (ref 70–99)
Potassium: 5.1 mmol/L (ref 3.5–5.1)
Sodium: 136 mmol/L (ref 135–145)
Total Bilirubin: 0.6 mg/dL (ref 0.0–1.2)
Total Protein: 6.2 g/dL — ABNORMAL LOW (ref 6.5–8.1)

## 2024-08-10 LAB — I-STAT CHEM 8, ED
BUN: >130 mg/dL — ABNORMAL HIGH (ref 8–23)
Calcium, Ion: 1.19 mmol/L (ref 1.15–1.40)
Chloride: 112 mmol/L — ABNORMAL HIGH (ref 98–111)
Creatinine, Ser: 12.4 mg/dL — ABNORMAL HIGH (ref 0.61–1.24)
Glucose, Bld: 108 mg/dL — ABNORMAL HIGH (ref 70–99)
HCT: 25 % — ABNORMAL LOW (ref 39.0–52.0)
Hemoglobin: 8.5 g/dL — ABNORMAL LOW (ref 13.0–17.0)
Potassium: 5.3 mmol/L — ABNORMAL HIGH (ref 3.5–5.1)
Sodium: 141 mmol/L (ref 135–145)
TCO2: 12 mmol/L — ABNORMAL LOW (ref 22–32)

## 2024-08-10 LAB — CREATININE, URINE, RANDOM: Creatinine, Urine: 68 mg/dL

## 2024-08-10 LAB — CK: Total CK: 1210 U/L — ABNORMAL HIGH (ref 49–397)

## 2024-08-10 LAB — LACTATE DEHYDROGENASE: LDH: 225 U/L — ABNORMAL HIGH (ref 98–192)

## 2024-08-10 LAB — BRAIN NATRIURETIC PEPTIDE: B Natriuretic Peptide: 96.2 pg/mL (ref 0.0–100.0)

## 2024-08-10 LAB — TROPONIN I (HIGH SENSITIVITY)
Troponin I (High Sensitivity): 95 ng/L — ABNORMAL HIGH (ref <18)
Troponin I (High Sensitivity): 81 ng/L — ABNORMAL HIGH (ref <18)

## 2024-08-10 LAB — TYPE AND SCREEN
ABO/RH(D): O POS
Antibody Screen: NEGATIVE

## 2024-08-10 LAB — IMMATURE PLATELET FRACTION: Immature Platelet Fraction: 4.1 % (ref 1.2–8.6)

## 2024-08-10 LAB — CBG MONITORING, ED: Glucose-Capillary: 112 mg/dL — ABNORMAL HIGH (ref 70–99)

## 2024-08-10 LAB — TECHNOLOGIST SMEAR REVIEW: Plt Morphology: NORMAL

## 2024-08-10 LAB — PROTIME-INR
INR: 1.2 (ref 0.8–1.2)
Prothrombin Time: 16.1 s — ABNORMAL HIGH (ref 11.4–15.2)

## 2024-08-10 LAB — ABO/RH: ABO/RH(D): O POS

## 2024-08-10 LAB — POC OCCULT BLOOD, ED: Fecal Occult Bld: NEGATIVE

## 2024-08-10 LAB — SODIUM, URINE, RANDOM: Sodium, Ur: 45 mmol/L

## 2024-08-10 LAB — I-STAT CG4 LACTIC ACID, ED: Lactic Acid, Venous: 0.4 mmol/L — ABNORMAL LOW (ref 0.5–1.9)

## 2024-08-10 MED ORDER — PANTOPRAZOLE SODIUM 40 MG IV SOLR
40.0000 mg | Freq: Once | INTRAVENOUS | Status: AC
  Administered 2024-08-10: 40 mg via INTRAVENOUS
  Filled 2024-08-10: qty 10

## 2024-08-10 MED ORDER — FENTANYL CITRATE PF 50 MCG/ML IJ SOSY
50.0000 ug | PREFILLED_SYRINGE | Freq: Once | INTRAMUSCULAR | Status: AC
  Administered 2024-08-10: 50 ug via INTRAVENOUS
  Filled 2024-08-10: qty 1

## 2024-08-10 MED ORDER — ACETAMINOPHEN 500 MG PO TABS
1000.0000 mg | ORAL_TABLET | Freq: Four times a day (QID) | ORAL | Status: DC
  Administered 2024-08-10 – 2024-08-14 (×11): 1000 mg via ORAL
  Filled 2024-08-10 (×13): qty 2

## 2024-08-10 MED ORDER — SODIUM CHLORIDE 0.9 % IV BOLUS
500.0000 mL | Freq: Once | INTRAVENOUS | Status: AC
  Administered 2024-08-10: 500 mL via INTRAVENOUS

## 2024-08-10 MED ORDER — ALBUTEROL SULFATE (2.5 MG/3ML) 0.083% IN NEBU
INHALATION_SOLUTION | RESPIRATORY_TRACT | Status: AC
  Filled 2024-08-10: qty 12

## 2024-08-10 MED ORDER — OXYCODONE HCL 5 MG PO TABS: 5.0000 mg | ORAL_TABLET | Freq: Four times a day (QID) | ORAL | Status: DC | PRN

## 2024-08-10 MED ORDER — APIXABAN 2.5 MG PO TABS
2.5000 mg | ORAL_TABLET | Freq: Two times a day (BID) | ORAL | Status: DC
  Administered 2024-08-10 (×2): 2.5 mg via ORAL
  Filled 2024-08-10 (×2): qty 1

## 2024-08-10 MED ORDER — ROSUVASTATIN CALCIUM 5 MG PO TABS: 10.0000 mg | ORAL_TABLET | Freq: Every day | ORAL | Status: DC

## 2024-08-10 MED ORDER — SODIUM ZIRCONIUM CYCLOSILICATE 5 G PO PACK
5.0000 g | PACK | Freq: Once | ORAL | Status: AC
  Administered 2024-08-10: 5 g via ORAL
  Filled 2024-08-10: qty 1

## 2024-08-10 MED ORDER — TRAZODONE HCL 50 MG PO TABS
50.0000 mg | ORAL_TABLET | Freq: Every evening | ORAL | Status: DC | PRN
  Administered 2024-08-10 – 2024-08-13 (×3): 50 mg via ORAL
  Filled 2024-08-10 (×3): qty 1

## 2024-08-10 MED ORDER — ALBUTEROL SULFATE (2.5 MG/3ML) 0.083% IN NEBU
10.0000 mg | INHALATION_SOLUTION | Freq: Once | RESPIRATORY_TRACT | Status: AC
  Administered 2024-08-10: 10 mg via RESPIRATORY_TRACT
  Filled 2024-08-10: qty 12

## 2024-08-10 MED ORDER — POLYETHYLENE GLYCOL 3350 17 G PO PACK: 17.0000 g | PACK | Freq: Every day | ORAL | Status: DC | PRN

## 2024-08-10 MED ORDER — SODIUM BICARBONATE 8.4 % IV SOLN
INTRAVENOUS | Status: AC
  Filled 2024-08-10 (×2): qty 150

## 2024-08-10 MED ORDER — ROSUVASTATIN CALCIUM 5 MG PO TABS
10.0000 mg | ORAL_TABLET | Freq: Every day | ORAL | Status: DC
  Administered 2024-08-10: 10 mg via ORAL
  Filled 2024-08-10: qty 2

## 2024-08-10 MED ORDER — ENSURE PLUS HIGH PROTEIN PO LIQD
237.0000 mL | Freq: Two times a day (BID) | ORAL | Status: DC
Start: 2024-08-11 — End: 2024-08-17
  Administered 2024-08-12 – 2024-08-13 (×2): 237 mL via ORAL

## 2024-08-10 MED ORDER — SODIUM CHLORIDE 0.9 % IV SOLN: INTRAVENOUS | Status: DC

## 2024-08-10 NOTE — Progress Notes (Addendum)
 HD#0 SUBJECTIVE:  Patient Summary: Connor Wilkerson is a 83 year old gentleman with past medical history of CAD, CAD with ICD, HLP, CKD stage III, paroxysmal a-fib on DOAC, hypertension, came here with chief complaint of malaise and fatigue after 2 weeks of diarrhea, admitted here for elevated creatinine (12).  Overnight Events: Patient experienced agitation; managed with Trazodone .  Interim History: Patient is alert and oriented to location, recognizes Dr. Norrine, and is oriented to time. Appears drowsy with poor attention. Uncertain if he was ill prior to hospitalization. Reports no pain before admission and denies current pain, including back pain or headache. Cannot recall timing of last bowel movement. States he is urinating normally. Denies chills.  OBJECTIVE:  Vital Signs: Vitals:   08/10/24 0908 08/10/24 1030 08/10/24 1351 08/10/24 1501  BP:  (!) 137/54 (!) 128/98 122/87  Pulse:  86 81 81  Resp:  16 17 17   Temp: 98.5 F (36.9 C)  98.1 F (36.7 C) 97.7 F (36.5 C)  TempSrc: Oral  Oral   SpO2:  100% 100% 100%  Weight:       Supplemental O2: Room Air SpO2: 100 %  Filed Weights   08/10/24 0507  Weight: 69.9 kg     Intake/Output Summary (Last 24 hours) at 08/10/2024 1504 Last data filed at 08/10/2024 0954 Gross per 24 hour  Intake 603.33 ml  Output 350 ml  Net 253.33 ml   Net IO Since Admission: 253.33 mL [08/10/24 1504]  Physical Exam: General: Awake but drowsy, inattentive, confused. Cachectic, chronically ill-appearing, non-toxic. Poor skin turgor noted. HEENT: Eyes: Extraocular movements intact. ENT: Poor dentition, mucous membranes moist. Neck: Supple, no JVD. Cardiovascular: Regular rate and rhythm. No rubs appreciated. Respiratory: Observed only anterior lung fields. Clear to auscultation where assessed. Abdomen: Soft, mildly tender to palpation, no rebound or guarding. Normoactive bowel sounds. Back: No abnormalities noted. GU: No Foley  catheter. Extremities: No edema. Missing second toe (side unclear) with toe deformities. Neurological: Drowsy, alert and oriented to self and place, partially oriented to time. Difficulty with attention and details. Skin: Forehead bandage from recent skin cancer removal, left forearm bandage from skin tear. No rashes noted.  Patient Lines/Drains/Airways Status     Active Line/Drains/Airways     Name Placement date Placement time Site Days   Peripheral IV 08/10/24 20 G 1 Anterior;Right Forearm 08/10/24  0529  Forearm  less than 1            Pertinent labs and imaging:      Latest Ref Rng & Units 08/10/2024    5:45 AM 08/10/2024    5:37 AM 10/07/2023    2:24 PM  CBC  WBC 4.0 - 10.5 K/uL  7.6  7.4   Hemoglobin 13.0 - 17.0 g/dL 8.5  8.0  88.6   Hematocrit 39.0 - 52.0 % 25.0  24.4  36.5   Platelets 150 - 400 K/uL  134  161        Latest Ref Rng & Units 08/10/2024    5:45 AM 08/10/2024    5:37 AM 10/07/2023    2:24 PM  CMP  Glucose 70 - 99 mg/dL 891  887  97   BUN 8 - 23 mg/dL >869  846  37   Creatinine 0.61 - 1.24 mg/dL 87.59  88.40  7.84   Sodium 135 - 145 mmol/L 141  136  140   Potassium 3.5 - 5.1 mmol/L 5.3  5.1  5.1   Chloride 98 - 111 mmol/L 112  110  109   CO2 22 - 32 mmol/L  11  23   Calcium  8.9 - 10.3 mg/dL  8.6  9.5   Total Protein 6.5 - 8.1 g/dL  6.2    Total Bilirubin 0.0 - 1.2 mg/dL  0.6    Alkaline Phos 38 - 126 U/L  50    AST 15 - 41 U/L  33    ALT 0 - 44 U/L  32      CT ABDOMEN PELVIS WO CONTRAST Result Date: 08/10/2024 EXAM: CT ABDOMEN AND PELVIS WITHOUT CONTRAST 08/10/2024 10:44:36 AM TECHNIQUE: CT of the abdomen and pelvis was performed without the administration of intravenous contrast. Multiplanar reformatted images are provided for review. Automated exposure control, iterative reconstruction, and/or weight-based adjustment of the mA/kV was utilized to reduce the radiation dose to as low as reasonably achievable. COMPARISON: CT dated 03/23/23. CLINICAL  HISTORY: Abdominal pain. FINDINGS: LOWER CHEST: Mild dependent atelectasis in the lower lobes. Aicd leads noted. LIVER: The liver is unremarkable. GALLBLADDER AND BILE DUCTS: Gallbladder is unremarkable. No biliary ductal dilatation. SPLEEN: No acute abnormality. PANCREAS: No acute abnormality. ADRENAL GLANDS: No acute abnormality. KIDNEYS, URETERS AND BLADDER: The small exophytic region of concern from the left kidney upper pole shown on prior exams is obscured by motion artifact on today's exams but appears roughly similar to previous. This lesion remains indeterminate. No stones in the kidneys or ureters. No hydronephrosis. No perinephric or periureteral stranding. Urinary bladder is unremarkable. GI AND BOWEL: Scattered sigmoid colon diverticula. Stomach demonstrates no acute abnormality. There is no bowel obstruction. PERITONEUM AND RETROPERITONEUM: No ascites. No free air. VASCULATURE: Systemic atherosclerosis is present, including the aorta and iliac arteries. Coronary and aortic atherosclerosis. LYMPH NODES: No lymphadenopathy. REPRODUCTIVE ORGANS: No acute abnormality. BONES AND SOFT TISSUES: Dextroconvex lumbar scoliosis with rotary component. Thoracic and lumbar spondylosis and degenerative disc disease with resulting moderate right foraminal impingement at L2-L3, L3-L4, and L4-L5, and moderate left foraminal impingement at L2-L3. Mild grade 1 degenerative retrolisthesis at all levels between L1 and S1. Acetabular labral chondrocalcinosis bilaterally. Small left groin hernia contains adipose tissue. No acute osseous abnormality. No focal soft tissue abnormality. IMPRESSION: 1. Small exophytic lesion at the left renal upper pole remains indeterminate due to motion artifact. 2. No acute findings in the abdomen or pelvis. 3. Systemic atherosclerosis, including aorta and iliac arteries. 4. Coronary atherosclerosis. 5. Thoracic and lumbar spondylosis and degenerative disc disease with moderate right foraminal  impingement at L3-L4 and L4-L5, and moderate left foraminal impingement at L2-L3. 6. Mild grade 1 degenerative retrolisthesis from L1 to S1. 7. Acetabular labral chondrocalcinosis bilaterally. 8. Dextroconvex lumbar scoliosis with rotary component. Electronically signed by: Ryan Salvage MD 08/10/2024 11:15 AM EDT RP Workstation: [id number]   DG Lumbar Spine Complete Result Date: 08/10/2024 EXAM: 5 VIEW(S) XRAY OF THE LUMBAR SPINE 08/10/2024 06:00:58 AM COMPARISON: CT lumbar myelogram dated 07/21/2024 and earlier studies. CLINICAL HISTORY: 83 year old male with lower back pain and weakness. FINDINGS: LUMBAR SPINE: BONES: Stable vertebral height and alignment including s shaped lumbar scoliosis and straightening of lordosis. No acute fracture. No aggressive appearing osseous lesion. DISCS AND DEGENERATIVE CHANGES: Chronic severe lumbar disc and endplate degeneration with diffuse vacuum disc and endplate spurring. SOFT TISSUES: Calcified abdominal aortic atherosclerosis. No acute abnormality. IMPRESSION: 1. No acute abnormality of the lumbar spine. 2. Chronic severe lumbar disc and endplate degeneration with underlying S-shaped lumbar scoliosis stable from CT myelogram last month. Electronically signed by: Helayne Hurst MD 08/10/2024 06:08 AM EDT  RP Workstation: [id number]   DG Chest Port 1 View Result Date: 08/10/2024 EXAM: 1 VIEW(S) XRAY OF THE CHEST 08/10/2024 05:59:33 AM COMPARISON: CT chest, abdomen, and pelvis dated 09/28/2022 and earlier. CLINICAL HISTORY: 83 year old male with dyspnea on exertion. FINDINGS: LUNGS AND PLEURA: No focal pulmonary opacity. No pulmonary edema. No pleural effusion. No pneumothorax. HEART AND MEDIASTINUM: Stable left chest Automatic Implantable Cardioverter-Defibrillator (AICD). Chronic Coronary Artery Bypass Grafting (CABG) and sternotomy. Mediastinal contours are stable and within normal limits. BONES AND SOFT TISSUES: Right total shoulder arthroplasty is new from 2023.  IMPRESSION: 1. No acute cardiopulmonary process. Electronically signed by: Helayne Hurst MD 08/10/2024 06:06 AM EDT RP Workstation: [id number]    ASSESSMENT/PLAN:  Assessment: Principal Problem:   Acute renal failure Active Problems:   Ischemic cardiomyopathy   Atrial fibrillation (HCC)   Hypercoagulable state   Malaise   Back pain   Macrocytic anemia   Thrombocytopenia   PAD (peripheral artery disease)   Encephalopathy   Plan: [name] is a 83 year old gentleman with past medical history of CAD, CAD with ICD, HLP, CKD stage III, paroxysmal a-fib on DOAC, hypertension, came here with chief complaint of malaise and fatigue after 2 weeks of diarrhea, admitted here for elevated creatinine (12).  #AKI on CKD #Confusion #Malaise Connor Wilkerson. Connor Wilkerson is an 83 year old male with HFrEF (EF 35% on last TTE 2019), CAD, ICD, HTN, hyperlipidemia, and atrial fibrillation, presenting with weakness and diarrhea x 2 weeks, found to have severe AKI on CKD stage IV. Oliguria suspected (output ~400 mL/24h).  No obstructive uropathy (CT A/P negative). Exam: dehydrated, poor skin turgor. History of poor oral intake. UA largely bland; 1+ blood, 1+ protein, no RBCs on microscopy. FeNa 5%, urine/serum studies pending. No NSAID use outside hospital. Mild thrombocytopenia noted; smear shows normal platelet morphology. Nephrology consulted. Plan: - Likely pre-renal AKI vs progressive CKD with uremic symptoms. - Dialysis indicated: IR temporary HD catheter placement and HD #1 today. - Continue bicarbonate infusion for NAGMA until HD. - Strict I/Os, daily weights, monitor PVR each shift. - Avoid extremes of BP and nephrotoxins. - Hold RAAS blockers/Entresto  due to AKI and HFrEF. - Monitor labs: electrolytes, kidney function, FeNa, urine studies, myeloma work-up pending. - CK :1200; statin held.  #Anemia HB: 7(down from 8), MCV:95.  Iron:137, B12:249, folate: normal. - SPEP, UPEP, SFLC pending. - Hb  trending down; check hemoccult. Transfuse if Hb <7 g/dL.  #Back Pain Patient has thoracic and lumbar spondylosis with degenerative disc disease and moderate central spinal stenosis at L3-L5. Imaging shows:Right foraminal impingement at L3-L4 and L4-L5, Moderate left foraminal impingement at L2-L3 - Continue pain management tailored to his comorbidities (HFrEF, CKD, and AKI) - Avoid nephrotoxic or cardiodepressant medications - Monitor for changes in mobility, neurologic symptoms, or worsening pain - Consider physical therapy or consult spine/pain specialist if pain persists or worsens  #Falls/Debility Consider uremia as contributing factor. Primary team to continue work-up.  #HFrEF Appears euvolemic after overnight hydration. - Entresto  held in setting of severe AKI.  #HTN BP slightly elevated; monitor closely. - Avoid hypotension; continue holding Entresto .  #Atrial Fibrillation (HCC) Chronic, paroxysmal. Low AF burden by ICD interrogation. Rhythm currently sinus. - Resume outpatient Eliquis  2.5 mg BID.  #NAGMA Improved, bicarb: 18.  Present in context of severe AKI. - Improving with bicarbonate infusion, continue until dialysis.  #Hyperkalemia - Resolved following Lokelma administration.  #Hyperlipidemia - CK mildly elevated; statin held due to AKI.  #Thrombocytopenia Mild, stable in 120s. Smear shows normal  platelet morphology. Continue monitoring.   Best Practice: Diet: Regular diet IVF: Fluids: sodium bicarbonate infusion  VTE: apixaban  (ELIQUIS ) tablet 2.5 mg Start: 08/10/24 1000 Code: Full, but wouldn't want life-sustaining measures if he couldn't be liberated from ventilator   Disposition planning: Therapy Recs: None,  Family Contact: Wife DISPO: Anticipated discharge in 2 days to Home pending medical improvement.  Signature:  Armando Bernadine [facility]  3:04 PM, 08/10/2024  On Call pager [phone]

## 2024-08-10 NOTE — ED Notes (Signed)
 Pt. Back from CT.

## 2024-08-10 NOTE — Progress Notes (Signed)
 Internal medicine DO Lyles was notified via chat that patient is very restless informed that patient takes trazodone  50 mg did vitals all WNL New order received.

## 2024-08-10 NOTE — ED Provider Notes (Signed)
 [place] EMERGENCY DEPARTMENT AT [facility] Provider Note  CSN: [id number] Arrival date & time: 08/10/24 0458  Chief Complaint(s) Weakness  HPI Connor Wilkerson is a 83 y.o. male     Weakness Severity:  Severe Onset quality:  Gradual Duration:  2 weeks Timing:  Constant Progression:  Worsening Chronicity:  New Context: not change in medication, not dehydration, not drug use, not increased activity, not recent infection, not stress and not urinary tract infection   Relieved by:  Nothing Worsened by:  Nothing Associated symptoms: diarrhea, difficulty walking, falls (a few weeks ago) and melena (possible)   Associated symptoms: no chest pain, no dizziness, no dysuria, no numbness in extremities, no fever, no hematochezia and no shortness of breath   Risk factors: congestive heart failure     Past Medical History Past Medical History:  Diagnosis Date   Abnormality of gait 02/07/2015   Arthritis    shoulders, pinched nerve around waist and back   Asthma    CAD (coronary artery disease)    Hx of Inf MI treated with CABG in 1999 // Myoview 11/16: mild inf-lat ischemia, EF 34, high risk // LHC 3/19: 3/3 bypass grafts patent   Carotid artery disease    Followed by [facility] (Dr. Serene) // US  in 2/19: bilat ICA 40-59   Chronic systolic CHF (congestive heart failure) (HCC) 01/01/2010   Echo 12/30/2017: Mild LVH, EF 30-35, diffuse HK, grade 1 diastolic dysfunction, mildly reduced RVSF // Echo 04/12/2016:  EF 35-40, PASP 39, mild to moderate TR, mild LAE // Echo (12/14):  Base/mid inferior and inferolateral HK, EF 45%, mild LAE, mildly reduced RVSF   CKD (chronic kidney disease) Stage 3 B    followed by dr peoples q year lov 04-23-2022 on chart   Depression    HLP (hyperkeratosis lenticularis perstans)    HTN (hypertension)    ICD (implantable cardiac defibrillator)  BSx    [employer]   Ischemic cardiomyopathy    Myocardial infarction (HCC) 1999   Near syncope  09/21/2014   Orthostatic hypotension    PAF (paroxysmal atrial fibrillation) (HCC)    Peripheral vascular disease    Wears glasses    Wears partial dentures    upper   Patient Active Problem List   Diagnosis Date Noted   Cervical radiculopathy 06/02/2024   Lesion of ulnar nerve 06/02/2024   Osteoarthritis 06/02/2024   Shoulder pain 06/02/2024   Skin sensation disturbance 06/02/2024   S/P shoulder replacement, right 10/16/2023   Carotid artery disease 08/06/2022   PVC (premature ventricular contraction) 04/04/2022   Orthostatic hypotension 08/27/2020   Paroxysmal atrial fibrillation (HCC) 02/29/2020   Secondary hypercoagulable state 02/29/2020   CAD (coronary artery disease) 12/07/2018   Hyperlipidemia 12/07/2018   CKD (chronic kidney disease) stage 3, GFR 30-59 ml/min (HCC) 12/07/2018   Ischemic cardiomyopathy    Essential hypertension 09/22/2016   Restless leg syndrome 07/14/2016   AKI (acute kidney injury) 04/11/2016   Hypotension 04/11/2016   Abnormality of gait 02/07/2015   Near syncope 09/21/2014   Repeated falls 12/15/2013   OSA (obstructive sleep apnea) 12/15/2013   Chronic systolic CHF (congestive heart failure) (HCC) 01/01/2010   CARDIOMYOPATHY, ISCHEMIC 12/31/2009   DYSPNEA 12/31/2009   Implantable cardioverter-defibrillator (ICD) in situ 12/31/2009   Home Medication(s) Prior to Admission medications   Medication Sig Start Date End Date Taking? Authorizing Provider  amLODipine  (NORVASC ) 2.5 MG tablet TAKE 2 TABLETS BY MOUTH DAILY 03/23/24   Fernande Elspeth BROCKS, MD  apixaban  (ELIQUIS ) 2.5 MG TABS tablet Take 1 tablet (2.5 mg total) by mouth 2 (two) times daily. 06/16/24   Court Dorn PARAS, MD  buPROPion  (WELLBUTRIN  SR) 150 MG 12 hr tablet Take 300 mg by mouth daily. 05/21/23   [provider]  escitalopram  (LEXAPRO ) 10 MG tablet Take 10 mg by mouth daily.    [provider]  famotidine  (PEPCID ) 20 MG tablet Take 20 mg by mouth 2 (two) times daily.     [provider]  gabapentin  (NEURONTIN ) 300 MG capsule Take 1,200 mg by mouth at bedtime.    [provider]  hydrALAZINE  (APRESOLINE ) 25 MG tablet Take 1 tablet twice daily as needed for systolic blood pressure (top number) greater than 160. 05/05/23   Monge, Damien BROCKS, NP  HYDROcodone -acetaminophen  (NORCO) 5-325 MG tablet Take 1 tablet by mouth every 6 (six) hours as needed for moderate pain (pain score 4-6). 10/16/23   Kay Kemps, MD  lidocaine  (LIDODERM ) 5 % Place 1 patch onto the skin daily. 07/13/24   [provider]  mupirocin  ointment (BACTROBAN ) 2 % Apply 1 Application topically 2 (two) times daily. 08/01/24   [provider]  nitroGLYCERIN  (NITROSTAT ) 0.4 MG SL tablet Place 1 tablet (0.4 mg total) under the tongue every 5 (five) minutes as needed for chest pain. 12/11/22   Fernande Elspeth BROCKS, MD  predniSONE (STERAPRED UNI-PAK 21 TAB) 5 MG (21) TBPK tablet Take 5 mg by mouth as directed. 10/18/23   [provider]  rosuvastatin  (CRESTOR ) 40 MG tablet TAKE 1 TABLET BY MOUTH EVERY DAY 11/24/23   Fernande Elspeth BROCKS, MD  sacubitril -valsartan  (ENTRESTO ) 24-26 MG TAKE 1/2 TABLET TWICE A DAY BY MOUTH 05/03/24   Court Dorn PARAS, MD  traZODone  (DESYREL ) 50 MG tablet Take 50 mg by mouth at bedtime. 12/21/19   [provider]                                                                                                                                    Allergies Tramadol  Review of Systems Review of Systems  Constitutional:  Negative for fever.  Respiratory:  Negative for shortness of breath.   Cardiovascular:  Negative for chest pain.  Gastrointestinal:  Positive for diarrhea and melena (possible). Negative for hematochezia.  Genitourinary:  Negative for dysuria.  Musculoskeletal:  Positive for falls (a few weeks ago).  Neurological:  Positive for weakness. Negative for dizziness.   As noted in HPI  Physical Exam Vital Signs  I have reviewed the  triage vital signs BP (!) 133/94   Pulse 68   Temp 98.6 F (37 C) (Oral)   Resp (!) 23   Wt 69.9 kg   SpO2 100%   BMI 22.11 kg/m   Physical Exam Vitals reviewed.  Constitutional:      General: He is not in acute distress.    Appearance: He is well-developed. He is not diaphoretic.  HENT:     Head: Normocephalic and atraumatic.     Nose: Nose normal.  Eyes:     General: No scleral icterus.       Right eye: No discharge.        Left eye: No discharge.     Conjunctiva/sclera: Conjunctivae normal.     Pupils: Pupils are equal, round, and reactive to light.  Cardiovascular:     Rate and Rhythm: Normal rate and regular rhythm.     Heart sounds: No murmur heard.    No friction rub. No gallop.  Pulmonary:     Effort: Pulmonary effort is normal. No respiratory distress.     Breath sounds: Normal breath sounds. No stridor. No rales.  Abdominal:     General: There is no distension.     Palpations: Abdomen is soft.     Tenderness: There is no abdominal tenderness.  Musculoskeletal:        General: No tenderness.     Cervical back: Normal range of motion and neck supple.  Skin:    General: Skin is warm and dry.     Findings: No erythema or rash.  Neurological:     Mental Status: He is alert and oriented to person, place, and time.     Comments: Moving all extremities with good strength     ED Results and Treatments Labs (all labs ordered are listed, but only abnormal results are displayed) Labs Reviewed  COMPREHENSIVE METABOLIC PANEL WITH GFR - Abnormal; Notable for the following components:      Result Value   CO2 11 (*)    Glucose, Bld 112 (*)    BUN 153 (*)    Creatinine, Ser 11.59 (*)    Calcium  8.6 (*)    Total Protein 6.2 (*)    GFR, Estimated 4 (*)    All other components within normal limits  CBC WITH DIFFERENTIAL/PLATELET - Abnormal; Notable for the following components:   RBC 2.43 (*)    Hemoglobin 8.0 (*)    HCT 24.4 (*)    MCV 100.4 (*)    Platelets  134 (*)    All other components within normal limits  PROTIME-INR - Abnormal; Notable for the following components:   Prothrombin Time 16.1 (*)    All other components within normal limits  I-STAT CHEM 8, ED - Abnormal; Notable for the following components:   Potassium 5.3 (*)    Chloride 112 (*)    BUN >130 (*)    Creatinine, Ser 12.40 (*)    Glucose, Bld 108 (*)    TCO2 12 (*)    Hemoglobin 8.5 (*)    HCT 25.0 (*)    All other components within normal limits  I-STAT CG4 LACTIC ACID, ED - Abnormal; Notable for the following components:   Lactic Acid, Venous 0.4 (*)    All other components within normal limits  TROPONIN I (HIGH SENSITIVITY) - Abnormal; Notable for the following components:   Troponin I (High Sensitivity) 95 (*)    All other components within normal limits  BRAIN NATRIURETIC PEPTIDE  URINALYSIS, ROUTINE W REFLEX MICROSCOPIC  SODIUM, URINE, RANDOM  CREATININE, URINE, RANDOM  POC OCCULT BLOOD, ED  TYPE AND SCREEN  ABO/RH  TROPONIN I (HIGH SENSITIVITY)  EKG  EKG Interpretation Date/Time:  Wednesday August 10 2024 07:34:01 EDT Ventricular Rate:  70 PR Interval:  192 QRS Duration:  94 QT Interval:  366 QTC Calculation: 395 R Axis:   71  Text Interpretation: Sinus rhythm Borderline repolarization abnormality Confirmed by Trine Likes [id number]) on 08/10/2024 7:41:48 AM       Radiology DG Lumbar Spine Complete Result Date: 08/10/2024 EXAM: 5 VIEW(S) XRAY OF THE LUMBAR SPINE 08/10/2024 06:00:58 AM COMPARISON: CT lumbar myelogram dated 07/21/2024 and earlier studies. CLINICAL HISTORY: 83 year old male with lower back pain and weakness. FINDINGS: LUMBAR SPINE: BONES: Stable vertebral height and alignment including s shaped lumbar scoliosis and straightening of lordosis. No acute fracture. No aggressive appearing osseous lesion. DISCS AND  DEGENERATIVE CHANGES: Chronic severe lumbar disc and endplate degeneration with diffuse vacuum disc and endplate spurring. SOFT TISSUES: Calcified abdominal aortic atherosclerosis. No acute abnormality. IMPRESSION: 1. No acute abnormality of the lumbar spine. 2. Chronic severe lumbar disc and endplate degeneration with underlying S-shaped lumbar scoliosis stable from CT myelogram last month. Electronically signed by: Helayne Hurst MD 08/10/2024 06:08 AM EDT RP Workstation: [id number]   DG Chest Port 1 View Result Date: 08/10/2024 EXAM: 1 VIEW(S) XRAY OF THE CHEST 08/10/2024 05:59:33 AM COMPARISON: CT chest, abdomen, and pelvis dated 09/28/2022 and earlier. CLINICAL HISTORY: 83 year old male with dyspnea on exertion. FINDINGS: LUNGS AND PLEURA: No focal pulmonary opacity. No pulmonary edema. No pleural effusion. No pneumothorax. HEART AND MEDIASTINUM: Stable left chest Automatic Implantable Cardioverter-Defibrillator (AICD). Chronic Coronary Artery Bypass Grafting (CABG) and sternotomy. Mediastinal contours are stable and within normal limits. BONES AND SOFT TISSUES: Right total shoulder arthroplasty is new from 2023. IMPRESSION: 1. No acute cardiopulmonary process. Electronically signed by: Helayne Hurst MD 08/10/2024 06:06 AM EDT RP Workstation: [id number]    Medications Ordered in ED Medications  albuterol (PROVENTIL) (2.5 MG/3ML) 0.083% nebulizer solution (has no administration in time range)  0.9 %  sodium chloride  infusion (has no administration in time range)  pantoprazole  (PROTONIX ) injection 40 mg (40 mg Intravenous Given 08/10/24 0537)  fentaNYL  (SUBLIMAZE ) injection 50 mcg (50 mcg Intravenous Given 08/10/24 0647)  albuterol (PROVENTIL) (2.5 MG/3ML) 0.083% nebulizer solution 10 mg (10 mg Nebulization Given 08/10/24 0736)  sodium chloride  0.9 % bolus 500 mL (500 mLs Intravenous New Bag/Given 08/10/24 0736)   Procedures Ultrasound ED Abd  Date/Time: 08/10/2024 7:42 AM  Performed by: Trine Likes Moder, MD Authorized by: Trine Likes Moder, MD   Procedure details:    Indications: back pain     Assessment for:  Hydronephrosis   Left renal:  Unable to visualize   Right renal:  Visualized   Bladder:  Visualized        Right renal findings:    Hydronephrosis: none   Bladder findings:    Free pelvic fluid: identified     Post-void residual volume:  <60cc .Critical Care  Performed by: Trine Likes Moder, MD Authorized by: Trine Likes Moder, MD   Critical care provider statement:    Critical care time (minutes):  53   Critical care time was exclusive of:  Separately billable procedures and treating other patients   Critical care was necessary to treat or prevent imminent or life-threatening deterioration of the following conditions:  Renal failure   Critical care was time spent personally by me on the following activities:  Development of treatment plan with patient or surrogate, discussions with consultants, evaluation of patient's response to treatment, examination of patient, obtaining history from patient  or surrogate, review of old charts, re-evaluation of patient's condition, pulse oximetry, ordering and review of radiographic studies, ordering and review of laboratory studies and ordering and performing treatments and interventions   Care discussed with: admitting provider     (including critical care time) Medical Decision Making / ED Course   Medical Decision Making Amount and/or Complexity of Data Reviewed Labs: ordered. Decision-making details documented in ED Course. Radiology: ordered and independent interpretation performed. Decision-making details documented in ED Course. ECG/medicine tests: ordered and independent interpretation performed. Decision-making details documented in ED Course.  Risk Prescription drug management. Parenteral controlled substances. Decision regarding hospitalization.    Patient presented with generalized fatigue.   Differential diagnosis considered.  Workup below. CBC without leukocytosis.  Hemoglobin of 8.0 down from 11.3, 10 months ago.  Hemoccult negative. CMP without significant electrolyte derangements.  Acute renal failure noted with a creatinine of 11.5 down from 2.1, 10 months ago. BnP not consistent with heart failure.  Patient appears to be dry and not volume overloaded.  Chest x-ray without evidence of pulmonary edema.  No pneumonia.  Patient complained of back pain and had recent falls but x-ray negative for any acute fractures.  No urinary retention on bedside ultrasound.  Consulted nephrology and spoke w/ Dr. Norine. Recommended IVF hydration. They will see in consultation.  Medicine admit    Final Clinical Impression(s) / ED Diagnoses Final diagnoses:  Acute renal failure, unspecified acute renal failure type    This chart was dictated using voice recognition software.  Despite best efforts to proofread,  errors can occur which can change the documentation meaning.    Trine Raynell Moder, MD 08/10/24 [phone]

## 2024-08-10 NOTE — Hospital Course (Addendum)
 Connor Wilkerson. Connor Wilkerson is an 83 year old gentleman with a past medical history of ischemic cardiomyopathy (EF 35% in 2019, improved to 55-60% by 2023), CAD with ICD, CKD stage III, hypertension, hyperlipidemia, and paroxysmal atrial fibrillation on Eliquis , who presented with progressive weakness, poor appetite, and diarrhea for several days. His wife also noted that he had become increasingly confused and less interactive over the past week.  # Acute Kidney Injury on CKD On admission, his creatinine was 11.8 mg/dL (baseline ~7.8-7.4) and BUN 142, consistent with severe AKI on CKD. Urinalysis revealed 1+ protein and 1+ blood without RBCs on microscopy; FeNa 5%, U/P ratio 0.9. Renal ultrasound and CT abdomen/pelvis ruled out obstruction. Myeloma work-up (SPEP, UPEP, and serum free light chains) was negative for multiple myeloma.  Given the rapid rise in creatinine and uremic symptoms (confusion, weakness, poor appetite), nephrology was consulted.  A temporary hemodialysis catheter was placed, and the patient underwent three HD sessions (10/11, 10/12, and 10/14).  He showed remarkable improvement in mentation and appetite after initiation of dialysis, confirming uremic encephalopathy as the cause of his altered mental status.  His creatinine stabilized around 4.5-4.6 mg/dL after dialysis.  The etiology was most consistent with AKI superimposed on progressive CKD, likely due to volume depletion from diarrhea and poor intake, possibly worsened by nephrotoxic medications.  Held medications: Entresto    Monitoring: Strict I/Os, daily weights, PVR checks, and avoidance of hypotension or nephrotoxins.  He remains oliguric and will continue on a hemodialysis schedule (TTS) as arranged with the [facility]). Social work confirmed SNF placement at [place], which can coordinate transportation to and from dialysis.  # Altered Mental Status At presentation, Connor Wilkerson was somnolent and  disoriented but exhibited no focal neurological deficits. His altered mental status was attributed to uremic encephalopathy secondary to severe azotemia (BUN 142, creatinine 10.8). The accumulation of uremic toxins, electrolyte imbalances, and metabolic acidosis likely contributed to his confusion and lethargy. There was no evidence of infection, hypoxia, hypoglycemia, or structural brain lesion to explain his symptoms. Following two sessions of hemodialysis, which helped remove nitrogenous waste and correct metabolic derangements, his mental clarity improved markedly--he became alert, interactive, oriented to person and place, and conversational.   # Anemia of CKD Hemoglobin declined from 8.0 ? 6.7 g/dL during hospitalization. Iron, B12, and folate levels were normal, and occult blood test was negative. He received 1 unit of PRBC (10/14) with good response, and ESA therapy was initiated per nephrology for long-term management at the HD center.  # Chronic Systolic Heart Failure (Recovered EF) Most recent EF: 55-60% (2023). Clinically euvolemic with no edema or dyspnea. Entresto  and diuretics were held to avoid worsening renal injury. No ultrafiltration was performed during initial dialysis sessions due to stable volume status. ICD interrogation showed normal function.  # Back Pain and Spinal Stenosis  MRI lumbar spine: moderate central stenosis (L3-L5) with right > left foraminal narrowing. Pain managed with diclofenac gel, lidocaine  patch, and renally dosed gabapentin ; tramadol PRN for severe pain. Avoided NSAIDs and sedatives. Pain and mobility improved with PT/OT.  # Hypertension Blood pressure remained stable (avg 130/60 mmHg). Entresto  and amlodipine  were held during the AKI phase. Plan to resume gentle BP control (amlodipine ).  # Paroxysmal Atrial Fibrillation Remained in sinus rhythm throughout hospitalization. Eliquis  2.5 mg BID resumed once renal function and hemoglobin  stabilized.  # Rhabdomyolysis Concern CK mildly elevated (1210 ? peak 1719), likely due to dehydration and immobility. Crestor  discontinued; CK down-trended to 300. No myalgia  or myoglobinuria discharge.  # Thrombocytopenia Mild and stable (platelets 110-125K), with normal morphology on smear. No bleeding episodes. Continue to monitor.   # Fall Risk, Debility, and Poor Oral Intake Likely related to uremia and deconditioning. Nutrition started renal diet with Ensure Plus High Protein BID. PT/OT worked on Geneticist, molecular; patient ambulated short distances with assistance. Planned transfer to SNF for continued rehab and HD coordination.  # Depression and Sleep Known depression on Wellbutrin  XL 150 mg daily. Continued trazodone  50 mg PRN and gabapentin  300 mg BID (renally dosed). Mood and sleep improved as uremia resolved.  # Current Status and Plan - As of 10/15, [name] is alert, interactive, and hemodynamically stable.  - Cr ~4.5-4.6, BUN 52, Hb improved post-transfusion, CK trending down, BP stable, euvolemic.  - Nephrology cleared for discharge to [facility] with scheduled hemodialysis at [facility] (TTS).  - Primary Care follow-up: within 1-2 weeks.  - Nephrology follow-up: at dialysis center for ongoing management and ESA dosing.

## 2024-08-10 NOTE — Progress Notes (Signed)
 PT Cancellation Note  Patient Details Name: RANDAL GOENS MRN: [id number] DOB: August 18, 1941   Cancelled Treatment:    Reason Eval/Treat Not Completed: (P) Patient at procedure or test/unavailable. Will plan to follow-up as able.   Theo Ferretti, PT, DPT Acute Rehabilitation Services  Office: [phone]    Theo CHRISTELLA Ferretti 08/10/2024, 2:02 PM

## 2024-08-10 NOTE — Consult Note (Signed)
 [facility]  INPATIENT CONSULTATION  Reason for Consultation: AKI  Requesting Provider: Dr. Trine  HPI: Connor Wilkerson is an 83 y.o. male with HFrEF (last TTE 2019 EF 35%), CAD, ICD, HTN, HL, A fib presenting with weakness and diarrhea x 2 weeks who is found to have severe AKI on CKD for which nephrology is consulted.   Presented to the ED via EMS today for evaluation.  Reporting to me that he's been weak, poor appetite, less ambulatory 2-3wks.  He says had 3 loose stools yesterday.  No dyspnea, pruritus.  +Dysgeusia, poor po intake and wt loss ~15lbs.  3 weeks ago had 7 falls in 1 day and since that time hasn't fallen as much as he's been less ambulatory  and no focal neurologic issues.  Forgetfulness recently. + chills no fevers.  Dec UOP, no difficulty voiding.  Labs showing K 5.1, BUN 153, Cr 11.6, normal LFTs, Hb 8, Plt 134, WBC 7.6. CXR clear, Lumbar spine films with chronic changes. ED MD says bladder scan .   He's a bit of a poor historian today.  Wife is bedside and gives some history.  A woman who identifies herself as a housekeeper is also bedside and provides some history.   Outpt meds include entresto , hydralazine , amlodipine  which he's been taking off/on.  Doesn't appear to be on diuretics or SGLT2i.   F/b Dr. Macel SERUM but last OV 06/2023. 10/2023 Cr 2.2 last on file.   PMH: Past Medical History:  Diagnosis Date   Abnormality of gait 02/07/2015   Arthritis    shoulders, pinched nerve around waist and back   Asthma    CAD (coronary artery disease)    Hx of Inf MI treated with CABG in 1999 // Myoview 11/16: mild inf-lat ischemia, EF 34, high risk // LHC 3/19: 3/3 bypass grafts patent   Carotid artery disease    Followed by [facility] (Dr. Serene) // US  in 2/19: bilat ICA 40-59   Chronic systolic CHF (congestive heart failure) (HCC) 01/01/2010   Echo 12/30/2017: Mild LVH, EF 30-35, diffuse HK, grade 1 diastolic dysfunction, mildly reduced RVSF // Echo  04/12/2016:  EF 35-40, PASP 39, mild to moderate TR, mild LAE // Echo (12/14):  Base/mid inferior and inferolateral HK, EF 45%, mild LAE, mildly reduced RVSF   CKD (chronic kidney disease) Stage 3 B    followed by dr peoples q year lov 04-23-2022 on chart   Depression    HLP (hyperkeratosis lenticularis perstans)    HTN (hypertension)    ICD (implantable cardiac defibrillator)  BSx    [employer]   Ischemic cardiomyopathy    Myocardial infarction (HCC) 1999   Near syncope 09/21/2014   Orthostatic hypotension    PAF (paroxysmal atrial fibrillation) (HCC)    Peripheral vascular disease    Wears glasses    Wears partial dentures    upper   PSH: Past Surgical History:  Procedure Laterality Date   AMPUTATION TOE Left 06/17/2023   Procedure: LEFT 2ND TOE AMPUTATION THRU PROXIMAL PHALANX;  Surgeon: Barton Drape, MD;  Location: [facility];  Service: Orthopedics;  Laterality: Left;   CARDIAC CATHETERIZATION N/A 12/11/2015   Procedure: Left Heart Cath and Coronary Angiography;  Surgeon: Rober Chroman, MD;  Location: [facility] INVASIVE CV LAB;  Service: Cardiovascular;  Laterality: N/A;   CARDIAC DEFIBRILLATOR PLACEMENT     [employer]   CARDIAC DEFIBRILLATOR PLACEMENT  04/08/2016   NOT MRI SAFE (DOCUMENT SCANNED IN SYSTEM  COLONOSCOPY  10/06/2007   Mild sigmoid diverticulosis. Small internal hemorrhoids.    CORONARY ARTERY BYPASS GRAFT     ELBOW SURGERY Left    plastic bone replacement   HERNIA REPAIR     x2   LEFT HEART CATH AND CORS/GRAFTS ANGIOGRAPHY N/A 01/07/2018   Procedure: LEFT HEART CATH AND CORS/GRAFTS ANGIOGRAPHY;  Surgeon: Verlin Lonni BIRCH, MD;  Location: [facility] INVASIVE CV LAB;  Service: Cardiovascular;  Laterality: N/A;   PERMANENT PACEMAKER GENERATOR CHANGE N/A 11/25/2013   Procedure: PERMANENT PACEMAKER GENERATOR CHANGE;  Surgeon: Elspeth JAYSON Sage, MD;  Location: [facility] CATH LAB;  Service: Cardiovascular;  Laterality: N/A;    REVERSE SHOULDER ARTHROPLASTY  Right 10/16/2023   Procedure: REVERSE SHOULDER ARTHROPLASTY;  Surgeon: Kay Kemps, MD;  Location: [facility] ORS;  Service: Orthopedics;  Laterality: Right;  100   ROTATOR CUFF REPAIR Right 2006   SINUS EXPLORATION  1987   ULNAR NERVE TRANSPOSITION Left 07/19/2019   Procedure: Ulnar nerve release at the elbow with anterior transposition and flexor pronator lengthening, anterior interosseous nerve transfer to the deep motor branch of the ulnar nerve at the forearm, flexor digitorum profundus tendon transfer of the ring and small to middle fingers, ulnar nerve release at the wrist, Guyons canal;  Surgeon: Camella Fallow, MD;  Location: [facility] OR;  Service: Orthoped   ULTRASOUND GUIDANCE FOR VASCULAR ACCESS  01/07/2018   Procedure: Ultrasound Guidance For Vascular Access;  Surgeon: Verlin Lonni BIRCH, MD;  Location: [facility] INVASIVE CV LAB;  Service: Cardiovascular;;    Past Medical History:  Diagnosis Date   Abnormality of gait 02/07/2015   Arthritis    shoulders, pinched nerve around waist and back   Asthma    CAD (coronary artery disease)    Hx of Inf MI treated with CABG in 1999 // Myoview 11/16: mild inf-lat ischemia, EF 34, high risk // LHC 3/19: 3/3 bypass grafts patent   Carotid artery disease    Followed by [facility] (Dr. Serene) // US  in 2/19: bilat ICA 40-59   Chronic systolic CHF (congestive heart failure) (HCC) 01/01/2010   Echo 12/30/2017: Mild LVH, EF 30-35, diffuse HK, grade 1 diastolic dysfunction, mildly reduced RVSF // Echo 04/12/2016:  EF 35-40, PASP 39, mild to moderate TR, mild LAE // Echo (12/14):  Base/mid inferior and inferolateral HK, EF 45%, mild LAE, mildly reduced RVSF   CKD (chronic kidney disease) Stage 3 B    followed by dr peoples q year lov 04-23-2022 on chart   Depression    HLP (hyperkeratosis lenticularis perstans)    HTN (hypertension)    ICD (implantable cardiac defibrillator)  BSx    [employer]   Ischemic cardiomyopathy    Myocardial infarction (HCC) 1999    Near syncope 09/21/2014   Orthostatic hypotension    PAF (paroxysmal atrial fibrillation) (HCC)    Peripheral vascular disease    Wears glasses    Wears partial dentures    upper    Medications:  I have reviewed the patient's current medications.  (Not in a hospital admission)   ALLERGIES:   Allergies  Allergen Reactions   Tramadol Other (See Comments)    Unknown Reaction -  Unsure of reaction     FAM HX: Family History  Problem Relation Age of Onset   Heart disease Mother    Heart attack Mother    Heart disease Father    Heart attack Father    Heart disease Brother    Heart attack Brother     Social  History:   reports that he quit smoking about 24 years ago. His smoking use included pipe. He has never been exposed to tobacco smoke. He has never used smokeless tobacco. He reports that he does not drink alcohol  and does not use drugs.  ROS: 12 system ROS neg except per HPI above  Blood pressure (!) 133/94, pulse 68, temperature 98.6 F (37 C), temperature source Oral, resp. rate (!) 23, weight 69.9 kg, SpO2 100%. PHYSICAL EXAM: Gen: chronically ill appearing man disheveled on stretcher  Eyes:  EOMI ENT: poor dentition, tacky MM Neck: supple, no JVD CV: RRR, no rub appreciated Abd:  soft, mildly TTP through, no rebound or guarding, NABS Back: clear lungs GU: no foley Extr:  no edema Neuro:  AOx3, difficulty with details Skin:  forehead bandaid from recent skin ca removal, L forearm bandaid skin tear, no rashes   Results for orders placed or performed during the hospital encounter of 08/10/24 (from the past 48 hours)  ABO/Rh     Status: None   Collection Time: 08/10/24  5:27 AM  Result Value Ref Range   ABO/RH(D)      O POS Performed at [facility], [address] [place], [place] [address]   Comprehensive metabolic panel     Status: Abnormal   Collection Time: 08/10/24  5:37 AM  Result Value Ref Range   Sodium 136 135 - 145 mmol/L   Potassium  5.1 3.5 - 5.1 mmol/L   Chloride 110 98 - 111 mmol/L   CO2 11 (L) 22 - 32 mmol/L   Glucose, Bld 112 (H) 70 - 99 mg/dL    Comment: Glucose reference range applies only to samples taken after fasting for at least 8 hours.   BUN 153 (H) 8 - 23 mg/dL   Creatinine, Ser 88.40 (H) 0.61 - 1.24 mg/dL   Calcium  8.6 (L) 8.9 - 10.3 mg/dL   Total Protein 6.2 (L) 6.5 - 8.1 g/dL   Albumin  3.7 3.5 - 5.0 g/dL   AST 33 15 - 41 U/L   ALT 32 0 - 44 U/L   Alkaline Phosphatase 50 38 - 126 U/L   Total Bilirubin 0.6 0.0 - 1.2 mg/dL   GFR, Estimated 4 (L) >60 mL/min    Comment: (NOTE) Calculated using the CKD-EPI Creatinine Equation (2021)    Anion gap 15 5 - 15    Comment: Performed at [facility], [address], [place], [place] [address]  CBC with Differential     Status: Abnormal   Collection Time: 08/10/24  5:37 AM  Result Value Ref Range   WBC 7.6 4.0 - 10.5 K/uL   RBC 2.43 (L) 4.22 - 5.81 MIL/uL   Hemoglobin 8.0 (L) 13.0 - 17.0 g/dL   HCT 75.5 (L) 60.9 - 47.9 %   MCV 100.4 (H) 80.0 - 100.0 fL   MCH 32.9 26.0 - 34.0 pg   MCHC 32.8 30.0 - 36.0 g/dL   RDW 87.3 88.4 - 84.4 %   Platelets 134 (L) 150 - 400 K/uL   nRBC 0.0 0.0 - 0.2 %   Neutrophils Relative % 73 %   Neutro Abs 5.5 1.7 - 7.7 K/uL   Lymphocytes Relative 14 %   Lymphs Abs 1.1 0.7 - 4.0 K/uL   Monocytes Relative 11 %   Monocytes Absolute 0.8 0.1 - 1.0 K/uL   Eosinophils Relative 1 %   Eosinophils Absolute 0.1 0.0 - 0.5 K/uL   Basophils Relative 1 %  Basophils Absolute 0.1 0.0 - 0.1 K/uL   Immature Granulocytes 0 %   Abs Immature Granulocytes 0.02 0.00 - 0.07 K/uL    Comment: Performed at [facility], [address] [place], [place] [address]  Protime-INR     Status: Abnormal   Collection Time: 08/10/24  5:37 AM  Result Value Ref Range   Prothrombin Time 16.1 (H) 11.4 - 15.2 seconds   INR 1.2 0.8 - 1.2    Comment: (NOTE) INR goal varies based on device and disease states. Performed at [facility],  [address], [place], [place] [address]   Type and screen [facility]     Status: None   Collection Time: 08/10/24  5:37 AM  Result Value Ref Range   ABO/RH(D) O POS    Antibody Screen NEG    Sample Expiration      08/13/2024,2359 Performed at [facility], [address] [place], [place] [address]   Brain natriuretic peptide     Status: None   Collection Time: 08/10/24  5:37 AM  Result Value Ref Range   B Natriuretic Peptide 96.2 0.0 - 100.0 pg/mL    Comment: Performed at [facility], [address], [place], [place] [address]  Troponin I (High Sensitivity)     Status: Abnormal   Collection Time: 08/10/24  5:37 AM  Result Value Ref Range   Troponin I (High Sensitivity) 95 (H) <18 ng/L    Comment: (NOTE) Elevated high sensitivity troponin I (hsTnI) values and significant  changes across serial measurements may suggest ACS but many other  chronic and acute conditions are known to elevate hsTnI results.  Refer to the Links section for chest pain algorithms and additional  guidance. Performed at [facility], [address] [place], [place] [address]   I-Stat Chem 8, ED     Status: Abnormal   Collection Time: 08/10/24  5:45 AM  Result Value Ref Range   Sodium 141 135 - 145 mmol/L   Potassium 5.3 (H) 3.5 - 5.1 mmol/L   Chloride 112 (H) 98 - 111 mmol/L   BUN >130 (H) 8 - 23 mg/dL   Creatinine, Ser 87.59 (H) 0.61 - 1.24 mg/dL   Glucose, Bld 891 (H) 70 - 99 mg/dL    Comment: Glucose reference range applies only to samples taken after fasting for at least 8 hours.   Calcium , Ion 1.19 1.15 - 1.40 mmol/L   TCO2 12 (L) 22 - 32 mmol/L   Hemoglobin 8.5 (L) 13.0 - 17.0 g/dL   HCT 74.9 (L) 60.9 - 47.9 %  I-Stat Lactic Acid, ED     Status: Abnormal   Collection Time: 08/10/24  5:45 AM  Result Value Ref Range   Lactic Acid, Venous 0.4 (L) 0.5 - 1.9 mmol/L  POC occult blood, ED     Status: None   Collection Time: 08/10/24  5:45 AM  Result Value  Ref Range   Fecal Occult Bld NEGATIVE NEGATIVE    DG Lumbar Spine Complete Result Date: 08/10/2024 EXAM: 5 VIEW(S) XRAY OF THE LUMBAR SPINE 08/10/2024 06:00:58 AM COMPARISON: CT lumbar myelogram dated 07/21/2024 and earlier studies. CLINICAL HISTORY: 83 year old male with lower back pain and weakness. FINDINGS: LUMBAR SPINE: BONES: Stable vertebral height and alignment including s shaped lumbar scoliosis and straightening of lordosis. No acute fracture. No aggressive appearing osseous lesion. DISCS AND DEGENERATIVE CHANGES: Chronic severe lumbar disc and endplate degeneration with diffuse  vacuum disc and endplate spurring. SOFT TISSUES: Calcified abdominal aortic atherosclerosis. No acute abnormality. IMPRESSION: 1. No acute abnormality of the lumbar spine. 2. Chronic severe lumbar disc and endplate degeneration with underlying S-shaped lumbar scoliosis stable from CT myelogram last month. Electronically signed by: Helayne Hurst MD 08/10/2024 06:08 AM EDT RP Workstation: [id number]   DG Chest Port 1 View Result Date: 08/10/2024 EXAM: 1 VIEW(S) XRAY OF THE CHEST 08/10/2024 05:59:33 AM COMPARISON: CT chest, abdomen, and pelvis dated 09/28/2022 and earlier. CLINICAL HISTORY: 83 year old male with dyspnea on exertion. FINDINGS: LUNGS AND PLEURA: No focal pulmonary opacity. No pulmonary edema. No pleural effusion. No pneumothorax. HEART AND MEDIASTINUM: Stable left chest Automatic Implantable Cardioverter-Defibrillator (AICD). Chronic Coronary Artery Bypass Grafting (CABG) and sternotomy. Mediastinal contours are stable and within normal limits. BONES AND SOFT TISSUES: Right total shoulder arthroplasty is new from 2023. IMPRESSION: 1. No acute cardiopulmonary process. Electronically signed by: Helayne Hurst MD 08/10/2024 06:06 AM EDT RP Workstation: [id number]    Assessment/Plan Connor Wilkerson is an 83 y.o. male with HFrEF (last TTE 2019 EF 35%), CAD, ICD, HTN, HL, A fib presenting with weakness and diarrhea x 2  weeks who is found to have severe AKI on CKD for which nephrology is consulted.   **Severe AKI on CKD 4:  3 wk history of symptoms - unclear if this is prerenal state leading to AKI on CKD vs progressive CKD presenting with uremic symptoms.  Checking UA, urine electrolytes, CK, non contrasted CT abd/pelvis.  With mild thrombocytopenia smear being checked - if schistocytes will need to do TMA w/u; low susp for TTP. No indications for RRT today - will hydrate with isotonic fluids and follow labs closely.  Hold ARB.  Discussed possible need for dialysis if this does not improve with IVF - wife in favor, patient noncommittal and asked they discuss.  Avoid extremes of BP and nephrotoxins.  Strict I/Os, daily weights.  Qshift PVR today.   **HFrEF:  appearing hypovolemic.  Hold entresto  in setting of severe AKI.    **HTN: hold entresto .  Initial BP 150s but last BP 108 - hold other meds for now.  Has rec'd 0.5L bolus = rebolus PRN low BP  **NAGMA: in setting of severe AKI.  Starting bicarb gtt.   **Hyperkalemia, K 5.3, mild, lokelma 5g now.   **thrombocytopenia:  mild, smear being checked - if schistocytes will need to proceed down TMA w/u.   **Anemia:  macrocytic, checking iron, B12, SPEP, UPEP, SFLC.  Hb in 8s, transfuse < 7.   **Falls/debility:  r/o uremia, other w/u per primary  Will follow reach out with cocnerns.   Connor Wilkerson 08/10/2024, 7:55 AM

## 2024-08-10 NOTE — Progress Notes (Signed)
 Date: 08/10/2024         Patient Name:  Connor Wilkerson MRN: [id number]  DOB: 08/07/1941 Age / Sex: 84 y.o., male   PCP: Keren Vicenta BRAVO, MD         Medical Service: Internal Medicine Teaching Service         Attending Physician: Dr. Trudy Mliss Dragon, MD    First Contact: Dr. Bernadine Pager: [phone]  Second Contact: Dr. Heddy Pager: [phone]                 Chief Concern: Back pain, difficulty transferring from chair to bed  History of Present Illness: 83 year old came to ED by ambulance for worsening back pain and immobility. He is accompanied by his wife, Holley, and a friend.  He has been experiencing severe back pain for seven to eight months, primarily located at the belt line on the left side. The pain is described as burning and worsens with movement, particularly when trying to walk. He had a fall in the front yard about three weeks ago.   He is experiencing decreased urine output, with the last attempt to urinate at 4:30 AM resulting in only a small amount. He is unsure how long this has been occurring. He has a history of seeing a nephrologist, Dr. Dalene, but is unaware of any specific kidney disease diagnosis. His father had kidney disease, but details are unknown.  He reports a lack of appetite for the past couple of weeks and has not been eating. No diarrhea, vomiting, or abdominal pain. He has experienced intermittent chills but no consistent fever.  He has a history of two heart attacks but denies any history of stroke, liver disease, lung disease, diabetes, or high blood pressure. He takes Tylenol  for pain and has hydrocodone  prescribed. He denies the use of NSAIDs or recent antibiotics.  He has a history of smoking and alcohol  use but has been sober and smoke-free for over forty years. He enjoys playing with his Advertising account planner, Cookie, but has been unable to do so for the past week.  In the ED workup was notable for creatinine to ~11 from baseline  around 2, hemoglobin of 8 down from 11.3 ten months ago. Bedside ultrasound didn't show bladder distention or hydronephrosis. PVR was measured ~60. IV fluids were started in case of pre-renal AKI and nephrology was consulted. Since arrival ~4 hours ago he has urinated 300 mL.  ROS per above.   Allergies: Allergies  Allergen Reactions   Tramadol Other (See Comments)    Unknown Reaction -  Unsure of reaction     Past Medical History: Patient Active Problem List   Diagnosis Date Noted   Acute renal failure 08/10/2024   Malaise 08/10/2024   Back pain 08/10/2024   Macrocytic anemia 08/10/2024   Thrombocytopenia 08/10/2024   PAD (peripheral artery disease) 08/10/2024   Encephalopathy 08/10/2024   Cervical radiculopathy 06/02/2024   Lesion of ulnar nerve 06/02/2024   Osteoarthritis 06/02/2024   Shoulder pain 06/02/2024   Skin sensation disturbance 06/02/2024   S/P shoulder replacement, right 10/16/2023   Carotid artery disease 08/06/2022   PVC (premature ventricular contraction) 04/04/2022   Orthostatic hypotension 08/27/2020   Atrial fibrillation (HCC) 02/29/2020   Hypercoagulable state 02/29/2020   CAD (coronary artery disease) 12/07/2018   Hyperlipidemia 12/07/2018   CKD (chronic kidney disease) stage 3, GFR 30-59 ml/min (HCC) 12/07/2018   Ischemic cardiomyopathy    Essential hypertension 09/22/2016   Restless leg  syndrome 07/14/2016   AKI (acute kidney injury) 04/11/2016   Hypotension 04/11/2016   Abnormality of gait 02/07/2015   Near syncope 09/21/2014   Repeated falls 12/15/2013   OSA (obstructive sleep apnea) 12/15/2013   Chronic systolic CHF (congestive heart failure) (HCC) 01/01/2010   CARDIOMYOPATHY, ISCHEMIC 12/31/2009   DYSPNEA 12/31/2009   Implantable cardioverter-defibrillator (ICD) in situ 12/31/2009   Past Medical History:  Diagnosis Date   Abnormality of gait 02/07/2015   Arthritis    shoulders, pinched nerve around waist and back   Asthma    CAD  (coronary artery disease)    Hx of Inf MI treated with CABG in 1999 // Myoview 11/16: mild inf-lat ischemia, EF 34, high risk // LHC 3/19: 3/3 bypass grafts patent   Carotid artery disease    Followed by [facility] (Dr. Serene) // US  in 2/19: bilat ICA 40-59   Chronic systolic CHF (congestive heart failure) (HCC) 01/01/2010   Echo 12/30/2017: Mild LVH, EF 30-35, diffuse HK, grade 1 diastolic dysfunction, mildly reduced RVSF // Echo 04/12/2016:  EF 35-40, PASP 39, mild to moderate TR, mild LAE // Echo (12/14):  Base/mid inferior and inferolateral HK, EF 45%, mild LAE, mildly reduced RVSF   CKD (chronic kidney disease) Stage 3 B    followed by dr peoples q year lov 04-23-2022 on chart   Depression    HLP (hyperkeratosis lenticularis perstans)    HTN (hypertension)    ICD (implantable cardiac defibrillator)  BSx    [employer]   Ischemic cardiomyopathy    Myocardial infarction (HCC) 1999   Near syncope 09/21/2014   Orthostatic hypotension    PAF (paroxysmal atrial fibrillation) (HCC)    Peripheral vascular disease    Wears glasses    Wears partial dentures    upper    Medications: No current facility-administered medications on file prior to encounter.   Current Outpatient Medications on File Prior to Encounter  Medication Sig Dispense Refill   acetaminophen  (TYLENOL ) 500 MG tablet Take 500-1,000 mg by mouth every 6 (six) hours as needed for moderate pain (pain score 4-6).     amLODipine  (NORVASC ) 2.5 MG tablet TAKE 2 TABLETS BY MOUTH DAILY (Patient taking differently: Take 2.5 mg by mouth daily.) 180 tablet 3   apixaban  (ELIQUIS ) 2.5 MG TABS tablet Take 1 tablet (2.5 mg total) by mouth 2 (two) times daily. 180 tablet 1   buPROPion  (WELLBUTRIN  SR) 150 MG 12 hr tablet Take 300 mg by mouth daily.     escitalopram  (LEXAPRO ) 10 MG tablet Take 10 mg by mouth daily.     famotidine  (PEPCID ) 20 MG tablet Take 20 mg by mouth 2 (two) times daily.     gabapentin  (NEURONTIN ) 300 MG capsule Take 300  mg by mouth at bedtime.     lidocaine  (LIDODERM ) 5 % Place 1 patch onto the skin daily as needed (Pain).     mupirocin  ointment (BACTROBAN ) 2 % Apply 1 Application topically 2 (two) times daily. Head/Wrist     nitroGLYCERIN  (NITROSTAT ) 0.4 MG SL tablet Place 1 tablet (0.4 mg total) under the tongue every 5 (five) minutes as needed for chest pain. 25 tablet 11   rosuvastatin  (CRESTOR ) 40 MG tablet TAKE 1 TABLET BY MOUTH EVERY DAY 90 tablet 2   sacubitril -valsartan  (ENTRESTO ) 24-26 MG TAKE 1/2 TABLET TWICE A DAY BY MOUTH 180 tablet 2   traZODone  (DESYREL ) 50 MG tablet Take 50 mg by mouth at bedtime as needed for sleep.  Surgical History: Past Surgical History:  Procedure Laterality Date   AMPUTATION TOE Left 06/17/2023   Procedure: LEFT 2ND TOE AMPUTATION THRU PROXIMAL PHALANX;  Surgeon: Barton Drape, MD;  Location: [facility] ;  Service: Orthopedics;  Laterality: Left;   CARDIAC CATHETERIZATION N/A 12/11/2015   Procedure: Left Heart Cath and Coronary Angiography;  Surgeon: Rober Chroman, MD;  Location: [facility] INVASIVE CV LAB;  Service: Cardiovascular;  Laterality: N/A;   CARDIAC DEFIBRILLATOR PLACEMENT     [employer]   CARDIAC DEFIBRILLATOR PLACEMENT  04/08/2016   NOT MRI SAFE (DOCUMENT SCANNED IN SYSTEM   COLONOSCOPY  10/06/2007   Mild sigmoid diverticulosis. Small internal hemorrhoids.    CORONARY ARTERY BYPASS GRAFT     ELBOW SURGERY Left    plastic bone replacement   HERNIA REPAIR     x2   LEFT HEART CATH AND CORS/GRAFTS ANGIOGRAPHY N/A 01/07/2018   Procedure: LEFT HEART CATH AND CORS/GRAFTS ANGIOGRAPHY;  Surgeon: Verlin Lonni BIRCH, MD;  Location: [facility] INVASIVE CV LAB;  Service: Cardiovascular;  Laterality: N/A;   PERMANENT PACEMAKER GENERATOR CHANGE N/A 11/25/2013   Procedure: PERMANENT PACEMAKER GENERATOR CHANGE;  Surgeon: Elspeth JAYSON Sage, MD;  Location: [facility] CATH LAB;  Service: Cardiovascular;  Laterality: N/A;    REVERSE SHOULDER ARTHROPLASTY Right  10/16/2023   Procedure: REVERSE SHOULDER ARTHROPLASTY;  Surgeon: Kay Kemps, MD;  Location: [facility]S;  Service: Orthopedics;  Laterality: Right;  100   ROTATOR CUFF REPAIR Right 2006   SINUS EXPLORATION  1987   ULNAR NERVE TRANSPOSITION Left 07/19/2019   Procedure: Ulnar nerve release at the elbow with anterior transposition and flexor pronator lengthening, anterior interosseous nerve transfer to the deep motor branch of the ulnar nerve at the forearm, flexor digitorum profundus tendon transfer of the ring and small to middle fingers, ulnar nerve release at the wrist, Guyons canal;  Surgeon: Camella Fallow, MD;  Location: [facility] OR;  Service: Orthoped   ULTRASOUND GUIDANCE FOR VASCULAR ACCESS  01/07/2018   Procedure: Ultrasound Guidance For Vascular Access;  Surgeon: Verlin Lonni BIRCH, MD;  Location: [facility] INVASIVE CV LAB;  Service: Cardiovascular;;    Family History:  Family History  Problem Relation Age of Onset   Heart disease Mother    Heart attack Mother    Heart disease Father    Heart attack Father    Heart disease Brother    Heart attack Brother     Social History:  Social History   Socioeconomic History   Marital status: Married    Spouse name: Not on file   Number of children: 1   Years of education: HS +   Highest education level: Not on file  Occupational History   Occupation: retired  Tobacco Use   Smoking status: Former    Types: Pipe    Quit date: 09/19/1999    Years since quitting: 24.9    Passive exposure: Never   Smokeless tobacco: Never   Tobacco comments:    quit in 1998  Vaping Use   Vaping status: Never Used  Substance and Sexual Activity   Alcohol  use: No    Alcohol /week: 0.0 standard drinks of alcohol     Comment: recovering alcoholic, quit 67yrs ago (2000)   Drug use: No   Sexual activity: Not on file  Other Topics Concern   Not on file  Social History Narrative   Lives at home w/ his wife.   Patient is right handed.   Patient drinks about 3  cups of soda daily.  Social Drivers of Corporate investment banker Strain: Not on file  Food Insecurity: No Food Insecurity (10/16/2023)   Hunger Vital Sign    Worried About Running Out of Food in the Last Year: Never true    Ran Out of Food in the Last Year: Never true  Transportation Needs: No Transportation Needs (10/16/2023)   PRAPARE - Administrator, Civil Service (Medical): No    Lack of Transportation (Non-Medical): No  Physical Activity: Not on file  Stress: Not on file  Social Connections: Not on file  Intimate Partner Violence: Not At Risk (10/16/2023)   Humiliation, Afraid, Rape, and Kick questionnaire    Fear of Current or Ex-Partner: No    Emotionally Abused: No    Physically Abused: No    Sexually Abused: No      Physical Exam: Blood pressure (!) 133/94, pulse 68, temperature 98.6 F (37 C), temperature source Oral, resp. rate (!) 23, weight 69.9 kg, SpO2 100%.  Somewhat frail-appearing Moist pink oral mucosa Heart rate normal, rhythm regular, no audible murmurs, strong radial pulses, warm extremities, no LE edema Breathing comfortably on room air, slight bibasilar crackles Decreased skin turgor Abdomen soft and non-tender, no suprapubic or CVA tenderness Alert and oriented to person, answers questions appropriately, follows simple instructions, some diminished sensation left leg compared to right, no pronator drift, bilateral hand tremor present, speech normal, no facial asymmetry, normal eye movements and pupils  EKG:  Sinus rhythm 70 bpm with TWI/biphasic TW in inferolateral leads, similar to prior  Labs: Per above. BUN 153 UA with small glucose and protein, moderate hemoglobin but no RBC or WBC under microscope, mucus and granular casts present, amorphous crystals present Spot urine sodium 45 Prior studies w/ creatinine level reflecting CKD 3a-b  Images and other studies: CXR without pulmonary infiltrates or edema/effusion Lumbar spine  x-ray without acute abnormality  Assessment & Plan:  DVID PENDRY is a 83 y.o. with ischemic cardiomyopathy, chronic back pain on opioids, paroxysmal a-fib on DOAC who presents after weeks of malaise and found to have acute renal failure with suspected oliguria.  Principal Problem:   Acute renal failure With suspected oliguria, although not oliguric since arrival to ED based on output of ~300 mL over 4 hours. History and exam suggests pre-renal disease due to poor PO intake. No urinary retention or hydronephrosis rules out obstructive uropathy. UA largely bland, makes intrinsic disease less likely. No NSAID use outside of hospital. Nephrology to see in consult. Continue bicarbonate infusion for NAGMA. Hold BP medicine/RAAS blockers. Renally dose medications. Monitor I/O. CT a/p wo contrast per nephrology. Check CK. Trend creatinine.   Active Problems:   Ischemic cardiomyopathy Chronic, stable. Seems dry by history and exam. IV fluid administration of presumed pre-renal AKI per above. Monitor volume status. Holding outpatient Entresto .    Atrial fibrillation (HCC) Chronic, paroxysmal. Low a-fib burden by ICD interrogation. Rhythm is sinus now. Resume outpatient Eliquis . -Eliquis  2.5 mg BID    Hypercoagulable state Secondary due to a-fib. Eliquis  per above.    Malaise   Encephalopathy With fatigue, decreased PO intake, decreased mobility, altered mental status of late. Suspect this is from uremia. Monitor clinically for improvement with treatment for renal insufficiency.    Back pain Chronic, stable. Due to degenerative spinal changes without myelopathy. -acetaminophen  1000 mg q6 h while awake    Macrocytic anemia   Thrombocytopenia No overt signs/symptoms of bleeding. No history of cirrhosis. No indication for transfusion at present. -iron,  TIBC, ferritin -LDH -B12 -immature platelet fraction -peripheral smear review -SPEP/UPEP -kappa/lambda light chains    PAD (peripheral  artery disease) Chronic, stable. With bilateral lower extremity and bilateral carotid disease. No on antiplatelets outside of hospital. Resume outpatient rosuvastatin  at reduced dose. -rosuvastatin  10 mg daily  Level of care: med-surg Diet: regular IVF: sodium bicarbonate infusion VTE: apixaban  (ELIQUIS ) tablet 2.5 mg Start: 08/10/24 1000 Code: full, but wouldn't want life-sustaining measures if he couldn't be liberated from ventilator Surrogate: wife, at bedside  Signed: Ozell Kung MD 08/10/2024, 9:02 AM Pager: [phone]

## 2024-08-10 NOTE — Telephone Encounter (Signed)
 Wife called to say that the patient is in the hospital and is asking Dr. Court to check in on him if he could. Please advise

## 2024-08-10 NOTE — ED Triage Notes (Signed)
 PT arrives from home via EMS with a complaint for weakness, chills, and diarrhea for the past 2 weeks.

## 2024-08-11 DIAGNOSIS — N184 Chronic kidney disease, stage 4 (severe): Secondary | ICD-10-CM

## 2024-08-11 DIAGNOSIS — E43 Unspecified severe protein-calorie malnutrition: Secondary | ICD-10-CM | POA: Insufficient documentation

## 2024-08-11 DIAGNOSIS — D649 Anemia, unspecified: Secondary | ICD-10-CM

## 2024-08-11 DIAGNOSIS — E785 Hyperlipidemia, unspecified: Secondary | ICD-10-CM

## 2024-08-11 DIAGNOSIS — M47816 Spondylosis without myelopathy or radiculopathy, lumbar region: Secondary | ICD-10-CM

## 2024-08-11 DIAGNOSIS — N179 Acute kidney failure, unspecified: Secondary | ICD-10-CM

## 2024-08-11 DIAGNOSIS — M47814 Spondylosis without myelopathy or radiculopathy, thoracic region: Secondary | ICD-10-CM

## 2024-08-11 DIAGNOSIS — R638 Other symptoms and signs concerning food and fluid intake: Secondary | ICD-10-CM

## 2024-08-11 DIAGNOSIS — I10 Essential (primary) hypertension: Secondary | ICD-10-CM

## 2024-08-11 DIAGNOSIS — Z85810 Personal history of malignant neoplasm of tongue: Secondary | ICD-10-CM

## 2024-08-11 DIAGNOSIS — D696 Thrombocytopenia, unspecified: Secondary | ICD-10-CM

## 2024-08-11 DIAGNOSIS — M5126 Other intervertebral disc displacement, lumbar region: Secondary | ICD-10-CM

## 2024-08-11 LAB — RENAL FUNCTION PANEL
Albumin: 3.1 g/dL — ABNORMAL LOW (ref 3.5–5.0)
Anion gap: 15 (ref 5–15)
BUN: 146 mg/dL — ABNORMAL HIGH (ref 8–23)
CO2: 18 mmol/L — ABNORMAL LOW (ref 22–32)
Calcium: 8.3 mg/dL — ABNORMAL LOW (ref 8.9–10.3)
Chloride: 106 mmol/L (ref 98–111)
Creatinine, Ser: 10.9 mg/dL — ABNORMAL HIGH (ref 0.61–1.24)
GFR, Estimated: 4 mL/min — ABNORMAL LOW (ref >60)
Glucose, Bld: 130 mg/dL — ABNORMAL HIGH (ref 70–99)
Phosphorus: 7.7 mg/dL — ABNORMAL HIGH (ref 2.5–4.6)
Potassium: 4.5 mmol/L (ref 3.5–5.1)
Sodium: 139 mmol/L (ref 135–145)

## 2024-08-11 LAB — PROTEIN / CREATININE RATIO, URINE
Creatinine, Urine: 52 mg/dL
Protein Creatinine Ratio: 0.9 mg/mg{creat} — ABNORMAL HIGH (ref 0.00–0.15)
Total Protein, Urine: 47 mg/dL

## 2024-08-11 LAB — CBC
HCT: 20.5 % — ABNORMAL LOW (ref 39.0–52.0)
Hemoglobin: 7 g/dL — ABNORMAL LOW (ref 13.0–17.0)
MCH: 32.7 pg (ref 26.0–34.0)
MCHC: 34.1 g/dL (ref 30.0–36.0)
MCV: 95.8 fL (ref 80.0–100.0)
Platelets: 125 K/uL — ABNORMAL LOW (ref 150–400)
RBC: 2.14 MIL/uL — ABNORMAL LOW (ref 4.22–5.81)
RDW: 12.3 % (ref 11.5–15.5)
WBC: 6.7 K/uL (ref 4.0–10.5)
nRBC: 0 % (ref 0.0–0.2)

## 2024-08-11 LAB — HEPATITIS B SURFACE ANTIGEN: Hepatitis B Surface Ag: NONREACTIVE

## 2024-08-11 LAB — FERRITIN: Ferritin: 162 ng/mL (ref 24–336)

## 2024-08-11 LAB — IRON AND TIBC
Iron: 137 ug/dL (ref 45–182)
Saturation Ratios: 69 % — ABNORMAL HIGH (ref 17.9–39.5)
TIBC: 199 ug/dL — ABNORMAL LOW (ref 250–450)
UIBC: 62 ug/dL

## 2024-08-11 LAB — FOLATE: Folate: 10 ng/mL (ref >5.9)

## 2024-08-11 LAB — VITAMIN B12: Vitamin B-12: 249 pg/mL (ref 180–914)

## 2024-08-11 MED ORDER — CEFAZOLIN SODIUM-DEXTROSE 2-4 GM/100ML-% IV SOLN: 2.0000 g | INTRAVENOUS | Status: AC

## 2024-08-11 MED ORDER — CHLORHEXIDINE GLUCONATE CLOTH 2 % EX PADS
6.0000 | MEDICATED_PAD | Freq: Every day | CUTANEOUS | Status: DC
  Administered 2024-08-11 – 2024-08-14 (×4): 6 via TOPICAL

## 2024-08-11 NOTE — Progress Notes (Signed)
 Notified that [facility] will be placed by IR tomorrow. -regular diet tonight -npo at midnight -eliquis  held, restart after [facility] placement

## 2024-08-11 NOTE — Plan of Care (Signed)

## 2024-08-11 NOTE — Consult Note (Signed)
 Chief Complaint: Patient was seen in consultation today for tunneled dialysis catheter placement for chronic kidney disease at the request of Connor Barters, MD.  Referring Physician(s): Connor Barters, MD  Supervising Physician: Connor Wilkerson  Patient Status: [name] - In-pt  History of Present Illness: Connor Wilkerson is a 83 y.o. male with medical history significant for CKD stage 4, CAD, CHF, asthma, HTN, ICD, PAF (on Eliquis ), and PVD. He presented to the ED on 08/10/2024 with c/o weakness and diarrhea for 2 weeks. He was found to have severe AKI on CKD for which nephrology is following. In the ED, his creatinine was 11.59 and GFR 4.  Received request for tunneled dialysis catheter to be placed by Interventional Radiology.   Past Medical History:  Diagnosis Date   Abnormality of gait 02/07/2015   Arthritis    shoulders, pinched nerve around waist and back   Asthma    CAD (coronary artery disease)    Hx of Inf MI treated with CABG in 1999 // Myoview 11/16: mild inf-lat ischemia, EF 34, high risk // LHC 3/19: 3/3 bypass grafts patent   Carotid artery disease    Followed by [facility] (Dr. Serene) // US  in 2/19: bilat ICA 40-59   Chronic systolic CHF (congestive heart failure) (HCC) 01/01/2010   Echo 12/30/2017: Mild LVH, EF 30-35, diffuse HK, grade 1 diastolic dysfunction, mildly reduced RVSF // Echo 04/12/2016:  EF 35-40, PASP 39, mild to moderate TR, mild LAE // Echo (12/14):  Base/mid inferior and inferolateral HK, EF 45%, mild LAE, mildly reduced RVSF   CKD (chronic kidney disease) Stage 3 B    followed by dr peoples q year lov 04-23-2022 on chart   Depression    HLP (hyperkeratosis lenticularis perstans)    HTN (hypertension)    ICD (implantable cardiac defibrillator)  BSx    [employer]   Ischemic cardiomyopathy    Myocardial infarction (HCC) 1999   Near syncope 09/21/2014   Orthostatic hypotension    PAF (paroxysmal atrial fibrillation) (HCC)    Peripheral vascular  disease    Wears glasses    Wears partial dentures    upper    Past Surgical History:  Procedure Laterality Date   AMPUTATION TOE Left 06/17/2023   Procedure: LEFT 2ND TOE AMPUTATION THRU PROXIMAL PHALANX;  Surgeon: Barton Drape, MD;  Location: [facility];  Service: Orthopedics;  Laterality: Left;   CARDIAC CATHETERIZATION N/A 12/11/2015   Procedure: Left Heart Cath and Coronary Angiography;  Surgeon: Rober Chroman, MD;  Location: [facility] INVASIVE CV LAB;  Service: Cardiovascular;  Laterality: N/A;   CARDIAC DEFIBRILLATOR PLACEMENT     [employer]   CARDIAC DEFIBRILLATOR PLACEMENT  04/08/2016   NOT MRI SAFE (DOCUMENT SCANNED IN SYSTEM   COLONOSCOPY  10/06/2007   Mild sigmoid diverticulosis. Small internal hemorrhoids.    CORONARY ARTERY BYPASS GRAFT     ELBOW SURGERY Left    plastic bone replacement   HERNIA REPAIR     x2   LEFT HEART CATH AND CORS/GRAFTS ANGIOGRAPHY N/A 01/07/2018   Procedure: LEFT HEART CATH AND CORS/GRAFTS ANGIOGRAPHY;  Surgeon: Verlin Lonni BIRCH, MD;  Location: [facility] INVASIVE CV LAB;  Service: Cardiovascular;  Laterality: N/A;   PERMANENT PACEMAKER GENERATOR CHANGE N/A 11/25/2013   Procedure: PERMANENT PACEMAKER GENERATOR CHANGE;  Surgeon: Elspeth JAYSON Sage, MD;  Location: [facility] CATH LAB;  Service: Cardiovascular;  Laterality: N/A;    REVERSE SHOULDER ARTHROPLASTY Right 10/16/2023   Procedure: REVERSE SHOULDER ARTHROPLASTY;  Surgeon: Kay Kemps,  MD;  Location: [facility] ORS;  Service: Orthopedics;  Laterality: Right;  100   ROTATOR CUFF REPAIR Right 2006   SINUS EXPLORATION  1987   ULNAR NERVE TRANSPOSITION Left 07/19/2019   Procedure: Ulnar nerve release at the elbow with anterior transposition and flexor pronator lengthening, anterior interosseous nerve transfer to the deep motor branch of the ulnar nerve at the forearm, flexor digitorum profundus tendon transfer of the ring and small to middle fingers, ulnar nerve release at the wrist, Guyons canal;   Surgeon: Camella Fallow, MD;  Location: [facility] OR;  Service: Orthoped   ULTRASOUND GUIDANCE FOR VASCULAR ACCESS  01/07/2018   Procedure: Ultrasound Guidance For Vascular Access;  Surgeon: Verlin Lonni BIRCH, MD;  Location: [facility] INVASIVE CV LAB;  Service: Cardiovascular;;    Allergies: Tramadol  Medications: Prior to Admission medications   Medication Sig Start Date End Date Taking? Authorizing Provider  acetaminophen  (TYLENOL ) 500 MG tablet Take 500-1,000 mg by mouth every 6 (six) hours as needed for moderate pain (pain score 4-6).   Yes [provider]  amLODipine  (NORVASC ) 2.5 MG tablet TAKE 2 TABLETS BY MOUTH DAILY Patient taking differently: Take 2.5 mg by mouth daily. 03/23/24  Yes Fernande Elspeth BROCKS, MD  apixaban  (ELIQUIS ) 2.5 MG TABS tablet Take 1 tablet (2.5 mg total) by mouth 2 (two) times daily. 06/16/24  Yes Court Dorn PARAS, MD  buPROPion  (WELLBUTRIN  SR) 150 MG 12 hr tablet Take 300 mg by mouth daily. 05/21/23  Yes [provider]  escitalopram  (LEXAPRO ) 10 MG tablet Take 10 mg by mouth daily.   Yes [provider]  famotidine  (PEPCID ) 20 MG tablet Take 20 mg by mouth 2 (two) times daily.   Yes [provider]  gabapentin  (NEURONTIN ) 300 MG capsule Take 300 mg by mouth at bedtime.   Yes [provider]  lidocaine  (LIDODERM ) 5 % Place 1 patch onto the skin daily as needed (Pain). 07/13/24  Yes [provider]  mupirocin  ointment (BACTROBAN ) 2 % Apply 1 Application topically 2 (two) times daily. Head/Wrist 08/01/24  Yes [provider]  nitroGLYCERIN  (NITROSTAT ) 0.4 MG SL tablet Place 1 tablet (0.4 mg total) under the tongue every 5 (five) minutes as needed for chest pain. 12/11/22  Yes Fernande Elspeth BROCKS, MD  rosuvastatin  (CRESTOR ) 40 MG tablet TAKE 1 TABLET BY MOUTH EVERY DAY 11/24/23  Yes Fernande Elspeth BROCKS, MD  sacubitril -valsartan  (ENTRESTO ) 24-26 MG TAKE 1/2 TABLET TWICE A DAY BY MOUTH 05/03/24  Yes Court Dorn PARAS, MD  traZODone   (DESYREL ) 50 MG tablet Take 50 mg by mouth at bedtime as needed for sleep. 12/21/19  Yes [provider]     Family History  Problem Relation Age of Onset   Heart disease Mother    Heart attack Mother    Heart disease Father    Heart attack Father    Heart disease Brother    Heart attack Brother     Social History   Socioeconomic History   Marital status: Married    Spouse name: Not on file   Number of children: 1   Years of education: HS +   Highest education level: Not on file  Occupational History   Occupation: retired  Tobacco Use   Smoking status: Former    Types: Pipe    Quit date: 09/19/1999    Years since quitting: 24.9    Passive exposure: Never   Smokeless tobacco: Never   Tobacco comments:    quit in 1998  Vaping  Use   Vaping status: Never Used  Substance and Sexual Activity   Alcohol  use: No    Alcohol /week: 0.0 standard drinks of alcohol     Comment: recovering alcoholic, quit 4yrs ago (2000)   Drug use: No   Sexual activity: Not on file  Other Topics Concern   Not on file  Social History Narrative   Lives at home w/ his wife.   Patient is right handed.   Patient drinks about 3 cups of soda daily.   Social Drivers of Corporate investment banker Strain: Not on file  Food Insecurity: No Food Insecurity (08/10/2024)   Hunger Vital Sign    Worried About Running Out of Food in the Last Year: Never true    Ran Out of Food in the Last Year: Never true  Transportation Needs: No Transportation Needs (08/10/2024)   PRAPARE - Administrator, Civil Service (Medical): No    Lack of Transportation (Non-Medical): No  Physical Activity: Not on file  Stress: Not on file  Social Connections: Patient Declined (08/10/2024)   Social Connection and Isolation Panel    Frequency of Communication with Friends and Family: Patient declined    Frequency of Social Gatherings with Friends and Family: Patient declined    Attends Religious Services:  Patient declined    Database administrator or Organizations: Patient declined    Attends Banker Meetings: Patient declined    Marital Status: Patient declined    Review of Systems: A 12 point ROS discussed and pertinent positives are indicated in the HPI above.  All other systems are negative.  Review of Systems  Unable to perform ROS: Mental status change    Vital Signs: BP (!) 166/79   Pulse 73   Temp 98.1 F (36.7 C) (Oral)   Resp 18   Wt 145 lb 8.1 oz (66 kg)   SpO2 100%   BMI 20.88 kg/m   Advance Care Plan: no documents on file   Physical Exam Constitutional:      General: He is not in acute distress.    Comments: Frail-appearing  Eyes:     Pupils: Pupils are equal, round, and reactive to light.  Cardiovascular:     Rate and Rhythm: Normal rate and regular rhythm.  Pulmonary:     Effort: Pulmonary effort is normal. No respiratory distress.  Skin:    General: Skin is warm and dry.  Neurological:     Mental Status: He is alert. He is disoriented.     Imaging: CT ABDOMEN PELVIS WO CONTRAST Result Date: 08/10/2024 EXAM: CT ABDOMEN AND PELVIS WITHOUT CONTRAST 08/10/2024 10:44:36 AM TECHNIQUE: CT of the abdomen and pelvis was performed without the administration of intravenous contrast. Multiplanar reformatted images are provided for review. Automated exposure control, iterative reconstruction, and/or weight-based adjustment of the mA/kV was utilized to reduce the radiation dose to as low as reasonably achievable. COMPARISON: CT dated 03/23/23. CLINICAL HISTORY: Abdominal pain. FINDINGS: LOWER CHEST: Mild dependent atelectasis in the lower lobes. Aicd leads noted. LIVER: The liver is unremarkable. GALLBLADDER AND BILE DUCTS: Gallbladder is unremarkable. No biliary ductal dilatation. SPLEEN: No acute abnormality. PANCREAS: No acute abnormality. ADRENAL GLANDS: No acute abnormality. KIDNEYS, URETERS AND BLADDER: The small exophytic region of concern from the  left kidney upper pole shown on prior exams is obscured by motion artifact on today's exams but appears roughly similar to previous. This lesion remains indeterminate. No stones in the kidneys or ureters. No hydronephrosis. No  perinephric or periureteral stranding. Urinary bladder is unremarkable. GI AND BOWEL: Scattered sigmoid colon diverticula. Stomach demonstrates no acute abnormality. There is no bowel obstruction. PERITONEUM AND RETROPERITONEUM: No ascites. No free air. VASCULATURE: Systemic atherosclerosis is present, including the aorta and iliac arteries. Coronary and aortic atherosclerosis. LYMPH NODES: No lymphadenopathy. REPRODUCTIVE ORGANS: No acute abnormality. BONES AND SOFT TISSUES: Dextroconvex lumbar scoliosis with rotary component. Thoracic and lumbar spondylosis and degenerative disc disease with resulting moderate right foraminal impingement at L2-L3, L3-L4, and L4-L5, and moderate left foraminal impingement at L2-L3. Mild grade 1 degenerative retrolisthesis at all levels between L1 and S1. Acetabular labral chondrocalcinosis bilaterally. Small left groin hernia contains adipose tissue. No acute osseous abnormality. No focal soft tissue abnormality. IMPRESSION: 1. Small exophytic lesion at the left renal upper pole remains indeterminate due to motion artifact. 2. No acute findings in the abdomen or pelvis. 3. Systemic atherosclerosis, including aorta and iliac arteries. 4. Coronary atherosclerosis. 5. Thoracic and lumbar spondylosis and degenerative disc disease with moderate right foraminal impingement at L3-L4 and L4-L5, and moderate left foraminal impingement at L2-L3. 6. Mild grade 1 degenerative retrolisthesis from L1 to S1. 7. Acetabular labral chondrocalcinosis bilaterally. 8. Dextroconvex lumbar scoliosis with rotary component. Electronically signed by: Ryan Salvage MD 08/10/2024 11:15 AM EDT RP Workstation: [id number]   DG Lumbar Spine Complete Result Date: 08/10/2024 EXAM: 5  VIEW(S) XRAY OF THE LUMBAR SPINE 08/10/2024 06:00:58 AM COMPARISON: CT lumbar myelogram dated 07/21/2024 and earlier studies. CLINICAL HISTORY: 83 year old male with lower back pain and weakness. FINDINGS: LUMBAR SPINE: BONES: Stable vertebral height and alignment including s shaped lumbar scoliosis and straightening of lordosis. No acute fracture. No aggressive appearing osseous lesion. DISCS AND DEGENERATIVE CHANGES: Chronic severe lumbar disc and endplate degeneration with diffuse vacuum disc and endplate spurring. SOFT TISSUES: Calcified abdominal aortic atherosclerosis. No acute abnormality. IMPRESSION: 1. No acute abnormality of the lumbar spine. 2. Chronic severe lumbar disc and endplate degeneration with underlying S-shaped lumbar scoliosis stable from CT myelogram last month. Electronically signed by: Helayne Hurst MD 08/10/2024 06:08 AM EDT RP Workstation: [id number]   DG Chest Port 1 View Result Date: 08/10/2024 EXAM: 1 VIEW(S) XRAY OF THE CHEST 08/10/2024 05:59:33 AM COMPARISON: CT chest, abdomen, and pelvis dated 09/28/2022 and earlier. CLINICAL HISTORY: 83 year old male with dyspnea on exertion. FINDINGS: LUNGS AND PLEURA: No focal pulmonary opacity. No pulmonary edema. No pleural effusion. No pneumothorax. HEART AND MEDIASTINUM: Stable left chest Automatic Implantable Cardioverter-Defibrillator (AICD). Chronic Coronary Artery Bypass Grafting (CABG) and sternotomy. Mediastinal contours are stable and within normal limits. BONES AND SOFT TISSUES: Right total shoulder arthroplasty is new from 2023. IMPRESSION: 1. No acute cardiopulmonary process. Electronically signed by: Helayne Hurst MD 08/10/2024 06:06 AM EDT RP Workstation: [id number]   CT LUMBAR SPINE W CONTRAST Result Date: 07/26/2024 EXAM: CT OF THE LUMBAR SPINE WITH CONTRAST 07/21/2024 01:52:57 PM TECHNIQUE: CT of the lumbar spine was performed with the administration of intravenous contrast. Multiplanar reformatted images are provided for  review. Automated exposure control, iterative reconstruction, and/or weight based adjustment of the mA/kV was utilized to reduce the radiation dose to as low as reasonably achievable. COMPARISON: Lumbar Myelogram dated 07/21/2024 and lumbar spine series dated 04/06/2004. CLINICAL HISTORY: Post myelo FINDINGS: BONES AND ALIGNMENT: Sigmoid scoliosis of the thoracolumbar spine, with the lumbar curvature convex to the left. No acute fracture or suspicious bone lesion. DEGENERATIVE CHANGES: L1-2: Diffuse posterior endplate ridging causing mild-to-moderate central spinal canal stenosis and moderate left lateral recess stenosis but no  apparent nerve root impingement. L2-3: Broad-based disc bulging and endplate ridging resulting in moderate central spinal canal stenosis and moderate bilateral lateral recess stenosis with no definite nerve root impingement. L3-4: Posterior endplate ridging with moderate central spinal canal stenosis and mild-to-moderate bilateral lateral recess stenosis. No apparent nerve root impingement. L4-5: Diffuse endplate ridging with moderate central spinal canal stenosis and moderate bilateral lateral recess stenosis, but no obvious nerve root impingement. L5-S1: Diffuse endplate ridging with mild central spinal canal stenosis. SOFT TISSUES: The abdominal aorta demonstrates mild-to-moderate calcific atheromatous disease. IMPRESSION: 1. Sigmoid scoliosis of the thoracolumbar spine, with the lumbar curvature convex to the left. 2. Mild-to-moderate central spinal canal stenosis at L1-2 due to diffuse posterior endplate ridging, with moderate left lateral recess stenosis. No apparent nerve root impingement. 3. Moderate central spinal canal stenosis at L2-3 due to broad-based disc bulging and endplate ridging, with moderate bilateral lateral recess stenosis. No definite nerve root impingement. 4. Moderate central spinal canal stenosis at L3-4 due to posterior endplate ridging, with mild-to-moderate  bilateral lateral recess stenosis. No apparent nerve root impingement. 5. Moderate central spinal canal stenosis at L4-5 due to diffuse endplate ridging, with moderate bilateral lateral recess stenosis. No obvious nerve root impingement. Electronically signed by: Evalene Coho MD 07/26/2024 07:03 AM EDT RP Workstation: [id number]   DG MYELOGRAPHY LUMBAR INJ LUMBOSACRAL Result Date: 07/21/2024 CLINICAL DATA:  83 year old male with history of low back pain. EXAM: LUMBAR MYELOGRAM FLUOROSCOPY TIME:  Nineteen mGy reference air kerma PROCEDURE: After thorough discussion of risks and benefits of the procedure including bleeding, infection, injury to nerves, blood vessels, adjacent structures as well as headache and CSF leak, written and oral informed consent was obtained. Consent was obtained by Dr. Ester Sides. Time out form was completed. Patient was positioned prone on the fluoroscopy table. Local anesthesia was provided with 1% lidocaine  without epinephrine  after prepped and draped in the usual sterile fashion. Puncture was performed at L4-L5 using a 3 1/2 inch 22-gauge spinal needle via left paramedian approach. Using a single pass through the dura, the needle was placed within the thecal sac, with return of clear CSF. 15 mL of Isovue  M-200 was injected into the thecal sac, with normal opacification of the nerve roots and cauda equina consistent with free flow within the subarachnoid space. I personally performed the lumbar puncture and administered the intrathecal contrast. I also personally supervised acquisition of the myelogram images. COMPARISON:  None Available. FINDINGS: Technically successful lumbar puncture and injection of intrathecal contrast. Radiographs demonstrate appropriate opacification of the lumbar intrathecal sac. No abnormal translation on flexion or extension views. IMPRESSION: Technically successful lumbar puncture at L4-L5 with a left paramedian approach and injection of intrathecal  contrast for myelogram. Please refer the dedicated CT lumbar spine from the same day for level by level myelographic findings. Ester Sides, MD [facility] Radiology Electronically Signed   By: Ester Sides M.D.   On: 07/21/2024 13:52    Labs:  CBC: Recent Labs    09/10/23 1726 10/07/23 1424 08/10/24 0537 08/10/24 0545 08/11/24 0313  WBC 11.0* 7.4 7.6  --  6.7  HGB 10.2* 11.3* 8.0* 8.5* 7.0*  HCT 31.6* 36.5* 24.4* 25.0* 20.5*  PLT 171 161 134*  --  125*    COAGS: Recent Labs    08/10/24 0537  INR 1.2    BMP: Recent Labs    09/10/23 1726 10/07/23 1424 08/10/24 0537 08/10/24 0545 08/11/24 0313  NA 137 140 136 141 139  K 4.6 5.1 5.1 5.3* 4.5  CL 105 109 110 112* 106  CO2 23 23 11*  --  18*  GLUCOSE 102* 97 112* 108* 130*  BUN 36* 37* 153* >130* 146*  CALCIUM  9.1 9.5 8.6*  --  8.3*  CREATININE 2.64* 2.15* 11.59* 12.40* 10.90*  GFRNONAA 23* 30* 4*  --  4*    LIVER FUNCTION TESTS: Recent Labs    08/10/24 0537 08/11/24 0313  BILITOT 0.6  --   AST 33  --   ALT 32  --   ALKPHOS 50  --   PROT 6.2*  --   ALBUMIN  3.7 3.1*    TUMOR MARKERS: No results for input(s): AFPTM, CEA, CA199, CHROMGRNA in the last 8760 hours.  Assessment and Plan: Tunneled dialysis catheter placement due to chronic kidney disease stage 4- -received request for Interventional Radiology to place [facility] -tentatively scheduled for today 10/9 -has been NPO, no Eliquis  given this AM, 2g Ancef  ordered  Risks and benefits discussed with the patient's daughter, Connor Wilkerson, including, but not limited to bleeding, infection, vascular injury, pneumothorax which may require chest tube placement, air embolism or even death  All of the patient's daughter's questions were answered and she is agreeable to proceed. Consent signed and in chart.   Thank you for this interesting consult.  I greatly enjoyed meeting Connor Wilkerson and look  forward to participating in their care.  A copy of this report was sent to the requesting provider on this date.  Electronically Signed: Shakeerah Gradel B Juelle Dickmann, NP 08/11/2024, 11:53 AM   I spent a total of 40 Minutes  in face to face in clinical consultation, greater than 50% of which was counseling/coordinating care for tunneled dialysis catheter placement due to chronic kidney disease stage 4.

## 2024-08-11 NOTE — Progress Notes (Signed)
 [facility] Progress Note   Subjective:   Seen in room - he's alone.  C/o being cold, no appetite but no new complaints.  I/Os yest 2L / 0.35L.    Objective Vitals:   08/10/24 1501 08/10/24 2342 08/11/24 0452 08/11/24 0500  BP: 122/87 (!) 148/54 (!) 166/79   Pulse: 81 71 73   Resp: 17  18   Temp: 97.7 F (36.5 C) 97.7 F (36.5 C) 98.1 F (36.7 C)   TempSrc:  Oral Oral   SpO2: 100% 100% 100%   Weight:    66 kg   Physical Exam Gen: nontoxic but chronically ill appearing Eyes:  EOMI ENT: poor dentition, M MM Neck: supple, no JVD CV: RRR, no rub appreciated Abd:  soft, mildly TTP through, no rebound or guarding, NABS Back: clear lungs GU: no foley Extr:  no edema Neuro:  AOx2 and self cone 2024, difficulty with details Skin:  forehead bandaid from recent skin ca removal, L forearm bandaid skin tear, no rashes  Additional Objective Labs: Basic Metabolic Panel: Recent Labs  Lab 08/10/24 0537 08/10/24 0545 08/11/24 0313  NA 136 141 139  K 5.1 5.3* 4.5  CL 110 112* 106  CO2 11*  --  18*  GLUCOSE 112* 108* 130*  BUN 153* >130* 146*  CREATININE 11.59* 12.40* 10.90*  CALCIUM  8.6*  --  8.3*  PHOS  --   --  7.7*   Liver Function Tests: Recent Labs  Lab 08/10/24 0537 08/11/24 0313  AST 33  --   ALT 32  --   ALKPHOS 50  --   BILITOT 0.6  --   PROT 6.2*  --   ALBUMIN  3.7 3.1*   No results for input(s): LIPASE, AMYLASE in the last 168 hours. CBC: Recent Labs  Lab 08/10/24 0537 08/10/24 0545 08/11/24 0313  WBC 7.6  --  6.7  NEUTROABS 5.5  --   --   HGB 8.0* 8.5* 7.0*  HCT 24.4* 25.0* 20.5*  MCV 100.4*  --  95.8  PLT 134*  --  125*   Blood Culture    Component Value Date/Time   SDES  06/17/2023 1016    TOE TISSUE AMPUTATION Performed at [facility], [address] [place], [place] [address]    SPECREQUEST  06/17/2023 1016    NONE Performed at [facility], [address] [place],  [place] [address]    CULT  06/17/2023 1016    No growth aerobically or anaerobically. Performed at [facility], [address] [place], [place] [address]    REPTSTATUS 06/22/2023 FINAL 06/17/2023 1016    Cardiac Enzymes: Recent Labs  Lab 08/10/24 0742  CKTOTAL 1,210*   CBG: Recent Labs  Lab 08/10/24 1338  GLUCAP 112*   Iron Studies:  Recent Labs    08/11/24 0313  IRON 137  TIBC 199*  FERRITIN 162   @lablastinr3 @ Studies/Results: CT ABDOMEN PELVIS WO CONTRAST Result Date: 08/10/2024 EXAM: CT ABDOMEN AND PELVIS WITHOUT CONTRAST 08/10/2024 10:44:36 AM TECHNIQUE: CT of the abdomen and pelvis was performed without the administration of intravenous contrast. Multiplanar reformatted images are provided for review. Automated exposure control, iterative reconstruction, and/or weight-based adjustment of the mA/kV was utilized to reduce the radiation dose to as low as reasonably achievable. COMPARISON: CT dated 03/23/23. CLINICAL HISTORY: Abdominal pain. FINDINGS: LOWER CHEST: Mild dependent atelectasis in the lower lobes. Aicd leads noted. LIVER: The liver is unremarkable. GALLBLADDER AND BILE DUCTS: Gallbladder is unremarkable.  No biliary ductal dilatation. SPLEEN: No acute abnormality. PANCREAS: No acute abnormality. ADRENAL GLANDS: No acute abnormality. KIDNEYS, URETERS AND BLADDER: The small exophytic region of concern from the left kidney upper pole shown on prior exams is obscured by motion artifact on today's exams but appears roughly similar to previous. This lesion remains indeterminate. No stones in the kidneys or ureters. No hydronephrosis. No perinephric or periureteral stranding. Urinary bladder is unremarkable. GI AND BOWEL: Scattered sigmoid colon diverticula. Stomach demonstrates no acute abnormality. There is no bowel obstruction. PERITONEUM AND RETROPERITONEUM: No ascites. No free air. VASCULATURE: Systemic atherosclerosis is present, including the aorta and iliac arteries.  Coronary and aortic atherosclerosis. LYMPH NODES: No lymphadenopathy. REPRODUCTIVE ORGANS: No acute abnormality. BONES AND SOFT TISSUES: Dextroconvex lumbar scoliosis with rotary component. Thoracic and lumbar spondylosis and degenerative disc disease with resulting moderate right foraminal impingement at L2-L3, L3-L4, and L4-L5, and moderate left foraminal impingement at L2-L3. Mild grade 1 degenerative retrolisthesis at all levels between L1 and S1. Acetabular labral chondrocalcinosis bilaterally. Small left groin hernia contains adipose tissue. No acute osseous abnormality. No focal soft tissue abnormality. IMPRESSION: 1. Small exophytic lesion at the left renal upper pole remains indeterminate due to motion artifact. 2. No acute findings in the abdomen or pelvis. 3. Systemic atherosclerosis, including aorta and iliac arteries. 4. Coronary atherosclerosis. 5. Thoracic and lumbar spondylosis and degenerative disc disease with moderate right foraminal impingement at L3-L4 and L4-L5, and moderate left foraminal impingement at L2-L3. 6. Mild grade 1 degenerative retrolisthesis from L1 to S1. 7. Acetabular labral chondrocalcinosis bilaterally. 8. Dextroconvex lumbar scoliosis with rotary component. Electronically signed by: Ryan Salvage MD 08/10/2024 11:15 AM EDT RP Workstation: [id number]   DG Lumbar Spine Complete Result Date: 08/10/2024 EXAM: 5 VIEW(S) XRAY OF THE LUMBAR SPINE 08/10/2024 06:00:58 AM COMPARISON: CT lumbar myelogram dated 07/21/2024 and earlier studies. CLINICAL HISTORY: 83 year old male with lower back pain and weakness. FINDINGS: LUMBAR SPINE: BONES: Stable vertebral height and alignment including s shaped lumbar scoliosis and straightening of lordosis. No acute fracture. No aggressive appearing osseous lesion. DISCS AND DEGENERATIVE CHANGES: Chronic severe lumbar disc and endplate degeneration with diffuse vacuum disc and endplate spurring. SOFT TISSUES: Calcified abdominal aortic  atherosclerosis. No acute abnormality. IMPRESSION: 1. No acute abnormality of the lumbar spine. 2. Chronic severe lumbar disc and endplate degeneration with underlying S-shaped lumbar scoliosis stable from CT myelogram last month. Electronically signed by: Helayne Hurst MD 08/10/2024 06:08 AM EDT RP Workstation: [id number]   DG Chest Port 1 View Result Date: 08/10/2024 EXAM: 1 VIEW(S) XRAY OF THE CHEST 08/10/2024 05:59:33 AM COMPARISON: CT chest, abdomen, and pelvis dated 09/28/2022 and earlier. CLINICAL HISTORY: 83 year old male with dyspnea on exertion. FINDINGS: LUNGS AND PLEURA: No focal pulmonary opacity. No pulmonary edema. No pleural effusion. No pneumothorax. HEART AND MEDIASTINUM: Stable left chest Automatic Implantable Cardioverter-Defibrillator (AICD). Chronic Coronary Artery Bypass Grafting (CABG) and sternotomy. Mediastinal contours are stable and within normal limits. BONES AND SOFT TISSUES: Right total shoulder arthroplasty is new from 2023. IMPRESSION: 1. No acute cardiopulmonary process. Electronically signed by: Helayne Hurst MD 08/10/2024 06:06 AM EDT RP Workstation: [id number]   Medications:  sodium bicarbonate 150 mEq in dextrose  5 % 1,150 mL infusion 100 mL/hr at 08/10/24 2310    acetaminophen   1,000 mg Oral Q6H WA   apixaban   2.5 mg Oral BID   feeding supplement  237 mL Oral BID BM   rosuvastatin   10 mg Oral Daily   Assessment/Plan Connor Wilkerson is  an 83 y.o. male with HFrEF (last TTE 2019 EF 35%), CAD, ICD, HTN, HL, A fib presenting with weakness and diarrhea x 2 weeks who is found to have severe AKI on CKD for which nephrology is consulted.    **Severe AKI on CKD 4:  3 wk history of symptoms - unclear if this is prerenal state leading to AKI on CKD vs progressive CKD presenting with uremic symptoms.  UA dip +bld, 1+ protein but no RBC on microscopy (CK ~1000  hold statin), FeNa 5%, U/P pending, Myeloma w/u pending.  CT a/p no obstruction.  With mild thrombocytopenia smear  checked - no schistocytes.  Holding ARB.   Has not had substantial improvement with hydration and poor appetite could be uremic symptoms - recommend dialysis at this time.  Discussed with patient and wife - proceed with IR temp HD catheter placement and HD #1 today.  Avoid extremes of BP and nephrotoxins.  Strict I/Os, daily weights.  Qshift PVR today.    **HFrEF:  appearing euvolemic after hydration overnight.  Hold entresto  in setting of severe AKI.     **HTN: holding entresto .  BP slightly higher but watch for now do not want hypotension   **NAGMA: in setting of severe AKI, improving with bicarb gtt, plan to cont until HD   **Hyperkalemia, resolved s/p lokelma  **HL:  CK mildly elevated and with AKI hold statin   **thrombocytopenia:  mild, smear normal plt morphology - stable in 120s, CTM   **Anemia:  macrocytic, iron, B12 folate all ok. Pending SPEP, UPEP, SFLC.  Hb trending down check hemoccult, transfuse < 7.    **Falls/debility:  r/o uremia, other w/u per primary   Will follow reach out with concerns.   Discussed in detail w wife on phone this AM.   Manuelita Barters MD 08/11/2024, 8:18 AM  [facility] Pager: [phone]

## 2024-08-11 NOTE — H&P (Signed)
 Date: 08/10/2024         Patient Name:  Connor Wilkerson MRN: [id number]  DOB: [birth year] Age / Sex: 83 y.o., male   PCP: Keren Vicenta BRAVO, MD         Medical Service: Internal Medicine Teaching Service         Attending Physician: Dr. Trudy Mliss Dragon, MD    First Contact: Dr. Bernadine Pager: [phone]  Second Contact: Dr. Heddy Pager: [phone]                 Chief Concern: Back pain, difficulty transferring from chair to bed  History of Present Illness: 83 year old came to ED by ambulance for worsening back pain and immobility. He is accompanied by his wife, Holley, and a friend.  He has been experiencing severe back pain for seven to eight months, primarily located at the belt line on the left side. The pain is described as burning and worsens with movement, particularly when trying to walk. He had a fall in the front yard about three weeks ago.   He is experiencing decreased urine output, with the last attempt to urinate at 4:30 AM resulting in only a small amount. He is unsure how long this has been occurring. He has a history of seeing a nephrologist, Dr. Dalene, but is unaware of any specific kidney disease diagnosis. His father had kidney disease, but details are unknown.  He reports a lack of appetite for the past couple of weeks and has not been eating. No diarrhea, vomiting, or abdominal pain. He has experienced intermittent chills but no consistent fever.  He has a history of two heart attacks but denies any history of stroke, liver disease, lung disease, diabetes, or high blood pressure. He takes Tylenol  for pain and has hydrocodone  prescribed. He denies the use of NSAIDs or recent antibiotics.  He has a history of smoking and alcohol  use but has been sober and smoke-free for over forty years. He enjoys playing with his Advertising account planner, Cookie, but has been unable to do so for the past week.  In the ED workup was notable for creatinine to ~11 from baseline  around 2, hemoglobin of 8 down from 11.3 ten months ago. Bedside ultrasound didn't show bladder distention or hydronephrosis. PVR was measured ~60. IV fluids were started in case of pre-renal AKI and nephrology was consulted. Since arrival ~4 hours ago he has urinated 300 mL.  ROS per above.   Allergies: Allergies  Allergen Reactions   Tramadol Other (See Comments)    Unknown Reaction -  Unsure of reaction     Past Medical History: Patient Active Problem List   Diagnosis Date Noted   Acute renal failure 08/10/2024   Malaise 08/10/2024   Back pain 08/10/2024   Macrocytic anemia 08/10/2024   Thrombocytopenia 08/10/2024   PAD (peripheral artery disease) 08/10/2024   Encephalopathy 08/10/2024   Cervical radiculopathy 06/02/2024   Lesion of ulnar nerve 06/02/2024   Osteoarthritis 06/02/2024   Shoulder pain 06/02/2024   Skin sensation disturbance 06/02/2024   S/P shoulder replacement, right 10/16/2023   Carotid artery disease 08/06/2022   PVC (premature ventricular contraction) 04/04/2022   Orthostatic hypotension 08/27/2020   Atrial fibrillation (HCC) 02/29/2020   Hypercoagulable state 02/29/2020   CAD (coronary artery disease) 12/07/2018   Hyperlipidemia 12/07/2018   CKD (chronic kidney disease) stage 3, GFR 30-59 ml/min (HCC) 12/07/2018   Ischemic cardiomyopathy    Essential hypertension 09/22/2016   Restless leg  syndrome 07/14/2016   AKI (acute kidney injury) 04/11/2016   Hypotension 04/11/2016   Abnormality of gait 02/07/2015   Near syncope 09/21/2014   Repeated falls 12/15/2013   OSA (obstructive sleep apnea) 12/15/2013   Chronic systolic CHF (congestive heart failure) (HCC) 01/01/2010   CARDIOMYOPATHY, ISCHEMIC 12/31/2009   DYSPNEA 12/31/2009   Implantable cardioverter-defibrillator (ICD) in situ 12/31/2009   Past Medical History:  Diagnosis Date   Abnormality of gait 02/07/2015   Arthritis    shoulders, pinched nerve around waist and back   Asthma    CAD  (coronary artery disease)    Hx of Inf MI treated with CABG in 1999 // Myoview 11/16: mild inf-lat ischemia, EF 34, high risk // LHC 3/19: 3/3 bypass grafts patent   Carotid artery disease    Followed by [facility] (Dr. Serene) // US  in 2/19: bilat ICA 40-59   Chronic systolic CHF (congestive heart failure) (HCC) 01/01/2010   Echo 12/30/2017: Mild LVH, EF 30-35, diffuse HK, grade 1 diastolic dysfunction, mildly reduced RVSF // Echo 04/12/2016:  EF 35-40, PASP 39, mild to moderate TR, mild LAE // Echo (12/14):  Base/mid inferior and inferolateral HK, EF 45%, mild LAE, mildly reduced RVSF   CKD (chronic kidney disease) Stage 3 B    followed by dr peoples q year lov 04-23-2022 on chart   Depression    HLP (hyperkeratosis lenticularis perstans)    HTN (hypertension)    ICD (implantable cardiac defibrillator)  BSx    [employer]   Ischemic cardiomyopathy    Myocardial infarction (HCC) 1999   Near syncope 09/21/2014   Orthostatic hypotension    PAF (paroxysmal atrial fibrillation) (HCC)    Peripheral vascular disease    Wears glasses    Wears partial dentures    upper    Medications: No current facility-administered medications on file prior to encounter.   Current Outpatient Medications on File Prior to Encounter  Medication Sig Dispense Refill   acetaminophen  (TYLENOL ) 500 MG tablet Take 500-1,000 mg by mouth every 6 (six) hours as needed for moderate pain (pain score 4-6).     amLODipine  (NORVASC ) 2.5 MG tablet TAKE 2 TABLETS BY MOUTH DAILY (Patient taking differently: Take 2.5 mg by mouth daily.) 180 tablet 3   apixaban  (ELIQUIS ) 2.5 MG TABS tablet Take 1 tablet (2.5 mg total) by mouth 2 (two) times daily. 180 tablet 1   buPROPion  (WELLBUTRIN  SR) 150 MG 12 hr tablet Take 300 mg by mouth daily.     escitalopram  (LEXAPRO ) 10 MG tablet Take 10 mg by mouth daily.     famotidine  (PEPCID ) 20 MG tablet Take 20 mg by mouth 2 (two) times daily.     gabapentin  (NEURONTIN ) 300 MG capsule Take 300  mg by mouth at bedtime.     lidocaine  (LIDODERM ) 5 % Place 1 patch onto the skin daily as needed (Pain).     mupirocin  ointment (BACTROBAN ) 2 % Apply 1 Application topically 2 (two) times daily. Head/Wrist     nitroGLYCERIN  (NITROSTAT ) 0.4 MG SL tablet Place 1 tablet (0.4 mg total) under the tongue every 5 (five) minutes as needed for chest pain. 25 tablet 11   rosuvastatin  (CRESTOR ) 40 MG tablet TAKE 1 TABLET BY MOUTH EVERY DAY 90 tablet 2   sacubitril -valsartan  (ENTRESTO ) 24-26 MG TAKE 1/2 TABLET TWICE A DAY BY MOUTH 180 tablet 2   traZODone  (DESYREL ) 50 MG tablet Take 50 mg by mouth at bedtime as needed for sleep.  Surgical History: Past Surgical History:  Procedure Laterality Date   AMPUTATION TOE Left 06/17/2023   Procedure: LEFT 2ND TOE AMPUTATION THRU PROXIMAL PHALANX;  Surgeon: Barton Drape, MD;  Location: [facility] ;  Service: Orthopedics;  Laterality: Left;   CARDIAC CATHETERIZATION N/A 12/11/2015   Procedure: Left Heart Cath and Coronary Angiography;  Surgeon: Rober Chroman, MD;  Location: [facility] INVASIVE CV LAB;  Service: Cardiovascular;  Laterality: N/A;   CARDIAC DEFIBRILLATOR PLACEMENT     [employer]   CARDIAC DEFIBRILLATOR PLACEMENT  04/08/2016   NOT MRI SAFE (DOCUMENT SCANNED IN SYSTEM   COLONOSCOPY  10/06/2007   Mild sigmoid diverticulosis. Small internal hemorrhoids.    CORONARY ARTERY BYPASS GRAFT     ELBOW SURGERY Left    plastic bone replacement   HERNIA REPAIR     x2   LEFT HEART CATH AND CORS/GRAFTS ANGIOGRAPHY N/A 01/07/2018   Procedure: LEFT HEART CATH AND CORS/GRAFTS ANGIOGRAPHY;  Surgeon: Verlin Lonni BIRCH, MD;  Location: [facility] INVASIVE CV LAB;  Service: Cardiovascular;  Laterality: N/A;   PERMANENT PACEMAKER GENERATOR CHANGE N/A 11/25/2013   Procedure: PERMANENT PACEMAKER GENERATOR CHANGE;  Surgeon: Elspeth JAYSON Sage, MD;  Location: [facility] CATH LAB;  Service: Cardiovascular;  Laterality: N/A;    REVERSE SHOULDER ARTHROPLASTY Right  10/16/2023   Procedure: REVERSE SHOULDER ARTHROPLASTY;  Surgeon: Kay Kemps, MD;  Location: [facility] ORS;  Service: Orthopedics;  Laterality: Right;  100   ROTATOR CUFF REPAIR Right 2006   SINUS EXPLORATION  1987   ULNAR NERVE TRANSPOSITION Left 07/19/2019   Procedure: Ulnar nerve release at the elbow with anterior transposition and flexor pronator lengthening, anterior interosseous nerve transfer to the deep motor branch of the ulnar nerve at the forearm, flexor digitorum profundus tendon transfer of the ring and small to middle fingers, ulnar nerve release at the wrist, Guyons canal;  Surgeon: Camella Fallow, MD;  Location: [facility] OR;  Service: Orthoped   ULTRASOUND GUIDANCE FOR VASCULAR ACCESS  01/07/2018   Procedure: Ultrasound Guidance For Vascular Access;  Surgeon: Verlin Lonni BIRCH, MD;  Location: [facility] INVASIVE CV LAB;  Service: Cardiovascular;;    Family History:  Family History  Problem Relation Age of Onset   Heart disease Mother    Heart attack Mother    Heart disease Father    Heart attack Father    Heart disease Brother    Heart attack Brother     Social History:  Social History   Socioeconomic History   Marital status: Married    Spouse name: Not on file   Number of children: 1   Years of education: HS +   Highest education level: Not on file  Occupational History   Occupation: retired  Tobacco Use   Smoking status: Former    Types: Pipe    Quit date: 09/19/1999    Years since quitting: 24.9    Passive exposure: Never   Smokeless tobacco: Never   Tobacco comments:    quit in 1998  Vaping Use   Vaping status: Never Used  Substance and Sexual Activity   Alcohol  use: No    Alcohol /week: 0.0 standard drinks of alcohol     Comment: recovering alcoholic, quit 67yrs ago (2000)   Drug use: No   Sexual activity: Not on file  Other Topics Concern   Not on file  Social History Narrative   Lives at home w/ his wife.   Patient is right handed.   Patient drinks about 3  cups of soda daily.  Social Drivers of Corporate investment banker Strain: Not on file  Food Insecurity: No Food Insecurity (10/16/2023)   Hunger Vital Sign    Worried About Running Out of Food in the Last Year: Never true    Ran Out of Food in the Last Year: Never true  Transportation Needs: No Transportation Needs (10/16/2023)   PRAPARE - Administrator, Civil Service (Medical): No    Lack of Transportation (Non-Medical): No  Physical Activity: Not on file  Stress: Not on file  Social Connections: Not on file  Intimate Partner Violence: Not At Risk (10/16/2023)   Humiliation, Afraid, Rape, and Kick questionnaire    Fear of Current or Ex-Partner: No    Emotionally Abused: No    Physically Abused: No    Sexually Abused: No      Physical Exam: Blood pressure (!) 133/94, pulse 68, temperature 98.6 F (37 C), temperature source Oral, resp. rate (!) 23, weight 69.9 kg, SpO2 100%.  Somewhat frail-appearing Moist pink oral mucosa Heart rate normal, rhythm regular, no audible murmurs, strong radial pulses, warm extremities, no LE edema Breathing comfortably on room air, slight bibasilar crackles Decreased skin turgor Abdomen soft and non-tender, no suprapubic or CVA tenderness Alert and oriented to person, answers questions appropriately, follows simple instructions, some diminished sensation left leg compared to right, no pronator drift, bilateral hand tremor present, speech normal, no facial asymmetry, normal eye movements and pupils  EKG:  Sinus rhythm 70 bpm with TWI/biphasic TW in inferolateral leads, similar to prior  Labs: Per above. BUN 153 UA with small glucose and protein, moderate hemoglobin but no RBC or WBC under microscope, mucus and granular casts present, amorphous crystals present Spot urine sodium 45 Prior studies w/ creatinine level reflecting CKD 3a-b  Images and other studies: CXR without pulmonary infiltrates or edema/effusion Lumbar spine  x-ray without acute abnormality  Assessment & Plan:  BLAS RICHES is a 83 y.o. with ischemic cardiomyopathy, chronic back pain on opioids, paroxysmal a-fib on DOAC who presents after weeks of malaise and found to have acute renal failure with suspected oliguria.  Principal Problem:   Acute renal failure With suspected oliguria, although not oliguric since arrival to ED based on output of ~300 mL over 4 hours. History and exam suggests pre-renal disease due to poor PO intake. No urinary retention or hydronephrosis rules out obstructive uropathy. UA largely bland, makes intrinsic disease less likely. No NSAID use outside of hospital. Nephrology to see in consult. Continue bicarbonate infusion for NAGMA. Hold BP medicine/RAAS blockers. Renally dose medications. Monitor I/O. CT a/p wo contrast per nephrology. Check CK. Trend creatinine.   Active Problems:   Ischemic cardiomyopathy Chronic, stable. Seems dry by history and exam. IV fluid administration of presumed pre-renal AKI per above. Monitor volume status. Holding outpatient Entresto .    Atrial fibrillation (HCC) Chronic, paroxysmal. Low a-fib burden by ICD interrogation. Rhythm is sinus now. Resume outpatient Eliquis . -Eliquis  2.5 mg BID    Hypercoagulable state Secondary due to a-fib. Eliquis  per above.    Malaise   Encephalopathy With fatigue, decreased PO intake, decreased mobility, altered mental status of late. Suspect this is from uremia. Monitor clinically for improvement with treatment for renal insufficiency.    Back pain Chronic, stable. Due to degenerative spinal changes without myelopathy. -acetaminophen  1000 mg q6 h while awake    Macrocytic anemia   Thrombocytopenia No overt signs/symptoms of bleeding. No history of cirrhosis. No indication for transfusion at present. -iron,  TIBC, ferritin -LDH -B12 -immature platelet fraction -peripheral smear review -SPEP/UPEP -kappa/lambda light chains    PAD (peripheral  artery disease) Chronic, stable. With bilateral lower extremity and bilateral carotid disease. No on antiplatelets outside of hospital. Resume outpatient rosuvastatin  at reduced dose. -rosuvastatin  10 mg daily  Level of care: med-surg Diet: regular IVF: sodium bicarbonate infusion VTE: apixaban  (ELIQUIS ) tablet 2.5 mg Start: 08/10/24 1000 Code: full, but wouldn't want life-sustaining measures if he couldn't be liberated from ventilator Surrogate: wife, at bedside  Signed: Ozell Kung MD 08/10/2024, 9:02 AM Pager: [phone]

## 2024-08-11 NOTE — Progress Notes (Signed)
 Initial Nutrition Assessment  DOCUMENTATION CODES:   Severe malnutrition in context of chronic illness  INTERVENTION:  When diet advanced, recommend liberalized regular diet to provide increased options for pt to meet calorie and protein needs related to dialysis treatments Fluid restriction if deemed necessary by Nephrology Can switch ONS to Nepro to limit phosphorus and potassium intake  Encouraged adequate PO intake to meet increased needs related to HD treatments   Provided education for Eating on Dialysis Nutrition Therapy; pt not appropriate for education at time, but left handout with pt and will follow up to provide in person education when more appropriate   NUTRITION DIAGNOSIS:   Severe Malnutrition related to chronic illness as evidenced by severe fat depletion, severe muscle depletion.  GOAL:   Patient will meet greater than or equal to 90% of their needs  MONITOR:   PO intake, Supplement acceptance, Labs  REASON FOR ASSESSMENT:   Malnutrition Screening Tool    ASSESSMENT:   Pt with hx of myocardial infarction x 2, CAD, CHF, PVD, and CKD 4. Hx of L 2nd toe amputation (06/2023) and R shoulder arthroplasty (10/2023). Admitted with worsening back pain and immobility; diagnosed acute renal failure w/ severe AKI on CKD 4.  Nephrology following and recommending HD for AKI, will have temporary HD catheter placed and HD #1 today. Pt received hydration but continued poor PO intake with no improvements.   Pt currently NPO for catheter placement. Pt resting in bed at time of assessment. Pt not well oriented and unable to provide diet or wt hx at this time. Pt states he is cold and requested another blanket. Pt unaware he has not eaten today, discussed that he will be able to eat after HD catheter is placed and he goes to his first treatment. Discussed need for adequate intake as dialysis increases calorie and protein needs. Gave pt Eating on Dialysis Nutrition Therapy handout  from the [employer] but pt was not ready for education at this time, will follow up and discuss when more appropriate.   Pt's physical exam shows severe muscle and severe fat depletions, indicative of severe malnutrition. Very limited wt hx available in chart, but suspect malnutrition has been ongoing for some time. Per chart review, pt diagnosed with squamous cell carcinoma of L arm within the last year and had undergone a surgery and treatment but details about treatment unavailable. Suspect malnutrition in the context of chronic illness related to carcinoma and progression to CKD 4.  Recommend pt have a liberalized diet to provide as many options to promote proper PO intake. Recommend continuing ONS while admitted, but can be switched to Nepro to limit phosphorus and potassium intake while treating AKI. Will monitor tolerance of HD treatments and PO intake once diet is advanced.  Medications: Ensure BID Ancef    Labs: BUN 146<--153 Creatinine 10.90<--12.40 Potassium 4.5<--5.3 Phosphorus 7.7  UOP: 350 mL x 12 hr  NUTRITION - FOCUSED PHYSICAL EXAM:  Flowsheet Row Most Recent Value  Orbital Region Severe depletion  Upper Arm Region Severe depletion  Thoracic and Lumbar Region Severe depletion  Buccal Region Severe depletion  Temple Region Severe depletion  Clavicle Bone Region Severe depletion  Clavicle and Acromion Bone Region Severe depletion  Scapular Bone Region Severe depletion  Dorsal Hand Severe depletion  Patellar Region Severe depletion  Anterior Thigh Region Severe depletion  Posterior Calf Region Severe depletion  Edema (RD Assessment) None  Hair Reviewed  Eyes Reviewed  Mouth Reviewed  Skin Reviewed  Nails Reviewed    Diet Order:   Diet Order             Diet NPO time specified Except for: Sips with Meds  Diet effective now                   EDUCATION NEEDS:   Not appropriate for education at this time  Skin:  Skin  Assessment: Reviewed RN Assessment  Last BM:  10/8  Height:   Ht Readings from Last 1 Encounters:  01/20/24 5' 10 (1.778 m)    Weight:   Wt Readings from Last 1 Encounters:  08/11/24 66 kg    Ideal Body Weight:  75.5 kg  BMI:  Body mass index is 20.88 kg/m.  Estimated Nutritional Needs:   Kcal:  1800-2000  Protein:  70-90g  Fluid:  1 L + UOP    Josette Glance, MS, RDN, LDN Clinical Dietitian I Please reach out via secure chat

## 2024-08-11 NOTE — Evaluation (Signed)
 Physical Therapy Evaluation Patient Details Name: Connor Wilkerson MRN: [id number] DOB: 1941/08/16 Today's Date: 08/11/2024  History of Present Illness  Connor Wilkerson is a 83 y.o. male who presented to [facility] ED 08/10/24 with c/o weakness, difficulty walking, poor PO intake, and diarrhea x 2 weeks. Found to have acute renal failure with suspected oliguria. Pending IR temp HD catheter placement and HD. PMHx: HFrEF, CAD, ICD, CKD 3b, HTN, HLP, A fib, MI, PVD, chronic back pain, and depression.   Clinical Impression  Pt admitted with above diagnosis. Examination limited by impaired cognition, pt being a poor historian, and lack of family present. Pt reports he ambulates using SPC. Pt currently with functional limitations due to the deficits listed below (see PT Problem List). He required minA for bed mobility and sit<>stand using RW. Pt is currently limited by decreased cognition, pain, symptomatic orthostatic hypotension, impaired balance, and decreased activity tolerance. Pt will benefit from acute skilled PT to increase his independence and safety with mobility to allow discharge. Recommend continued inpatient follow up therapy, <3 hours/day.   08/11/24 1100  Vital Signs  Patient Position (if appropriate) Orthostatic Vitals  Orthostatic Lying   BP- Lying 139/69  Pulse- Lying 70  Orthostatic Sitting  BP- Sitting 108/67  Pulse- Sitting 81  Orthostatic Standing at 0 minutes  BP- Standing at 0 minutes  (Pt unable to maintain static stance d/t worsening dizziness, fatigue, and generalized LE weakness.)         If plan is discharge home, recommend the following: A lot of help with walking and/or transfers;A lot of help with bathing/dressing/bathroom;Assistance with cooking/housework;Assist for transportation;Help with stairs or ramp for entrance   Can travel by private vehicle   No    Equipment Recommendations Wheelchair (measurements PT);Wheelchair cushion (measurements PT)   Recommendations for Other Services       Functional Status Assessment Patient has had a recent decline in their functional status and demonstrates the ability to make significant improvements in function in a reasonable and predictable amount of time.     Precautions / Restrictions Precautions Precautions: Fall Recall of Precautions/Restrictions: Impaired Restrictions Weight Bearing Restrictions Per Provider Order: No      Mobility  Bed Mobility Overal bed mobility: Needs Assistance Bed Mobility: Rolling, Sidelying to Sit, Sit to Sidelying Rolling: Min assist, Used rails Sidelying to sit: Min assist, HOB elevated, Used rails     Sit to sidelying: Min assist, HOB elevated, Used rails General bed mobility comments: Pt sat up on L side of bed with increased time. He reports typically getting out of bed on the left. Pt was greeted sidelying on R. Assist to roll pt, bring BLE off EOB, elevate trunk and scoot hips fwd. Pt laterally scooted to the left along EOB with BUE. Returning to bed pt required assist with bring BLE back into bed. He repositioned himself scooting higher towards HOB with BUE support on rails.    Transfers Overall transfer level: Needs assistance Equipment used: Rolling walker (2 wheels) Transfers: Sit to/from Stand Sit to Stand: Min assist           General transfer comment: Pt stood from lowest bed height. Cued proper hand placement using RW. Powered up with minA. Maintained static stance for ~30sec. Fair eccentric control.    Ambulation/Gait               General Gait Details: Deferred d/t symptomatic orthostatic hypotension, fatigue, and generalized weakness.  Stairs  Wheelchair Mobility     Tilt Bed    Modified Rankin (Stroke Patients Only)       Balance Overall balance assessment: Needs assistance Sitting-balance support: Bilateral upper extremity supported, Feet supported Sitting balance-Leahy Scale: Fair Sitting  balance - Comments: Pt sat EOB with CGA Postural control: Posterior lean Standing balance support: Bilateral upper extremity supported, During functional activity, Reliant on assistive device for balance Standing balance-Leahy Scale: Poor Standing balance comment: Pt dependent on RW and minA to maintain static stance.                             Pertinent Vitals/Pain Pain Assessment Pain Assessment: Faces Faces Pain Scale: Hurts little more Pain Location: Back Pain Descriptors / Indicators: Discomfort, Aching, Sore Pain Intervention(s): Monitored during session, Limited activity within patient's tolerance, Repositioned    Home Living Family/patient expects to be discharged to:: Private residence Living Arrangements: Spouse/significant other Available Help at Discharge: Available 24 hours/day;Family Type of Home: House Home Access: Level entry       Home Layout: One level Home Equipment: Cane - single point;BSC/3in1;Shower seat;Grab bars - toilet;Grab bars - tub/shower;Hand held shower head Additional Comments: Above information is obtained per chart review from prior admission 10/2023. Unsure of accuracy of home set-up    Prior Function Prior Level of Function : Patient poor historian/Family not available             Mobility Comments: Pt reports he ambulates using SPC and denies fall history, unsure of accuracy. ADLs Comments: Pt reports he manages basic ADLs, unsure of accuracy.     Extremity/Trunk Assessment   Upper Extremity Assessment Upper Extremity Assessment: Defer to OT evaluation    Lower Extremity Assessment Lower Extremity Assessment: Generalized weakness    Cervical / Trunk Assessment Cervical / Trunk Assessment: Kyphotic  Communication   Communication Communication: No apparent difficulties    Cognition Arousal: Alert Behavior During Therapy: Flat affect   PT - Cognitive impairments: No family/caregiver present to determine  baseline, Orientation   Orientation impairments: Place, Time, Situation                   PT - Cognition Comments: Pt A,O to self only. Following commands: Impaired Following commands impaired: Only follows one step commands consistently, Follows one step commands with increased time     Cueing Cueing Techniques: Verbal cues, Gestural cues     General Comments General comments (skin integrity, edema, etc.): Orthostatics taken, postive. Unable to obtain reading in standing. Upon sitting BP 147/67 (87), HR 71 & return to supine BP 140/56 (81) and HR 62. Pt c/o dizziness with position changes.    Exercises     Assessment/Plan    PT Assessment Patient needs continued PT services  PT Problem List Decreased strength;Decreased activity tolerance;Decreased balance;Decreased mobility;Decreased knowledge of use of DME;Decreased safety awareness;Cardiopulmonary status limiting activity       PT Treatment Interventions DME instruction;Gait training;Functional mobility training;Therapeutic activities;Therapeutic exercise;Balance training;Cognitive remediation;Patient/family education;Wheelchair mobility training    PT Goals (Current goals can be found in the Care Plan section)  Acute Rehab PT Goals PT Goal Formulation: Patient unable to participate in goal setting Time For Goal Achievement: 08/25/24 Potential to Achieve Goals: Fair    Frequency Min 2X/week     Co-evaluation               AM-PAC PT 6 Clicks Mobility  Outcome Measure Help needed turning from your  back to your side while in a flat bed without using bedrails?: A Little Help needed moving from lying on your back to sitting on the side of a flat bed without using bedrails?: A Little Help needed moving to and from a bed to a chair (including a wheelchair)?: A Lot Help needed standing up from a chair using your arms (e.g., wheelchair or bedside chair)?: A Little Help needed to walk in hospital room?: A  Lot Help needed climbing 3-5 steps with a railing? : A Lot 6 Click Score: 15    End of Session Equipment Utilized During Treatment: Gait belt Activity Tolerance: Treatment limited secondary to medical complications (Comment) (symptomatic orthostatic hypotension) Patient left: in bed;with call bell/phone within reach;with bed alarm set Nurse Communication: Mobility status PT Visit Diagnosis: Muscle weakness (generalized) (M62.81);Difficulty in walking, not elsewhere classified (R26.2);Unsteadiness on feet (R26.81);Dizziness and giddiness (R42)    Time: 8895-8875 PT Time Calculation (min) (ACUTE ONLY): 20 min   Charges:   PT Evaluation $PT Eval Moderate Complexity: 1 Mod   PT General Charges $$ ACUTE PT VISIT: 1 Visit         Randall SAUNDERS, PT, DPT Acute Rehabilitation Services Office: [phone] Secure Chat Preferred  Delon CHRISTELLA Callander 08/11/2024, 12:11 PM

## 2024-08-11 NOTE — Progress Notes (Signed)
 Hemodialysis Call 5N and the bedside nurse said the patient will be going tomorrow for [facility] placed  by IR.

## 2024-08-12 ENCOUNTER — Inpatient Hospital Stay (HOSPITAL_COMMUNITY)

## 2024-08-12 HISTORY — PX: IR TUNNELED CENTRAL VENOUS CATH PLC W IMG: IMG1939

## 2024-08-12 LAB — RENAL FUNCTION PANEL
Albumin: 3 g/dL — ABNORMAL LOW (ref 3.5–5.0)
Albumin: 3.1 g/dL — ABNORMAL LOW (ref 3.5–5.0)
Anion gap: 16 — ABNORMAL HIGH (ref 5–15)
Anion gap: 14 (ref 5–15)
BUN: 142 mg/dL — ABNORMAL HIGH (ref 8–23)
BUN: 72 mg/dL — ABNORMAL HIGH (ref 8–23)
CO2: 19 mmol/L — ABNORMAL LOW (ref 22–32)
CO2: 24 mmol/L (ref 22–32)
Calcium: 8 mg/dL — ABNORMAL LOW (ref 8.9–10.3)
Calcium: 8.3 mg/dL — ABNORMAL LOW (ref 8.9–10.3)
Chloride: 102 mmol/L (ref 98–111)
Chloride: 101 mmol/L (ref 98–111)
Creatinine, Ser: 10.8 mg/dL — ABNORMAL HIGH (ref 0.61–1.24)
Creatinine, Ser: 6.64 mg/dL — ABNORMAL HIGH (ref 0.61–1.24)
GFR, Estimated: 4 mL/min — ABNORMAL LOW (ref >60)
GFR, Estimated: 8 mL/min — ABNORMAL LOW (ref >60)
Glucose, Bld: 99 mg/dL (ref 70–99)
Glucose, Bld: 80 mg/dL (ref 70–99)
Phosphorus: 8 mg/dL — ABNORMAL HIGH (ref 2.5–4.6)
Phosphorus: 4.3 mg/dL (ref 2.5–4.6)
Potassium: 4.8 mmol/L (ref 3.5–5.1)
Potassium: 3.7 mmol/L (ref 3.5–5.1)
Sodium: 137 mmol/L (ref 135–145)
Sodium: 139 mmol/L (ref 135–145)

## 2024-08-12 LAB — CBC
HCT: 21.4 % — ABNORMAL LOW (ref 39.0–52.0)
Hemoglobin: 7.2 g/dL — ABNORMAL LOW (ref 13.0–17.0)
MCH: 32.6 pg (ref 26.0–34.0)
MCHC: 33.6 g/dL (ref 30.0–36.0)
MCV: 96.8 fL (ref 80.0–100.0)
Platelets: 119 K/uL — ABNORMAL LOW (ref 150–400)
RBC: 2.21 MIL/uL — ABNORMAL LOW (ref 4.22–5.81)
RDW: 12.3 % (ref 11.5–15.5)
WBC: 7.5 K/uL (ref 4.0–10.5)
nRBC: 0 % (ref 0.0–0.2)

## 2024-08-12 LAB — KAPPA/LAMBDA LIGHT CHAINS
Kappa free light chain: 56.5 mg/L — ABNORMAL HIGH (ref 3.3–19.4)
Kappa, lambda light chain ratio: 1.27 (ref 0.26–1.65)
Lambda free light chains: 44.6 mg/L — ABNORMAL HIGH (ref 5.7–26.3)

## 2024-08-12 LAB — HEPATITIS B SURFACE ANTIBODY, QUANTITATIVE: Hep B S AB Quant (Post): <3.5 m[IU]/mL — ABNORMAL LOW

## 2024-08-12 MED ORDER — CEFAZOLIN SODIUM-DEXTROSE 2-4 GM/100ML-% IV SOLN
INTRAVENOUS | Status: AC
  Filled 2024-08-12: qty 100

## 2024-08-12 MED ORDER — HEPARIN SODIUM (PORCINE) 1000 UNIT/ML DIALYSIS
1000.0000 [IU] | INTRAMUSCULAR | Status: DC | PRN
  Administered 2024-08-12: 1000 [IU]

## 2024-08-12 MED ORDER — LIDOCAINE-EPINEPHRINE 1 %-1:100000 IJ SOLN
INTRAMUSCULAR | Status: AC
  Filled 2024-08-12: qty 1

## 2024-08-12 MED ORDER — CEFAZOLIN SODIUM-DEXTROSE 2-4 GM/100ML-% IV SOLN
INTRAVENOUS | Status: AC | PRN
  Administered 2024-08-12: 2 g via INTRAVENOUS

## 2024-08-12 MED ORDER — LIDOCAINE-EPINEPHRINE 1 %-1:100000 IJ SOLN
20.0000 mL | Freq: Once | INTRAMUSCULAR | Status: AC
  Administered 2024-08-12: 20 mL via INTRADERMAL

## 2024-08-12 MED ORDER — FENTANYL CITRATE (PF) 100 MCG/2ML IJ SOLN
INTRAMUSCULAR | Status: AC
  Filled 2024-08-12: qty 2

## 2024-08-12 MED ORDER — HYDROMORPHONE HCL 1 MG/ML IJ SOLN
0.5000 mg | Freq: Once | INTRAMUSCULAR | Status: DC
  Filled 2024-08-12: qty 0.5

## 2024-08-12 MED ORDER — PENTAFLUOROPROP-TETRAFLUOROETH EX AERO
1.0000 | INHALATION_SPRAY | CUTANEOUS | Status: DC | PRN
Start: 2024-08-12 — End: 2024-08-12

## 2024-08-12 MED ORDER — LIDOCAINE 5 % EX PTCH
1.0000 | MEDICATED_PATCH | CUTANEOUS | Status: DC
  Administered 2024-08-12 – 2024-08-16 (×4): 1 via TRANSDERMAL
  Filled 2024-08-12 (×5): qty 1

## 2024-08-12 MED ORDER — LIDOCAINE-PRILOCAINE 2.5-2.5 % EX CREA: 1.0000 | TOPICAL_CREAM | CUTANEOUS | Status: DC | PRN

## 2024-08-12 MED ORDER — MIDAZOLAM HCL 2 MG/2ML IJ SOLN
INTRAMUSCULAR | Status: AC | PRN
  Administered 2024-08-12: 1 mg via INTRAVENOUS
  Administered 2024-08-12: .5 mg via INTRAVENOUS

## 2024-08-12 MED ORDER — APIXABAN 2.5 MG PO TABS
2.5000 mg | ORAL_TABLET | Freq: Two times a day (BID) | ORAL | Status: DC
  Administered 2024-08-12 – 2024-08-17 (×10): 2.5 mg via ORAL
  Filled 2024-08-12 (×10): qty 1

## 2024-08-12 MED ORDER — MIDAZOLAM HCL 2 MG/2ML IJ SOLN
INTRAMUSCULAR | Status: AC
  Filled 2024-08-12: qty 2

## 2024-08-12 MED ORDER — HEPARIN SODIUM (PORCINE) 1000 UNIT/ML IJ SOLN
1000.0000 [IU] | Freq: Once | INTRAMUSCULAR | Status: AC
  Administered 2024-08-12: 1000 [IU] via INTRAVENOUS

## 2024-08-12 MED ORDER — HYDROMORPHONE HCL 1 MG/ML IJ SOLN
INTRAMUSCULAR | Status: AC
  Filled 2024-08-12: qty 0.5

## 2024-08-12 MED ORDER — HYDROMORPHONE HCL 1 MG/ML IJ SOLN
0.5000 mg | Freq: Four times a day (QID) | INTRAMUSCULAR | Status: DC | PRN
  Administered 2024-08-12 – 2024-08-13 (×3): 0.5 mg via INTRAVENOUS
  Filled 2024-08-12 (×2): qty 0.5

## 2024-08-12 MED ORDER — FENTANYL CITRATE (PF) 100 MCG/2ML IJ SOLN
INTRAMUSCULAR | Status: AC | PRN
  Administered 2024-08-12 (×2): 25 ug via INTRAVENOUS

## 2024-08-12 MED ORDER — HEPARIN SODIUM (PORCINE) 1000 UNIT/ML IJ SOLN
INTRAMUSCULAR | Status: AC
  Filled 2024-08-12: qty 10

## 2024-08-12 MED ORDER — HEPARIN SODIUM (PORCINE) 1000 UNIT/ML IJ SOLN
INTRAMUSCULAR | Status: AC
  Filled 2024-08-12: qty 4

## 2024-08-12 MED ORDER — DICLOFENAC SODIUM 1 % EX GEL
4.0000 g | Freq: Four times a day (QID) | CUTANEOUS | Status: DC
  Administered 2024-08-12 – 2024-08-14 (×7): 4 g via TOPICAL
  Filled 2024-08-12 (×2): qty 100

## 2024-08-12 MED ORDER — ALTEPLASE 2 MG IJ SOLR: 2.0000 mg | Freq: Once | INTRAMUSCULAR | Status: DC | PRN

## 2024-08-12 NOTE — Progress Notes (Signed)
   08/12/24 1541  Vitals  Temp 98.1 F (36.7 C)  Pulse Rate 73  Resp 15  BP 131/88  SpO2 99 %  O2 Device Room Air  Weight 64.4 kg  Type of Weight Post-Dialysis  Oxygen  Therapy  Patient Activity (if Appropriate) In bed  Pulse Oximetry Type Continuous  Oximetry Probe Site Changed No  Post Treatment  Dialyzer Clearance Lightly streaked  Hemodialysis Intake (mL) 0 mL  Liters Processed 24  Fluid Removed (mL) 0 mL  Tolerated HD Treatment Yes   Received patient in bed to unit.  Alert and oriented.  Informed consent signed and in chart.   TX duration: 2  Patient tolerated well.  Transported back to the room  Alert, without acute distress.  Hand-off given to patient's nurse.   Access used: Yes Access issues: No  Total UF removed: 0 Medication(s) given: See MAR Post HD VS: See Above Grid Post HD weight: 64.4 kg   Zebedee DELENA [facility]

## 2024-08-12 NOTE — Progress Notes (Signed)
 OT Cancellation Note  Patient Details Name: Connor Wilkerson MRN: [id number] DOB: 09/03/1941   Cancelled Treatment:    Reason Eval/Treat Not Completed: Patient at procedure or test/ unavailable (Pt having HD catheter procedure and now in HD.)  [name] 08/12/2024, 1:54 PM Mliss HERO, OTR/L Acute Rehabilitation Services Office: [phone]

## 2024-08-12 NOTE — NC FL2 (Signed)
 [place]  MEDICAID FL2 LEVEL OF CARE FORM     IDENTIFICATION  Patient Name: Connor Wilkerson Birthdate: Jan 17, 1941 Sex: male Admission Date (Current Location): 08/10/2024  [facility] and [place] Number:  Producer, television/film/video and Address:  [facility], [address], [place], [place] [address]      Provider Number: [id number]  Attending Physician Name and Address:  Trudy Mliss Dragon, MD  Relative Name and Phone Number:  Kittie Moats Daughter [phone]    Current Level of Care: Hospital Recommended Level of Care: Skilled Nursing Facility Prior Approval Number:    Date Approved/Denied:   PASRR Number: [id number]  Discharge Plan: SNF    Current Diagnoses: Patient Active Problem List   Diagnosis Date Noted   Protein-calorie malnutrition, severe 08/11/2024   Acute renal failure 08/10/2024   Malaise 08/10/2024   Back pain 08/10/2024   Macrocytic anemia 08/10/2024   Thrombocytopenia 08/10/2024   PAD (peripheral artery disease) 08/10/2024   Encephalopathy 08/10/2024   Cervical radiculopathy 06/02/2024   Lesion of ulnar nerve 06/02/2024   Osteoarthritis 06/02/2024   Shoulder pain 06/02/2024   Skin sensation disturbance 06/02/2024   S/P shoulder replacement, right 10/16/2023   Carotid artery disease 08/06/2022   PVC (premature ventricular contraction) 04/04/2022   Orthostatic hypotension 08/27/2020   Atrial fibrillation (HCC) 02/29/2020   Hypercoagulable state 02/29/2020   CAD (coronary artery disease) 12/07/2018   Hyperlipidemia 12/07/2018   CKD (chronic kidney disease) stage 3, GFR 30-59 ml/min (HCC) 12/07/2018   Ischemic cardiomyopathy    Essential hypertension 09/22/2016   Restless leg syndrome 07/14/2016   AKI (acute kidney injury) 04/11/2016   Hypotension 04/11/2016   Abnormality of gait 02/07/2015   Near syncope 09/21/2014   Repeated falls 12/15/2013   OSA (obstructive sleep apnea) 12/15/2013   Chronic systolic CHF  (congestive heart failure) (HCC) 01/01/2010   CARDIOMYOPATHY, ISCHEMIC 12/31/2009   DYSPNEA 12/31/2009   Implantable cardioverter-defibrillator (ICD) in situ 12/31/2009    Orientation RESPIRATION BLADDER Height & Weight     Self  Normal Continent Weight: 146 lb 6.2 oz (66.4 kg) Height:     BEHAVIORAL SYMPTOMS/MOOD NEUROLOGICAL BOWEL NUTRITION STATUS        Diet (see discharge summary)  AMBULATORY STATUS COMMUNICATION OF NEEDS Skin   Limited Assist Verbally Skin abrasions, Other (Comment) (redness)                       Personal Care Assistance Level of Assistance  Bathing, Feeding, Dressing Bathing Assistance: Limited assistance Feeding assistance: Limited assistance Dressing Assistance: Limited assistance     Functional Limitations Info             SPECIAL CARE FACTORS FREQUENCY  PT (By licensed PT), OT (By licensed OT)     PT Frequency: 5x week OT Frequency: 5x week            Contractures Contractures Info: Not present    Additional Factors Info  Code Status, Allergies Code Status Info: full Allergies Info: tramadol           Current Medications (08/12/2024):  This is the current hospital active medication list Current Facility-Administered Medications  Medication Dose Route Frequency Provider Last Rate Last Admin   acetaminophen  (TYLENOL ) tablet 1,000 mg  1,000 mg Oral Q6H WA Norrine Sharper, MD   1,000 mg at 08/11/24 2031   Chlorhexidine  Gluconate Cloth 2 % PADS 6 each  6 each Topical Q0600 Norine Manuelita LABOR, MD   6 each  at 08/12/24 0542   feeding supplement (ENSURE PLUS HIGH PROTEIN) liquid 237 mL  237 mL Oral BID BM Trudy Mliss Dragon, MD       HYDROmorphone  (DILAUDID ) injection 0.5 mg  0.5 mg Intravenous Once Kruska, Lindsay A, MD       polyethylene glycol (MIRALAX  / GLYCOLAX ) packet 17 g  17 g Oral Daily PRN McLendon, Michael, MD       traZODone  (DESYREL ) tablet 50 mg  50 mg Oral QHS PRN Lyles, Elliott, DO   50 mg at 08/11/24 2031      Discharge Medications: Please see discharge summary for a list of discharge medications.  Relevant Imaging Results:  Relevant Lab Results:   Additional Information SSN: [id number].  Pt will require HD, has not been arranged in the community yet.  Bridget Cordella Simmonds, LCSW

## 2024-08-12 NOTE — Sedation Documentation (Signed)
 Patient refusing to have tunn hdc placement. Daughter not available to discuss with patient. Will return to room.

## 2024-08-12 NOTE — Plan of Care (Addendum)

## 2024-08-12 NOTE — Progress Notes (Addendum)
 [facility] Progress Note   Subjective:   Seen in room - he's alone. For HD catheter today  but refused when taken to IR so back in room.  He is AOx3 but not really able to answer more complex questions.  He says he refused the catheter due to pain - bladder and back pain. I/Os yest - ins not doc/UOP .   Objective Vitals:   08/11/24 1404 08/12/24 0001 08/12/24 0005 08/12/24 0500  BP: 138/69  (!) 159/69   Pulse: 73  69   Resp: 17 18    Temp: 98.5 F (36.9 C)  98 F (36.7 C)   TempSrc:   Oral   SpO2: 99%  98%   Weight:    66.4 kg   Physical Exam Gen: nontoxic but chronically ill appearing Eyes:  EOMI ENT: poor dentition, M MM Neck: supple, no JVD CV: RRR, no rub appreciated Abd:  soft, mildly TTP through, no rebound or guarding, NABS Back: clear lungs GU: no foley Extr:  no edema Neuro:  AOx3 and self cone 2025, difficulty with details   Additional Objective Labs: Basic Metabolic Panel: Recent Labs  Lab 08/10/24 0537 08/10/24 0545 08/11/24 0313 08/12/24 0400  NA 136 141 139 137  K 5.1 5.3* 4.5 4.8  CL 110 112* 106 102  CO2 11*  --  18* 19*  GLUCOSE 112* 108* 130* 99  BUN 153* >130* 146* 142*  CREATININE 11.59* 12.40* 10.90* 10.80*  CALCIUM  8.6*  --  8.3* 8.0*  PHOS  --   --  7.7* 8.0*   Liver Function Tests: Recent Labs  Lab 08/10/24 0537 08/11/24 0313 08/12/24 0400  AST 33  --   --   ALT 32  --   --   ALKPHOS 50  --   --   BILITOT 0.6  --   --   PROT 6.2*  --   --   ALBUMIN  3.7 3.1* 3.0*   No results for input(s): LIPASE, AMYLASE in the last 168 hours. CBC: Recent Labs  Lab 08/10/24 0537 08/10/24 0545 08/11/24 0313 08/12/24 0400  WBC 7.6  --  6.7 7.5  NEUTROABS 5.5  --   --   --   HGB 8.0* 8.5* 7.0* 7.2*  HCT 24.4* 25.0* 20.5* 21.4*  MCV 100.4*  --  95.8 96.8  PLT 134*  --  125* 119*   Blood Culture    Component Value Date/Time   SDES  06/17/2023 1016    TOE TISSUE AMPUTATION Performed at [facility], [address] [place], [place] [address]    SPECREQUEST  06/17/2023 1016    NONE Performed at [facility], [address], [place], [place] [address]    CULT  06/17/2023 1016    No growth aerobically or anaerobically. Performed at [facility], [address] [place], [place] [address]    REPTSTATUS 06/22/2023 FINAL 06/17/2023 1016    Cardiac Enzymes: Recent Labs  Lab 08/10/24 0742  CKTOTAL 1,210*   CBG: Recent Labs  Lab 08/10/24 1338  GLUCAP 112*   Iron Studies:  Recent Labs    08/11/24 0313  IRON 137  TIBC 199*  FERRITIN 162   @[email or web address] @ Studies/Results: CT ABDOMEN PELVIS WO CONTRAST Result Date: 08/10/2024 EXAM: CT ABDOMEN AND PELVIS WITHOUT CONTRAST 08/10/2024 10:44:36 AM TECHNIQUE: CT of the abdomen and pelvis was performed without the administration of intravenous contrast. Multiplanar reformatted images are provided for review. Automated exposure control, iterative  reconstruction, and/or weight-based adjustment of the mA/kV was utilized to reduce the radiation dose to as low as reasonably achievable. COMPARISON: CT dated 03/23/23. CLINICAL HISTORY: Abdominal pain. FINDINGS: LOWER CHEST: Mild dependent atelectasis in the lower lobes. Aicd leads noted. LIVER: The liver is unremarkable. GALLBLADDER AND BILE DUCTS: Gallbladder is unremarkable. No biliary ductal dilatation. SPLEEN: No acute abnormality. PANCREAS: No acute abnormality. ADRENAL GLANDS: No acute abnormality. KIDNEYS, URETERS AND BLADDER: The small exophytic region of concern from the left kidney upper pole shown on prior exams is obscured by motion artifact on today's exams but appears roughly similar to previous. This lesion remains indeterminate. No stones in the kidneys or ureters. No hydronephrosis. No perinephric or periureteral stranding. Urinary bladder is unremarkable. GI AND BOWEL: Scattered sigmoid colon diverticula. Stomach demonstrates no acute abnormality.  There is no bowel obstruction. PERITONEUM AND RETROPERITONEUM: No ascites. No free air. VASCULATURE: Systemic atherosclerosis is present, including the aorta and iliac arteries. Coronary and aortic atherosclerosis. LYMPH NODES: No lymphadenopathy. REPRODUCTIVE ORGANS: No acute abnormality. BONES AND SOFT TISSUES: Dextroconvex lumbar scoliosis with rotary component. Thoracic and lumbar spondylosis and degenerative disc disease with resulting moderate right foraminal impingement at L2-L3, L3-L4, and L4-L5, and moderate left foraminal impingement at L2-L3. Mild grade 1 degenerative retrolisthesis at all levels between L1 and S1. Acetabular labral chondrocalcinosis bilaterally. Small left groin hernia contains adipose tissue. No acute osseous abnormality. No focal soft tissue abnormality. IMPRESSION: 1. Small exophytic lesion at the left renal upper pole remains indeterminate due to motion artifact. 2. No acute findings in the abdomen or pelvis. 3. Systemic atherosclerosis, including aorta and iliac arteries. 4. Coronary atherosclerosis. 5. Thoracic and lumbar spondylosis and degenerative disc disease with moderate right foraminal impingement at L3-L4 and L4-L5, and moderate left foraminal impingement at L2-L3. 6. Mild grade 1 degenerative retrolisthesis from L1 to S1. 7. Acetabular labral chondrocalcinosis bilaterally. 8. Dextroconvex lumbar scoliosis with rotary component. Electronically signed by: Ryan Salvage MD 08/10/2024 11:15 AM EDT RP Workstation: [id number]   Medications:   ceFAZolin  (ANCEF ) IV      acetaminophen   1,000 mg Oral Q6H WA   Chlorhexidine  Gluconate Cloth  6 each Topical Q0600   feeding supplement  237 mL Oral BID BM   Assessment/Plan Connor Wilkerson is an 83 y.o. male with HFrEF (last TTE 2019 EF 35%), CAD, ICD, HTN, HL, A fib presenting with weakness and diarrhea x 2 weeks who is found to have severe AKI on CKD for which nephrology is consulted.    **Severe AKI on CKD 4:  3 wk  history of symptoms - unclear if this is prerenal state leading to AKI on CKD vs progressive CKD presenting with uremic symptoms.  UA dip +bld, 1+ protein but no RBC on microscopy (CK ~1000  hold statin), FeNa 5%, U/P pending, Myeloma w/u pending.  CT a/p no obstruction.  With mild thrombocytopenia smear checked - no schistocytes.  Holding ARB.   Has not had substantial improvement with hydration and poor appetite could be uremic symptoms - recommend dialysis at this time.  Discussed with patient and wife - plan proceed with IR temp HD catheter placement and HD #1 today --> pt refused line in IR but says he will proceed if pain treated, will give hydromorphone  0.5mg  IV now.  Avoid extremes of BP and nephrotoxins.  Strict I/Os, daily weights.  Qshift PVR today.    **HFrEF:  last known EF 35%. appearing euvolemic after hydration.  Hold entresto  in setting of severe AKI.     **  HTN: holding entresto .  BP slightly higher but watch for now do not want hypotension   **NAGMA: in setting of severe AKI, bicarb ok today off gtt, HD today planned  **HL:  CK mildly elevated and with AKI hold statin   **thrombocytopenia:  mild, smear normal plt morphology - stable in 120s, CTM   **Anemia:  macrocytic, iron, B12 folate all ok. Pending SPEP, UPEP, SFLC.  Hb trending down check hemoccult, transfuse < 7.    **Falls/debility:  r/o uremia, other w/u per primary   Will follow reach out with concerns.   Did not reach wife on phone this AM.  Daughter's phone went to VM too.   D/w primary team and IR.   Manuelita Barters MD 08/12/2024, 7:42 AM   [facility] Pager: [phone]

## 2024-08-12 NOTE — TOC Initial Note (Signed)
 Transition of Care [facility]) - Initial/Assessment Note    Patient Details  Name: Connor Wilkerson MRN: [id number] Date of Birth: Jan 07, 1941  [facility]) CM/SW Contact:    Connor Cordella Simmonds, LCSW Phone Number: 08/12/2024, 10:34 AM  Clinical Narrative:    Pt oriented x1, CSW spoke with daughter Connor Wilkerson.  Lots going on, pt wife scheduled for back surgery tomorrow.  Connor Wilkerson in Connor Wilkerson.  Pt from home with wife, pt is usually the caregiver as wife is in wheelchair.  HH aide in placed at home.  Discussed PT recommendation for SNF.  Connor Wilkerson is agreeable to this.  Pt in Connor Wilkerson, would like all options there and in Connor Wilkerson.  Permission given to send out referral in the hub. Connor Wilkerson with questions regarding status of HD, reports that in the past pt has stated he would not want to be on HD.  Message sent to MD requesting update to daughter.          Referral sent out in hub for SNF.        Expected Discharge Plan: Skilled Nursing Facility Barriers to Discharge: Continued Medical Work up, SNF Pending bed offer   Patient Goals and CMS Choice     Choice offered to / list presented to : Adult Children (daughter Connor Wilkerson)      Expected Discharge Plan and Services In-house Referral: Clinical Social Work   Post Acute Care Choice: Skilled Nursing Facility Living arrangements for the past 2 months: Single Family Home                                      Prior Living Arrangements/Services Living arrangements for the past 2 months: Single Family Home Lives with:: Spouse Patient language and need for interpreter reviewed:: Yes        Need for Family Participation in Patient Care: Yes (Comment) Care giver support system in place?: Yes (comment) Current home services: Homehealth aide Criminal Activity/Legal Involvement Pertinent to Current Situation/Hospitalization: No - Comment as needed  Activities of Daily Living   ADL Screening (condition at time  of admission) Independently performs ADLs?: Yes (appropriate for developmental age) Is the patient deaf or have difficulty hearing?: Yes Does the patient have difficulty seeing, even when wearing glasses/contacts?: Yes Does the patient have difficulty concentrating, remembering, or making decisions?: Yes  Permission Sought/Granted                  Emotional Assessment Appearance:: Appears stated age Attitude/Demeanor/Rapport: Unable to Assess Affect (typically observed): Unable to Assess Orientation: : Oriented to Self      Admission diagnosis:  Acute renal failure [N17.9] Acute renal failure, unspecified acute renal failure type [N17.9] Patient Active Problem List   Diagnosis Date Noted   Protein-calorie malnutrition, severe 08/11/2024   Acute renal failure 08/10/2024   Malaise 08/10/2024   Back pain 08/10/2024   Macrocytic anemia 08/10/2024   Thrombocytopenia 08/10/2024   PAD (peripheral artery disease) 08/10/2024   Encephalopathy 08/10/2024   Cervical radiculopathy 06/02/2024   Lesion of ulnar nerve 06/02/2024   Osteoarthritis 06/02/2024   Shoulder pain 06/02/2024   Skin sensation disturbance 06/02/2024   S/P shoulder replacement, right 10/16/2023   Carotid artery disease 08/06/2022   PVC (premature ventricular contraction) 04/04/2022   Orthostatic hypotension 08/27/2020   Atrial fibrillation (HCC) 02/29/2020   Hypercoagulable state 02/29/2020   CAD (coronary artery disease) 12/07/2018   Hyperlipidemia 12/07/2018  CKD (chronic kidney disease) stage 3, GFR 30-59 ml/min (HCC) 12/07/2018   Ischemic cardiomyopathy    Essential hypertension 09/22/2016   Restless leg syndrome 07/14/2016   AKI (acute kidney injury) 04/11/2016   Hypotension 04/11/2016   Abnormality of gait 02/07/2015   Near syncope 09/21/2014   Repeated falls 12/15/2013   OSA (obstructive sleep apnea) 12/15/2013   Chronic systolic CHF (congestive heart failure) (HCC) 01/01/2010   CARDIOMYOPATHY,  ISCHEMIC 12/31/2009   DYSPNEA 12/31/2009   Implantable cardioverter-defibrillator (ICD) in situ 12/31/2009   PCP:  Connor Vicenta BRAVO, MD Pharmacy:   [facility] [phone] - [place], [place] - [address] [facility] [place] [address] Phone: [phone] Fax: [phone]     Social Drivers of Health (SDOH) Social History: SDOH Screenings   Food Insecurity: No Food Insecurity (08/10/2024)  Housing: Low Risk  (08/10/2024)  Transportation Needs: No Transportation Needs (08/10/2024)  Utilities: Not At Risk (08/10/2024)  Social Connections: Patient Declined (08/10/2024)  Tobacco Use: Medium Risk (08/10/2024)   SDOH Interventions:     Readmission Risk Interventions     No data to display

## 2024-08-12 NOTE — Plan of Care (Signed)

## 2024-08-12 NOTE — Procedures (Signed)
 I was present at this dialysis session, have reviewed the session itself and made  appropriate changes  Pt ended up having TDC placed today and is seen at his 1st HD treatment.  He is tolerating it fine.    Manuelita Barters MD [facility] pager [phone]   08/12/2024, 2:44 PM

## 2024-08-12 NOTE — Progress Notes (Addendum)
 HD#2 SUBJECTIVE:  Patient Summary: Connor Wilkerson is a 83 year old gentleman with past medical history of CAD, CAD with ICD, HLP, CKD stage III, paroxysmal a-fib on DOAC, hypertension, came here with chief complaint of malaise and fatigue after 2 weeks of diarrhea, admitted here for AKI.   Overnight Events: IV line removed, did not let nursing to replace.  Interim History: Patient is evaluated at bedside in the afternoon post [facility] and HD with wife present, patient is awake, oriented to self only. He denies any acute pain at this time. Mostly groans on exam.   OBJECTIVE:  Vital Signs: Vitals:   08/12/24 1500 08/12/24 1530 08/12/24 1539 08/12/24 1541  BP: 112/65 127/83 127/83 131/88  Pulse: 71 96 74 73  Resp: 19 (!) 21 12 15   Temp:    98.1 F (36.7 C)  TempSrc:      SpO2: 98% 96% 99% 99%  Weight:    64.4 kg   Supplemental O2: Room Air SpO2: 99 % O2 Flow Rate (L/min): 2 L/min  Filed Weights   08/12/24 0500 08/12/24 1323 08/12/24 1541  Weight: 66.4 kg 62.6 kg 64.4 kg     Intake/Output Summary (Last 24 hours) at 08/12/2024 1731 Last data filed at 08/12/2024 1541 Gross per 24 hour  Intake --  Output 425 ml  Net -425 ml   Net IO Since Admission: 1,135.1 mL [08/12/24 1731]  Physical Exam: Physical Exam General: Laying in bed, ill appearing, cachetic Cardiovascular: RR. + R TDC  Pulm: Breathing comfortably Jai:dnqu, NT MSK: No LE edema, moves extremities spontaneously Neuro: AO self. Groins. Follows commands.  Patient Lines/Drains/Airways Status     Active Line/Drains/Airways     Name Placement date Placement time Site Days   Peripheral IV 08/10/24 20 G 1 Anterior;Right Forearm 08/10/24  0529  Forearm  2   Hemodialysis Catheter Right Internal jugular Double lumen Permanent (Tunneled) 08/12/24  1110  Internal jugular  less than 1            Pertinent labs and imaging:      Latest Ref Rng & Units 08/12/2024    4:00 AM 08/11/2024    3:13 AM 08/10/2024    5:45 AM   CBC  WBC 4.0 - 10.5 K/uL 7.5  6.7    Hemoglobin 13.0 - 17.0 g/dL 7.2  7.0  8.5   Hematocrit 39.0 - 52.0 % 21.4  20.5  25.0   Platelets 150 - 400 K/uL 119  125         Latest Ref Rng & Units 08/12/2024    4:00 AM 08/11/2024    3:13 AM 08/10/2024    5:45 AM  CMP  Glucose 70 - 99 mg/dL 99  869  891   BUN 8 - 23 mg/dL 857  853  >869   Creatinine 0.61 - 1.24 mg/dL 89.19  89.09  87.59   Sodium 135 - 145 mmol/L 137  139  141   Potassium 3.5 - 5.1 mmol/L 4.8  4.5  5.3   Chloride 98 - 111 mmol/L 102  106  112   CO2 22 - 32 mmol/L 19  18    Calcium  8.9 - 10.3 mg/dL 8.0  8.3      IR TUNNELED CENTRAL VENOUS CATH PLC W IMG Result Date: 08/12/2024 INDICATION: ESRD requiring dialysis initiation EXAM: TUNNELED CENTRAL VENOUS HEMODIALYSIS CATHETER PLACEMENT WITH ULTRASOUND AND FLUOROSCOPIC GUIDANCE MEDICATIONS: Ancef  2 gm IV. The antibiotic was given in an appropriate time interval prior to skin puncture.  ANESTHESIA/SEDATION: Moderate (conscious) sedation was employed during this procedure. A total of Versed  1.5 mg and Fentanyl  50 mcg was administered intravenously. Moderate Sedation Time: 16 minutes. The patient's level of consciousness and vital signs were monitored continuously by radiology nursing throughout the procedure under my direct supervision. FLUOROSCOPY: Radiation Exposure Index and estimated peak skin dose (PSD); Reference air kerma (RAK), 1.0 mGy. COMPLICATIONS: None immediate. PROCEDURE: Informed written consent was obtained from the patient and/or patient's representative after a discussion of the risks, benefits, and alternatives to treatment. Questions regarding the procedure were encouraged and answered. The RIGHT neck and chest were prepped with chlorhexidine  in a sterile fashion, and a sterile drape was applied covering the operative field. Maximum barrier sterile technique with sterile gowns and gloves were used for the procedure. A timeout was performed prior to the initiation of the  procedure. After creating a small venotomy incision, a micropuncture kit was utilized to access the internal jugular vein. Real-time ultrasound guidance was utilized for vascular access including the acquisition of a permanent ultrasound image documenting patency of the accessed vessel. The microwire was utilized to measure appropriate catheter length. A stiff Glidewire was advanced to the level of the IVC and the micropuncture sheath was exchanged for a peel-away sheath. A palindrome tunneled hemodialysis catheter measuring 23 cm from tip to cuff was tunneled in a retrograde fashion from the anterior chest wall to the venotomy incision. The catheter was then placed through the peel-away sheath with tips ultimately positioned within the superior aspect of the right atrium. Final catheter positioning was confirmed and documented with a spot radiographic image. The catheter aspirates and flushes normally. The catheter was flushed with appropriate volume heparin  dwells. The catheter exit site was secured with a 2-0 Ethilon retention suture. The venotomy incision was closed with Dermabond. Dressings were applied. The patient tolerated the procedure well without immediate post procedural complication. IMPRESSION: Successful placement of 23 cm tip to cuff tunneled hemodialysis catheter via the RIGHT internal jugular vein. The tip of the catheter is positioned within the proximal RIGHT atrium. The catheter is ready for immediate use. Thom Hall, MD [facility] Electronically Signed   By: Thom Hall M.D.   On: 08/12/2024 12:50    ASSESSMENT/PLAN:  Assessment: Principal Problem:   Acute renal failure Active Problems:   Ischemic cardiomyopathy   Atrial fibrillation (HCC)   Hypercoagulable state   Malaise   Back pain   Macrocytic anemia   Thrombocytopenia   PAD (peripheral artery disease)   Encephalopathy   Protein-calorie malnutrition,  severe   Plan: Connor Wilkerson is a 83 year old gentleman with past medical history of CAD, CAD with ICD, HLP, CKD stage III, paroxysmal a-fib on DOAC, hypertension, came here with chief complaint of malaise and fatigue after 2 weeks of diarrhea, admitted here for elevated creatinine (12).   #AKI on CKD #Confusion #Malaise Cr: 10.8, BUN:142 Carlin PARAS. Ohlin is an 83 year old male with HFrEF (EF 35% on last TTE 2019), CAD, ICD, HTN, hyperlipidemia, and atrial fibrillation, presenting with weakness and diarrhea x 2 weeks, found to have severe AKI on CKD stage IV. Oliguria suspected (output ~400 mL/24h).  No obstructive uropathy (CT A/P negative). Exam: dehydrated, poor skin turgor. History of poor oral intake. UA largely bland; 1+ blood, 1+ protein, no RBCs on microscopy. FeNa 5%, urine/serum studies pending. No NSAID use outside hospital. Mild thrombocytopenia noted; smear shows normal platelet morphology. Nephrology consulted. Plan: - Likely pre-renal AKI vs progressive CKD with  uremic symptoms. - Dialysis indicated: Pt ended up having TDC placed today and S/p HD. He is tolerating it fine.  - Strict I/Os, daily weights, monitor PVR each shift. - Avoid extremes of BP and nephrotoxins. - Hold RAAS blockers/Entresto  due to AKI and HFrEF. - Monitor labs: electrolytes, kidney function, FeNa, urine studies, myeloma work-up pending. - CK :1200; statin held.  #Normocytic Anemia HB: 7.2 (up from 7), MCV:95.  Iron:137, B12:249, folate: normal.DDX: MM, Anemia of chronic disease, Bone marrow decreased production - SPEP, UPEP, SFLC pending. - Hb trending down;Transfuse if Hb <7 g/dL.   #Back Pain Patient has thoracic and lumbar spondylosis with degenerative disc disease and moderate central spinal stenosis at L3-L5. Imaging shows:Right foraminal impingement at L3-L4 and L4-L5, Moderate left foraminal impingement at L2-L3 - Continue pain management tailored to his comorbidities (HFrEF, CKD, and  AKI) - Avoid nephrotoxic or cardiodepressant medications - Monitor for changes in mobility, neurologic symptoms, or worsening pain - Consider physical therapy or consult spine/pain specialist if pain persists or worsens  #Falls/Debility #Poor Oral Intake  Consider uremia as contributing factor. Primary team to continue work-up.  #Recovered HFrEF Appears euvolemic after overnight hydration. Last Echo 2023: EF:55-60 - Entresto  held in setting of severe AKI.  #HTN BP slightly elevated; monitor closely. - Avoid hypotension; continue holding Entresto .   #Atrial Fibrillation (HCC) Chronic, paroxysmal. Low AF burden by ICD interrogation. Rhythm currently sinus. - Resume outpatient Eliquis  2.5 mg BID.  #NAGMA Improved, bicarb: 19, AG:16  Present in context of severe AKI. - Improving with bicarbonate infusion, continue until dialysis.  #Hyperkalemia - Resolved following Lokelma administration.  #Hyperlipidemia - CK mildly elevated; statin held due to AKI.  #Thrombocytopenia Mild, stable in 120s. Smear shows normal platelet morphology. Continue monitoring.    Best Practice: Diet: Regular diet IVF: Fluids: sodium bicarbonate infusion  VTE: apixaban  (ELIQUIS ) tablet 2.5 mg Start: 08/10/24 1000 Code: Full, but wouldn't want life-sustaining measures if he couldn't be liberated from ventilator    Disposition planning: Therapy Recs: None,  Family Contact: Wife DISPO: Anticipated discharge in 2 days to Home pending medical Signature:  Kamyia Thomason [facility]  5:31 PM, 08/12/2024  On Call pager [phone]

## 2024-08-12 NOTE — Procedures (Signed)
 [facility]ascular [facility] Interventional [facility]iology Procedure Note  Patient: Connor Wilkerson DOB: 04/04/1941 Medical Record Number: [id number] Note Date/Time: 08/12/24 11:25 AM   Performing Physician: Thom Hall, MD Assistant(s): None  Diagnosis: ESRD requiring Hemodialysis  Procedure: TUNNELED HEMODIALYSIS CATHETER PLACEMENT  Anesthesia: Conscious Sedation Complications: None Estimated Blood Loss: Minimal Specimens:  None  Findings:  Successful placement of right-sided, 23 cm (tip-to-cuff), tunneled hemodialysis catheter with the tip of the catheter in the proximal right atrium.  Plan: Catheter ready for use.  See detailed procedure note with images in PACS. The patient tolerated the procedure well without incident or complication and was returned to Recovery in stable condition.    Thom Hall, MD [facility]   Pager. [phone] Clinic. [phone]

## 2024-08-12 NOTE — Sedation Documentation (Signed)
 Dr. Jenna at bedside to discuss case with pt.

## 2024-08-13 DIAGNOSIS — Z992 Dependence on renal dialysis: Secondary | ICD-10-CM | POA: Diagnosis not present

## 2024-08-13 DIAGNOSIS — I110 Hypertensive heart disease with heart failure: Secondary | ICD-10-CM

## 2024-08-13 DIAGNOSIS — D638 Anemia in other chronic diseases classified elsewhere: Secondary | ICD-10-CM

## 2024-08-13 DIAGNOSIS — I5030 Unspecified diastolic (congestive) heart failure: Secondary | ICD-10-CM

## 2024-08-13 DIAGNOSIS — F32A Depression, unspecified: Secondary | ICD-10-CM

## 2024-08-13 DIAGNOSIS — N184 Chronic kidney disease, stage 4 (severe): Secondary | ICD-10-CM | POA: Diagnosis not present

## 2024-08-13 DIAGNOSIS — Z79899 Other long term (current) drug therapy: Secondary | ICD-10-CM

## 2024-08-13 DIAGNOSIS — I4891 Unspecified atrial fibrillation: Secondary | ICD-10-CM

## 2024-08-13 DIAGNOSIS — N179 Acute kidney failure, unspecified: Secondary | ICD-10-CM | POA: Diagnosis not present

## 2024-08-13 LAB — CBC
HCT: 21.9 % — ABNORMAL LOW (ref 39.0–52.0)
Hemoglobin: 7.3 g/dL — ABNORMAL LOW (ref 13.0–17.0)
MCH: 32.6 pg (ref 26.0–34.0)
MCHC: 33.3 g/dL (ref 30.0–36.0)
MCV: 97.8 fL (ref 80.0–100.0)
Platelets: 122 K/uL — ABNORMAL LOW (ref 150–400)
RBC: 2.24 MIL/uL — ABNORMAL LOW (ref 4.22–5.81)
RDW: 12.3 % (ref 11.5–15.5)
WBC: 7 K/uL (ref 4.0–10.5)
nRBC: 0 % (ref 0.0–0.2)

## 2024-08-13 LAB — RENAL FUNCTION PANEL
Albumin: 3.3 g/dL — ABNORMAL LOW (ref 3.5–5.0)
Albumin: 3.3 g/dL — ABNORMAL LOW (ref 3.5–5.0)
Anion gap: 14 (ref 5–15)
Anion gap: 12 (ref 5–15)
BUN: 49 mg/dL — ABNORMAL HIGH (ref 8–23)
BUN: 52 mg/dL — ABNORMAL HIGH (ref 8–23)
CO2: 23 mmol/L (ref 22–32)
CO2: 26 mmol/L (ref 22–32)
Calcium: 8.4 mg/dL — ABNORMAL LOW (ref 8.9–10.3)
Calcium: 8.4 mg/dL — ABNORMAL LOW (ref 8.9–10.3)
Chloride: 99 mmol/L (ref 98–111)
Chloride: 99 mmol/L (ref 98–111)
Creatinine, Ser: 4.9 mg/dL — ABNORMAL HIGH (ref 0.61–1.24)
Creatinine, Ser: 5.31 mg/dL — ABNORMAL HIGH (ref 0.61–1.24)
GFR, Estimated: 11 mL/min — ABNORMAL LOW (ref >60)
GFR, Estimated: 10 mL/min — ABNORMAL LOW (ref >60)
Glucose, Bld: 102 mg/dL — ABNORMAL HIGH (ref 70–99)
Glucose, Bld: 103 mg/dL — ABNORMAL HIGH (ref 70–99)
Phosphorus: 3.9 mg/dL (ref 2.5–4.6)
Phosphorus: 5.4 mg/dL — ABNORMAL HIGH (ref 2.5–4.6)
Potassium: 3.8 mmol/L (ref 3.5–5.1)
Potassium: 4.1 mmol/L (ref 3.5–5.1)
Sodium: 136 mmol/L (ref 135–145)
Sodium: 137 mmol/L (ref 135–145)

## 2024-08-13 LAB — CK: Total CK: 1719 U/L — ABNORMAL HIGH (ref 49–397)

## 2024-08-13 MED ORDER — BUPROPION HCL ER (SR) 150 MG PO TB12
150.0000 mg | ORAL_TABLET | Freq: Every day | ORAL | Status: DC
  Administered 2024-08-13 – 2024-08-17 (×5): 150 mg via ORAL
  Filled 2024-08-13 (×5): qty 1

## 2024-08-13 MED ORDER — LACTATED RINGERS IV BOLUS
1000.0000 mL | Freq: Once | INTRAVENOUS | Status: AC
  Administered 2024-08-13: 1000 mL via INTRAVENOUS

## 2024-08-13 MED ORDER — OXYCODONE HCL 5 MG PO TABS
ORAL_TABLET | ORAL | Status: AC
  Filled 2024-08-13: qty 1

## 2024-08-13 MED ORDER — DARBEPOETIN ALFA 100 MCG/0.5ML IJ SOSY
100.0000 ug | PREFILLED_SYRINGE | INTRAMUSCULAR | Status: DC
  Administered 2024-08-13: 100 ug via SUBCUTANEOUS
  Filled 2024-08-13: qty 0.5

## 2024-08-13 MED ORDER — GABAPENTIN 300 MG PO CAPS
300.0000 mg | ORAL_CAPSULE | Freq: Every day | ORAL | Status: DC
  Administered 2024-08-13 – 2024-08-16 (×4): 300 mg via ORAL
  Filled 2024-08-13 (×4): qty 1

## 2024-08-13 MED ORDER — OXYCODONE-ACETAMINOPHEN 5-325 MG PO TABS
ORAL_TABLET | ORAL | Status: AC
  Filled 2024-08-13: qty 1

## 2024-08-13 MED ORDER — HEPARIN SODIUM (PORCINE) 1000 UNIT/ML IJ SOLN
INTRAMUSCULAR | Status: AC
  Filled 2024-08-13: qty 4

## 2024-08-13 MED ORDER — ONDANSETRON 4 MG PO TBDP
4.0000 mg | ORAL_TABLET | Freq: Three times a day (TID) | ORAL | Status: DC | PRN
  Administered 2024-08-13: 4 mg via ORAL
  Filled 2024-08-13: qty 1

## 2024-08-13 MED ORDER — OXYCODONE HCL 5 MG PO TABS
5.0000 mg | ORAL_TABLET | Freq: Once | ORAL | Status: AC
  Administered 2024-08-13: 5 mg via ORAL

## 2024-08-13 NOTE — Plan of Care (Signed)

## 2024-08-13 NOTE — Progress Notes (Signed)
   08/13/24 1822  Provider Notification  Provider Name/Title Dr Napoleon  Date Provider Notified 08/13/24  Time Provider Notified 1822  Method of Notification  (secure chat)  Notification Reason Other (Comment) (nausea)  Provider response See new orders  Date of Provider Response 08/13/24  Time of Provider Response 1823   Zofran  given for nausea

## 2024-08-13 NOTE — Procedures (Signed)
 Received patient in bed to unit.  Alert and oriented.  Informed consent signed and in chart.   TX duration:  Patient tolerated well.  Transported back to the room  Alert, without acute distress.  Hand-off given to patient's nurse.   Access used: Right Chest Catheter Access issues: None  Total UF removed: 0 Medication(s) given: Oxycodone  5mg   Post HD weight: 68.6kg Post HD VS: 114/70BP, 90P, 97.9T, 100-O2  Pt was restless from start of tx. Repositioned and fed pt breakfast for comfort measures. Pt continued to be restless. Pt stated to provider he was in pain and was ordered Oxycone IR 5mg . Pt was given medication for pain but remained restless. Pt tried to continually get out of bed and kept stating that he was ready to go. Pt stated he wanted to end tx repeatedly. Charge nurse and provider made aware. Pt completed 1hr and of tx. Transported pt back to his room where his wife was waiting for him.    Maneh Sieben S [facility]

## 2024-08-13 NOTE — Progress Notes (Addendum)
 HD#3 Subjective:   Summary: Connor Wilkerson is a 83 y.o. male with PMH of CAD with ICD, HLP, CKD stage III, paroxysmal a-fib on DOAC, hypertension, came here with chief complaint of malaise and fatigue after 2 weeks of diarrhea, admitted here for encephalopathy, acute renal failure, and anemia.  Overnight Events: None  Interim history: Patient is evaluated while he is receiving HD, patient is awake, alert, oriented to self and location.  He is not oriented to time.  Reports tenderness at the [facility] site and mild abdominal pain.  Denies any nausea or vomiting.  No other concerns this morning.  Reports that he is hungry.  Objective:  Vital signs in last 24 hours: Vitals:   08/12/24 1500 08/12/24 1530 08/12/24 1539 08/12/24 1541  BP: 112/65 127/83 127/83 131/88  Pulse: 71 96 74 73  Resp: 19 (!) 21 12 15   Temp:    98.1 F (36.7 C)  TempSrc:    Oral  SpO2: 98% 96% 99% 99%  Weight:    64.4 kg   Supplemental O2: Room Air SpO2: 99 % O2 Flow Rate (L/min): 2 L/min   Physical Exam:   General: Laying in bed, no apparent distress Cardiovascular: Regular rate, +erythema and pooled blood around [facility] site trapped under the clear dressing (unchanged from yesterday), mild TTP along the [facility] site.  Pulmonary: Breathing comfortably Abdomen: Soft, nontender, nondistended MSK: No lower extremity edema bilaterally  Filed Weights   08/12/24 0500 08/12/24 1323 08/12/24 1541  Weight: 66.4 kg 62.6 kg 64.4 kg     Intake/Output Summary (Last 24 hours) at 08/13/2024 0644 Last data filed at 08/13/2024 0305 Gross per 24 hour  Intake --  Output 350 ml  Net -350 ml   Net IO Since Admission: 1,035.1 mL [08/13/24 0644]  Pertinent Labs:    Latest Ref Rng & Units 08/12/2024    4:00 AM 08/11/2024    3:13 AM 08/10/2024    5:45 AM  CBC  WBC 4.0 - 10.5 K/uL 7.5  6.7    Hemoglobin 13.0 - 17.0 g/dL 7.2  7.0  8.5   Hematocrit 39.0 - 52.0 % 21.4  20.5  25.0   Platelets 150 - 400 K/uL 119  125          Latest Ref Rng & Units 08/12/2024    5:22 PM 08/12/2024    4:00 AM 08/11/2024    3:13 AM  CMP  Glucose 70 - 99 mg/dL 80  99  869   BUN 8 - 23 mg/dL 72  857  853   Creatinine 0.61 - 1.24 mg/dL 3.35  89.19  89.09   Sodium 135 - 145 mmol/L 139  137  139   Potassium 3.5 - 5.1 mmol/L 3.7  4.8  4.5   Chloride 98 - 111 mmol/L 101  102  106   CO2 22 - 32 mmol/L 24  19  18    Calcium  8.9 - 10.3 mg/dL 8.3  8.0  8.3     Imaging: IR TUNNELED CENTRAL VENOUS CATH PLC W IMG Result Date: 08/12/2024 INDICATION: ESRD requiring dialysis initiation EXAM: TUNNELED CENTRAL VENOUS HEMODIALYSIS CATHETER PLACEMENT WITH ULTRASOUND AND FLUOROSCOPIC GUIDANCE MEDICATIONS: Ancef  2 gm IV. The antibiotic was given in an appropriate time interval prior to skin puncture. ANESTHESIA/SEDATION: Moderate (conscious) sedation was employed during this procedure. A total of Versed  1.5 mg and Fentanyl  50 mcg was administered intravenously. Moderate Sedation Time: 16 minutes. The patient's level of consciousness and vital signs were monitored  continuously by radiology nursing throughout the procedure under my direct supervision. FLUOROSCOPY: Radiation Exposure Index and estimated peak skin dose (PSD); Reference air kerma (RAK), 1.0 mGy. COMPLICATIONS: None immediate. PROCEDURE: Informed written consent was obtained from the patient and/or patient's representative after a discussion of the risks, benefits, and alternatives to treatment. Questions regarding the procedure were encouraged and answered. The RIGHT neck and chest were prepped with chlorhexidine  in a sterile fashion, and a sterile drape was applied covering the operative field. Maximum barrier sterile technique with sterile gowns and gloves were used for the procedure. A timeout was performed prior to the initiation of the procedure. After creating a small venotomy incision, a micropuncture kit was utilized to access the internal jugular vein. Real-time ultrasound guidance  was utilized for vascular access including the acquisition of a permanent ultrasound image documenting patency of the accessed vessel. The microwire was utilized to measure appropriate catheter length. A stiff Glidewire was advanced to the level of the IVC and the micropuncture sheath was exchanged for a peel-away sheath. A palindrome tunneled hemodialysis catheter measuring 23 cm from tip to cuff was tunneled in a retrograde fashion from the anterior chest wall to the venotomy incision. The catheter was then placed through the peel-away sheath with tips ultimately positioned within the superior aspect of the right atrium. Final catheter positioning was confirmed and documented with a spot radiographic image. The catheter aspirates and flushes normally. The catheter was flushed with appropriate volume heparin  dwells. The catheter exit site was secured with a 2-0 Ethilon retention suture. The venotomy incision was closed with Dermabond. Dressings were applied. The patient tolerated the procedure well without immediate post procedural complication. IMPRESSION: Successful placement of 23 cm tip to cuff tunneled hemodialysis catheter via the RIGHT internal jugular vein. The tip of the catheter is positioned within the proximal RIGHT atrium. The catheter is ready for immediate use. Thom Hall, MD [facility] Radiology Electronically Signed   By: Thom Hall M.D.   On: 08/12/2024 12:50    Assessment/Plan:   Principal Problem:   Acute renal failure Active Problems:   Ischemic cardiomyopathy   Atrial fibrillation (HCC)   Hypercoagulable state   Malaise   Back pain   Macrocytic anemia   Thrombocytopenia   PAD (peripheral artery disease)   Encephalopathy   Protein-calorie malnutrition, severe   Patient Summary: Connor Wilkerson is a 83 y.o. male with PMH of CAD with ICD, HLP, CKD stage III, paroxysmal a-fib on DOAC, hypertension, came here with chief  complaint of malaise and fatigue after 2 weeks of diarrhea, admitted here for encephalopathy, acute renal failure, and anemia.  Severe AKI on CKD 4 NAGMA - resolved  Encephalopathy - improving  Presented with serum creatinine 10.8, BUN 142.  Baseline serum creatinine ~2; differentials included secondary to GI losses, uremia, rhabdomyolysis, MM/AL. FeNa 5%.  CT A/P without obstruction.  Patient is status post [facility] placement on 10/11 and HD with 0 UF.  On exam today, patient is awake, alert, oriented to self and location. - HD today # 2  - Multiple myeloma panel pending - Renal precaution: Avoid nephrotoxic medications including NSAIDs and iodinated intravenous contrast exposure unless the latter is absolutely indicated.   Preferred narcotic agents for pain control are hydromorphone , fentanyl , and methadone. Morphine  should not be used.  Avoid Baclofen and avoid oral sodium phosphate  and magnesium citrate based laxatives / bowel preps.  Continue strict Input and Output monitoring. Will monitor the patient closely  with you and intervene or adjust therapy as indicated by changes in clinical status/labs  Keep MAP > 65 optimal renal perfusion  Normocytic anemia Suspecting anemia of chronic kidney disease/chronic disease Iron:137, B12:249, folate: normal. LDH 225.  DDX: MM, Anemia of chronic disease, Bone marrow decreased production  - Hemoglobin today 7.3 (7.2) - Transfuse if hemoglobin less than 7 - Follow-up multiple myeloma panel  Thrombocytopenia Smear shows normal platelet morphology.  Stable in 120s.  Hypertension HFrEF EF 55 -60% Last Echo 2023: EF:55-60.  SBP stable in 130s.  Continue to hold Entresto  and amlodipine . Exam pertinent for dry mucous membranes, no lower extremity edema.   History of thoracic and lumbar spondylosis I degenerative disc disease Patient has thoracic and lumbar spondylosis with degenerative disc disease and moderate central spinal stenosis at L3-L5. Imaging  shows:Right foraminal impingement at L3-L4 and L4-L5, Moderate left foraminal impingement at L2-L3 - Continue pain management tailored to his comorbidities (HFrEF, CKD, and AKI) - Avoid nephrotoxic or cardiodepressant medications - Monitor for changes in mobility, neurologic symptoms, or worsening pain - Consider physical therapy or consult spine/pain specialist if pain persists or worsens   #Falls/Debility #Poor Oral Intake  PT/OT recommended SNF.  TOC on board.  Consulted registered dietitian. -Ensure Plus high-protein twice daily with meals -Follow-up registered dietitian recommendation   #Atrial Fibrillation Chronic, paroxysmal. Low AF burden by ICD interrogation. Rhythm currently sinus. - Continue home Eliquis  2.5 mg twice daily  #Hyperlipidemia #Suspect rhabdomyolysis Presented with CK 1210, now elevated to 1719.  UA showed moderate with granular casts. Continue to hold Crestor .  - 1 L LR - Follow-up CK  #Depression Patient's wife reports that he takes up to 5 capsules of gabapentin  at home. Will renally dose medication - Continue Wellbutrin  150 XR  - Gabapentin  300 at bedtime  - Trazodone  50 mg at bedtime as needed   Diet: Normal IVF: None VTE: Apixaban  Wounds: Assessed by RN  Code: Full PT/OT recs: SNF TOC recs: SNF Family Update: Wife   Dispo: Anticipated discharge to Skilled nursing facility in 3 days pending medical management.   Signature: Toma Edwards, D.O.  Internal Medicine Resident, PGY-1 [facility]  6:44 AM, 08/13/2024   Please contact the on call pager after 5 pm and on weekends at [phone].

## 2024-08-13 NOTE — Progress Notes (Signed)
 [facility] Progress Note   Subjective:   Seen in dialysis - treatment #2. C/o pain in back again - notes this is not a new issue for him. Otherwise tolerating tx.  UOP yesterday.  Labs reflecting improvement from dialysis yesterday.   Objective Vitals:   08/13/24 0912 08/13/24 0927 08/13/24 0941 08/13/24 0942  BP: (!) 142/76 127/60  (!) 144/63  Pulse: 70 76 74 88  Resp: 18 13 (!) 21 (!) 22  Temp:      TempSrc:      SpO2: 98% 100% 100% 98%  Weight:       Physical Exam Gen: nontoxic but chronically ill appearing restless in bed Eyes:  EOMI ENT: poor dentition, M MM Neck: supple, no JVD CV: RRR on monitor Back: clear lungs GU: no foley Extr:  no edema Neuro:  AOx3 and self cone 2025, difficulty with details   Additional Objective Labs: Basic Metabolic Panel: Recent Labs  Lab 08/11/24 0313 08/12/24 0400 08/12/24 1722  NA 139 137 139  K 4.5 4.8 3.7  CL 106 102 101  CO2 18* 19* 24  GLUCOSE 130* 99 80  BUN 146* 142* 72*  CREATININE 10.90* 10.80* 6.64*  CALCIUM  8.3* 8.0* 8.3*  PHOS 7.7* 8.0* 4.3   Liver Function Tests: Recent Labs  Lab 08/10/24 0537 08/11/24 0313 08/12/24 0400 08/12/24 1722  AST 33  --   --   --   ALT 32  --   --   --   ALKPHOS 50  --   --   --   BILITOT 0.6  --   --   --   PROT 6.2*  --   --   --   ALBUMIN  3.7 3.1* 3.0* 3.1*   No results for input(s): LIPASE, AMYLASE in the last 168 hours. CBC: Recent Labs  Lab 08/10/24 0537 08/10/24 0545 08/11/24 0313 08/12/24 0400 08/13/24 0807  WBC 7.6  --  6.7 7.5 7.0  NEUTROABS 5.5  --   --   --   --   HGB 8.0*   < > 7.0* 7.2* 7.3*  HCT 24.4*   < > 20.5* 21.4* 21.9*  MCV 100.4*  --  95.8 96.8 97.8  PLT 134*  --  125* 119* 122*   < > = values in this interval not displayed.   Blood Culture    Component Value Date/Time   SDES  06/17/2023 1016    TOE TISSUE AMPUTATION Performed at [facility], [address], [place], [place] [address]     SPECREQUEST  06/17/2023 1016    NONE Performed at [facility], [address] [place], [place] [address]    CULT  06/17/2023 1016    No growth aerobically or anaerobically. Performed at [facility], [address], [place], [place] [address]    REPTSTATUS 06/22/2023 FINAL 06/17/2023 1016    Cardiac Enzymes: Recent Labs  Lab 08/10/24 0742 08/13/24 0807  CKTOTAL 1,210* 1,719*   CBG: Recent Labs  Lab 08/10/24 1338  GLUCAP 112*   Iron Studies:  Recent Labs    08/11/24 0313  IRON 137  TIBC 199*  FERRITIN 162   @[email or web address] @ Studies/Results: IR TUNNELED CENTRAL VENOUS CATH PLC W IMG Result Date: 08/12/2024 INDICATION: ESRD requiring dialysis initiation EXAM: TUNNELED CENTRAL VENOUS HEMODIALYSIS CATHETER PLACEMENT WITH ULTRASOUND AND FLUOROSCOPIC GUIDANCE MEDICATIONS: Ancef  2 gm IV. The antibiotic was given in an appropriate time interval prior to skin puncture. ANESTHESIA/SEDATION: Moderate (conscious)  sedation was employed during this procedure. A total of Versed  1.5 mg and Fentanyl  50 mcg was administered intravenously. Moderate Sedation Time: 16 minutes. The patient's level of consciousness and vital signs were monitored continuously by radiology nursing throughout the procedure under my direct supervision. FLUOROSCOPY: Radiation Exposure Index and estimated peak skin dose (PSD); Reference air kerma (RAK), 1.0 mGy. COMPLICATIONS: None immediate. PROCEDURE: Informed written consent was obtained from the patient and/or patient's representative after a discussion of the risks, benefits, and alternatives to treatment. Questions regarding the procedure were encouraged and answered. The RIGHT neck and chest were prepped with chlorhexidine  in a sterile fashion, and a sterile drape was applied covering the operative field. Maximum barrier sterile technique with sterile gowns and gloves were used for the procedure. A timeout was performed prior to the initiation of  the procedure. After creating a small venotomy incision, a micropuncture kit was utilized to access the internal jugular vein. Real-time ultrasound guidance was utilized for vascular access including the acquisition of a permanent ultrasound image documenting patency of the accessed vessel. The microwire was utilized to measure appropriate catheter length. A stiff Glidewire was advanced to the level of the IVC and the micropuncture sheath was exchanged for a peel-away sheath. A palindrome tunneled hemodialysis catheter measuring 23 cm from tip to cuff was tunneled in a retrograde fashion from the anterior chest wall to the venotomy incision. The catheter was then placed through the peel-away sheath with tips ultimately positioned within the superior aspect of the right atrium. Final catheter positioning was confirmed and documented with a spot radiographic image. The catheter aspirates and flushes normally. The catheter was flushed with appropriate volume heparin  dwells. The catheter exit site was secured with a 2-0 Ethilon retention suture. The venotomy incision was closed with Dermabond. Dressings were applied. The patient tolerated the procedure well without immediate post procedural complication. IMPRESSION: Successful placement of 23 cm tip to cuff tunneled hemodialysis catheter via the RIGHT internal jugular vein. The tip of the catheter is positioned within the proximal RIGHT atrium. The catheter is ready for immediate use. Thom Hall, MD [facility] Radiology Electronically Signed   By: Thom Hall M.D.   On: 08/12/2024 12:50   Medications:    acetaminophen   1,000 mg Oral Q6H WA   apixaban   2.5 mg Oral BID   Chlorhexidine  Gluconate Cloth  6 each Topical Q0600   diclofenac Sodium  4 g Topical QID   feeding supplement  237 mL Oral BID BM    HYDROmorphone  (DILAUDID ) injection  0.5 mg Intravenous Once   lidocaine   1 patch Transdermal Q24H    Assessment/Plan Connor Wilkerson is an 83 y.o. male with HFrEF (last TTE 2019 EF 35%), CAD, ICD, HTN, HL, A fib presenting with weakness and diarrhea x 2 weeks who is found to have severe AKI on CKD for which nephrology is consulted.    **Severe AKI on CKD 4: No recent labs.  Presented with 3 wk history of symptoms - unclear if this is prerenal state leading to AKI on CKD vs progressive CKD presenting with uremic symptoms.  UA dip +bld, 1+ protein but no RBC on microscopy (CK ~1000  holding statin), FeNa 5%, U/P 0.9, Myeloma w/u pending.  CT a/p no obstruction.  With mild thrombocytopenia smear checked - no schistocytes.  Holding ARB.   Has not had substantial improvement with hydration and poor appetite could be uremic symptoms - recommend dialysis.  HD cath and #1  treatment 10/11, #2 10/11 - tolerating fine so far.    Avoid extremes of BP and nephrotoxins.  Strict I/Os, daily weights.  Qshift PVR today.    **HFrEF:  last known EF 35%. appearing euvolemic after hydration.  Hold entresto  in setting of severe AKI.   No UF with HD today.   **HTN: holding entresto .  BP slightly higher but watch for now do not want hypotension   **NAGMA: in setting of severe AKI, resolved with HD.  **HL:  CK mildly elevated and with AKI hold statin - CK has gone from ~1200 to ~1700 today - further w/u as per primary   **thrombocytopenia:  mild, smear normal plt morphology - stable in 120s, CTM   **Anemia:  macrocytic, iron, B12 folate all ok. Pending SPEP, UPEP, SFLC - 1.2.  Hemoccult neg. Transfuse < 7.  Will start ESA.    **Falls/debility:  r/o uremia, other w/u per primary. Needs PT/OT.    Will follow reach out with concerns.    Manuelita Barters MD 08/13/2024, 9:56 AM  [facility] Pager: [phone]

## 2024-08-13 NOTE — Progress Notes (Signed)
 Brief Nutrition Note  RD consulted for assessment. Pt currently being followed by RD, but was not well oriented during initial assessment. Pt would benefit from in person assessment but RD currently working remotely. Will follow up with pt on Monday to further make recommendations for his current status.  Recommend continued Ensure supplementation and monitoring PO intake while pt receives HD treatments for AKI   Josette Glance, MS, RDN, LDN Clinical Dietitian I Please reach out via secure chat

## 2024-08-13 NOTE — Evaluation (Signed)
 Occupational Therapy Evaluation Patient Details Name: Connor Wilkerson MRN: [id number] DOB: 07/29/1941 Today's Date: 08/13/2024   History of Present Illness   Connor Wilkerson is a 83 y.o. male who presented to [facility] ED 08/10/24 with c/o weakness, difficulty walking, poor PO intake, and diarrhea x 2 weeks. Found to have acute renal failure with suspected oliguria. S/p Tunneled HD catheter placement and initial HD treatment 10/10. PMHx: HFrEF, CAD, ICD, CKD 3b, HTN, HLP, A fib, MI, PVD, chronic back pain, and depression.     Clinical Impressions Pt lives in a one level home with ramped entrance with his spouse and two dogs. Pt was primary caregiver for spouse who is in wheelchair and set to have neurosurgery on 08/17/24. PTA, Pt was independent with BADLs and spouse reports that he would occasionally use a cane for ambulation but has a history of falls. Pt with impaired cognitive status this session, requiring assistance from spouse to provide information for home living and PLOF occupational profile. Pt AxO only to self. Pt required Min A for functional mobility, +2 needed to advance gait for Pt safety and management of DME. Pt requiring up to Mod A for ADLs. Pt is primarily limited by pain, impaired safety awareness, decreased knowledge of DME, and decreased activity tolerance. OT to continue to follow Pt acutely to facilitate progress towards functional goals. Patient will benefit from continued inpatient follow up therapy, <3 hours/day.       If plan is discharge home, recommend the following:   A little help with walking and/or transfers;A lot of help with bathing/dressing/bathroom;Assistance with cooking/housework;Direct supervision/assist for medications management;Direct supervision/assist for financial management;Assist for transportation;Help with stairs or ramp for entrance;Supervision due to cognitive status     Functional Status Assessment   Patient has had a recent decline in their  functional status and demonstrates the ability to make significant improvements in function in a reasonable and predictable amount of time.     Equipment Recommendations   BSC/3in1     Recommendations for Other Services         Precautions/Restrictions   Precautions Precautions: Fall Recall of Precautions/Restrictions: Impaired Restrictions Weight Bearing Restrictions Per Provider Order: No     Mobility Bed Mobility Overal bed mobility: Needs Assistance Bed Mobility: Supine to Sit Rolling: Contact guard assist, Used rails         General bed mobility comments: Pt sat up on L side of bed with CGA and verbal cues to scoot towards EOB. Pt required increased time and use of bed rails.    Transfers Overall transfer level: Needs assistance Equipment used: Rolling walker (2 wheels) Transfers: Sit to/from Stand, Bed to chair/wheelchair/BSC Sit to Stand: Min assist     Step pivot transfers: Min assist, +2 physical assistance     General transfer comment: Pt stood from bed at lowest height with Min A. Pt did not require cues for hand placement. Pt powered up with Min A and took steps to the left towards recliner. Pt required Min A +2 when advancing gait for safety and management of RW as Pt presents with slight impulsivity and decreased knowledge of safe DME use.      Balance Overall balance assessment: Needs assistance Sitting-balance support: Bilateral upper extremity supported, Feet supported Sitting balance-Leahy Scale: Fair Sitting balance - Comments: Pt sat EOB with close supervision Postural control: Posterior lean Standing balance support: Bilateral upper extremity supported, During functional activity, Reliant on assistive device for balance Standing balance-Leahy Scale: Poor Standing balance  comment: Pt dependent on RW to maintain static and dynamic standing balance.                           ADL either performed or assessed with clinical  judgement   ADL Overall ADL's : Needs assistance/impaired Eating/Feeding: Set up   Grooming: Set up;Sitting   Upper Body Bathing: Set up   Lower Body Bathing: Moderate assistance   Upper Body Dressing : Set up   Lower Body Dressing: Moderate assistance   Toilet Transfer: Minimal assistance;+2 for physical assistance;Rolling walker (2 wheels)   Toileting- Clothing Manipulation and Hygiene: Moderate assistance       Functional mobility during ADLs: Minimal assistance;+2 for physical assistance;Rolling walker (2 wheels)       Vision   Vision Assessment?: No apparent visual deficits     Perception         Praxis         Pertinent Vitals/Pain Pain Assessment Pain Assessment: PAINAD Breathing: occasional labored breathing, short period of hyperventilation Negative Vocalization: occasional moan/groan, low speech, negative/disapproving quality Facial Expression: smiling or inexpressive Body Language: tense, distressed pacing, fidgeting Consolability: no need to console PAINAD Score: 3 Pain Location: abdomen Pain Descriptors / Indicators: Guarding, Aching Pain Intervention(s): Monitored during session     Extremity/Trunk Assessment Upper Extremity Assessment Upper Extremity Assessment: Overall WFL for tasks assessed   Lower Extremity Assessment Lower Extremity Assessment: Defer to PT evaluation   Cervical / Trunk Assessment Cervical / Trunk Assessment: Kyphotic   Communication Communication Communication: No apparent difficulties   Cognition Arousal: Alert Behavior During Therapy: Flat affect, Impulsive Cognition: Cognition impaired   Orientation impairments: Situation, Time, Place Awareness: Intellectual awareness intact, Online awareness impaired   Attention impairment (select first level of impairment): Divided attention, Alternating attention Executive functioning impairment (select all impairments): Problem solving, Reasoning OT - Cognition  Comments: Pt only oriented to self. Pt unable to provide accurate home living and PLOF information. Decreased responsiveness to instruction. Slight impulsivity noted with transfers, required explicit instruction to wait to stand until safe to do so.                 Following commands: Impaired Following commands impaired: Only follows one step commands consistently     Cueing  General Comments   Cueing Techniques: Verbal cues;Tactile cues;Visual cues  Pt with nausea throughout session, occassionally gagging. OT monitored and adjusted evaluation session in response to Pt VS and symptoms. Primary nurse made aware of nausea and present during session to address it. Pt denied dizziness and lightheadedness.   Exercises     Shoulder Instructions      Home Living Family/patient expects to be discharged to:: Private residence Living Arrangements: Spouse/significant other Available Help at Discharge: Available 24 hours/day Type of Home: House Home Access: Stairs to enter;Ramped entrance (ramp over stairs) Entrance Stairs-Number of Steps: 2 Entrance Stairs-Rails: Right;Left Home Layout: One level     Bathroom Shower/Tub: Chief Strategy Officer: Standard Bathroom Accessibility: Yes How Accessible: Accessible via walker Home Equipment: Rolling Walker (2 wheels);Rollator (4 wheels);[place] - single point   Additional Comments: Information gathered per wife at bedside. Pt has been primary caregiver for spouse who is also scheduled for neurosurgery on 10/15. Spouse in wheelchair and unable to provide assistance.      Prior Functioning/Environment Prior Level of Function : Independent/Modified Independent;History of Falls (last six months)  Mobility Comments: Wife reports Pt would sometimes use cane. Wife states that Pt sustained fall a few months ago that took him about 3 hours to get up from and has not been the same since. ADLs Comments: Wife reports Pt  managed BADLs independently.    OT Problem List: Decreased strength;Decreased activity tolerance;Impaired balance (sitting and/or standing);Decreased cognition;Decreased safety awareness;Decreased knowledge of use of DME or AE;Pain   OT Treatment/Interventions: Self-care/ADL training;Therapeutic exercise;DME and/or AE instruction;Energy conservation;Therapeutic activities;Cognitive remediation/compensation;Patient/family education;Balance training      OT Goals(Current goals can be found in the care plan section)   Acute Rehab OT Goals Patient Stated Goal: None stated this session OT Goal Formulation: With patient/family Time For Goal Achievement: 08/27/24 Potential to Achieve Goals: Good ADL Goals Pt Will Perform Grooming: standing;with set-up Pt Will Perform Lower Body Bathing: with contact guard assist;sitting/lateral leans Pt Will Perform Lower Body Dressing: with contact guard assist;sitting/lateral leans Pt Will Transfer to Toilet: with contact guard assist;ambulating Additional ADL Goal #1: Pt will engage in bed mobility to sit EOB with supervision   OT Frequency:  Min 2X/week    Co-evaluation              AM-PAC OT 6 Clicks Daily Activity     Outcome Measure Help from another person eating meals?: A Little Help from another person taking care of personal grooming?: A Little Help from another person toileting, which includes using toliet, bedpan, or urinal?: A Lot Help from another person bathing (including washing, rinsing, drying)?: A Lot Help from another person to put on and taking off regular upper body clothing?: A Little Help from another person to put on and taking off regular lower body clothing?: A Lot 6 Click Score: 15   End of Session Equipment Utilized During Treatment: Gait belt;Rolling walker (2 wheels) Nurse Communication: Mobility status  Activity Tolerance: Patient limited by lethargy Patient left: in chair;with call bell/phone within  reach;with chair alarm set;with family/visitor present  OT Visit Diagnosis: Unsteadiness on feet (R26.81);Muscle weakness (generalized) (M62.81);History of falling (Z91.81);Other symptoms and signs involving cognitive function;Pain Pain - part of body:  (abdomen)                Time: 8669-8643 OT Time Calculation (min): 26 min Charges:  OT General Charges $OT Visit: 1 Visit OT Evaluation $OT Eval Low Complexity: 1 Low OT Treatments $Therapeutic Activity: 8-22 mins  Maurilio CROME, OTR/L.  [facility]  Office: [phone]   Maurilio PARAS Genea Rheaume 08/13/2024, 2:32 PM

## 2024-08-13 NOTE — Procedures (Signed)
 HD Note:  Some information was entered later than the data was gathered due to patient care needs. The stated time with the data is accurate.  Received patient in bed to unit.   Alert and oriented to self, location, and situation.    Informed consent signed and in chart.   Access used: Right upper chest HD tunneled catheter Access issues: None  Patient was restless during entire treatment. Patient attempted to get out of bed constantly needing redirection. Patient choose to end treatment prior to the prescribed time.  AMA signed.  TX duration: 1.86 hours  Alert, without acute distress.  Patient received 79 ml of NS during the treatment.  Hand-off given to patient's nurse.   Transported back to the room by this Clinical research associate and another nurse.  Patient's wife was in the room in the recliner.  Patient calmed as soon as his wife's recliner was placed next to his bed and they were able to hold hands.  Yukiko Minnich L. Lenon, RN [facility].

## 2024-08-14 DIAGNOSIS — N179 Acute kidney failure, unspecified: Secondary | ICD-10-CM | POA: Diagnosis not present

## 2024-08-14 DIAGNOSIS — N184 Chronic kidney disease, stage 4 (severe): Secondary | ICD-10-CM | POA: Diagnosis not present

## 2024-08-14 DIAGNOSIS — D638 Anemia in other chronic diseases classified elsewhere: Secondary | ICD-10-CM | POA: Diagnosis present

## 2024-08-14 DIAGNOSIS — M549 Dorsalgia, unspecified: Secondary | ICD-10-CM

## 2024-08-14 DIAGNOSIS — Z992 Dependence on renal dialysis: Secondary | ICD-10-CM | POA: Diagnosis not present

## 2024-08-14 LAB — CBC
HCT: 20.9 % — ABNORMAL LOW (ref 39.0–52.0)
Hemoglobin: 7 g/dL — ABNORMAL LOW (ref 13.0–17.0)
MCH: 32.9 pg (ref 26.0–34.0)
MCHC: 33.5 g/dL (ref 30.0–36.0)
MCV: 98.1 fL (ref 80.0–100.0)
Platelets: 111 K/uL — ABNORMAL LOW (ref 150–400)
RBC: 2.13 MIL/uL — ABNORMAL LOW (ref 4.22–5.81)
RDW: 12.3 % (ref 11.5–15.5)
WBC: 6.5 K/uL (ref 4.0–10.5)
nRBC: 0 % (ref 0.0–0.2)

## 2024-08-14 LAB — RENAL FUNCTION PANEL
Albumin: 3 g/dL — ABNORMAL LOW (ref 3.5–5.0)
Anion gap: 14 (ref 5–15)
BUN: 66 mg/dL — ABNORMAL HIGH (ref 8–23)
CO2: 23 mmol/L (ref 22–32)
Calcium: 7.9 mg/dL — ABNORMAL LOW (ref 8.9–10.3)
Chloride: 98 mmol/L (ref 98–111)
Creatinine, Ser: 6.63 mg/dL — ABNORMAL HIGH (ref 0.61–1.24)
GFR, Estimated: 8 mL/min — ABNORMAL LOW (ref >60)
Glucose, Bld: 147 mg/dL — ABNORMAL HIGH (ref 70–99)
Phosphorus: 6.7 mg/dL — ABNORMAL HIGH (ref 2.5–4.6)
Potassium: 3.9 mmol/L (ref 3.5–5.1)
Sodium: 135 mmol/L (ref 135–145)

## 2024-08-14 LAB — CK: Total CK: 1115 U/L — ABNORMAL HIGH (ref 49–397)

## 2024-08-14 MED ORDER — ACETAMINOPHEN 500 MG PO TABS
1000.0000 mg | ORAL_TABLET | Freq: Four times a day (QID) | ORAL | Status: DC
  Administered 2024-08-14 – 2024-08-17 (×8): 1000 mg via ORAL
  Filled 2024-08-14 (×10): qty 2

## 2024-08-14 MED ORDER — HYDROMORPHONE HCL 1 MG/ML IJ SOLN
0.5000 mg | Freq: Four times a day (QID) | INTRAMUSCULAR | Status: DC | PRN
  Administered 2024-08-16: 0.5 mg via INTRAVENOUS
  Filled 2024-08-14: qty 0.5

## 2024-08-14 NOTE — TOC Progression Note (Signed)
 Transition of Care [facility]) - Progression Note    Patient Details  Name: Connor Wilkerson MRN: [id number] Date of Birth: [birth year]  [facility] CM/SW Contact  Cena Ligas, [place] Phone Number: 08/14/2024, 12:35 PM  Clinical Narrative:     CSW spoke to patients wife in room and daughter [name] over the phone. CSW gave bed offers and answered all questions. Pts wife and daughter state they will discuss and have decision tomorrow about SNF choice.   Expected Discharge Plan: Skilled Nursing Facility Barriers to Discharge: Continued Medical Work up, SNF Pending bed offer               Expected Discharge Plan and Services In-house Referral: Clinical Social Work   Post Acute Care Choice: Skilled Nursing Facility Living arrangements for the past 2 months: Single Family Home                                       Social Drivers of Health (SDOH) Interventions SDOH Screenings   Food Insecurity: No Food Insecurity (08/10/2024)  Housing: Low Risk  (08/10/2024)  Transportation Needs: No Transportation Needs (08/10/2024)  Utilities: Not At Risk (08/10/2024)  Social Connections: Patient Declined (08/10/2024)  Tobacco Use: Medium Risk (08/10/2024)    Readmission Risk Interventions     No data to display         Cena Ligas, LCSW Clinical Social Worker

## 2024-08-14 NOTE — Progress Notes (Signed)
 HD#4 SUBJECTIVE:  Patient Summary: Connor Wilkerson is a 83 y.o. male with PMH of CAD with ICD, HLP, CKD stage III, paroxysmal a-fib on DOAC, hypertension, came here with chief complaint of malaise and fatigue after 2 weeks of diarrhea, admitted here for encephalopathy, acute renal failure, and anemia.   Overnight Events: NAEON  Interim History:  The patient was comfortably sleeping in bed with his wife present at the bedside. Upon waking, he was smiling and in good spirits. He denied any chest pain or shortness of breath. He reported significant improvement in his back pain. The patient had normal bowel movements and urination.  He was oriented to himself and the environment but was unable to recognize his wife, mistakenly identifying her as his mother. However, his attention and level of confusion showed improvement compared to yesterday. The patient was cooperative and communicated effectively with the team today.  OBJECTIVE:  Vital Signs: Vitals:   08/13/24 1114 08/13/24 1131 08/13/24 1500 08/13/24 2025  BP: 114/70 (!) 134/93 (!) 140/67 134/65  Pulse: 90 90 90 78  Resp:  18 19   Temp: 97.9 F (36.6 C) 98.3 F (36.8 C) 98.2 F (36.8 C) 98.1 F (36.7 C)  TempSrc: Oral  Oral Oral  SpO2: 100% 100% 100% 100%  Weight: 68.6 kg      Supplemental O2: Room Air SpO2: 100 % O2 Flow Rate (L/min): 2 L/min  Filed Weights   08/12/24 1541 08/13/24 0857 08/13/24 1114  Weight: 64.4 kg 69.5 kg 68.6 kg     Intake/Output Summary (Last 24 hours) at 08/14/2024 0633 Last data filed at 08/13/2024 1950 Gross per 24 hour  Intake 1603.04 ml  Output 100 ml  Net 1503.04 ml   Net IO Since Admission: 2,538.14 mL [08/14/24 0633]  Physical Exam: Physical Exam General: The patient is lying in bed, appears ill, and is cachectic. Cardiovascular: Heart rate and rhythm are normal, with no murmurs auscultated. Pulmonary: Breathing comfortably and without effort. Lateral and anterior lung fields  clear, with no wheezing or crackles noted. Abdomen: Soft to palpation, without tenderness, cachectic. Musculoskeletal: No lower extremity edema. The patient moves extremities spontaneously. Neurological: The patient is oriented to self and place but is not oriented to the date or his wife. He follows commands and communicates effectively. Surgical Site: Pooled blood are noted around the [facility] site, trapped under the clear dressing. No active bleeding or discharge observed.   Active Line/Drains/Airways     Name Placement date Placement time Site Days   Peripheral IV 08/10/24 20 G 1 Anterior;Right Forearm 08/10/24  0529  Forearm  4   Hemodialysis Catheter Right Internal jugular Double lumen Permanent (Tunneled) 08/12/24  1110  Internal jugular  2            Pertinent labs and imaging:      Latest Ref Rng & Units 08/14/2024    2:18 AM 08/13/2024    8:07 AM 08/12/2024    4:00 AM  CBC  WBC 4.0 - 10.5 K/uL 6.5  7.0  7.5   Hemoglobin 13.0 - 17.0 g/dL 7.0  7.3  7.2   Hematocrit 39.0 - 52.0 % 20.9  21.9  21.4   Platelets 150 - 400 K/uL 111  122  119        Latest Ref Rng & Units 08/13/2024    3:28 PM 08/13/2024   12:07 PM 08/12/2024    5:22 PM  CMP  Glucose 70 - 99 mg/dL 896  897  80  BUN 8 - 23 mg/dL 52  49  72   Creatinine 0.61 - 1.24 mg/dL 4.68  5.09  3.35   Sodium 135 - 145 mmol/L 137  136  139   Potassium 3.5 - 5.1 mmol/L 4.1  3.8  3.7   Chloride 98 - 111 mmol/L 99  99  101   CO2 22 - 32 mmol/L 26  23  24    Calcium  8.9 - 10.3 mg/dL 8.4  8.4  8.3     No results found.  ASSESSMENT/PLAN:  Assessment: Principal Problem:   Acute renal failure Active Problems:   Ischemic cardiomyopathy   Atrial fibrillation (HCC)   Hypercoagulable state   Malaise   Back pain   Macrocytic anemia   Thrombocytopenia   PAD (peripheral artery disease)   Encephalopathy   Protein-calorie malnutrition, severe   ESRD on dialysis [facility])   Plan: Mr. Osten is a 83 year old gentleman  with past medical history of CAD, CAD with ICD, HLP, CKD stage III, paroxysmal a-fib on DOAC, hypertension, came here with chief complaint of malaise and fatigue after 2 weeks of diarrhea, admitted here for elevated creatinine (12).   #AKI on CKD #Confusion #Malaise Cr: 5.3 , BUN:52 Connor Wilkerson is an 83 year old male with HFrEF (EF 35% on last TTE 2019), CAD, ICD, HTN, hyperlipidemia, and atrial fibrillation, presenting with weakness and diarrhea x 2 weeks, found to have severe AKI on CKD stage IV. Oliguria suspected (output ~100 mL/24h).  Nephrology: The patient was transported to the unit, alert and oriented, signed informed consent, tolerated treatment well, but remained restless and in pain throughout, receiving 5mg  of oxycodone , completing 1hr of treatment Plan: - Likely pre-renal AKI vs progressive CKD with uremic symptoms. - Had 2 episodes of hemodialysis (HD cath and #1 treatment 10/11, #2 10/11 ) - Strict I/Os, daily weights, monitor PVR each shift. - Avoid extremes of BP and nephrotoxins. - Hold RAAS blockers/Entresto  due to AKI and HFrEF. - Monitor labs: electrolytes, kidney function, FeNa, urine studies, myeloma work-up pending. - CK: 1200 ? 1700 ? 1200; statin held.  #Normocytic Anemia HB: 7 (down from 7.2), MCV:95.  Iron:137, B12:249, folate: normal.DDX: MM, Anemia of chronic disease, Bone marrow decreased production - SPEP, UPEP, SFLC pending.- - Hb trending down;Transfuse if Hb <7 g/dL.   #Back Pain Patient has thoracic and lumbar spondylosis with degenerative disc disease and moderate central spinal stenosis at L3-L5. Imaging shows:Right foraminal impingement at L3-L4 and L4-L5, Moderate left foraminal impingement at L2-L3 - Continue pain management tailored to his comorbidities (HFrEF, CKD, and AKI) - Diclofenac gel, lidocaine  patch, Dilaudid  IV as needed - Gabapentin  300 twice daily and Motrin 150 4 times daily for pain and anxiety management - Avoid  nephrotoxic or cardiodepressant medications - Monitor for changes in mobility, neurologic symptoms, or worsening pain - Consider physical therapy or consult spine/pain specialist if pain persists or worsens  #Falls/Debility #Poor Oral Intake  Consider uremia as contributing factor. Primary team to continue work-up.  #Recovered HFrEF Appears euvolemic after overnight hydration. Last Echo 2023: EF:55-60 - Entresto  held in setting of severe AKI.  #HTN BP slightly elevated; monitor closely. - Avoid hypotension; continue holding Entresto .   #Atrial Fibrillation (HCC) Chronic, paroxysmal. Low AF burden by ICD interrogation. Rhythm currently sinus. - Resume outpatient Eliquis  2.5 mg BID.  #NAGMA Improved, bicarb: 19, AG:16  Present in context of severe AKI. - Improving with bicarbonate infusion, continue until dialysis.  #Hyperkalemia - Resolved following Lokelma administration.  #Hyperlipidemia -  CK mildly elevated; statin held due to AKI.  #Thrombocytopenia Mild, stable in 120s. Smear shows normal platelet morphology. Continue monitoring.    Best Practice: Diet: Regular diet IVF: Fluids: sodium bicarbonate infusion  VTE: apixaban  (ELIQUIS ) tablet 2.5 mg Start: 08/10/24 1000 Code: Full, but wouldn't want life-sustaining measures if he couldn't be liberated from ventilator    Disposition planning: Therapy Recs: None,  Family Contact: Wife DISPO: Anticipated discharge in 2 days to SNF pending medical IMPROVEMENT Signature:  Shawnika Pepin [facility]ad[facility]  6:33 AM, 08/14/2024  On Call pager [phone]

## 2024-08-14 NOTE — Progress Notes (Signed)
 [facility] Progress Note   Subjective:   Seen in room.  Terminated dialysis early yesterday due to back pain which is a chronic issue for him exacerbated by the bed. Wife bedside and says the turnaround has been dramatic after 2 dialysis treatments.  He's back to [employer].  He ate a hearty breakfast today and ordered seconds - 1st good oral intake in some time.  Objective Vitals:   08/13/24 1131 08/13/24 1500 08/13/24 2025 08/14/24 0739  BP: (!) 134/93 (!) 140/67 134/65 (!) 144/66  Pulse: 90 90 78 69  Resp: 18 19    Temp: 98.3 F (36.8 C) 98.2 F (36.8 C) 98.1 F (36.7 C) 97.9 F (36.6 C)  TempSrc:  Oral Oral Oral  SpO2: 100% 100% 100% 99%  Weight:       Physical Exam Gen: nontoxic but chronically ill appearing awake and alert Eyes:  EOMI ENT: poor dentition, M MM Neck: supple, no JVD CV: RRR on monitor Back: clear lungs GU: no foley Extr:  no edema Neuro:  AOx3 and self cone 2025, difficulty with details still   Additional Objective Labs: Basic Metabolic Panel: Recent Labs  Lab 08/12/24 1722 08/13/24 1207 08/13/24 1528  NA 139 136 137  K 3.7 3.8 4.1  CL 101 99 99  CO2 24 23 26   GLUCOSE 80 102* 103*  BUN 72* 49* 52*  CREATININE 6.64* 4.90* 5.31*  CALCIUM  8.3* 8.4* 8.4*  PHOS 4.3 3.9 5.4*   Liver Function Tests: Recent Labs  Lab 08/10/24 0537 08/11/24 0313 08/12/24 1722 08/13/24 1207 08/13/24 1528  AST 33  --   --   --   --   ALT 32  --   --   --   --   ALKPHOS 50  --   --   --   --   BILITOT 0.6  --   --   --   --   PROT 6.2*  --   --   --   --   ALBUMIN  3.7   < > 3.1* 3.3* 3.3*   < > = values in this interval not displayed.   No results for input(s): LIPASE, AMYLASE in the last 168 hours. CBC: Recent Labs  Lab 08/10/24 0537 08/10/24 0545 08/11/24 0313 08/12/24 0400 08/13/24 0807 08/14/24 0218  WBC 7.6  --  6.7 7.5 7.0 6.5  NEUTROABS 5.5  --   --   --   --   --   HGB 8.0*   < > 7.0* 7.2* 7.3* 7.0*  HCT 24.4*    < > 20.5* 21.4* 21.9* 20.9*  MCV 100.4*  --  95.8 96.8 97.8 98.1  PLT 134*  --  125* 119* 122* 111*   < > = values in this interval not displayed.   Blood Culture    Component Value Date/Time   SDES  06/17/2023 1016    TOE TISSUE AMPUTATION Performed at [facility], [address], [place], [place] [address]    SPECREQUEST  06/17/2023 1016    NONE Performed at [facility], [address], [place], [place] [address]    CULT  06/17/2023 1016    No growth aerobically or anaerobically. Performed at [facility], [address] [place], [place] [address]    REPTSTATUS 06/22/2023 FINAL 06/17/2023 1016    Cardiac Enzymes: Recent Labs  Lab 08/10/24 0742 08/13/24 0807 08/14/24 0218  CKTOTAL 1,210* 1,719* 1,115*   CBG: Recent Labs  Lab 08/10/24 1338  GLUCAP 112*   Iron Studies:  No results for input(s): IRON, TIBC, TRANSFERRIN, FERRITIN in the last 72 hours.  @lablastinr3 @ Studies/Results: IR TUNNELED CENTRAL VENOUS CATH PLC W IMG Result Date: 08/12/2024 INDICATION: ESRD requiring dialysis initiation EXAM: TUNNELED CENTRAL VENOUS HEMODIALYSIS CATHETER PLACEMENT WITH ULTRASOUND AND FLUOROSCOPIC GUIDANCE MEDICATIONS: Ancef  2 gm IV. The antibiotic was given in an appropriate time interval prior to skin puncture. ANESTHESIA/SEDATION: Moderate (conscious) sedation was employed during this procedure. A total of Versed  1.5 mg and Fentanyl  50 mcg was administered intravenously. Moderate Sedation Time: 16 minutes. The patient's level of consciousness and vital signs were monitored continuously by radiology nursing throughout the procedure under my direct supervision. FLUOROSCOPY: Radiation Exposure Index and estimated peak skin dose (PSD); Reference air kerma (RAK), 1.0 mGy. COMPLICATIONS: None immediate. PROCEDURE: Informed written consent was obtained from the patient and/or patient's representative after a discussion of the risks,  benefits, and alternatives to treatment. Questions regarding the procedure were encouraged and answered. The RIGHT neck and chest were prepped with chlorhexidine  in a sterile fashion, and a sterile drape was applied covering the operative field. Maximum barrier sterile technique with sterile gowns and gloves were used for the procedure. A timeout was performed prior to the initiation of the procedure. After creating a small venotomy incision, a micropuncture kit was utilized to access the internal jugular vein. Real-time ultrasound guidance was utilized for vascular access including the acquisition of a permanent ultrasound image documenting patency of the accessed vessel. The microwire was utilized to measure appropriate catheter length. A stiff Glidewire was advanced to the level of the IVC and the micropuncture sheath was exchanged for a peel-away sheath. A palindrome tunneled hemodialysis catheter measuring 23 cm from tip to cuff was tunneled in a retrograde fashion from the anterior chest wall to the venotomy incision. The catheter was then placed through the peel-away sheath with tips ultimately positioned within the superior aspect of the right atrium. Final catheter positioning was confirmed and documented with a spot radiographic image. The catheter aspirates and flushes normally. The catheter was flushed with appropriate volume heparin  dwells. The catheter exit site was secured with a 2-0 Ethilon retention suture. The venotomy incision was closed with Dermabond. Dressings were applied. The patient tolerated the procedure well without immediate post procedural complication. IMPRESSION: Successful placement of 23 cm tip to cuff tunneled hemodialysis catheter via the RIGHT internal jugular vein. The tip of the catheter is positioned within the proximal RIGHT atrium. The catheter is ready for immediate use. Thom Hall, MD [facility] Radiology Electronically  Signed   By: Thom Hall M.D.   On: 08/12/2024 12:50   Medications:    acetaminophen   1,000 mg Oral Q6H WA   apixaban   2.5 mg Oral BID   buPROPion   150 mg Oral Daily   Chlorhexidine  Gluconate Cloth  6 each Topical Q0600   darbepoetin (ARANESP) injection - DIALYSIS  100 mcg Subcutaneous Q Sat-1800   diclofenac Sodium  4 g Topical QID   feeding supplement  237 mL Oral BID BM   gabapentin   300 mg Oral QHS    HYDROmorphone  (DILAUDID ) injection  0.5 mg Intravenous Once   lidocaine   1 patch Transdermal Q24H   Assessment/Plan Connor Wilkerson is an 83 y.o. male with HFrEF (last TTE 2019 EF 35%), CAD, ICD, HTN, HL, A fib presenting with weakness and diarrhea x 2 weeks who is found to have severe AKI on CKD for which  nephrology is consulted.    **Severe AKI on CKD 4: No recent labs - 10/2023 Cr 2.1 which had been baseline.  Presented with 3 wk history of symptoms - unclear if this is prerenal state leading to AKI on CKD vs progressive CKD presenting with uremic symptoms.  UA dip +bld, 1+ protein but no RBC on microscopy (CK ~1000  holding statin), FeNa 5%, U/P 0.9, Myeloma w/u SFLC ok but SPEP and UPEP pending.  CT a/p no obstruction.  With mild thrombocytopenia smear checked - no schistocytes.  Holding ARB.   Did not have substantial improvement with hydration and with likely uremic symptoms - recommend dialysis.  HD cath and #1 treatment 10/11, #2 10/11 - terminated early due to back pain.  Has had remarkable improvement supporting his presentation was uremia.  For now he remains oliguric and will plan for next dialysis tomorrow - will ask primary team to work on back pain and he requests to try to come in a chair.  Will need to start outpt CLIP Monday.    Avoid extremes of BP and nephrotoxins.  Strict I/Os, daily weights.    **Back pain: chronic just worse in bed and limiting dialysis per above; request primary team address.  Will do HD in chair tomorrow.   **HFrEF:  last known EF 35%. appearing  euvolemic after hydration.  Hold entresto  in setting of severe AKI.   No UF with HD for now.   **HTN: holding entresto .  BP ok currently.    **HL:  CK mildly elevated and with AKI holding statin.   **thrombocytopenia:  mild, smear normal plt morphology - stable in 110-120s, CTM   **Anemia:  macrocytic, iron, B12 folate all ok. Pending SPEP, UPEP, SFLC - 1.2.  Hemoccult neg. Transfuse < 7.  Started ESA.    **Falls/debility: likely uremia contributing, other w/u per primary. Needs PT/OT.    Will follow reach out with concerns.    Manuelita Barters MD 08/14/2024, 9:50 AM  [facility] Pager: [phone]

## 2024-08-15 LAB — RENAL FUNCTION PANEL
Albumin: 2.9 g/dL — ABNORMAL LOW (ref 3.5–5.0)
Anion gap: 15 (ref 5–15)
BUN: 78 mg/dL — ABNORMAL HIGH (ref 8–23)
CO2: 21 mmol/L — ABNORMAL LOW (ref 22–32)
Calcium: 7.9 mg/dL — ABNORMAL LOW (ref 8.9–10.3)
Chloride: 99 mmol/L (ref 98–111)
Creatinine, Ser: 6.95 mg/dL — ABNORMAL HIGH (ref 0.61–1.24)
GFR, Estimated: 7 mL/min — ABNORMAL LOW (ref >60)
Glucose, Bld: 115 mg/dL — ABNORMAL HIGH (ref 70–99)
Phosphorus: 7.3 mg/dL — ABNORMAL HIGH (ref 2.5–4.6)
Potassium: 4.1 mmol/L (ref 3.5–5.1)
Sodium: 135 mmol/L (ref 135–145)

## 2024-08-15 LAB — CBC
HCT: 19.9 % — ABNORMAL LOW (ref 39.0–52.0)
Hemoglobin: 6.7 g/dL — CL (ref 13.0–17.0)
MCH: 33.2 pg (ref 26.0–34.0)
MCHC: 33.7 g/dL (ref 30.0–36.0)
MCV: 98.5 fL (ref 80.0–100.0)
Platelets: 123 K/uL — ABNORMAL LOW (ref 150–400)
RBC: 2.02 MIL/uL — ABNORMAL LOW (ref 4.22–5.81)
RDW: 12.2 % (ref 11.5–15.5)
WBC: 7 K/uL (ref 4.0–10.5)
nRBC: 0 % (ref 0.0–0.2)

## 2024-08-15 LAB — PROTEIN ELECTROPHORESIS, SERUM
A/G Ratio: 1.9 — ABNORMAL HIGH (ref 0.7–1.7)
Albumin ELP: 3.3 g/dL (ref 2.9–4.4)
Alpha-1-Globulin: 0.1 g/dL (ref 0.0–0.4)
Alpha-2-Globulin: 0.6 g/dL (ref 0.4–1.0)
Beta Globulin: 0.5 g/dL — ABNORMAL LOW (ref 0.7–1.3)
Gamma Globulin: 0.4 g/dL (ref 0.4–1.8)
Globulin, Total: 1.7 g/dL — ABNORMAL LOW (ref 2.2–3.9)
Total Protein ELP: 5 g/dL — ABNORMAL LOW (ref 6.0–8.5)

## 2024-08-15 LAB — HEMOGLOBIN AND HEMATOCRIT, BLOOD
HCT: 21.7 % — ABNORMAL LOW (ref 39.0–52.0)
Hemoglobin: 7.3 g/dL — ABNORMAL LOW (ref 13.0–17.0)

## 2024-08-15 LAB — CK: Total CK: 523 U/L — ABNORMAL HIGH (ref 49–397)

## 2024-08-15 LAB — PREPARE RBC (CROSSMATCH)

## 2024-08-15 MED ORDER — SODIUM CHLORIDE 0.9% IV SOLUTION: Freq: Once | INTRAVENOUS | Status: AC

## 2024-08-15 MED ORDER — SENNOSIDES-DOCUSATE SODIUM 8.6-50 MG PO TABS
2.0000 | ORAL_TABLET | Freq: Every day | ORAL | Status: DC
  Administered 2024-08-15 – 2024-08-16 (×2): 2 via ORAL
  Filled 2024-08-15 (×2): qty 2

## 2024-08-15 NOTE — Care Management Important Message (Signed)
 Important Message  Patient Details  Name: Connor Wilkerson MRN: [id number] Date of Birth: [birth year]   Important Message Given:  Yes - Medicare IM     Jon Cruel 08/15/2024, 4:46 PM

## 2024-08-15 NOTE — Progress Notes (Signed)
   08/15/24 0943  Spiritual Encounters  Type of Visit Initial  Care provided to: Pt and family  Referral source Clinical staff  Reason for visit Advance directives  OnCall Visit No  Spiritual Framework  Presenting Themes Impactful experiences and emotions  Community/Connection Family  Patient Stress Factors Health changes  Family Stress Factors Health changes  Interventions  Spiritual Care Interventions Made Compassionate presence;Established relationship of care and support  Intervention Outcomes  Outcomes Awareness of health;Awareness of support;Reduced anxiety   Chaplain visited Pt to discuss advance directive (AD). Pt was in bed, and spouse was present at bedside. Spouse share that she is scheduled for surgery on Wednesday and informed that if the medical team needs to reach her, they may need to try  multiple times as she does not always answer the phone on the first attempt.  Chaplain left  a copy of the AD for the Pt and spouse to review together. Both stated they would like to read over it before completing the document.

## 2024-08-15 NOTE — Progress Notes (Addendum)
 Contacted by Nephrologist regarding need to for out-pt HD CLIP.   Pt will be going to [facility], therefore will need an HD clinic in [place], Navigator met bedside with pt and visitor to inform that navigator will be submitted this referral, pt agitated at this time about his food not being there.   Pt declined to answer questions regarding personal info at this time. Navigator will continue with CLIP to clinic close to SNF and update when applicable.   Lavanda Matalie Romberger Dialysis Navigator [phone]  Addendum 1:26pm Contacted [facility] admissions, the [facility] clinic has no space available. Attempted to divert, they still declined. Next closest clinic would be in [place], unsure if snf would be willing to transport. Care team including SW/CM have been updated at this time. Will continue to assist.   Addendum 4:16pm Notified by SW that pt will likely d/c to [facility] if a HD clinic can be found that will transport MWF, 2nd shift. Navigator has completed a referral to [facility] admissions at this time. Admissions notified it will need to be the closest clinic to snf that can do mwf. Awaiting clinic determination and financial/MD clearance. Will provide update when feasible.

## 2024-08-15 NOTE — Progress Notes (Addendum)
 Critical lab value-hemoglobin 6.7 MD notified

## 2024-08-15 NOTE — Progress Notes (Signed)
 HD#5 SUBJECTIVE:  Patient Summary: : Connor Wilkerson is a 83 year old gentleman with past medical history of CAD, CAD with ICD, HLP, CKD stage III, paroxysmal a-fib on DOAC, hypertension, came here with chief complaint of malaise and fatigue after 2 weeks of diarrhea, admitted here for AKI.    Overnight Events: NAEON  Interim History: The patient was resting comfortably in bed with his wife at the bedside. Upon waking, he appeared happy and in good spirits. He denied chest pain or shortness of breath and reported significant improvement in his back pain. Bowel movements and urination were normal.  He was oriented to person, place, and situation, and his attention and level of confusion had improved compared to yesterday. The patient was cooperative and communicated effectively with the care team.  OBJECTIVE:  Vital Signs: Vitals:   08/14/24 2007 08/15/24 0727 08/15/24 1300 08/15/24 1315  BP: (!) 93/48 (!) 155/83 117/60 116/62  Pulse: 80 63 86 84  Resp: 16 16 16 16   Temp: 99.3 F (37.4 C) 98.2 F (36.8 C) 98.1 F (36.7 C) 98 F (36.7 C)  TempSrc:  Oral Oral Oral  SpO2: 94% 100% 100% 100%  Weight:       Supplemental O2: Room Air SpO2: 100 % O2 Flow Rate (L/min): 2 L/min  Filed Weights   08/12/24 1541 08/13/24 0857 08/13/24 1114  Weight: 64.4 kg 69.5 kg 68.6 kg     Intake/Output Summary (Last 24 hours) at 08/15/2024 1335 Last data filed at 08/15/2024 0858 Gross per 24 hour  Intake 120 ml  Output 495 ml  Net -375 ml   Net IO Since Admission: 2,403.14 mL [08/15/24 1335]  Physical Exam: Physical Exam General: The patient is lying in bed, appears ill, and is cachectic. Cardiovascular: Heart rate and rhythm are normal, with no murmurs auscultated. Pulmonary: Breathing comfortably and without effort. Lateral and anterior lung fields clear, with no wheezing or crackles noted. Abdomen: Soft to palpation, without tenderness, cachectic. Musculoskeletal: No lower extremity edema.  The patient moves extremities spontaneously. Neurological: The patient is oriented to self, date and place, . He follows commands and communicates effectively. Surgical Site: Pooled blood are noted around the [facility] site, trapped under the clear dressing. No active bleeding or discharge observed.  Patient Lines/Drains/Airways Status     Active Line/Drains/Airways     Name Placement date Placement time Site Days   Peripheral IV 08/10/24 20 G 1 Anterior;Right Forearm 08/10/24  0529  Forearm  5   Hemodialysis Catheter Right Internal jugular Double lumen Permanent (Tunneled) 08/12/24  1110  Internal jugular  3            Pertinent labs and imaging:      Latest Ref Rng & Units 08/15/2024    6:15 AM 08/14/2024    2:18 AM 08/13/2024    8:07 AM  CBC  WBC 4.0 - 10.5 K/uL 7.0  6.5  7.0   Hemoglobin 13.0 - 17.0 g/dL 6.7  7.0  7.3   Hematocrit 39.0 - 52.0 % 19.9  20.9  21.9   Platelets 150 - 400 K/uL 123  111  122        Latest Ref Rng & Units 08/15/2024    6:15 AM 08/14/2024    6:46 PM 08/13/2024    3:28 PM  CMP  Glucose 70 - 99 mg/dL 884  852  896   BUN 8 - 23 mg/dL 78  66  52   Creatinine 0.61 - 1.24 mg/dL 3.04  3.36  5.31   Sodium 135 - 145 mmol/L 135  135  137   Potassium 3.5 - 5.1 mmol/L 4.1  3.9  4.1   Chloride 98 - 111 mmol/L 99  98  99   CO2 22 - 32 mmol/L 21  23  26    Calcium  8.9 - 10.3 mg/dL 7.9  7.9  8.4     No results found.  ASSESSMENT/PLAN:  Assessment: Principal Problem:   Acute renal failure Active Problems:   Ischemic cardiomyopathy   Atrial fibrillation (HCC)   Hypercoagulable state   Malaise   Back pain   Macrocytic anemia   Thrombocytopenia   PAD (peripheral artery disease)   Encephalopathy   Protein-calorie malnutrition, severe   ESRD on dialysis (HCC)   Anemia, chronic disease   Plan: [name] is a 83 year old gentleman with past medical history of CAD, CAD with ICD, HLP, CKD stage III, paroxysmal a-fib on DOAC, hypertension, came here  with chief complaint of malaise and fatigue after 2 weeks of diarrhea, admitted here for elevated creatinine (12).   #AKI on CKD #Confusion #Malaise Cr: 6.7( went up from 5.3) , BUN:52 Carlin PARAS. [name] is an 83 year old male with HFrEF (EF 35% on last TTE 2019), CAD, ICD, HTN, hyperlipidemia, and atrial fibrillation, presenting with weakness and diarrhea x 2 weeks, found to have severe AKI on CKD stage IV. Oliguria suspected (output ~100 mL/24h).  Nephrology: The patient was transported to the unit, alert and oriented, signed informed consent, tolerated treatment well, but remained restless and in pain throughout, receiving 5mg  of oxycodone , completing 1hr of treatment Plan: - pre-renal AKI vs progressive CKD with uremic symptoms. - Hemodialysis today, had 2 episodes of hemodialysis (HD cath and #1 treatment 10/11, #2 10/11 ) - Strict I/Os, daily weights, monitor PVR each shift. - Avoid extremes of BP and nephrotoxins. - Hold RAAS blockers/Entresto  due to AKI and HFrEF. - Monitor labs: electrolytes, kidney function, FeNa, urine studies, myeloma work-up pending. - Today CK:500, CK: 1200 ? 1700 ? 1200; statin held.  #Normocytic Anemia HB: 6.7 (down from 7), MCV:95.  Iron:137, B12:249, folate: normal.DDX: MM, Anemia of chronic disease, Bone marrow decreased production - Blood transfusion 1 unit today - SPEP, UPEP, SFLC pending. - Hb trending down;Transfuse if Hb <7 g/dL.   #Back Pain Patient has thoracic and lumbar spondylosis with degenerative disc disease and moderate central spinal stenosis at L3-L5. Imaging shows:Right foraminal impingement at L3-L4 and L4-L5, Moderate left foraminal impingement at L2-L3 - Continue pain management tailored to his comorbidities (HFrEF, CKD, and AKI) - Diclofenac gel, lidocaine  patch, Dilaudid  IV as needed - Gabapentin  300 twice daily and Motrin 150 4 times daily for pain and anxiety management - Avoid nephrotoxic or cardiodepressant medications -  Monitor for changes in mobility, neurologic symptoms, or worsening pain - Consider physical therapy or consult spine/pain specialist if pain persists or worsens  #Falls/Debility #Poor Oral Intake  Consider uremia as contributing factor. Primary team to continue work-up.  #Recovered HFrEF Appears euvolemic after overnight hydration. Last Echo 2023: EF:55-60 - Entresto  held in setting of severe AKI.  #HTN BP slightly elevated; monitor closely. - Avoid hypotension; continue holding Entresto .   #Atrial Fibrillation (HCC) Chronic, paroxysmal. Low AF burden by ICD interrogation. Rhythm currently sinus. - Resume outpatient Eliquis  2.5 mg BID.  #NAGMA Improved, bicarb: 19, AG:16  Present in context of severe AKI. - Improving with bicarbonate infusion, continue until dialysis.  #Hyperkalemia - Resolved following Lokelma administration.  #Hyperlipidemia - CK mildly elevated;  statin held due to AKI.  #Thrombocytopenia Mild, stable in 120s. Smear shows normal platelet morphology. Continue monitoring.    Best Practice: Diet: Regular diet IVF: Fluids: sodium bicarbonate infusion  VTE: apixaban  (ELIQUIS ) tablet 2.5 mg Start: 08/10/24 1000 Code: Full, but wouldn't want life-sustaining measures if he couldn't be liberated from ventilator    Disposition planning: Therapy Recs: None,  Family Contact: Wife DISPO: Anticipated discharge in 2 days to SNF pending medical IMPROVEMENT [facility]  1:35 PM, 08/15/2024  On Call pager [phone]

## 2024-08-15 NOTE — Progress Notes (Signed)
 Physical Therapy Treatment Patient Details Name: Connor Wilkerson MRN: [id number] DOB: 03-31-1941 Today's Date: 08/15/2024   History of Present Illness Connor Wilkerson is a 83 y.o. male who presented to [facility] ED 08/10/24 with c/o weakness, difficulty walking, poor PO intake, and diarrhea x 2 weeks. Found to have acute renal failure with suspected oliguria. S/p Tunneled HD catheter placement and initial HD treatment 10/10. PMHx: HFrEF, CAD, ICD, CKD 3b, HTN, HLP, A fib, MI, PVD, chronic back pain, and depression.    PT Comments  Pt resting in bed on arrival, agreeable to session, however pt continues to be limited in mobility progression by nausea and symptomatic orthostatic hypotension (see BP readings in general comments below). Pt able to transfer supine<>sit with min A, sit<>stand with min A and take a few steps along EOB to [name], however pt standing tolerance limited due to dizziness and nausea. Pt returning to supine at end of session. Encouraged pt to elevated HOB as tolerated throughout day to increased upright tolerance. Pt continues to benefit from skilled PT services to progress toward functional mobility goals.     If plan is discharge home, recommend the following: A lot of help with walking and/or transfers;A lot of help with bathing/dressing/bathroom;Assistance with cooking/housework;Assist for transportation;Help with stairs or ramp for entrance   Can travel by private vehicle     No  Equipment Recommendations  Wheelchair (measurements PT);Wheelchair cushion (measurements PT)    Recommendations for Other Services       Precautions / Restrictions Precautions Precautions: Fall Recall of Precautions/Restrictions: Impaired Restrictions Weight Bearing Restrictions Per Provider Order: No     Mobility  Bed Mobility Overal bed mobility: Needs Assistance Bed Mobility: Supine to Sit, Sit to Supine     Supine to sit: Min assist, HOB elevated, Used rails Sit to supine: Min  assist   General bed mobility comments: Pt sat up on L side of bed with CGA and verbal cues to scoot towards EOB. Pt required increased time and use of bed rails.    Transfers Overall transfer level: Needs assistance Equipment used: Rolling walker (2 wheels) Transfers: Sit to/from Stand, Bed to chair/wheelchair/BSC Sit to Stand: Min assist           General transfer comment: Pt stood from bed at lowest height with Min A. pt able to take steps along EOB to [facility] as pt requesting to lie back down due to fatigue and dizzniess    Ambulation/Gait               General Gait Details: Deferred d/t symptomatic orthostatic hypotension, fatigue, and generalized weakness.   Stairs             Wheelchair Mobility     Tilt Bed    Modified Rankin (Stroke Patients Only)       Balance Overall balance assessment: Needs assistance Sitting-balance support: Bilateral upper extremity supported, Feet supported Sitting balance-Leahy Scale: Fair Sitting balance - Comments: Pt sat EOB with close supervision Postural control: Posterior lean Standing balance support: Bilateral upper extremity supported, During functional activity, Reliant on assistive device for balance Standing balance-Leahy Scale: Poor Standing balance comment: Pt dependent on RW to maintain static and dynamic standing balance.                            Communication Communication Communication: No apparent difficulties  Cognition Arousal: Alert Behavior During Therapy: Flat affect, Impulsive   PT - Cognitive  impairments: No family/caregiver present to determine baseline, Orientation                         Following commands: Impaired Following commands impaired: Only follows one step commands consistently    Cueing Cueing Techniques: Verbal cues, Tactile cues, Visual cues  Exercises      General Comments General comments (skin integrity, edema, etc.): pt with nausea througout  session, BP supine 116/65(80), sitting EOB 109/60(74) staindg 104/61(760 pt unable to maintain standing for 3 min reading      Pertinent Vitals/Pain Pain Assessment Pain Assessment: Faces Faces Pain Scale: Hurts a little bit Pain Location: back Pain Descriptors / Indicators: Guarding, Aching Pain Intervention(s): Monitored during session, Limited activity within patient's tolerance, RN gave pain meds during session (RN applying pain patch during session)    Home Living                          Prior Function            PT Goals (current goals can now be found in the care plan section) Acute Rehab PT Goals PT Goal Formulation: Patient unable to participate in goal setting Time For Goal Achievement: 08/25/24 Progress towards PT goals: Progressing toward goals    Frequency    Min 2X/week      PT Plan      Co-evaluation              AM-PAC PT 6 Clicks Mobility   Outcome Measure  Help needed turning from your back to your side while in a flat bed without using bedrails?: A Little Help needed moving from lying on your back to sitting on the side of a flat bed without using bedrails?: A Little Help needed moving to and from a bed to a chair (including a wheelchair)?: A Lot Help needed standing up from a chair using your arms (e.g., wheelchair or bedside chair)?: A Little Help needed to walk in hospital room?: A Lot Help needed climbing 3-5 steps with a railing? : A Lot 6 Click Score: 15    End of Session Equipment Utilized During Treatment: Gait belt Activity Tolerance: Treatment limited secondary to medical complications (Comment) (symptomatic orthostatic hypotension) Patient left: in bed;with call bell/phone within reach;with bed alarm set;with nursing/sitter in room Nurse Communication: Mobility status PT Visit Diagnosis: Muscle weakness (generalized) (M62.81);Difficulty in walking, not elsewhere classified (R26.2);Unsteadiness on feet  (R26.81);Dizziness and giddiness (R42)     Time: 8548-8490 PT Time Calculation (min) (ACUTE ONLY): 18 min  Charges:    $Therapeutic Activity: 8-22 mins PT General Charges $$ ACUTE PT VISIT: 1 Visit                     Connor Wilkerson R. PTA Acute Rehabilitation Services Office: [phone]   Therisa CHRISTELLA Boor 08/15/2024, 4:15 PM

## 2024-08-15 NOTE — Progress Notes (Signed)
 Patient ID: COBURN KNAUS, male   DOB: 30-Jul-1941, 83 y.o.   MRN: [id number] S: Complaining about being disturbed all the time and wants to go to sleep. O:BP (!) 155/83 (BP Location: Right Arm)   Pulse 63   Temp 98.2 F (36.8 C) (Oral)   Resp 16   Wt 68.6 kg   SpO2 100%   BMI 21.70 kg/m   Intake/Output Summary (Last 24 hours) at 08/15/2024 1023 Last data filed at 08/15/2024 0858 Gross per 24 hour  Intake 120 ml  Output 495 ml  Net -375 ml   Intake/Output: I/O last 3 completed shifts: In: 597 [P.O.:597] Out: 195 [Urine:195]  Intake/Output this shift:  Total I/O In: -  Out: 300 [Urine:300] Weight change:  Gen: NAD CVS: RRR Resp:CTA Abd: +BS, soft, NT/nD Ext: no edema  Recent Labs  Lab 08/10/24 0537 08/10/24 0545 08/11/24 0313 08/12/24 0400 08/12/24 1722 08/13/24 1207 08/13/24 1528 08/14/24 1846 08/15/24 0615  NA 136   < > 139 137 139 136 137 135 135  K 5.1   < > 4.5 4.8 3.7 3.8 4.1 3.9 4.1  CL 110   < > 106 102 101 99 99 98 99  CO2 11*  --  18* 19* 24 23 26 23  21*  GLUCOSE 112*   < > 130* 99 80 102* 103* 147* 115*  BUN 153*   < > 146* 142* 72* 49* 52* 66* 78*  CREATININE 11.59*   < > 10.90* 10.80* 6.64* 4.90* 5.31* 6.63* 6.95*  ALBUMIN  3.7  --  3.1* 3.0* 3.1* 3.3* 3.3* 3.0* 2.9*  CALCIUM  8.6*  --  8.3* 8.0* 8.3* 8.4* 8.4* 7.9* 7.9*  PHOS  --   --  7.7* 8.0* 4.3 3.9 5.4* 6.7* 7.3*  AST 33  --   --   --   --   --   --   --   --   ALT 32  --   --   --   --   --   --   --   --    < > = values in this interval not displayed.   Liver Function Tests: Recent Labs  Lab 08/10/24 0537 08/11/24 0313 08/13/24 1528 08/14/24 1846 08/15/24 0615  AST 33  --   --   --   --   ALT 32  --   --   --   --   ALKPHOS 50  --   --   --   --   BILITOT 0.6  --   --   --   --   PROT 6.2*  --   --   --   --   ALBUMIN  3.7   < > 3.3* 3.0* 2.9*   < > = values in this interval not displayed.   No results for input(s): LIPASE, AMYLASE in the last 168 hours. No results for  input(s): AMMONIA in the last 168 hours. CBC: Recent Labs  Lab 08/10/24 0537 08/10/24 0545 08/11/24 0313 08/12/24 0400 08/13/24 0807 08/14/24 0218 08/15/24 0615  WBC 7.6  --  6.7 7.5 7.0 6.5 7.0  NEUTROABS 5.5  --   --   --   --   --   --   HGB 8.0*   < > 7.0* 7.2* 7.3* 7.0* 6.7*  HCT 24.4*   < > 20.5* 21.4* 21.9* 20.9* 19.9*  MCV 100.4*  --  95.8 96.8 97.8 98.1 98.5  PLT 134*  --  125* 119*  122* 111* 123*   < > = values in this interval not displayed.   Cardiac Enzymes: Recent Labs  Lab 08/10/24 0742 08/13/24 0807 08/14/24 0218 08/15/24 0615  CKTOTAL 1,210* 1,719* 1,115* 523*   CBG: Recent Labs  Lab 08/10/24 1338  GLUCAP 112*    Iron Studies: No results for input(s): IRON, TIBC, TRANSFERRIN, FERRITIN in the last 72 hours. Studies/Results: No results found.  sodium chloride    Intravenous Once   acetaminophen   1,000 mg Oral Q6H   apixaban   2.5 mg Oral BID   buPROPion   150 mg Oral Daily   Chlorhexidine  Gluconate Cloth  6 each Topical Q0600   darbepoetin (ARANESP) injection - DIALYSIS  100 mcg Subcutaneous Q Sat-1800   diclofenac Sodium  4 g Topical QID   feeding supplement  237 mL Oral BID BM   gabapentin   300 mg Oral QHS   lidocaine   1 patch Transdermal Q24H   senna-docusate  2 tablet Oral QHS    BMET    Component Value Date/Time   NA 135 08/15/2024 0615   NA 142 07/23/2022 1430   K 4.1 08/15/2024 0615   CL 99 08/15/2024 0615   CO2 21 (L) 08/15/2024 0615   GLUCOSE 115 (H) 08/15/2024 0615   BUN 78 (H) 08/15/2024 0615   BUN 24 07/23/2022 1430   CREATININE 6.95 (H) 08/15/2024 0615   CREATININE 2.02 (H) 10/23/2016 1139   CALCIUM  7.9 (L) 08/15/2024 0615   GFRNONAA 7 (L) 08/15/2024 0615   GFRAA 44 (L) 02/29/2020 1534   CBC    Component Value Date/Time   WBC 7.0 08/15/2024 0615   RBC 2.02 (L) 08/15/2024 0615   HGB 6.7 (LL) 08/15/2024 0615   HGB 12.5 (L) 07/23/2022 1430   HCT 19.9 (L) 08/15/2024 0615   HCT 36.6 (L) 07/23/2022 1430   PLT  123 (L) 08/15/2024 0615   PLT 177 07/23/2022 1430   MCV 98.5 08/15/2024 0615   MCV 99 (H) 07/23/2022 1430   MCH 33.2 08/15/2024 0615   MCHC 33.7 08/15/2024 0615   RDW 12.2 08/15/2024 0615   RDW 12.4 07/23/2022 1430   LYMPHSABS 1.1 08/10/2024 0537   LYMPHSABS 2.1 01/04/2018 1432   MONOABS 0.8 08/10/2024 0537   EOSABS 0.1 08/10/2024 0537   EOSABS 0.2 01/04/2018 1432   BASOSABS 0.1 08/10/2024 0537   BASOSABS 0.0 01/04/2018 1432    Assessment/Plan ZAKI GERTSCH is an 83 y.o. male with HFrEF (last TTE 2019 EF 35%), CAD, ICD, HTN, HL, A fib presenting with weakness and diarrhea x 2 weeks who is found to have severe AKI on CKD for which nephrology is consulted.    **Severe AKI on CKD 4: No recent labs - 10/2023 Cr 2.1 which had been baseline.  Presented with 3 wk history of symptoms - unclear if this is prerenal state leading to AKI on CKD vs progressive CKD presenting with uremic symptoms.  UA dip +bld, 1+ protein but no RBC on microscopy (CK ~1000  holding statin), FeNa 5%, U/P 0.9, Myeloma w/u SFLC ok but SPEP and UPEP pending.  CT a/p no obstruction.  With mild thrombocytopenia smear checked - no schistocytes.  Holding ARB.   Did not have substantial improvement with hydration and with likely uremic symptoms - recommended dialysis.   -HD cath and #1 treatment 10/11, #2 10/11 - terminated early due to back pain.  Has had remarkable improvement supporting his presentation was uremia.  For now he remains oliguric and will plan for next  dialysis today for 3rd session - will ask primary team to work on back pain and he requests to try to come in a chair.  Will need to start outpt CLIP today with assistance of renal navigator.    Avoid extremes of BP and nephrotoxins.  Strict I/Os, daily weights.     **Back pain: chronic just worse in bed and limiting dialysis per above; request primary team address.  Will do HD in chair today.   **HFrEF:  last known EF 35%. appearing euvolemic after hydration.   Hold entresto  in setting of severe AKI.   No UF with HD for now.   **HTN: holding entresto .  BP ok currently.    **HL:  CK mildly elevated and with AKI holding statin.   **thrombocytopenia:  mild, smear normal plt morphology - stable in 110-120s, CTM   **Anemia:  macrocytic, iron, B12 folate all ok. Pending SPEP, UPEP, SFLC - 1.2.  Hemoccult neg. Transfuse < 7.  Started ESA.    **Falls/debility: likely uremia contributing, other w/u per primary. Needs PT/OT.   Fairy RONAL Sellar, MD [facility]

## 2024-08-15 NOTE — TOC Progression Note (Addendum)
 Transition of Care [facility]) - Progression Note    Patient Details  Name: Connor Wilkerson MRN: [id number] Date of Birth: October 11, 1941  [facility] of Care [facility] CM/SW Contact  Bridget Cordella Simmonds, LCSW Phone Number: 08/15/2024, 10:01 AM  Clinical Narrative:   CSW spoke with wife [name] in room, daughter [name] on phone.  Bed offers discussed, they will accept offer at [facility] rehab.  CSW spoke with [facility]: they will need HD in [place] on MWF schedule.  Renal navigator notified.   1330: no HD slots available in [place], only [place].  CSW message with [facility]: they cannot transport pt to [place] HD site.   1430: pt wife at MD appt, CSW spoke with daughter [name] and updated her on the above.  Discussed other bed offers, first choice would be [facility].  [facility]ail/renal notified: [facility] can only take if pt can have HD MWF.  Expected Discharge Plan: Skilled Nursing Facility Barriers to Discharge: Continued Medical Work up, SNF Pending bed offer               Expected Discharge Plan and Services In-house Referral: Clinical Social Work   Post Acute Care Choice: Skilled Nursing Facility Living arrangements for the past 2 months: Single Family Home                                       Social Drivers of Health (SDOH) Interventions SDOH Screenings   Food Insecurity: No Food Insecurity (08/10/2024)  Housing: Low Risk  (08/10/2024)  Transportation Needs: No Transportation Needs (08/10/2024)  Utilities: Not At Risk (08/10/2024)  Social Connections: Patient Declined (08/10/2024)  Tobacco Use: Medium Risk (08/10/2024)    Readmission Risk Interventions     No data to display

## 2024-08-16 LAB — TYPE AND SCREEN
ABO/RH(D): O POS
Antibody Screen: NEGATIVE
Unit division: 0

## 2024-08-16 LAB — BPAM RBC
Blood Product Expiration Date: 202511062359
ISSUE DATE / TIME: 202510131253
Unit Type and Rh: 5100

## 2024-08-16 LAB — RENAL FUNCTION PANEL
Albumin: 2.8 g/dL — ABNORMAL LOW (ref 3.5–5.0)
Anion gap: 15 (ref 5–15)
BUN: 91 mg/dL — ABNORMAL HIGH (ref 8–23)
CO2: 20 mmol/L — ABNORMAL LOW (ref 22–32)
Calcium: 8.1 mg/dL — ABNORMAL LOW (ref 8.9–10.3)
Chloride: 103 mmol/L (ref 98–111)
Creatinine, Ser: 7.56 mg/dL — ABNORMAL HIGH (ref 0.61–1.24)
GFR, Estimated: 7 mL/min — ABNORMAL LOW (ref >60)
Glucose, Bld: 112 mg/dL — ABNORMAL HIGH (ref 70–99)
Phosphorus: 7.5 mg/dL — ABNORMAL HIGH (ref 2.5–4.6)
Potassium: 4.8 mmol/L (ref 3.5–5.1)
Sodium: 138 mmol/L (ref 135–145)

## 2024-08-16 LAB — CK: Total CK: 359 U/L (ref 49–397)

## 2024-08-16 MED ORDER — SEVELAMER CARBONATE 800 MG PO TABS
800.0000 mg | ORAL_TABLET | Freq: Three times a day (TID) | ORAL | Status: DC
  Administered 2024-08-17: 800 mg via ORAL
  Filled 2024-08-16 (×2): qty 1

## 2024-08-16 MED ORDER — HEPARIN SODIUM (PORCINE) 1000 UNIT/ML IJ SOLN
INTRAMUSCULAR | Status: AC
  Filled 2024-08-16: qty 3

## 2024-08-16 NOTE — Progress Notes (Signed)
 OT Cancellation Note  Patient Details Name: Connor Wilkerson MRN: [id number] DOB: 1941/07/23   Cancelled Treatment:    Reason Eval/Treat Not Completed: Other (comment) OT attempted follow-up tx session upon Pt return to room. Pt declined to participate in session d/t pain and fatigue. OT provided Pt with cool washcloth for face. OT will follow-up as appropriate and as schedule allows.   Maurilio CROME, OTR/LSABRA  [facility] Acute Rehabilitation  Office: [phone]   Maurilio JINNY Crystalann Korf 08/16/2024, 3:50 PM

## 2024-08-16 NOTE — Progress Notes (Signed)
   08/16/24 1325  Vitals  Temp 97.9 F (36.6 C)  Temp Source Oral  BP (!) 152/85  MAP (mmHg) 105  BP Location Right Arm  BP Method Automatic  Patient Position (if appropriate) Lying  Pulse Rate 81  Pulse Rate Source Monitor  Resp 16  Oxygen  Therapy  SpO2 98 %  O2 Device Room Air  During Treatment Monitoring  Blood Flow Rate (mL/min) 0 mL/min  Arterial Pressure (mmHg) -1.21 mmHg  Venous Pressure (mmHg) -0.61 mmHg  TMP (mmHg) -48.68 mmHg  Ultrafiltration Rate (mL/min) 179 mL/min  Dialysate Flow Rate (mL/min) 300 ml/min  Duration of HD Treatment -hour(s) 2.95 hour(s)  Cumulative Fluid Removed (mL) per Treatment  -8.98  Post Treatment  Dialyzer Clearance Clear  Hemodialysis Intake (mL) 0 mL  Liters Processed 70.8  Fluid Removed (mL) 0 mL  Tolerated HD Treatment Yes  Post-Hemodialysis Comments tx completed 4 minutes early pt started being restless 30 minutes prior  Hemodialysis Catheter Right Internal jugular Double lumen Permanent (Tunneled)  Placement Date/Time: 08/12/24 1110   Placed prior to admission: No  Serial / Lot #: [id number]  Expiration Date: 04/02/29  Maximum sterile barrier precautions: Hand hygiene;Cap;Mask;Sterile gown;Sterile gloves;Large sterile sheet;Sterile probe cover  S...  Site Condition No complications  Blue Lumen Status Flushed;Blood return noted  Red Lumen Status Flushed;Blood return noted  Purple Lumen Status N/A  Catheter fill solution Heparin  1000 units/ml  Catheter fill volume (Arterial) 1.9 cc  Catheter fill volume (Venous) 1.9  Dressing Type Transparent  Dressing Status Clean;Dry;Intact  Interventions Dressing changed  Dressing Change Due 08/23/24  Post treatment catheter status Capped and Clamped   Received patient in bed to unit.  Alert and oriented.  Informed consent signed and in chart.   TX duration: 3  Became very anxious 30 minutes before tx ended Transported back to the room  Alert, without acute distress.  Hand-off given to  patient's nurse.   Access used: RT HD Access issues: none  Total UF removed: 0 Medication(s) given: none Post HD VS: see above Post HD weight: n/s   Connor Wilkerson [facility]

## 2024-08-16 NOTE — TOC Progression Note (Addendum)
 Transition of Care [facility]) - Progression Note    Patient Details  Name: Connor Wilkerson MRN: [id number] Date of Birth: Jun 15, 1941  Transition of [facility]) CM/SW Contact  Bridget Cordella Simmonds, LCSW Phone Number: 08/16/2024, 11:43 AM  Clinical Narrative:   CSW spoke with [facility] and renal/Abigail several times, HD MWF 1215 available at [facility] location.  [facility] confirmed that they can accept this HD slot.  1440: [facility] cannot receive pt today, can take tomorrow after HD. SNF auth request submitted in [place] and approved: [id number], 3 days: 10/15-10/17.  Expected Discharge Plan: Skilled Nursing Facility Barriers to Discharge: Continued Medical Work up, SNF Pending bed offer               Expected Discharge Plan and Services In-house Referral: Clinical Social Work   Post Acute Care Choice: Skilled Nursing Facility Living arrangements for the past 2 months: Single Family Home                                       Social Drivers of Health (SDOH) Interventions SDOH Screenings   Food Insecurity: No Food Insecurity (08/10/2024)  Housing: Low Risk  (08/10/2024)  Transportation Needs: No Transportation Needs (08/10/2024)  Utilities: Not At Risk (08/10/2024)  Social Connections: Patient Declined (08/10/2024)  Tobacco Use: Medium Risk (08/10/2024)    Readmission Risk Interventions     No data to display

## 2024-08-16 NOTE — Progress Notes (Signed)
 Patient ID: Connor Wilkerson, male   DOB: 14-Oct-1941, 83 y.o.   MRN: [id number] S: Seen on HD sitting in recliner.  No new complaints O:BP (!) 168/69 (BP Location: Right Arm)   Pulse 69   Temp 98 F (36.7 C)   Resp (!) 9   Wt 68.6 kg   SpO2 100%   BMI 21.70 kg/m   Intake/Output Summary (Last 24 hours) at 08/16/2024 1039 Last data filed at 08/16/2024 0556 Gross per 24 hour  Intake 522.83 ml  Output 500 ml  Net 22.83 ml   Intake/Output: I/O last 3 completed shifts: In: 762.8 [P.O.:360; I.V.:22.8; Blood:380] Out: 995 [Urine:995]  Intake/Output this shift:  No intake/output data recorded. Weight change:  Gen: NAD CVS: RRR Resp: CTA Abd:+BS, soft, NT/ND Ext: no edema  Recent Labs  Lab 08/10/24 0537 08/10/24 0545 08/12/24 0400 08/12/24 1722 08/13/24 1207 08/13/24 1528 08/14/24 1846 08/15/24 0615 08/16/24 0435  NA 136   < > 137 139 136 137 135 135 138  K 5.1   < > 4.8 3.7 3.8 4.1 3.9 4.1 4.8  CL 110   < > 102 101 99 99 98 99 103  CO2 11*   < > 19* 24 23 26 23  21* 20*  GLUCOSE 112*   < > 99 80 102* 103* 147* 115* 112*  BUN 153*   < > 142* 72* 49* 52* 66* 78* 91*  CREATININE 11.59*   < > 10.80* 6.64* 4.90* 5.31* 6.63* 6.95* 7.56*  ALBUMIN  3.7   < > 3.0* 3.1* 3.3* 3.3* 3.0* 2.9* 2.8*  CALCIUM  8.6*   < > 8.0* 8.3* 8.4* 8.4* 7.9* 7.9* 8.1*  PHOS  --    < > 8.0* 4.3 3.9 5.4* 6.7* 7.3* 7.5*  AST 33  --   --   --   --   --   --   --   --   ALT 32  --   --   --   --   --   --   --   --    < > = values in this interval not displayed.   Liver Function Tests: Recent Labs  Lab 08/10/24 0537 08/11/24 0313 08/14/24 1846 08/15/24 0615 08/16/24 0435  AST 33  --   --   --   --   ALT 32  --   --   --   --   ALKPHOS 50  --   --   --   --   BILITOT 0.6  --   --   --   --   PROT 6.2*  --   --   --   --   ALBUMIN  3.7   < > 3.0* 2.9* 2.8*   < > = values in this interval not displayed.   No results for input(s): LIPASE, AMYLASE in the last 168 hours. No results for  input(s): AMMONIA in the last 168 hours. CBC: Recent Labs  Lab 08/10/24 0537 08/10/24 0545 08/11/24 0313 08/12/24 0400 08/13/24 0807 08/14/24 0218 08/15/24 0615 08/15/24 2033  WBC 7.6  --  6.7 7.5 7.0 6.5 7.0  --   NEUTROABS 5.5  --   --   --   --   --   --   --   HGB 8.0*   < > 7.0* 7.2* 7.3* 7.0* 6.7* 7.3*  HCT 24.4*   < > 20.5* 21.4* 21.9* 20.9* 19.9* 21.7*  MCV 100.4*  --  95.8 96.8 97.8 98.1  98.5  --   PLT 134*  --  125* 119* 122* 111* 123*  --    < > = values in this interval not displayed.   Cardiac Enzymes: Recent Labs  Lab 08/10/24 0742 08/13/24 0807 08/14/24 0218 08/15/24 0615 08/16/24 0435  CKTOTAL 1,210* 1,719* 1,115* 523* 359   CBG: Recent Labs  Lab 08/10/24 1338  GLUCAP 112*    Iron Studies: No results for input(s): IRON, TIBC, TRANSFERRIN, FERRITIN in the last 72 hours. Studies/Results: No results found.  acetaminophen   1,000 mg Oral Q6H   apixaban   2.5 mg Oral BID   buPROPion   150 mg Oral Daily   Chlorhexidine  Gluconate Cloth  6 each Topical Q0600   darbepoetin (ARANESP) injection - DIALYSIS  100 mcg Subcutaneous Q Sat-1800   diclofenac Sodium  4 g Topical QID   feeding supplement  237 mL Oral BID BM   gabapentin   300 mg Oral QHS   lidocaine   1 patch Transdermal Q24H   senna-docusate  2 tablet Oral QHS   sevelamer carbonate  800 mg Oral TID WC    BMET    Component Value Date/Time   NA 138 08/16/2024 0435   NA 142 07/23/2022 1430   K 4.8 08/16/2024 0435   CL 103 08/16/2024 0435   CO2 20 (L) 08/16/2024 0435   GLUCOSE 112 (H) 08/16/2024 0435   BUN 91 (H) 08/16/2024 0435   BUN 24 07/23/2022 1430   CREATININE 7.56 (H) 08/16/2024 0435   CREATININE 2.02 (H) 10/23/2016 1139   CALCIUM  8.1 (L) 08/16/2024 0435   GFRNONAA 7 (L) 08/16/2024 0435   GFRAA 44 (L) 02/29/2020 1534   CBC    Component Value Date/Time   WBC 7.0 08/15/2024 0615   RBC 2.02 (L) 08/15/2024 0615   HGB 7.3 (L) 08/15/2024 2033   HGB 12.5 (L) 07/23/2022 1430    HCT 21.7 (L) 08/15/2024 2033   HCT 36.6 (L) 07/23/2022 1430   PLT 123 (L) 08/15/2024 0615   PLT 177 07/23/2022 1430   MCV 98.5 08/15/2024 0615   MCV 99 (H) 07/23/2022 1430   MCH 33.2 08/15/2024 0615   MCHC 33.7 08/15/2024 0615   RDW 12.2 08/15/2024 0615   RDW 12.4 07/23/2022 1430   LYMPHSABS 1.1 08/10/2024 0537   LYMPHSABS 2.1 01/04/2018 1432   MONOABS 0.8 08/10/2024 0537   EOSABS 0.1 08/10/2024 0537   EOSABS 0.2 01/04/2018 1432   BASOSABS 0.1 08/10/2024 0537   BASOSABS 0.0 01/04/2018 1432    Assessment/Plan Connor Wilkerson is an 83 y.o. male with HFrEF (last TTE 2019 EF 35%), CAD, ICD, HTN, HL, A fib presenting with weakness and diarrhea x 2 weeks who is found to have severe AKI on CKD for which nephrology is consulted.    **Severe AKI on CKD 4: No recent labs - 10/2023 Cr 2.1 which had been baseline.  Presented with 3 wk history of symptoms - unclear if this is prerenal state leading to AKI on CKD vs progressive CKD presenting with uremic symptoms.  UA dip +bld, 1+ protein but no RBC on microscopy (CK ~1000  holding statin), FeNa 5%, U/P 0.9, Myeloma w/u SFLC ok but SPEP and UPEP pending.  CT a/p no obstruction.  With mild thrombocytopenia smear checked - no schistocytes.  Holding ARB.   Did not have substantial improvement with hydration and with likely uremic symptoms - recommended dialysis.   -HD cath and #1 treatment 10/11, #2 10/11 - terminated early due to back pain.  Has had remarkable improvement supporting his presentation was uremia.  For now he remains oliguric and will plan for next dialysis today for 3rd session (rolled over from yesterday due to high hospital census)- Will keep on TTS schedule for now.  CLIP process underway but unfortunately [facility] is at capacity and nearest would be [facility].  Awaiting to hear from SNF if they can transport to and from HD.    Avoid extremes of BP and nephrotoxins.  Strict I/Os, daily weights.     **Back pain: chronic just  worse in bed and limiting dialysis per above; request primary team address.  Will do HD in chair today.   **HFrEF:  last known EF 35%. appearing euvolemic after hydration.  Hold entresto  in setting of severe AKI.   No UF with HD for now.   **HTN: holding entresto .  BP ok currently.    **HL:  CK mildly elevated and with AKI holding statin.   **thrombocytopenia:  mild, smear normal plt morphology - stable in 110-120s, CTM   **Anemia:  macrocytic, iron, B12 folate all ok. Pending SPEP, UPEP, SFLC - 1.2.  Hemoccult neg. Transfuse < 7.  Started ESA.    **Falls/debility: likely uremia contributing, other w/u per primary. Needs PT/OT  Fairy RONAL Sellar, MD [facility] [phone]

## 2024-08-16 NOTE — Progress Notes (Cosign Needed)
 HD#6 SUBJECTIVE:  Patient Summary: Connor Wilkerson is a 83 year old gentleman with past medical history of CAD, CAD with ICD, HLP, CKD stage III, paroxysmal a-fib on DOAC, hypertension, came here with chief complaint of malaise and fatigue after 2 weeks of diarrhea, admitted here for severe AKI and uremic encephalopathy requiring hemodialysis treatment.    Overnight Events: NAEON  Interim History: Visited the patient in room H today. The patient was comfortably seated in a chair, resting, and was oriented to person, place, and time. Denied any chest pain or shortness of breath. Reported normal bowel movements and urination. No complaints of headache or back pain. Overall, the patient is feeling better compared to yesterday.   OBJECTIVE:  Vital Signs: Vitals:   08/15/24 1500 08/15/24 1625 08/15/24 1946 08/16/24 0410  BP: 109/62 131/62 117/63 (!) 146/88  Pulse:  70 74 77  Resp: 16 17 16 17   Temp: 98.5 F (36.9 C) 98.5 F (36.9 C) 98 F (36.7 C) 97.9 F (36.6 C)  TempSrc: Oral Oral  Oral  SpO2: 100% 98% 99% 99%  Weight:       Supplemental O2: Room Air SpO2: 99 % O2 Flow Rate (L/min): 2 L/min  Filed Weights   08/12/24 1541 08/13/24 0857 08/13/24 1114  Weight: 64.4 kg 69.5 kg 68.6 kg     Intake/Output Summary (Last 24 hours) at 08/16/2024 0722 Last data filed at 08/16/2024 0556 Gross per 24 hour  Intake 762.83 ml  Output 800 ml  Net -37.17 ml   Net IO Since Admission: 2,665.97 mL [08/16/24 0722]  Physical Exam: Physical Exam General: The patient is resting comfortably on hemodialysis chair. Cardiovascular: Heart rate and rhythm are normal, with no murmurs auscultated. Pulmonary: Breathing comfortably and without effort. Lateral and anterior lung fields clear, with no wheezing or crackles noted. Abdomen: Soft to palpation, without tenderness, cachectic. Musculoskeletal: No lower extremity edema. The patient moves extremities spontaneously. Neurological: The patient is  oriented to self, date and place. He follows commands and communicates effectively. Surgical Site: Pooled blood are noted around the [facility] site, trapped under the clear dressing. No active bleeding or discharge observed   Patient Lines/Drains/Airways Status     Active Line/Drains/Airways     Name Placement date Placement time Site Days   Peripheral IV 08/10/24 20 G 1 Anterior;Right Forearm 08/10/24  0529  Forearm  6   Hemodialysis Catheter Right Internal jugular Double lumen Permanent (Tunneled) 08/12/24  1110  Internal jugular  4            Pertinent labs and imaging:      Latest Ref Rng & Units 08/15/2024    8:33 PM 08/15/2024    6:15 AM 08/14/2024    2:18 AM  CBC  WBC 4.0 - 10.5 K/uL  7.0  6.5   Hemoglobin 13.0 - 17.0 g/dL 7.3  6.7  7.0   Hematocrit 39.0 - 52.0 % 21.7  19.9  20.9   Platelets 150 - 400 K/uL  123  111        Latest Ref Rng & Units 08/16/2024    4:35 AM 08/15/2024    6:15 AM 08/14/2024    6:46 PM  CMP  Glucose 70 - 99 mg/dL 887  884  852   BUN 8 - 23 mg/dL 91  78  66   Creatinine 0.61 - 1.24 mg/dL 2.43  3.04  3.36   Sodium 135 - 145 mmol/L 138  135  135   Potassium 3.5 - 5.1 mmol/L  4.8  4.1  3.9   Chloride 98 - 111 mmol/L 103  99  98   CO2 22 - 32 mmol/L 20  21  23    Calcium  8.9 - 10.3 mg/dL 8.1  7.9  7.9     No results found.  ASSESSMENT/PLAN:  Assessment: Principal Problem:   Acute renal failure Active Problems:   Ischemic cardiomyopathy   Atrial fibrillation (HCC)   Hypercoagulable state   Malaise   Back pain   Macrocytic anemia   Thrombocytopenia   PAD (peripheral artery disease)   Encephalopathy   Protein-calorie malnutrition, severe   ESRD on dialysis (HCC)   Anemia, chronic disease   Plan:  Connor Wilkerson is a 83 year old gentleman with past medical history of CAD, CAD with ICD, HLP, CKD stage III, paroxysmal a-fib on DOAC, hypertension, came here with chief complaint of malaise and fatigue after 2 weeks of diarrhea, admitted  here for elevated creatinine (12).   #AKI on CKD Improved.  Confusion resolved.   Cr: 7.56( went up from 6) , BUN:91, urine output: 800 Connor Wilkerson is an 83 year old male with recovered HFrEF, CAD, ICD, HTN, hyperlipidemia, and atrial fibrillation, presenting with weakness and diarrhea x 2 weeks, found to have severe AKI on CKD stage IV.  Dx: pre-renal AKI vs progressive CKD with uremic symptoms. Plan: - Hemodialysis today, had 2 episodes of hemodialysis (HD cath and #1 treatment 10/11, #2 10/11 ) - Strict I/Os, daily weights, monitor PVR each shift. - Avoid extremes of BP and nephrotoxins. - Hold RAAS blockers/Entresto  due to AKI and HFrEF. - Monitor labs: electrolytes, kidney function, FeNa, urine studies, myeloma work-up pending. - Today CK:300. 500, CK: 1200 ? 1700 ? 1200; statin held. -Sevelamer 800 3 times daily  #Normocytic Anemia Improved after 1 unit blood transfusion yesterday.  HB: 7.3 (up from 6.7) MCV:95.  Iron:137, B12:249, folate: normal.DDX: MM, Anemia of chronic disease, Bone marrow decreased production - SPEP, UPEP, SFLC pending. - Hb trending;Transfuse if Hb <7 g/dL.   #Back Pain Resolved.  Patient did not complain of any back pain.  Patient has thoracic and lumbar spondylosis with degenerative disc disease and moderate central spinal stenosis at L3-L5. Imaging shows:Right foraminal impingement at L3-L4 and L4-L5, Moderate left foraminal impingement at L2-L3 - Continue pain management tailored to his comorbidities (HFrEF, CKD, and AKI) - Diclofenac gel, lidocaine  patch, Dilaudid  IV as needed - Gabapentin  300 twice daily and Motrin 150 4 times daily for pain and anxiety management - Avoid nephrotoxic or cardiodepressant medications - Monitor for changes in mobility, neurologic symptoms, or worsening pain - Consider physical therapy or consult spine/pain specialist if pain persists or worsens  #Falls/Debility #Poor Oral Intake  Consider uremia as contributing  factor. Primary team to continue work-up. -Dietitian consult - Feeding supplements (Ensure Plus high-protein)  #Recovered HFrEF Appears euvolemic after overnight hydration. Last Echo 2023: EF:55-60 - Entresto  held in setting of severe AKI.  #HTN BP slightly elevated; monitor closely. - Avoid hypotension; continue holding Entresto .   #Atrial Fibrillation (HCC) Chronic, paroxysmal. Low AF burden by ICD interrogation. Rhythm currently sinus. - Resume outpatient Eliquis  2.5 mg BID.  #NAGMA Improved, bicarb: 19, AG:16  Present in context of severe AKI. - Improving with bicarbonate infusion, continue until dialysis.  #Hyperkalemia - Resolved following Lokelma administration.  #Hyperlipidemia - CK mildly elevated; statin held due to AKI.  #Thrombocytopenia Mild, stable in 120s. Smear shows normal platelet morphology. Continue monitoring.    Best Practice: Diet: Regular diet IVF: Fluids: sodium  bicarbonate infusion  VTE: apixaban  (ELIQUIS ) tablet 2.5 mg Start: 08/10/24 1000 Code: Full, but wouldn't want life-sustaining measures if he couldn't be liberated from ventilator    Disposition planning: Pending SNF with appropriate transportation to HD (MWF close) Therapy Recs: None,  Family Contact: Wife DISPO: Anticipated discharge in ? days to SNF pending SNF bed  signature:  A[facility]  7:22 AM, 08/16/2024  On Call pager [phone]

## 2024-08-16 NOTE — Progress Notes (Addendum)
 Reached out to [facility] admissions to follow up about clinic close to [facility] with MWF availability, there are none that are directly close with this. Navigator has contacted admissions support to please escalate this. SW notified at this time, will continue attempts to place pt in appropriate clinic and update as applicable.   Lavanda Jasline [facility] Nav [phone]  Addendum 115[facility] referral diverted to [facility] [place] [facility], MWF, 1215 chair time. Will continue to advise once pt cleared by clinic and financially cleared.   Addendum 2:05pm Pt medically cleared by clinic to start at [facility] tomorrow the 15th pending that [facility] can go ahead and take him tonight and transport to this appt tmrrw. He will need to arrive to 1st appt 1130am. AVS updated at this time. DO stated he is medically clear for d/c, nephrology informed, awaiting feedback from SW at this time.   Addendum2:48 Snf cannnot take today, therefore pt will likely start Friday the 17th at out-pt HD clinic. Clinic informed of this and to not expect tomorrow. AVS updated. Will continue to assist

## 2024-08-16 NOTE — Progress Notes (Signed)
 OT Cancellation Note  Patient Details Name: Connor Wilkerson MRN: [id number] DOB: 09/16/1941   Cancelled Treatment:    Reason Eval/Treat Not Completed: Other (comment) Pt off floor for HD. OT to continue to follow patient as appropriate and as schedule allows.   Connor Wilkerson, OTR/LSABRA  [facility]  Office: [phone]   Connor Wilkerson 08/16/2024, 11:33 AM

## 2024-08-17 ENCOUNTER — Other Ambulatory Visit (HOSPITAL_COMMUNITY): Payer: Self-pay

## 2024-08-17 ENCOUNTER — Encounter (HOSPITAL_COMMUNITY): Payer: Self-pay | Admitting: Internal Medicine

## 2024-08-17 DIAGNOSIS — R197 Diarrhea, unspecified: Secondary | ICD-10-CM | POA: Diagnosis not present

## 2024-08-17 DIAGNOSIS — E785 Hyperlipidemia, unspecified: Secondary | ICD-10-CM | POA: Diagnosis not present

## 2024-08-17 DIAGNOSIS — M6281 Muscle weakness (generalized): Secondary | ICD-10-CM | POA: Diagnosis not present

## 2024-08-17 DIAGNOSIS — I480 Paroxysmal atrial fibrillation: Secondary | ICD-10-CM

## 2024-08-17 DIAGNOSIS — R079 Chest pain, unspecified: Secondary | ICD-10-CM | POA: Diagnosis present

## 2024-08-17 DIAGNOSIS — I130 Hypertensive heart and chronic kidney disease with heart failure and stage 1 through stage 4 chronic kidney disease, or unspecified chronic kidney disease: Secondary | ICD-10-CM | POA: Diagnosis not present

## 2024-08-17 DIAGNOSIS — N186 End stage renal disease: Secondary | ICD-10-CM | POA: Diagnosis not present

## 2024-08-17 DIAGNOSIS — R0789 Other chest pain: Secondary | ICD-10-CM | POA: Diagnosis not present

## 2024-08-17 DIAGNOSIS — D631 Anemia in chronic kidney disease: Secondary | ICD-10-CM

## 2024-08-17 DIAGNOSIS — I120 Hypertensive chronic kidney disease with stage 5 chronic kidney disease or end stage renal disease: Secondary | ICD-10-CM | POA: Diagnosis not present

## 2024-08-17 DIAGNOSIS — Z4682 Encounter for fitting and adjustment of non-vascular catheter: Secondary | ICD-10-CM | POA: Diagnosis not present

## 2024-08-17 DIAGNOSIS — I739 Peripheral vascular disease, unspecified: Secondary | ICD-10-CM | POA: Diagnosis not present

## 2024-08-17 DIAGNOSIS — Z992 Dependence on renal dialysis: Secondary | ICD-10-CM | POA: Diagnosis not present

## 2024-08-17 DIAGNOSIS — R0602 Shortness of breath: Secondary | ICD-10-CM | POA: Diagnosis present

## 2024-08-17 DIAGNOSIS — D649 Anemia, unspecified: Secondary | ICD-10-CM | POA: Diagnosis not present

## 2024-08-17 DIAGNOSIS — R55 Syncope and collapse: Secondary | ICD-10-CM | POA: Diagnosis present

## 2024-08-17 DIAGNOSIS — R030 Elevated blood-pressure reading, without diagnosis of hypertension: Secondary | ICD-10-CM | POA: Diagnosis not present

## 2024-08-17 DIAGNOSIS — N189 Chronic kidney disease, unspecified: Secondary | ICD-10-CM

## 2024-08-17 DIAGNOSIS — Z96611 Presence of right artificial shoulder joint: Secondary | ICD-10-CM | POA: Diagnosis not present

## 2024-08-17 DIAGNOSIS — I5020 Unspecified systolic (congestive) heart failure: Secondary | ICD-10-CM | POA: Diagnosis not present

## 2024-08-17 DIAGNOSIS — K5900 Constipation, unspecified: Secondary | ICD-10-CM | POA: Diagnosis not present

## 2024-08-17 DIAGNOSIS — E43 Unspecified severe protein-calorie malnutrition: Secondary | ICD-10-CM | POA: Diagnosis not present

## 2024-08-17 DIAGNOSIS — R41841 Cognitive communication deficit: Secondary | ICD-10-CM | POA: Diagnosis not present

## 2024-08-17 DIAGNOSIS — I4891 Unspecified atrial fibrillation: Secondary | ICD-10-CM | POA: Diagnosis not present

## 2024-08-17 DIAGNOSIS — M545 Low back pain, unspecified: Secondary | ICD-10-CM | POA: Diagnosis not present

## 2024-08-17 DIAGNOSIS — R2689 Other abnormalities of gait and mobility: Secondary | ICD-10-CM | POA: Diagnosis not present

## 2024-08-17 DIAGNOSIS — Z7901 Long term (current) use of anticoagulants: Secondary | ICD-10-CM

## 2024-08-17 DIAGNOSIS — F32A Depression, unspecified: Secondary | ICD-10-CM | POA: Diagnosis not present

## 2024-08-17 DIAGNOSIS — N184 Chronic kidney disease, stage 4 (severe): Secondary | ICD-10-CM | POA: Diagnosis not present

## 2024-08-17 DIAGNOSIS — I5022 Chronic systolic (congestive) heart failure: Secondary | ICD-10-CM

## 2024-08-17 DIAGNOSIS — N179 Acute kidney failure, unspecified: Secondary | ICD-10-CM | POA: Diagnosis not present

## 2024-08-17 DIAGNOSIS — R531 Weakness: Secondary | ICD-10-CM | POA: Diagnosis not present

## 2024-08-17 LAB — CBC
HCT: 21.8 % — ABNORMAL LOW (ref 39.0–52.0)
Hemoglobin: 7.6 g/dL — ABNORMAL LOW (ref 13.0–17.0)
MCH: 32.9 pg (ref 26.0–34.0)
MCHC: 34.9 g/dL (ref 30.0–36.0)
MCV: 94.4 fL (ref 80.0–100.0)
Platelets: 141 K/uL — ABNORMAL LOW (ref 150–400)
RBC: 2.31 MIL/uL — ABNORMAL LOW (ref 4.22–5.81)
RDW: 14.8 % (ref 11.5–15.5)
WBC: 6.6 K/uL (ref 4.0–10.5)
nRBC: 0 % (ref 0.0–0.2)

## 2024-08-17 LAB — RENAL FUNCTION PANEL
Albumin: 2.7 g/dL — ABNORMAL LOW (ref 3.5–5.0)
Anion gap: 11 (ref 5–15)
BUN: 49 mg/dL — ABNORMAL HIGH (ref 8–23)
CO2: 24 mmol/L (ref 22–32)
Calcium: 8.1 mg/dL — ABNORMAL LOW (ref 8.9–10.3)
Chloride: 100 mmol/L (ref 98–111)
Creatinine, Ser: 4.63 mg/dL — ABNORMAL HIGH (ref 0.61–1.24)
GFR, Estimated: 12 mL/min — ABNORMAL LOW (ref >60)
Glucose, Bld: 107 mg/dL — ABNORMAL HIGH (ref 70–99)
Phosphorus: 5.7 mg/dL — ABNORMAL HIGH (ref 2.5–4.6)
Potassium: 4 mmol/L (ref 3.5–5.1)
Sodium: 135 mmol/L (ref 135–145)

## 2024-08-17 LAB — UPEP/UIFE/LIGHT CHAINS/TP, 24-HR UR
% BETA, Urine: 29.1 %
ALPHA 1 URINE: 6.1 %
Albumin, U: 16.1 %
Alpha 2, Urine: 32.6 %
Free Kappa Lt Chains,Ur: 113.4 mg/L — ABNORMAL HIGH (ref 1.17–86.46)
Free Kappa/Lambda Ratio: 3.12 (ref 1.83–14.26)
Free Lambda Lt Chains,Ur: 36.38 mg/L — ABNORMAL HIGH (ref 0.27–15.21)
GAMMA GLOBULIN URINE: 16 %
Total Protein, Urine-Ur/day: 16 mg/(24.h) — ABNORMAL LOW (ref 30–150)
Total Protein, Urine: 20.4 mg/dL
Total Volume: 80

## 2024-08-17 MED ORDER — DICLOFENAC SODIUM 1 % EX GEL: 4.0000 g | Freq: Four times a day (QID) | CUTANEOUS | Status: AC

## 2024-08-17 MED ORDER — BUPROPION HCL ER (SR) 150 MG PO TB12: 150.0000 mg | ORAL_TABLET | Freq: Every day | ORAL | Status: AC

## 2024-08-17 MED ORDER — SEVELAMER CARBONATE 800 MG PO TABS: 800.0000 mg | ORAL_TABLET | Freq: Three times a day (TID) | ORAL | Status: AC

## 2024-08-17 MED ORDER — ENSURE PLUS HIGH PROTEIN PO LIQD: 237.0000 mL | Freq: Two times a day (BID) | ORAL | Status: AC

## 2024-08-17 MED ORDER — ATORVASTATIN CALCIUM 80 MG PO TABS
80.0000 mg | ORAL_TABLET | Freq: Every day | ORAL | 11 refills | Status: DC
  Filled 2024-08-17: qty 30, 30d supply, fill #0

## 2024-08-17 NOTE — Progress Notes (Signed)
 Possibile d/c noted. Contacted pt out-pt HD clinic, [facility] SW to inform of pt d/c and anticipated arrival Friday for 1st appt as scheduled. AVS updated. No further support needed.   Connor Wilkerson Dialysis Nav [phone]

## 2024-08-17 NOTE — TOC Transition Note (Signed)
 Transition of Care [facility]) - Discharge Note   Patient Details  Name: Connor Wilkerson MRN: [id number] Date of Birth: 17-Jan-1941  [facility] CM/SW Contact:  Bridget Cordella Simmonds, LCSW Phone Number: 08/17/2024, 11:20 AM   Clinical Narrative:   PT discharging to [facility], room 105B. RN report to: [phone].   PTAR called [phone].    Final next level of care: Skilled Nursing Facility Barriers to Discharge: Barriers Resolved   Patient Goals and CMS Choice     Choice offered to / list presented to : Adult Children (daughter [name])      Discharge Placement              Patient chooses bed at: [facility] Patient to be transferred to facility by: ptar Name of family member notified: wife Holley, daughter [name] Patient and family notified of of transfer: 08/17/24  Discharge Plan and Services Additional resources added to the After Visit Summary for   In-house Referral: Clinical Social Work   Post Acute Care Choice: Skilled Nursing Facility                               Social Drivers of Health (SDOH) Interventions SDOH Screenings   Food Insecurity: No Food Insecurity (08/10/2024)  Housing: Low Risk  (08/10/2024)  Transportation Needs: No Transportation Needs (08/10/2024)  Utilities: Not At Risk (08/10/2024)  Social Connections: Patient Declined (08/10/2024)  Tobacco Use: Medium Risk (08/10/2024)     Readmission Risk Interventions     No data to display

## 2024-08-17 NOTE — Discharge Instructions (Addendum)
 Dear Connor Wilkerson,  It has been a pleasure caring for you during your hospital stay. You were admitted with weakness, confusion, and poor appetite, and we found that your kidney function had worsened. You received dialysis to help clear toxins from your blood, and your condition has improved. You are now stable to continue your recovery and dialysis at your skilled nursing facility (SNF).  1. Medications - Start: Sevelamer tablet 800 mg 3 times a day  - Continue: Eliquis  - Take 2.5 mg twice daily to prevent blood clots. Do not stop unless advised by your doctor. Wellbutrin  XL - Take 150 mg daily for mood. Gabapentin  - Take 300 mg twice daily for nerve and back pain (dose adjusted for your kidneys). Tramadol - Take only as needed for severe pain. Trazodone  - 50 mg at bedtime as needed for sleep. Amlodipine  -2.5 mg by mouth daily  - Hold: Entresto  until cleared by your kidney and heart doctors.   Bring all medication bottles or your updated medication list to all appointments, including dialysis and primary care visits.  2. Diet Follow a renal (kidney-friendly) diet: Limit salt, potassium, and phosphorus as advised. Eat small, frequent meals to maintain strength. Include lean protein sources (chicken, fish, eggs) as recommended. Continue Ensure Plus High Protein twice daily for nutrition. Stay well hydrated, but follow any fluid limits set by your dialysis team.  3. Activity Participate in physical and occupational therapy at the SNF to regain strength and balance. Use your walker or assistive devices when walking to prevent falls. Avoid lifting heavy objects or sudden movements until cleared by your therapist or doctor. Rest as needed--fatigue may still occur as your body recovers.  4. Dialysis - You are scheduled for outpatient hemodialysis (HD) on Monday, Wednesday, and Friday at 12:15 PM at the [facility]. Please arrive by 11:30 AM for your first session. -  First hemodialysis session is on Friday, 08/19/2024 - The SNF will help coordinate transportation to and from dialysis. - Report any symptoms immediately to your dialysis nurse or provider, including: Shortness of breath Swelling Dizziness Chest pain  5. Follow-Up Follow up with your primary care provider within 1-2 weeks. Continue regular visits with your nephrologist for dialysis management and medication adjustments. Notify your healthcare team if you develop new confusion, fever, bleeding, or worsening weakness.  6. General Notes Keep track of your daily weight (your dialysis team will also monitor this). Maintain good skin care, especially around your dialysis catheter site. Report any redness, drainage, or pain around the catheter immediately.  Connor Wilkerson, it has truly been a pleasure caring for you. We are glad to see your improvement and are confident you will continue to recover well with therapy and dialysis. Please take care, and don't hesitate to reach out to your care team with any concerns.  Warm regards, Your Hospital Care Team

## 2024-08-17 NOTE — Discharge Summary (Addendum)
 Name: Connor Wilkerson MRN: [id number] DOB: 10/17/1941 83 y.o. PCP: Keren Vicenta BRAVO, MD  Date of Admission: 08/10/2024  4:58 AM Date of Discharge: 08/17/2024 Attending Physician: Dr. Mliss Pouch  Discharge Diagnosis: 1. Principal Problem:   Acute renal failure superimposed on chronic kidney disease, on chronic dialysis (HCC) Active Problems:   Uremic encephalopathy   Ischemic cardiomyopathy   Atrial fibrillation (HCC)   Hypercoagulable state   Back pain   Macrocytic anemia   Thrombocytopenia   PAD (peripheral artery disease)   Protein-calorie malnutrition, severe   Anemia, chronic disease   Discharge Medications: Allergies as of 08/17/2024       Reactions   Tramadol Other (See Comments)   Unknown Reaction -  Unsure of reaction         Medication List     PAUSE taking these medications    Entresto  24-26 MG Wait to take this until your doctor or other care provider tells you to start again. Generic drug: sacubitril -valsartan  TAKE 1/2 TABLET TWICE A DAY BY MOUTH       STOP taking these medications    rosuvastatin  40 MG tablet Commonly known as: CRESTOR        TAKE these medications    acetaminophen  500 MG tablet Commonly known as: TYLENOL  Take 500-1,000 mg by mouth every 6 (six) hours as needed for moderate pain (pain score 4-6).   amLODipine  2.5 MG tablet Commonly known as: NORVASC  TAKE 2 TABLETS BY MOUTH DAILY What changed: how much to take   apixaban  2.5 MG Tabs tablet Commonly known as: Eliquis  Take 1 tablet (2.5 mg total) by mouth 2 (two) times daily.   atorvastatin 80 MG tablet Commonly known as: Lipitor Take 1 tablet (80 mg total) by mouth daily.   buPROPion  150 MG 12 hr tablet Commonly known as: WELLBUTRIN  SR Take 1 tablet (150 mg total) by mouth daily. Start taking on: August 18, 2024 What changed: how much to take   diclofenac Sodium 1 % Gel Commonly known as: VOLTAREN Apply 4 g topically 4 (four) times daily.    escitalopram  10 MG tablet Commonly known as: LEXAPRO  Take 10 mg by mouth daily.   famotidine  20 MG tablet Commonly known as: PEPCID  Take 20 mg by mouth 2 (two) times daily.   feeding supplement Liqd Take 237 mLs by mouth 2 (two) times daily between meals.   gabapentin  300 MG capsule Commonly known as: NEURONTIN  Take 300 mg by mouth at bedtime.   lidocaine  5 % Commonly known as: LIDODERM  Place 1 patch onto the skin daily as needed (Pain).   mupirocin  ointment 2 % Commonly known as: BACTROBAN  Apply 1 Application topically 2 (two) times daily. Head/Wrist   nitroGLYCERIN  0.4 MG SL tablet Commonly known as: NITROSTAT  Place 1 tablet (0.4 mg total) under the tongue every 5 (five) minutes as needed for chest pain.   sevelamer carbonate 800 MG tablet Commonly known as: RENVELA Take 1 tablet (800 mg total) by mouth 3 (three) times daily with meals.   traZODone  50 MG tablet Commonly known as: DESYREL  Take 50 mg by mouth at bedtime as needed for sleep.       Disposition and Follow-Up: Connor Wilkerson. Hyde was discharged from [facility] in stable condition.   - At the hospital follow-up visit, please address:  #AKI: Review ongoing renal function, dialysis schedule, fluid status, and medication adjustments (hold Entresto  until cleared).  #Confusion / #UremicEncephalopathy: Monitor mental status and cognitive function; assess for any  recurrence of confusion or delirium.  #Anemia: Check hemoglobin/hematocrit, iron, B12, folate; coordinate ESA therapy with nephrology.  #BackPain / #Neuropathy: Assess pain control, adjust medications as appropriate for renal function.  #Hypertension / #CardiacStatus: Monitor blood pressure, heart failure symptoms, and ICD function.  #Mood / #Sleep: Evaluate depression, sleep quality, and medication adherence.  - Labs / Imaging Needed at Time of Follow-Up: None function test, CBC  - Pending Labs / Tests Needing Follow-Up:  SPEP/UPEP for multiple myeloma workup  Follow-up Appointments:  Contact information for follow-up providers     Center, [facility] [place] Follow up on 08/19/2024.   Why: please arrive to 1st dialysis appointment at 11:30AM. Contact information: [address] [place] [address] [phone]         Keren Vicenta BRAVO, MD. Call today.   Specialty: Internal Medicine Why: Hospital follow-up Contact information: [address] [place] [address] [phone]              Contact information for after-discharge care     Destination     [facility] [place], [place] .   Service: Skilled Nursing Contact information: [address] [place]  [phone]                      Hospital Course by problem list  [name]. Olveda is an 83 year old gentleman with a past medical history of ischemic cardiomyopathy (EF 35% in 2019, improved to 55-60% by 2023), CAD with ICD, CKD stage III, hypertension, hyperlipidemia, and paroxysmal atrial fibrillation on Eliquis , who presented with progressive weakness, poor appetite, and diarrhea for several days. His wife also noted that he had become increasingly confused and less interactive over the week prior to admission  # Acute Kidney Injury on CKD complicated by uremic encephalopathy, requiring initiation of hemodialysis On admission, his creatinine was 11.8 mg/dL (baseline ~7.8-7.4) and BUN 142, consistent with severe AKI on CKD. Urinalysis revealed 1+ protein and 1+ blood without RBCs on microscopy; FeNa 5%, U/P ratio 0.9. Renal ultrasound and CT abdomen/pelvis ruled out obstruction. Myeloma work-up (SPEP, UPEP, and serum free light chains) was negative for multiple myeloma.  A temporary hemodialysis catheter was placed, and the patient underwent three HD sessions (10/11, 10/12, and 10/14).  He showed remarkable improvement in mentation and appetite after initiation  of dialysis, confirming uremic encephalopathy as the cause of his altered mental status.  His creatinine stabilized around 4.5-4.6 mg/dL after dialysis.  The etiology was most consistent with AKI superimposed on progressive CKD, likely due to volume depletion from diarrhea and poor intake, possibly worsened by nephrotoxic medications. Caveat - time to progression is unknown.   Held medications: Entresto  (not resumed at discharge)  Monitoring: Strict I/Os, daily weights, PVR checks, and avoidance of hypotension or nephrotoxins.  He remains oliguric and will continue on a hemodialysis schedule (TTS) as arranged with the [facility]). Social work confirmed SNF placement at [facility], which can coordinate transportation to and from dialysis.  # Uremic encephalopathy At presentation, Mr. Smock was somnolent and disoriented but exhibited no focal neurological deficits. His altered mental status was attributed to uremic encephalopathy secondary to severe azotemia (BUN 142, creatinine 10.8). The accumulation of uremic toxins, electrolyte imbalances, and metabolic acidosis likely contributed to his confusion and lethargy. There was no evidence of infection, hypoxia, hypoglycemia, or structural brain lesion to explain his symptoms. Following two sessions of hemodialysis, his mental clarity improved markedly--he became alert, interactive, oriented to  person and place, and conversational.  # Anemia of CKD Hemoglobin declined from 8.0 ? 6.7 g/dL during hospitalization. No source of bleeding. Iron, B12, and folate levels were normal, and occult blood test was negative. He received 1 unit of PRBC (10/14) with good response, and ESA therapy was initiated per nephrology for long-term management at the HD center.  # Chronic Systolic Heart Failure (Recovered EF) Most recent EF: 55-60% (2023). Clinically euvolemic with no edema or dyspnea. Entresto  and diuretics were discontinued.  No  ultrafiltration was performed during initial dialysis sessions due to stable volume status. ICD interrogation showed normal function.  # Chronic Back Pain and Spinal Stenosis MRI lumbar spine: moderate central stenosis (L3-L5) with right > left foraminal narrowing. Pain managed with diclofenac gel, lidocaine  patch, and renally dosed gabapentin ; tramadol PRN for severe pain. Avoided NSAIDs and sedatives. Pain and mobility improved with PT/OT.  # Hypertension Blood pressure remained stable (avg 130/60 mmHg). Entresto  and amlodipine  were held during the AKI phase. Plan to resume gentle BP control (amlodipine ).  # Paroxysmal Atrial Fibrillation Remained in sinus rhythm throughout hospitalization. Eliquis  2.5 mg BID resumed once renal function and hemoglobin stabilized.  # Rhabdomyolysis Concern CK mildly elevated (1210 ? peak 1719), likely due to dehydration and immobility. Crestor  discontinued; CK down-trended to 300. No myalgia or myoglobinuria discharge.  # Thrombocytopenia Mild and stable (platelets 110-125K), with normal morphology on smear. No bleeding episodes. Continue to monitor.  # Fall Risk, Debility, and Poor Oral Intake Likely related to uremia and deconditioning. Nutrition started renal diet with Ensure Plus High Protein BID. PT/OT worked on Geneticist, molecular; patient ambulated short distances with assistance. Planned transfer to SNF for continued rehab and HD coordination.  # Depression and Sleep Known depression on Wellbutrin  XL 150 mg daily. Continued trazodone  50 mg PRN and gabapentin  300 mg BID (renally dosed). Mood and sleep improved as uremia resolved.  # Current Status and Plan - As of 10/15, Mr. Formby is alert, interactive, and hemodynamically stable.  - Cr ~4.5-4.6, BUN 52, Hb improved post-transfusion, CK trending down, BP stable, euvolemic.  - Nephrology cleared for discharge to [facility] with scheduled hemodialysis at [facility] (TTS).  - Primary  Care follow-up: within 1-2 weeks.  - Nephrology follow-up: at dialysis center for ongoing management and ESA dosing.   Stable chronic medical conditions: Coronary artery disease Essential hypertension CKD stage V  Subjective:  Patient is doing well this morning and oriented to name. Wife brought up concerns about him becoming upset and restless last night after dialysis, trying to get up without a plan. He was confused briefly but recognized his wife and location; she was able to calm him and guide him back to bed. Patient denies headache, back pain, or shortness of breath; urination and bowel movements are normal.   Discharge Exam:   BP (!) 140/64 (BP Location: Right Arm)   Pulse 66   Temp 98 F (36.7 C)   Resp 17   Wt 68.6 kg   SpO2 98%   BMI 21.70 kg/m  Discharge exam:  General: The patient is resting comfortably on hemodialysis chair. Cardiovascular: Heart rate and rhythm are normal, with no murmurs  Pulmonary: Breathing comfortably and without effort,  with no wheezing or crackles noted. Abdomen: Soft to palpation, without tenderness, cachectic. Musculoskeletal: cachectic,No lower extremity edema. Neurological: The patient is oriented to self, date and place and his wife. He follows commands and communicates effectively. Surgical Site: Pooled blood around the [facility] under the  clear dressing. No active bleeding or discharge observed    Pertinent Labs, Studies, and Procedures:     Latest Ref Rng & Units 08/17/2024    4:57 AM 08/15/2024    8:33 PM 08/15/2024    6:15 AM  CBC  WBC 4.0 - 10.5 K/uL 6.6   7.0   Hemoglobin 13.0 - 17.0 g/dL 7.6  7.3  6.7   Hematocrit 39.0 - 52.0 % 21.8  21.7  19.9   Platelets 150 - 400 K/uL 141   123        Latest Ref Rng & Units 08/17/2024    4:39 AM 08/16/2024    4:35 AM 08/15/2024    6:15 AM  CMP  Glucose 70 - 99 mg/dL 892  887  884   BUN 8 - 23 mg/dL 49  91  78   Creatinine 0.61 - 1.24 mg/dL 5.36  2.43  3.04   Sodium 135 - 145  mmol/L 135  138  135   Potassium 3.5 - 5.1 mmol/L 4.0  4.8  4.1   Chloride 98 - 111 mmol/L 100  103  99   CO2 22 - 32 mmol/L 24  20  21    Calcium  8.9 - 10.3 mg/dL 8.1  8.1  7.9     CT ABDOMEN PELVIS WO CONTRAST Result Date: 08/10/2024 EXAM: CT ABDOMEN AND PELVIS WITHOUT CONTRAST 08/10/2024 10:44:36 AM TECHNIQUE: CT of the abdomen and pelvis was performed without the administration of intravenous contrast. Multiplanar reformatted images are provided for review. Automated exposure control, iterative reconstruction, and/or weight-based adjustment of the mA/kV was utilized to reduce the radiation dose to as low as reasonably achievable. COMPARISON: CT dated 03/23/23. CLINICAL HISTORY: Abdominal pain. FINDINGS: LOWER CHEST: Mild dependent atelectasis in the lower lobes. Aicd leads noted. LIVER: The liver is unremarkable. GALLBLADDER AND BILE DUCTS: Gallbladder is unremarkable. No biliary ductal dilatation. SPLEEN: No acute abnormality. PANCREAS: No acute abnormality. ADRENAL GLANDS: No acute abnormality. KIDNEYS, URETERS AND BLADDER: The small exophytic region of concern from the left kidney upper pole shown on prior exams is obscured by motion artifact on today's exams but appears roughly similar to previous. This lesion remains indeterminate. No stones in the kidneys or ureters. No hydronephrosis. No perinephric or periureteral stranding. Urinary bladder is unremarkable. GI AND BOWEL: Scattered sigmoid colon diverticula. Stomach demonstrates no acute abnormality. There is no bowel obstruction. PERITONEUM AND RETROPERITONEUM: No ascites. No free air. VASCULATURE: Systemic atherosclerosis is present, including the aorta and iliac arteries. Coronary and aortic atherosclerosis. LYMPH NODES: No lymphadenopathy. REPRODUCTIVE ORGANS: No acute abnormality. BONES AND SOFT TISSUES: Dextroconvex lumbar scoliosis with rotary component. Thoracic and lumbar spondylosis and degenerative disc disease with resulting moderate  right foraminal impingement at L2-L3, L3-L4, and L4-L5, and moderate left foraminal impingement at L2-L3. Mild grade 1 degenerative retrolisthesis at all levels between L1 and S1. Acetabular labral chondrocalcinosis bilaterally. Small left groin hernia contains adipose tissue. No acute osseous abnormality. No focal soft tissue abnormality. IMPRESSION: 1. Small exophytic lesion at the left renal upper pole remains indeterminate due to motion artifact. 2. No acute findings in the abdomen or pelvis. 3. Systemic atherosclerosis, including aorta and iliac arteries. 4. Coronary atherosclerosis. 5. Thoracic and lumbar spondylosis and degenerative disc disease with moderate right foraminal impingement at L3-L4 and L4-L5, and moderate left foraminal impingement at L2-L3. 6. Mild grade 1 degenerative retrolisthesis from L1 to S1. 7. Acetabular labral chondrocalcinosis bilaterally. 8. Dextroconvex lumbar scoliosis with rotary component. Electronically signed by: Ryan Salvage MD  08/10/2024 11:15 AM EDT RP Workstation: [id number]   DG Lumbar Spine Complete Result Date: 08/10/2024 EXAM: 5 VIEW(S) XRAY OF THE LUMBAR SPINE 08/10/2024 06:00:58 AM COMPARISON: CT lumbar myelogram dated 07/21/2024 and earlier studies. CLINICAL HISTORY: 83 year old male with lower back pain and weakness. FINDINGS: LUMBAR SPINE: BONES: Stable vertebral height and alignment including s shaped lumbar scoliosis and straightening of lordosis. No acute fracture. No aggressive appearing osseous lesion. DISCS AND DEGENERATIVE CHANGES: Chronic severe lumbar disc and endplate degeneration with diffuse vacuum disc and endplate spurring. SOFT TISSUES: Calcified abdominal aortic atherosclerosis. No acute abnormality. IMPRESSION: 1. No acute abnormality of the lumbar spine. 2. Chronic severe lumbar disc and endplate degeneration with underlying S-shaped lumbar scoliosis stable from CT myelogram last month. Electronically signed by: Helayne Hurst MD 08/10/2024  06:08 AM EDT RP Workstation: [id number]   DG Chest Port 1 View Result Date: 08/10/2024 EXAM: 1 VIEW(S) XRAY OF THE CHEST 08/10/2024 05:59:33 AM COMPARISON: CT chest, abdomen, and pelvis dated 09/28/2022 and earlier. CLINICAL HISTORY: 83 year old male with dyspnea on exertion. FINDINGS: LUNGS AND PLEURA: No focal pulmonary opacity. No pulmonary edema. No pleural effusion. No pneumothorax. HEART AND MEDIASTINUM: Stable left chest Automatic Implantable Cardioverter-Defibrillator (AICD). Chronic Coronary Artery Bypass Grafting (CABG) and sternotomy. Mediastinal contours are stable and within normal limits. BONES AND SOFT TISSUES: Right total shoulder arthroplasty is new from 2023. IMPRESSION: 1. No acute cardiopulmonary process. Electronically signed by: Helayne Hurst MD 08/10/2024 06:06 AM EDT RP Workstation: [id number]     Discharge Instructions: Discharge Instructions     Call MD for:  difficulty breathing, headache or visual disturbances   Complete by: As directed    Call MD for:  persistant dizziness or light-headedness   Complete by: As directed    Call MD for:  persistant nausea and vomiting   Complete by: As directed    Call MD for:  severe uncontrolled pain   Complete by: As directed    Diet - low sodium heart healthy   Complete by: As directed    Discharge instructions   Complete by: As directed    Dear Mr. Belluomini,  It has been a pleasure caring for you during your hospital stay. You were admitted with weakness, confusion, and poor appetite, and we found that your kidney function had worsened. You received dialysis to help clear toxins from your blood, and your condition has improved. You are now stable to continue your recovery and dialysis at your skilled nursing facility (SNF).  1. Medications - Start: Sevelamer tablet 800 mg 3 times a day  - Continue: Eliquis  - Take 2.5 mg twice daily to prevent blood clots. Do not stop unless advised by your doctor. Wellbutrin  XL - Take 150 mg  daily for mood. Gabapentin  - Take 300 mg twice daily for nerve and back pain (dose adjusted for your kidneys). Tramadol - Take only as needed for severe pain. Trazodone  - 50 mg at bedtime as needed for sleep. Amlodipine  -2.5 mg by mouth daily  - Hold: Entresto  until cleared by your kidney and heart doctors.   Bring all medication bottles or your updated medication list to all appointments, including dialysis and primary care visits.  2. Diet Follow a renal (kidney-friendly) diet: Limit salt, potassium, and phosphorus as advised. Eat small, frequent meals to maintain strength. Include lean protein sources (chicken, fish, eggs) as recommended. Continue Ensure Plus High Protein twice daily for nutrition. Stay well hydrated, but follow any fluid limits set by your dialysis team.  3. Activity Participate in physical and occupational therapy at the SNF to regain strength and balance. Use your walker or assistive devices when walking to prevent falls. Avoid lifting heavy objects or sudden movements until cleared by your therapist or doctor. Rest as needed-fatigue may still occur as your body recovers.  4. Dialysis - You are scheduled for outpatient hemodialysis (HD) on Monday, Wednesday, and Friday at 12:15 PM at the [facility]. Please arrive by 11:30 AM for your first session. - First hemodialysis session is on Friday, 08/19/2024 - The SNF will help coordinate transportation to and from dialysis. - Report any symptoms immediately to your dialysis nurse or provider, including: Shortness of breath Swelling Dizziness Chest pain  5. Follow-Up Follow up with your primary care provider within 1-2 weeks. Continue regular visits with your nephrologist for dialysis management and medication adjustments. Notify your healthcare team if you develop new confusion, fever, bleeding, or worsening weakness.  6. General Notes Keep track of your daily weight (your dialysis team will  also monitor this). Maintain good skin care, especially around your dialysis catheter site. Report any redness, drainage, or pain around the catheter immediately.  Mr. Mogan, it has truly been a pleasure caring for you. We are glad to see your improvement and are confident you will continue to recover well with therapy and dialysis. Please take care, and don't hesitate to reach out to your care team with any concerns.  Warm regards, Your Hospital Care Team   Increase activity slowly   Complete by: As directed    No wound care   Complete by: As directed        Signed: Trudy Mliss Dragon, MD 08/17/2024, 4:33 PM

## 2024-08-17 NOTE — Progress Notes (Signed)
 Patient ID: Connor Wilkerson, male   DOB: [birth year], 83 y.o.   MRN: [id number] S: No new complaints.  Unable to go to SNF yesterday.  He does not want to go to HD today but has agreed to go on Friday. O:BP (!) 140/64 (BP Location: Right Arm)   Pulse 66   Temp 98 F (36.7 C)   Resp 17   Wt 68.6 kg   SpO2 98%   BMI 21.70 kg/m   Intake/Output Summary (Last 24 hours) at 08/17/2024 0947 Last data filed at 08/17/2024 0500 Gross per 24 hour  Intake --  Output 300 ml  Net -300 ml   Intake/Output: I/O last 3 completed shifts: In: -  Out: 600 [Urine:600]  Intake/Output this shift:  No intake/output data recorded. Weight change:  Gen: NAD CVS: RRR Resp:CTA Abd: +BS, soft, NT/ND Ext: no edema  Recent Labs  Lab 08/12/24 1722 08/13/24 1207 08/13/24 1528 08/14/24 1846 08/15/24 0615 08/16/24 0435 08/17/24 0439  NA 139 136 137 135 135 138 135  K 3.7 3.8 4.1 3.9 4.1 4.8 4.0  CL 101 99 99 98 99 103 100  CO2 24 23 26 23  21* 20* 24  GLUCOSE 80 102* 103* 147* 115* 112* 107*  BUN 72* 49* 52* 66* 78* 91* 49*  CREATININE 6.64* 4.90* 5.31* 6.63* 6.95* 7.56* 4.63*  ALBUMIN  3.1* 3.3* 3.3* 3.0* 2.9* 2.8* 2.7*  CALCIUM  8.3* 8.4* 8.4* 7.9* 7.9* 8.1* 8.1*  PHOS 4.3 3.9 5.4* 6.7* 7.3* 7.5* 5.7*   Liver Function Tests: Recent Labs  Lab 08/15/24 0615 08/16/24 0435 08/17/24 0439  ALBUMIN  2.9* 2.8* 2.7*   No results for input(s): LIPASE, AMYLASE in the last 168 hours. No results for input(s): AMMONIA in the last 168 hours. CBC: Recent Labs  Lab 08/12/24 0400 08/13/24 0807 08/14/24 0218 08/15/24 0615 08/15/24 2033 08/17/24 0457  WBC 7.5 7.0 6.5 7.0  --  6.6  HGB 7.2* 7.3* 7.0* 6.7* 7.3* 7.6*  HCT 21.4* 21.9* 20.9* 19.9* 21.7* 21.8*  MCV 96.8 97.8 98.1 98.5  --  94.4  PLT 119* 122* 111* 123*  --  141*   Cardiac Enzymes: Recent Labs  Lab 08/13/24 0807 08/14/24 0218 08/15/24 0615 08/16/24 0435  CKTOTAL 1,719* 1,115* 523* 359   CBG: Recent Labs  Lab 08/10/24 1338   GLUCAP 112*    Iron Studies: No results for input(s): IRON, TIBC, TRANSFERRIN, FERRITIN in the last 72 hours. Studies/Results: No results found.  acetaminophen   1,000 mg Oral Q6H   apixaban   2.5 mg Oral BID   buPROPion   150 mg Oral Daily   Chlorhexidine  Gluconate Cloth  6 each Topical Q0600   darbepoetin (ARANESP) injection - DIALYSIS  100 mcg Subcutaneous Q Sat-1800   diclofenac Sodium  4 g Topical QID   feeding supplement  237 mL Oral BID BM   gabapentin   300 mg Oral QHS   lidocaine   1 patch Transdermal Q24H   senna-docusate  2 tablet Oral QHS   sevelamer carbonate  800 mg Oral TID WC    BMET    Component Value Date/Time   NA 135 08/17/2024 0439   NA 142 07/23/2022 1430   K 4.0 08/17/2024 0439   CL 100 08/17/2024 0439   CO2 24 08/17/2024 0439   GLUCOSE 107 (H) 08/17/2024 0439   BUN 49 (H) 08/17/2024 0439   BUN 24 07/23/2022 1430   CREATININE 4.63 (H) 08/17/2024 0439   CREATININE 2.02 (H) 10/23/2016 1139   CALCIUM  8.1 (L) 08/17/2024  0439   GFRNONAA 12 (L) 08/17/2024 0439   GFRAA 44 (L) 02/29/2020 1534   CBC    Component Value Date/Time   WBC 6.6 08/17/2024 0457   RBC 2.31 (L) 08/17/2024 0457   HGB 7.6 (L) 08/17/2024 0457   HGB 12.5 (L) 07/23/2022 1430   HCT 21.8 (L) 08/17/2024 0457   HCT 36.6 (L) 07/23/2022 1430   PLT 141 (L) 08/17/2024 0457   PLT 177 07/23/2022 1430   MCV 94.4 08/17/2024 0457   MCV 99 (H) 07/23/2022 1430   MCH 32.9 08/17/2024 0457   MCHC 34.9 08/17/2024 0457   RDW 14.8 08/17/2024 0457   RDW 12.4 07/23/2022 1430   LYMPHSABS 1.1 08/10/2024 0537   LYMPHSABS 2.1 01/04/2018 1432   MONOABS 0.8 08/10/2024 0537   EOSABS 0.1 08/10/2024 0537   EOSABS 0.2 01/04/2018 1432   BASOSABS 0.1 08/10/2024 0537   BASOSABS 0.0 01/04/2018 1432    Assessment/Plan KEVONTAY BURKS is an 83 y.o. male with HFrEF (last TTE 2019 EF 35%), CAD, ICD, HTN, HL, A fib presenting with weakness and diarrhea x 2 weeks who is found to have severe AKI on CKD for  which nephrology is consulted.    **Severe AKI on CKD 4: No recent labs - 10/2023 Cr 2.1 which had been baseline.  Presented with 3 wk history of symptoms - unclear if this is prerenal state leading to AKI on CKD vs progressive CKD presenting with uremic symptoms.  UA dip +bld, 1+ protein but no RBC on microscopy (CK ~1000  holding statin), FeNa 5%, U/P 0.9, Myeloma w/u SFLC ok but SPEP and UPEP pending.  CT a/p no obstruction.  With mild thrombocytopenia smear checked - no schistocytes.  Holding ARB.   Did not have substantial improvement with hydration and with likely uremic symptoms - recommended dialysis.   -HD cath and #1 treatment 10/11, #2 10/11 - terminated early due to back pain.  Has had remarkable improvement supporting his presentation was uremia.  For now he remains oliguric and will plan for next dialysis today for 3rd session (rolled over from yesterday due to high hospital census)- Will keep on TTS schedule for now.  CLIP process underway but unfortunately [facility] is at capacity and nearest would be [facility].  He was accepted at [facility] and will be going to SNF at [facility] who can transport to and from HD.   Avoid extremes of BP and nephrotoxins.  Strict I/Os, daily weights.   - He is stable for discharge to SNF today and follow up at [facility] on Friday (he already had 3 HD sessions in the last 4 days and had HD yesterday.   **Back pain: chronic just worse in bed and limiting dialysis per above; request primary team address.  Will do HD in chair today.   **HFrEF:  last known EF 35%. appearing euvolemic after hydration.  Hold entresto  in setting of severe AKI.   No UF with HD for now.   **HTN: holding entresto .  BP ok currently. Continue amlodipine  but hold prior to HD sessions on MWF.   **HL:  CK mildly elevated and with AKI holding statin.   **thrombocytopenia:  mild, smear normal plt morphology - stable in 110-120s, CTM   **Anemia:  macrocytic, iron, B12 folate all ok.  Pending SPEP, UPEP, SFLC - 1.2.  Hemoccult neg. Transfuse < 7.  Started ESA which will be managed at the kidney center.    **Falls/debility: likely uremia contributing, other w/u per primary.  Needs PT/OT  Fairy RONAL Sellar, MD [facility]

## 2024-08-17 NOTE — Discharge Planning (Addendum)
 [facility]  Initial Hemodialysis Orders  UPDATED ORDERS, REVIEW THIS!!!!!! I ONLY CHANGED THE TX TIME!!!!!  Dialysis center: [facility]  Patient's name: Connor Wilkerson DOB: 1941/07/22 AKI or ESRD: AKI  Past Medical History: HFrEF (last TTE 2019 EF 35%), CAD, ICD, HTN, HL, A fib   Baseline Cr: 2.1  Presented with 3 wk history of symptoms - unclear if this is prerenal state leading to AKI on CKD vs progressive CKD presenting with uremic symptoms. UA dip +bld, 1+ protein but no RBC on microscopy (CK ~1000 holding statin), FeNa 5%, U/P 0.9, Myeloma w/u SFLC ok but SPEP and UPEP pending. CT a/p no obstruction. With mild thrombocytopenia smear checked - no schistocytes. Holding ARB. Did not have substantial improvement with hydration and with likely uremic symptoms - recommended dialysis.   Discharge diagnosis: AKI on CKD - HD started HFrEF. Holding Entesto in setting of severe AKI Thrombocytopenia   Allergies:  Allergies  Allergen Reactions   Tramadol Other (See Comments)    Unknown Reaction -  Unsure of reaction     Date of First Dialysis: 08/13/24  Dialysis Prescription: Dialysis Frequency: MWF Tx duration: 3.5hrs   BFR: 400  DFR: 600 EDW: 68.6kg  Dialyzer: 180NRe UF profile/Sodium modeling?: None Dialysis Bath: 2 K/2 Ca  Dialysis access: Access type: [facility]    Date placed: 08/12/24  Surgeon: IR  In Center Medications: Heparin  Dose: Hold heparin  dose for now VDRA: Calcitriol: Per protocol Venofer: Per protocol Aranesp 100mcg given on 08/13/24. Change to Mircera: 150mcg Q2 weeks. Next dose due: 08/20/24 Continue Renvela 1 tab with meals S/p 1 unit PRBCs given on 08/15/24  Discharge labs:  Hgb: 7.6  K+: 4.0  Ca: 8.1  Phos: 5.7  Alb: 2.7  Please draw routine labs.   Charmaine Piety, NP

## 2024-08-17 NOTE — TOC Progression Note (Signed)
 Transition of Care [facility]) - Progression Note    Patient Details  Name: MEHTAAB MAYEDA MRN: [id number] Date of Birth: 08-15-1941  Transition of Care [facility]) CM/SW Contact  Bridget Cordella Simmonds, LCSW Phone Number: 08/17/2024, 9:58 AM  Clinical Narrative:   CSW confirmed with [facility] that they can receive pt today.  MD aware.     Expected Discharge Plan: Skilled Nursing Facility Barriers to Discharge: Continued Medical Work up, SNF Pending bed offer               Expected Discharge Plan and Services In-house Referral: Clinical Social Work   Post Acute Care Choice: Skilled Nursing Facility Living arrangements for the past 2 months: Single Family Home                                       Social Drivers of Health (SDOH) Interventions SDOH Screenings   Food Insecurity: No Food Insecurity (08/10/2024)  Housing: Low Risk  (08/10/2024)  Transportation Needs: No Transportation Needs (08/10/2024)  Utilities: Not At Risk (08/10/2024)  Social Connections: Patient Declined (08/10/2024)  Tobacco Use: Medium Risk (08/10/2024)    Readmission Risk Interventions     No data to display

## 2024-08-17 NOTE — Progress Notes (Signed)
 New Dialysis Start (AKI)   Patient identified as new dialysis start. Kidney Education packet assembled and given. Discussed the following items with patient:     Current medications and possible changes once started:  Discussed that patient's medications may change over time.  Ex; hypertension medications and diabetes medication.  Nephrologists will adjust as needed.   Fluid restrictions reviewed:  32 oz daily goal:  All liquids count; soups, ice, jello, fruits. Will also refer dietitian.   Phosphorus and potassium: Handout given showing high potassium and phosphorus foods.  Alternative food and drink options given. Will also refer dietitian.   Family support:  None at bedside   Outpatient Clinic Resources:  Discussed roles of Outpatient clinic staff and advised to make a list of needs, if any, to talk with outpatient staff if needed.   Care plan schedule: Informed patient of Care Plans in outpatient setting and to participate in the care plan.  An invitation would be given from outpatient clinic.    Dialysis Access Options:  If dialysis catheter present, educated that patient could not take showers.  Catheter dressing changes were to be done by outpatient clinic staff only.     Patient declined to engage in discussion regarding topics. Explained what was in education packet and placed on patients counter where requested. Patient wanted to get dressed, expressed to patient that he would have transportation coming to take him to SNF and I was unsure of departure time so this RN would pass along information to floor RN, patient grateful. Will continue to round on patient during admission as needed.    Alfonso Sar Dialysis Nurse Coordinator [phone]

## 2024-08-18 DIAGNOSIS — I4891 Unspecified atrial fibrillation: Secondary | ICD-10-CM | POA: Diagnosis not present

## 2024-08-18 DIAGNOSIS — E785 Hyperlipidemia, unspecified: Secondary | ICD-10-CM | POA: Diagnosis not present

## 2024-08-18 DIAGNOSIS — F411 Generalized anxiety disorder: Secondary | ICD-10-CM | POA: Diagnosis not present

## 2024-08-18 DIAGNOSIS — F32A Depression, unspecified: Secondary | ICD-10-CM | POA: Diagnosis not present

## 2024-08-19 DIAGNOSIS — N186 End stage renal disease: Secondary | ICD-10-CM | POA: Diagnosis not present

## 2024-08-19 DIAGNOSIS — M6282 Rhabdomyolysis: Secondary | ICD-10-CM | POA: Diagnosis not present

## 2024-08-19 DIAGNOSIS — N179 Acute kidney failure, unspecified: Secondary | ICD-10-CM | POA: Diagnosis not present

## 2024-08-19 DIAGNOSIS — Z992 Dependence on renal dialysis: Secondary | ICD-10-CM | POA: Diagnosis not present

## 2024-08-19 DIAGNOSIS — R030 Elevated blood-pressure reading, without diagnosis of hypertension: Secondary | ICD-10-CM | POA: Diagnosis not present

## 2024-08-19 DIAGNOSIS — E785 Hyperlipidemia, unspecified: Secondary | ICD-10-CM | POA: Diagnosis not present

## 2024-08-19 DIAGNOSIS — I4891 Unspecified atrial fibrillation: Secondary | ICD-10-CM | POA: Diagnosis not present

## 2024-08-19 DIAGNOSIS — I739 Peripheral vascular disease, unspecified: Secondary | ICD-10-CM | POA: Diagnosis not present

## 2024-08-19 NOTE — TOC Transition Note (Signed)
 Transition of Care - Initial Contact after Hospitalization  Date of discharge: 08/17/2024  Date of contact: 08/19/24  Method: Phone Spoke to: Patient's wife  Spoke to patient's wife to discuss transition of care from recent inpatient hospitalization. Patient was admitted to [facility] from 10/8 to 10/15 discharge diagnosis of AKI on CKD.  Patient's wife informed me of patient still being in the hospital. I noted discharge on 10/15 and didn't see any documentation of any recent re-admit from [facility] or in Care Everywhere. I discussed with our renal navigator who confirmed of patient currently receiving HD at [facility] right now.  Also confirmed with Dr. Rayburn of patient being discharged to a SNF.  Getting dialysis in outpatient now, next HD on 08/22/24.   Charmaine Piety, NP

## 2024-08-22 ENCOUNTER — Emergency Department (HOSPITAL_COMMUNITY)
Admission: EM | Admit: 2024-08-22 | Discharge: 2024-08-22 | Disposition: A | Discharge status: Home / Self Care | Attending: Emergency Medicine | Admitting: Emergency Medicine

## 2024-08-22 ENCOUNTER — Other Ambulatory Visit: Payer: Self-pay

## 2024-08-22 DIAGNOSIS — Z7901 Long term (current) use of anticoagulants: Secondary | ICD-10-CM | POA: Insufficient documentation

## 2024-08-22 DIAGNOSIS — R55 Syncope and collapse: Secondary | ICD-10-CM | POA: Insufficient documentation

## 2024-08-22 DIAGNOSIS — N179 Acute kidney failure, unspecified: Secondary | ICD-10-CM | POA: Diagnosis not present

## 2024-08-22 DIAGNOSIS — Z992 Dependence on renal dialysis: Secondary | ICD-10-CM | POA: Diagnosis not present

## 2024-08-22 LAB — COMPREHENSIVE METABOLIC PANEL WITH GFR
ALT: 10 U/L (ref 0–44)
AST: 22 U/L (ref 15–41)
Albumin: 3 g/dL — ABNORMAL LOW (ref 3.5–5.0)
Alkaline Phosphatase: 54 U/L (ref 38–126)
Anion gap: 12 (ref 5–15)
BUN: 28 mg/dL — ABNORMAL HIGH (ref 8–23)
CO2: 28 mmol/L (ref 22–32)
Calcium: 8.1 mg/dL — ABNORMAL LOW (ref 8.9–10.3)
Chloride: 99 mmol/L (ref 98–111)
Creatinine, Ser: 3.37 mg/dL — ABNORMAL HIGH (ref 0.61–1.24)
GFR, Estimated: 17 mL/min — ABNORMAL LOW (ref >60)
Glucose, Bld: 121 mg/dL — ABNORMAL HIGH (ref 70–99)
Potassium: 3.6 mmol/L (ref 3.5–5.1)
Sodium: 139 mmol/L (ref 135–145)
Total Bilirubin: 0.5 mg/dL (ref 0.0–1.2)
Total Protein: 5.9 g/dL — ABNORMAL LOW (ref 6.5–8.1)

## 2024-08-22 LAB — URINALYSIS, ROUTINE W REFLEX MICROSCOPIC
Bacteria, UA: NONE SEEN
Bilirubin Urine: NEGATIVE
Glucose, UA: 150 mg/dL — AB
Hgb urine dipstick: NEGATIVE
Ketones, ur: NEGATIVE mg/dL
Leukocytes,Ua: NEGATIVE
Nitrite: NEGATIVE
Protein, ur: 100 mg/dL — AB
Specific Gravity, Urine: 1.01 (ref 1.005–1.030)
pH: 8 (ref 5.0–8.0)

## 2024-08-22 LAB — CBC
HCT: 24.8 % — ABNORMAL LOW (ref 39.0–52.0)
Hemoglobin: 8 g/dL — ABNORMAL LOW (ref 13.0–17.0)
MCH: 32.5 pg (ref 26.0–34.0)
MCHC: 32.3 g/dL (ref 30.0–36.0)
MCV: 100.8 fL — ABNORMAL HIGH (ref 80.0–100.0)
Platelets: 200 K/uL (ref 150–400)
RBC: 2.46 MIL/uL — ABNORMAL LOW (ref 4.22–5.81)
RDW: 15.9 % — ABNORMAL HIGH (ref 11.5–15.5)
WBC: 6.7 K/uL (ref 4.0–10.5)
nRBC: 0 % (ref 0.0–0.2)

## 2024-08-22 LAB — CBG MONITORING, ED: Glucose-Capillary: 115 mg/dL — ABNORMAL HIGH (ref 70–99)

## 2024-08-22 NOTE — ED Notes (Signed)
 [facility] here to transport patient. Attempted to call report however I was told that RN on duty was on lunch and no one else was able to get report. Patient stable at time to discharge.

## 2024-08-22 NOTE — Discharge Instructions (Signed)
 Your labs here look good and it is reassuring that you feel better.  I have placed an order for the cardiology clinic to call you to try and set up an appointment.  Please return for repeat event.

## 2024-08-22 NOTE — ED Provider Triage Note (Signed)
 Emergency Medicine Provider Triage Evaluation Note  Connor Wilkerson , a 82 y.o. male  was evaluated in triage.  Pt states that he does not think he lost consciousness.  Patient states that he finished dialysis got up and sat in the chair.  Subsequent bent over to tie his shoes and subsequently he felt like a bite started rushing around him.  He does not remember any kind of loss of consciousness.  Denies all complaints.  No chest pain or shortness breath.  No nausea or diarrhea..  Review of Systems  Positive: No complaints  Negative: CP  Physical Exam  BP (!) 148/72 (BP Location: Left Arm)   Pulse 70   Temp 98.2 F (36.8 C) (Oral)   Resp 18   Ht 5' 10 (1.778 m)   Wt 68.6 kg   SpO2 100%   BMI 21.70 kg/m  Gen:   Awake, no distress   Resp:  Normal effort  MSK:   Moves extremities without difficulty  Other:    Medical Decision Making  Medically screening exam initiated at 7:38 PM.  Appropriate orders placed.  Connor Wilkerson was informed that the remainder of the evaluation will be completed by another provider, this initial triage assessment does not replace that evaluation, and the importance of remaining in the ED until their evaluation is complete.  Basic screening labs EKG.  No indication for imaging at this time.  Neuro intact.   Simon Lavonia SAILOR, MD 08/22/24 [phone]

## 2024-08-22 NOTE — ED Provider Notes (Signed)
 [place] EMERGENCY DEPARTMENT AT [facility] Provider Note   CSN: [id number] Arrival date & time: 08/22/24  1645     Patient presents with: Loss of Consciousness   Connor Wilkerson is a 83 y.o. male.   83 yo M with a chief complaints of possible syncopal event.  Per report the patient had allegedly passed out for couple minutes.  He tells me that he finished dialysis and was being pushed out of the clinic and reached over to grab his shoelaces.  He said that multiple people came and tried to push him back up and told him that he had passed out.  He said he never lost consciousness.  No chest pain no difficulty breathing no cough or congestion.  He felt fine today otherwise.  Just wants to go home and be with his wife.   Loss of Consciousness      Prior to Admission medications   Medication Sig Start Date End Date Taking? Authorizing Provider  acetaminophen  (TYLENOL ) 500 MG tablet Take 500-1,000 mg by mouth every 6 (six) hours as needed for moderate pain (pain score 4-6).    [provider]  amLODipine  (NORVASC ) 2.5 MG tablet TAKE 2 TABLETS BY MOUTH DAILY Patient taking differently: Take 2.5 mg by mouth daily. 03/23/24   Fernande Elspeth BROCKS, MD  apixaban  (ELIQUIS ) 2.5 MG TABS tablet Take 1 tablet (2.5 mg total) by mouth 2 (two) times daily. 06/16/24   Court Dorn JINNY, MD  atorvastatin (LIPITOR) 80 MG tablet Take 1 tablet (80 mg total) by mouth daily. 08/17/24 08/17/25  Trudy Mliss Dragon, MD  buPROPion  (WELLBUTRIN  SR) 150 MG 12 hr tablet Take 1 tablet (150 mg total) by mouth daily. 08/18/24   Azadegan, Maryam, MD  diclofenac Sodium (VOLTAREN) 1 % GEL Apply 4 g topically 4 (four) times daily. 08/17/24   Azadegan, Maryam, MD  escitalopram  (LEXAPRO ) 10 MG tablet Take 10 mg by mouth daily.    [provider]  famotidine  (PEPCID ) 20 MG tablet Take 20 mg by mouth 2 (two) times daily.    [provider]  feeding supplement (ENSURE PLUS HIGH  PROTEIN) LIQD Take 237 mLs by mouth 2 (two) times daily between meals. 08/17/24   Azadegan, Maryam, MD  gabapentin  (NEURONTIN ) 300 MG capsule Take 300 mg by mouth at bedtime.    [provider]  lidocaine  (LIDODERM ) 5 % Place 1 patch onto the skin daily as needed (Pain). 07/13/24   [provider]  mupirocin  ointment (BACTROBAN ) 2 % Apply 1 Application topically 2 (two) times daily. Head/Wrist 08/01/24   [provider]  nitroGLYCERIN  (NITROSTAT ) 0.4 MG SL tablet Place 1 tablet (0.4 mg total) under the tongue every 5 (five) minutes as needed for chest pain. 12/11/22   Fernande Elspeth BROCKS, MD  sacubitril -valsartan  (ENTRESTO ) 24-26 MG TAKE 1/2 TABLET TWICE A DAY BY MOUTH 05/03/24   Court Dorn JINNY, MD  sevelamer carbonate (RENVELA) 800 MG tablet Take 1 tablet (800 mg total) by mouth 3 (three) times daily with meals. 08/17/24   Azadegan, Maryam, MD  traZODone  (DESYREL ) 50 MG tablet Take 50 mg by mouth at bedtime as needed for sleep. 12/21/19   [provider]    Allergies: Tramadol    Review of Systems  Cardiovascular:  Positive for syncope.    Updated Vital Signs BP 132/74   Pulse 71   Temp 98.7 F (37.1 C) (Oral)   Resp 15   Ht 5' 10 (1.778 m)  Wt 68.6 kg   SpO2 99%   BMI 21.70 kg/m   Physical Exam Vitals and nursing note reviewed.  Constitutional:      Appearance: He is well-developed.  HENT:     Head: Normocephalic and atraumatic.  Eyes:     Pupils: Pupils are equal, round, and reactive to light.  Neck:     Vascular: No JVD.  Cardiovascular:     Rate and Rhythm: Normal rate and regular rhythm.     Heart sounds: No murmur heard.    No friction rub. No gallop.  Pulmonary:     Effort: No respiratory distress.     Breath sounds: No wheezing.  Abdominal:     General: There is no distension.     Tenderness: There is no abdominal tenderness. There is no guarding or rebound.  Musculoskeletal:        General: Normal range of motion.     Cervical  back: Normal range of motion and neck supple.  Skin:    Coloration: Skin is not pale.     Findings: No rash.  Neurological:     Mental Status: He is alert and oriented to person, place, and time.  Psychiatric:        Behavior: Behavior normal.     (all labs ordered are listed, but only abnormal results are displayed) Labs Reviewed  COMPREHENSIVE METABOLIC PANEL WITH GFR - Abnormal; Notable for the following components:      Result Value   Glucose, Bld 121 (*)    BUN 28 (*)    Creatinine, Ser 3.37 (*)    Calcium  8.1 (*)    Total Protein 5.9 (*)    Albumin  3.0 (*)    GFR, Estimated 17 (*)    All other components within normal limits  CBC - Abnormal; Notable for the following components:   RBC 2.46 (*)    Hemoglobin 8.0 (*)    HCT 24.8 (*)    MCV 100.8 (*)    RDW 15.9 (*)    All other components within normal limits  URINALYSIS, ROUTINE W REFLEX MICROSCOPIC - Abnormal; Notable for the following components:   Glucose, UA 150 (*)    Protein, ur 100 (*)    All other components within normal limits  CBG MONITORING, ED - Abnormal; Notable for the following components:   Glucose-Capillary 115 (*)    All other components within normal limits    EKG: EKG Interpretation Date/Time:  Monday August 22 2024 17:59:50 EDT Ventricular Rate:  71 PR Interval:  170 QRS Duration:  90 QT Interval:  402 QTC Calculation: 436 R Axis:   47  Text Interpretation: Normal sinus rhythm Nonspecific ST and T wave abnormality Abnormal ECG No significant change since last tracing Confirmed by Emil Share [phone]) on 08/22/2024 8:59:06 PM  Radiology: No results found.   Procedures   Medications Ordered in the ED - No data to display                                  Medical Decision Making Amount and/or Complexity of Data Reviewed Labs: ordered.   83 yo M with a chief complaint of a possible syncopal event.  Patient denies that this ever happened.  [place] he was made to come here.  Finished  dialysis and was trying to tie his shoe and this happened.  No acute anemia no significant electrolyte abnormalities.  EKG without  obvious ectopy.  I did offer pacemaker interrogation.  He is declining and would like to go home.  Discussed limitations of this and possibility that he may have had a concerning arrhythmia.  He plans to follow-up with his cardiologist.  9:19 PM:  I have discussed the diagnosis/risks/treatment options with the patient.  Evaluation and diagnostic testing in the emergency department does not suggest an emergent condition requiring admission or immediate intervention beyond what has been performed at this time.  They will follow up with Cards. We also discussed returning to the ED immediately if new or worsening sx occur. We discussed the sx which are most concerning (e.g., sudden worsening pain, fever, inability to tolerate by mouth, syncope, chest pain, difficulty breathing, at any time he wants to be reevaluated) that necessitate immediate return. Medications administered to the patient during their visit and any new prescriptions provided to the patient are listed below.  Medications given during this visit Medications - No data to display   The patient appears reasonably screen and/or stabilized for discharge and I doubt any other medical condition or other [facility] requiring further screening, evaluation, or treatment in the ED at this time prior to discharge.         Final diagnoses:  Syncope and collapse    ED Discharge Orders          Ordered    Ambulatory referral to Cardiology       Comments: If you have not heard from the Cardiology office within the next 72 hours please call [phone].   08/22/24 2105               Emil Share, DO 08/22/24 2119

## 2024-08-22 NOTE — ED Triage Notes (Signed)
 Patient arrives via [facility] from dialysis for syncope. Patient lives at [place]. Recent dc from here and just began dialysis. Patient had two minute long syncopal episodes. Patient did not hit head. No injury. [facility] refused patient back until ED eval. No complaints  EMS vitals  BP 136/80 HR 70 NSR CBG 147 97 on room air

## 2024-08-24 DIAGNOSIS — Z992 Dependence on renal dialysis: Secondary | ICD-10-CM | POA: Diagnosis not present

## 2024-08-24 DIAGNOSIS — I739 Peripheral vascular disease, unspecified: Secondary | ICD-10-CM | POA: Diagnosis not present

## 2024-08-24 DIAGNOSIS — N186 End stage renal disease: Secondary | ICD-10-CM | POA: Diagnosis not present

## 2024-08-24 DIAGNOSIS — I4891 Unspecified atrial fibrillation: Secondary | ICD-10-CM | POA: Diagnosis not present

## 2024-08-24 DIAGNOSIS — I5022 Chronic systolic (congestive) heart failure: Secondary | ICD-10-CM | POA: Diagnosis not present

## 2024-08-24 DIAGNOSIS — M6281 Muscle weakness (generalized): Secondary | ICD-10-CM | POA: Diagnosis not present

## 2024-08-24 DIAGNOSIS — F32A Depression, unspecified: Secondary | ICD-10-CM | POA: Diagnosis not present

## 2024-08-24 DIAGNOSIS — R2689 Other abnormalities of gait and mobility: Secondary | ICD-10-CM | POA: Diagnosis not present

## 2024-08-24 DIAGNOSIS — E43 Unspecified severe protein-calorie malnutrition: Secondary | ICD-10-CM | POA: Diagnosis not present

## 2024-08-24 DIAGNOSIS — R55 Syncope and collapse: Secondary | ICD-10-CM | POA: Diagnosis not present

## 2024-08-25 DIAGNOSIS — N179 Acute kidney failure, unspecified: Secondary | ICD-10-CM | POA: Diagnosis not present

## 2024-08-25 DIAGNOSIS — Z992 Dependence on renal dialysis: Secondary | ICD-10-CM | POA: Diagnosis not present

## 2024-08-26 DIAGNOSIS — F411 Generalized anxiety disorder: Secondary | ICD-10-CM | POA: Diagnosis not present

## 2024-08-26 DIAGNOSIS — Z992 Dependence on renal dialysis: Secondary | ICD-10-CM | POA: Diagnosis not present

## 2024-08-26 DIAGNOSIS — E785 Hyperlipidemia, unspecified: Secondary | ICD-10-CM | POA: Diagnosis not present

## 2024-08-26 DIAGNOSIS — N179 Acute kidney failure, unspecified: Secondary | ICD-10-CM | POA: Diagnosis not present

## 2024-08-26 DIAGNOSIS — F32A Depression, unspecified: Secondary | ICD-10-CM | POA: Diagnosis not present

## 2024-08-29 DIAGNOSIS — K5900 Constipation, unspecified: Secondary | ICD-10-CM | POA: Diagnosis not present

## 2024-08-31 ENCOUNTER — Encounter (HOSPITAL_COMMUNITY): Payer: Self-pay | Admitting: Emergency Medicine

## 2024-08-31 ENCOUNTER — Emergency Department (HOSPITAL_COMMUNITY)

## 2024-08-31 ENCOUNTER — Other Ambulatory Visit: Payer: Self-pay

## 2024-08-31 ENCOUNTER — Emergency Department (HOSPITAL_COMMUNITY)
Admission: EM | Admit: 2024-08-31 | Discharge: 2024-08-31 | Disposition: A | Discharge status: Skilled Nursing Facility | Attending: Emergency Medicine | Admitting: Emergency Medicine

## 2024-08-31 DIAGNOSIS — K5900 Constipation, unspecified: Secondary | ICD-10-CM | POA: Insufficient documentation

## 2024-08-31 DIAGNOSIS — R2689 Other abnormalities of gait and mobility: Secondary | ICD-10-CM | POA: Diagnosis not present

## 2024-08-31 DIAGNOSIS — I4891 Unspecified atrial fibrillation: Secondary | ICD-10-CM | POA: Diagnosis not present

## 2024-08-31 DIAGNOSIS — R0602 Shortness of breath: Secondary | ICD-10-CM | POA: Diagnosis present

## 2024-08-31 DIAGNOSIS — Z7901 Long term (current) use of anticoagulants: Secondary | ICD-10-CM | POA: Diagnosis not present

## 2024-08-31 DIAGNOSIS — F32A Depression, unspecified: Secondary | ICD-10-CM | POA: Diagnosis not present

## 2024-08-31 DIAGNOSIS — M6281 Muscle weakness (generalized): Secondary | ICD-10-CM | POA: Diagnosis not present

## 2024-08-31 DIAGNOSIS — Z992 Dependence on renal dialysis: Secondary | ICD-10-CM | POA: Diagnosis not present

## 2024-08-31 DIAGNOSIS — E43 Unspecified severe protein-calorie malnutrition: Secondary | ICD-10-CM | POA: Diagnosis not present

## 2024-08-31 DIAGNOSIS — I5022 Chronic systolic (congestive) heart failure: Secondary | ICD-10-CM | POA: Diagnosis not present

## 2024-08-31 DIAGNOSIS — Z96611 Presence of right artificial shoulder joint: Secondary | ICD-10-CM | POA: Diagnosis not present

## 2024-08-31 DIAGNOSIS — N179 Acute kidney failure, unspecified: Secondary | ICD-10-CM | POA: Diagnosis not present

## 2024-08-31 DIAGNOSIS — Z452 Encounter for adjustment and management of vascular access device: Secondary | ICD-10-CM | POA: Diagnosis not present

## 2024-08-31 DIAGNOSIS — R079 Chest pain, unspecified: Secondary | ICD-10-CM | POA: Diagnosis not present

## 2024-08-31 DIAGNOSIS — N186 End stage renal disease: Secondary | ICD-10-CM | POA: Diagnosis not present

## 2024-08-31 DIAGNOSIS — Z4682 Encounter for fitting and adjustment of non-vascular catheter: Secondary | ICD-10-CM | POA: Diagnosis not present

## 2024-08-31 DIAGNOSIS — R0789 Other chest pain: Secondary | ICD-10-CM | POA: Diagnosis not present

## 2024-08-31 DIAGNOSIS — I120 Hypertensive chronic kidney disease with stage 5 chronic kidney disease or end stage renal disease: Secondary | ICD-10-CM | POA: Diagnosis not present

## 2024-08-31 LAB — CBC WITH DIFFERENTIAL/PLATELET
Abs Immature Granulocytes: 0.02 K/uL (ref 0.00–0.07)
Basophils Absolute: 0 K/uL (ref 0.0–0.1)
Basophils Relative: 1 %
Eosinophils Absolute: 0.1 K/uL (ref 0.0–0.5)
Eosinophils Relative: 2 %
HCT: 22.3 % — ABNORMAL LOW (ref 39.0–52.0)
Hemoglobin: 7.1 g/dL — ABNORMAL LOW (ref 13.0–17.0)
Immature Granulocytes: 0 %
Lymphocytes Relative: 17 %
Lymphs Abs: 1.1 K/uL (ref 0.7–4.0)
MCH: 32.6 pg (ref 26.0–34.0)
MCHC: 31.8 g/dL (ref 30.0–36.0)
MCV: 102.3 fL — ABNORMAL HIGH (ref 80.0–100.0)
Monocytes Absolute: 1 K/uL (ref 0.1–1.0)
Monocytes Relative: 15 %
Neutro Abs: 4.2 K/uL (ref 1.7–7.7)
Neutrophils Relative %: 65 %
Platelets: 198 K/uL (ref 150–400)
RBC: 2.18 MIL/uL — ABNORMAL LOW (ref 4.22–5.81)
RDW: 15.4 % (ref 11.5–15.5)
WBC: 6.4 K/uL (ref 4.0–10.5)
nRBC: 0 % (ref 0.0–0.2)

## 2024-08-31 LAB — COMPREHENSIVE METABOLIC PANEL WITH GFR
ALT: 12 U/L (ref 0–44)
AST: 19 U/L (ref 15–41)
Albumin: 2.7 g/dL — ABNORMAL LOW (ref 3.5–5.0)
Alkaline Phosphatase: 51 U/L (ref 38–126)
Anion gap: 12 (ref 5–15)
BUN: 27 mg/dL — ABNORMAL HIGH (ref 8–23)
CO2: 26 mmol/L (ref 22–32)
Calcium: 7.3 mg/dL — ABNORMAL LOW (ref 8.9–10.3)
Chloride: 100 mmol/L (ref 98–111)
Creatinine, Ser: 3.71 mg/dL — ABNORMAL HIGH (ref 0.61–1.24)
GFR, Estimated: 15 mL/min — ABNORMAL LOW (ref >60)
Glucose, Bld: 87 mg/dL (ref 70–99)
Potassium: 3.2 mmol/L — ABNORMAL LOW (ref 3.5–5.1)
Sodium: 138 mmol/L (ref 135–145)
Total Bilirubin: <0.2 mg/dL (ref 0.0–1.2)
Total Protein: 5.1 g/dL — ABNORMAL LOW (ref 6.5–8.1)

## 2024-08-31 LAB — BRAIN NATRIURETIC PEPTIDE: B Natriuretic Peptide: 376.7 pg/mL — ABNORMAL HIGH (ref 0.0–100.0)

## 2024-08-31 LAB — TROPONIN I (HIGH SENSITIVITY)
Troponin I (High Sensitivity): 60 ng/L — ABNORMAL HIGH (ref <18)
Troponin I (High Sensitivity): 64 ng/L — ABNORMAL HIGH (ref <18)

## 2024-08-31 MED ORDER — PEG 3350-KCL-NA BICARB-NACL 420 G PO SOLR: 4000.0000 mL | Freq: Once | ORAL | 0 refills | Status: AC

## 2024-08-31 MED ORDER — LIDOCAINE HCL URETHRAL/MUCOSAL 2 % EX GEL
1.0000 | Freq: Once | CUTANEOUS | Status: AC
  Administered 2024-08-31: 1 via TOPICAL
  Filled 2024-08-31: qty 11

## 2024-08-31 MED ORDER — PEG 3350-KCL-NA BICARB-NACL 420 G PO SOLR: 4000.0000 mL | Freq: Once | ORAL | 0 refills | Status: DC

## 2024-08-31 MED ORDER — FLEET ENEMA RE ENEM
1.0000 | ENEMA | Freq: Once | RECTAL | Status: AC
  Administered 2024-08-31: 1 via RECTAL
  Filled 2024-08-31: qty 1

## 2024-08-31 NOTE — Discharge Instructions (Addendum)
 You are constipated.   Take golytely  as prescribed for constipation   Your heart enzymes are stable   See your doctor for follow up. Go to dialysis as scheduled   Return to ER if you have worse chest pain or abdominal pain or vomiting

## 2024-08-31 NOTE — ED Notes (Signed)
 Dr. Patt at bedside attempting manual disimpaction.

## 2024-08-31 NOTE — ED Notes (Signed)
 Patient now says his chest pain has resolved.

## 2024-08-31 NOTE — ED Notes (Signed)
 Call ptar they will be here within an hour

## 2024-08-31 NOTE — ED Provider Notes (Signed)
 [place] EMERGENCY DEPARTMENT AT [facility] Provider Note   CSN: [id number] Arrival date & time: 08/31/24  8048     Patient presents with: Chest Pain   Connor Wilkerson is a 83 y.o. male history of CKD on dialysis, A-fib on Eliquis , here presenting with chest pain and constipation.  Patient states that he is constipated for several days.  Patient states that he was trying to strain and then had some chest pain.  He states that the chest pain has resolved.  Patient denies any nausea vomiting   The history is provided by the patient.       Prior to Admission medications   Medication Sig Start Date End Date Taking? Authorizing Provider  polyethylene glycol-electrolytes (GAVILYTE-N  WITH FLAVOR PACK) 420 g solution Take 4,000 mLs by mouth once for 1 dose. 08/31/24 08/31/24 Yes Patt Alm Macho, MD  acetaminophen  (TYLENOL ) 500 MG tablet Take 500-1,000 mg by mouth every 6 (six) hours as needed for moderate pain (pain score 4-6).    [provider]  amLODipine  (NORVASC ) 2.5 MG tablet TAKE 2 TABLETS BY MOUTH DAILY Patient taking differently: Take 2.5 mg by mouth daily. 03/23/24   Fernande Elspeth BROCKS, MD  apixaban  (ELIQUIS ) 2.5 MG TABS tablet Take 1 tablet (2.5 mg total) by mouth 2 (two) times daily. 06/16/24   Court Dorn JINNY, MD  atorvastatin (LIPITOR) 80 MG tablet Take 1 tablet (80 mg total) by mouth daily. 08/17/24 08/17/25  Trudy Mliss Dragon, MD  buPROPion  (WELLBUTRIN  SR) 150 MG 12 hr tablet Take 1 tablet (150 mg total) by mouth daily. 08/18/24   Azadegan, Maryam, MD  diclofenac Sodium (VOLTAREN) 1 % GEL Apply 4 g topically 4 (four) times daily. 08/17/24   Azadegan, Maryam, MD  escitalopram  (LEXAPRO ) 10 MG tablet Take 10 mg by mouth daily.    [provider]  famotidine  (PEPCID ) 20 MG tablet Take 20 mg by mouth 2 (two) times daily.    [provider]  feeding supplement (ENSURE PLUS HIGH PROTEIN) LIQD Take 237 mLs by mouth 2 (two) times daily  between meals. 08/17/24   Azadegan, Maryam, MD  gabapentin  (NEURONTIN ) 300 MG capsule Take 300 mg by mouth at bedtime.    [provider]  lidocaine  (LIDODERM ) 5 % Place 1 patch onto the skin daily as needed (Pain). 07/13/24   [provider]  mupirocin  ointment (BACTROBAN ) 2 % Apply 1 Application topically 2 (two) times daily. Head/Wrist 08/01/24   [provider]  nitroGLYCERIN  (NITROSTAT ) 0.4 MG SL tablet Place 1 tablet (0.4 mg total) under the tongue every 5 (five) minutes as needed for chest pain. 12/11/22   Fernande Elspeth BROCKS, MD  sacubitril -valsartan  (ENTRESTO ) 24-26 MG TAKE 1/2 TABLET TWICE A DAY BY MOUTH 05/03/24   Court Dorn JINNY, MD  sevelamer carbonate (RENVELA) 800 MG tablet Take 1 tablet (800 mg total) by mouth 3 (three) times daily with meals. 08/17/24   Azadegan, Maryam, MD  traZODone  (DESYREL ) 50 MG tablet Take 50 mg by mouth at bedtime as needed for sleep. 12/21/19   [provider]    Allergies: Tramadol    Review of Systems  Cardiovascular:  Positive for chest pain.  Gastrointestinal:  Positive for constipation.  All other systems reviewed and are negative.   Updated Vital Signs BP 101/60   Pulse 70   Temp 98.8 F (37.1 C) (Oral)   Resp 16   Ht 5' 10 (1.778 m)   Wt 68.6 kg   SpO2  100%   BMI 21.70 kg/m   Physical Exam Vitals and nursing note reviewed.  Constitutional:      Comments: Slightly uncomfortable  HENT:     Head: Normocephalic.  Eyes:     Extraocular Movements: Extraocular movements intact.     Pupils: Pupils are equal, round, and reactive to light.  Cardiovascular:     Rate and Rhythm: Normal rate and regular rhythm.     Heart sounds: Normal heart sounds.  Pulmonary:     Effort: Pulmonary effort is normal.     Breath sounds: Normal breath sounds.  Abdominal:     General: Bowel sounds are normal.     Palpations: Abdomen is soft.  Genitourinary:    Comments: Rectal- stool impaction  Musculoskeletal:         General: Normal range of motion.     Cervical back: Normal range of motion.  Skin:    General: Skin is warm.     Capillary Refill: Capillary refill takes less than 2 seconds.  Neurological:     General: No focal deficit present.     Mental Status: He is alert and oriented to person, place, and time.  Psychiatric:        Mood and Affect: Mood normal.        Behavior: Behavior normal.     (all labs ordered are listed, but only abnormal results are displayed) Labs Reviewed  CBC WITH DIFFERENTIAL/PLATELET - Abnormal; Notable for the following components:      Result Value   RBC 2.18 (*)    Hemoglobin 7.1 (*)    HCT 22.3 (*)    MCV 102.3 (*)    All other components within normal limits  COMPREHENSIVE METABOLIC PANEL WITH GFR - Abnormal; Notable for the following components:   Potassium 3.2 (*)    BUN 27 (*)    Creatinine, Ser 3.71 (*)    Calcium  7.3 (*)    Total Protein 5.1 (*)    Albumin  2.7 (*)    GFR, Estimated 15 (*)    All other components within normal limits  BRAIN NATRIURETIC PEPTIDE - Abnormal; Notable for the following components:   B Natriuretic Peptide 376.7 (*)    All other components within normal limits  TROPONIN I (HIGH SENSITIVITY) - Abnormal; Notable for the following components:   Troponin I (High Sensitivity) 60 (*)    All other components within normal limits  TROPONIN I (HIGH SENSITIVITY) - Abnormal; Notable for the following components:   Troponin I (High Sensitivity) 64 (*)    All other components within normal limits    EKG: EKG Interpretation Date/Time:  Wednesday August 31 2024 19:57:19 EDT Ventricular Rate:  72 PR Interval:  168 QRS Duration:  100 QT Interval:  422 QTC Calculation: 462 R Axis:   43  Text Interpretation: Sinus rhythm Nonspecific repol abnormality, lateral leads Baseline wander in lead(s) V4 No significant change since last tracing Confirmed by Patt Alm DEL [phone]) on 08/31/2024 9:07:17 PM  Radiology: [facility] Abd Portable 1  View Result Date: 08/31/2024 EXAM: 1 VIEW XRAY OF THE ABDOMEN 08/31/2024 08:31:00 PM COMPARISON: CT abdomen and pelvis 08/11/1999 and 08/27/2024. CLINICAL HISTORY: constipation. constipation constipation. constipation FINDINGS: LINES, TUBES AND DEVICES: Neurosurgical clips in the central upper abdomen. Postsurgical changes and catheters in the chest. BOWEL: There is moderate stool burden. Nonobstructive bowel gas pattern. SOFT TISSUES: No opaque urinary calculi. BONES: There are severe degenerative changes throughout the lumbar spine. No acute osseous abnormality. IMPRESSION: 1. Moderate  stool burden. Electronically signed by: Greig Pique MD 08/31/2024 08:36 PM EDT RP Workstation: [id number]   DG Chest Port 1 View Result Date: 08/31/2024 EXAM: 1 VIEW(S) XRAY OF THE CHEST 08/31/2024 08:27:00 PM COMPARISON: Chest x-ray 08/10/2024. CLINICAL HISTORY: 355200 Chest pain [id number]. Chest pain Chest pain. FINDINGS: LINES, TUBES AND DEVICES: Right-sided central venous catheter tip ends in the mid SVC. LUNGS AND PLEURA: No focal pulmonary opacity. No pulmonary edema. No pleural effusion. No pneumothorax. HEART AND MEDIASTINUM: Status post cardiac surgery. Left-sided pacemaker in place. No acute abnormality of the cardiac and mediastinal silhouettes. BONES AND SOFT TISSUES: Right shoulder arthroplasty is present. No acute osseous abnormality. IMPRESSION: 1. No acute process. Electronically signed by: Greig Pique MD 08/31/2024 08:32 PM EDT RP Workstation: [id number]     Procedures   Medications Ordered in the ED  lidocaine  (XYLOCAINE ) 2 % jelly 1 Application (1 Application Topical Given 08/31/24 2058)  sodium phosphate  (FLEET) enema 1 enema (1 enema Rectal Given 08/31/24 2058)                                    Medical Decision Making ALBARAA SWINGLE is a 83 y.o. male here presenting with chest pain and constipation.  Patient has stool impaction on exam.  I was able to disimpact patient but patient still  has a lot of stool.  I tried to give enema but he was unable to retain the enema.  X-ray did not show any obstruction.  I offered to give him smog enema but he states that he would rather try oral meds.  In terms of his chest pain, patient recently admitted for chest pain and had negative workup.  Low suspicion for ACS and I think chest pain likely from straining from having a bowel movement.  Plan to get troponin x 2 and labs  10:59 PM I reviewed patient's labs and troponin was 60 then 64.  Hemoglobin is stable at 7.1.  Patient will be prescribed GoLytely  to help with constipation and told him to follow-up with his primary care doctor and nephrologist.  Patient does not need any emergent dialysis today   Problems Addressed: Chest pain, unspecified type: acute illness or injury Constipation, unspecified constipation type: chronic illness or injury ESRD (end stage renal disease) on dialysis [facility]): chronic illness or injury  Amount and/or Complexity of Data Reviewed Labs: ordered. Decision-making details documented in ED Course. Radiology: ordered and independent interpretation performed. Decision-making details documented in ED Course. ECG/medicine tests: ordered and independent interpretation performed. Decision-making details documented in ED Course.  Risk OTC drugs. Prescription drug management.     Final diagnoses:  Chest pain, unspecified type  Constipation, unspecified constipation type  ESRD (end stage renal disease) on dialysis [facility])    ED Discharge Orders          Ordered    polyethylene glycol-electrolytes (GAVILYTE-N  WITH FLAVOR PACK) 420 g solution   Once        08/31/24 2213               Jashay Roddy Hsienta, MD 08/31/24 2300

## 2024-08-31 NOTE — ED Triage Notes (Signed)
 Pt BIB [facility] coming from [facility]. Pt comes with c/o L sided chest pain 8/10 and radiating started at 1900. Also with c/o SHOB and constipation. No N/V/D reported by pt.  EMS: 3 doses of Nitroglycerin  with no relief 324mg  aspirin  no relief   84HR 96% 2L 116/78

## 2024-08-31 NOTE — ED Notes (Signed)
..  Patient is A&Ox4 upon discharge. Patient verbalized understanding of prescription, discharge instructions and follow-up care. Patient transported to facility with [facility]. Report given to facility RN prior to discharge.

## 2024-09-01 DIAGNOSIS — K5900 Constipation, unspecified: Secondary | ICD-10-CM | POA: Diagnosis not present

## 2024-09-02 DIAGNOSIS — N179 Acute kidney failure, unspecified: Secondary | ICD-10-CM | POA: Diagnosis not present

## 2024-09-02 DIAGNOSIS — Z992 Dependence on renal dialysis: Secondary | ICD-10-CM | POA: Diagnosis not present

## 2024-09-05 DIAGNOSIS — Z992 Dependence on renal dialysis: Secondary | ICD-10-CM | POA: Diagnosis not present

## 2024-09-05 DIAGNOSIS — N179 Acute kidney failure, unspecified: Secondary | ICD-10-CM | POA: Diagnosis not present

## 2024-09-07 DIAGNOSIS — F32A Depression, unspecified: Secondary | ICD-10-CM | POA: Diagnosis not present

## 2024-09-07 DIAGNOSIS — E785 Hyperlipidemia, unspecified: Secondary | ICD-10-CM | POA: Diagnosis not present

## 2024-09-07 DIAGNOSIS — N179 Acute kidney failure, unspecified: Secondary | ICD-10-CM | POA: Diagnosis not present

## 2024-09-07 DIAGNOSIS — I739 Peripheral vascular disease, unspecified: Secondary | ICD-10-CM | POA: Diagnosis not present

## 2024-09-07 DIAGNOSIS — F411 Generalized anxiety disorder: Secondary | ICD-10-CM | POA: Diagnosis not present

## 2024-09-07 DIAGNOSIS — Z992 Dependence on renal dialysis: Secondary | ICD-10-CM | POA: Diagnosis not present

## 2024-09-09 DIAGNOSIS — Z992 Dependence on renal dialysis: Secondary | ICD-10-CM | POA: Diagnosis not present

## 2024-09-09 DIAGNOSIS — N179 Acute kidney failure, unspecified: Secondary | ICD-10-CM | POA: Diagnosis not present

## 2024-09-10 ENCOUNTER — Telehealth: Payer: Self-pay | Admitting: Physician Assistant

## 2024-09-10 NOTE — Telephone Encounter (Signed)
 Attempted to call home again without success.

## 2024-09-10 NOTE — Telephone Encounter (Signed)
 Attempt was made to reach patient again without success.

## 2024-09-10 NOTE — Telephone Encounter (Signed)
 Patient's wife contacted cardiology after our answering service again.  I attempted to call the patient's home phone without success and again cell phone without success either.  Home phone continue to ring without anybody picking it up.  Cell phone eventually went into voicemail however voicebox is completely full and I cannot leave a message.

## 2024-09-10 NOTE — Telephone Encounter (Signed)
 Paged by Holley, the patient's wife regarding whether the patient should continue on amlodipine .  I have reviewed his record.  He has a history of CAD status post CABG, ischemic cardiomyopathy and end-stage renal disease on dialysis.  He had a recent syncopal event after dialysis session.  He was most recently seen a week ago in the ED for chest pain, chest pain was felt unlikely to be ACS.  He is currently on 2.5 of amlodipine .  I have attempted to call the patient's home phone twice, the phone keep going without ability to leave a message.  I have tried to call his cell phone as well, he did not pick up either.  Voicemail box is full and cannot leave a message.  I will attempt to call the patient again in 30 minutes.

## 2024-09-12 DIAGNOSIS — Z992 Dependence on renal dialysis: Secondary | ICD-10-CM | POA: Diagnosis not present

## 2024-09-12 DIAGNOSIS — N179 Acute kidney failure, unspecified: Secondary | ICD-10-CM | POA: Diagnosis not present

## 2024-09-14 DIAGNOSIS — N179 Acute kidney failure, unspecified: Secondary | ICD-10-CM | POA: Diagnosis not present

## 2024-09-14 DIAGNOSIS — Z992 Dependence on renal dialysis: Secondary | ICD-10-CM | POA: Diagnosis not present

## 2024-09-16 DIAGNOSIS — Z992 Dependence on renal dialysis: Secondary | ICD-10-CM | POA: Diagnosis not present

## 2024-09-16 DIAGNOSIS — N179 Acute kidney failure, unspecified: Secondary | ICD-10-CM | POA: Diagnosis not present

## 2024-09-19 ENCOUNTER — Telehealth: Payer: Self-pay | Admitting: Cardiovascular Disease

## 2024-09-19 DIAGNOSIS — Z992 Dependence on renal dialysis: Secondary | ICD-10-CM | POA: Diagnosis not present

## 2024-09-19 DIAGNOSIS — I480 Paroxysmal atrial fibrillation: Secondary | ICD-10-CM

## 2024-09-19 DIAGNOSIS — N179 Acute kidney failure, unspecified: Secondary | ICD-10-CM | POA: Diagnosis not present

## 2024-09-19 NOTE — Telephone Encounter (Signed)
 Patient's wife called to ask which medications patient should be taking. She states patient was recently discharged from rehab with [facility] on 09/09/24 and there have been some medication changes.  Requested information from [facility] to be faxed to our office at [phone].  Patient needs refills on Eliquis  and Entresto . Entresto  showing as paused since 08/17/24. Wife reports patient has been taking Entresto  for the past few weeks.  Appt with Dr. Court scheduled for 09/20/24 at 1:30 PM

## 2024-09-19 NOTE — Telephone Encounter (Signed)
 Pt c/o medication issue:  1. Name of Medication:   2. How are you currently taking this medication (dosage and times per day)?   3. Are you having a reaction (difficulty breathing--STAT)?   4. What is your medication issue? Wife is confused about what medications he should be on. But also wants to know what meds that he is taking that affect his Kidneys. Please advise

## 2024-09-20 ENCOUNTER — Other Ambulatory Visit: Payer: Self-pay | Admitting: Cardiovascular Disease

## 2024-09-20 ENCOUNTER — Ambulatory Visit: Attending: Cardiovascular Disease | Admitting: Cardiovascular Disease

## 2024-09-20 ENCOUNTER — Encounter: Payer: Self-pay | Admitting: Cardiovascular Disease

## 2024-09-20 VITALS — BP 130/64 | HR 86 | Ht 70.0 in | Wt 147.6 lb

## 2024-09-20 DIAGNOSIS — I2510 Atherosclerotic heart disease of native coronary artery without angina pectoris: Secondary | ICD-10-CM | POA: Diagnosis not present

## 2024-09-20 DIAGNOSIS — I5022 Chronic systolic (congestive) heart failure: Secondary | ICD-10-CM | POA: Diagnosis not present

## 2024-09-20 DIAGNOSIS — I10 Essential (primary) hypertension: Secondary | ICD-10-CM

## 2024-09-20 DIAGNOSIS — Z95810 Presence of automatic (implantable) cardiac defibrillator: Secondary | ICD-10-CM

## 2024-09-20 DIAGNOSIS — I480 Paroxysmal atrial fibrillation: Secondary | ICD-10-CM

## 2024-09-20 DIAGNOSIS — E782 Mixed hyperlipidemia: Secondary | ICD-10-CM | POA: Diagnosis not present

## 2024-09-20 MED ORDER — APIXABAN 2.5 MG PO TABS: 2.5000 mg | ORAL_TABLET | Freq: Two times a day (BID) | ORAL | 1 refills | Status: AC

## 2024-09-20 MED ORDER — SACUBITRIL-VALSARTAN 24-26 MG PO TABS: ORAL_TABLET | ORAL | 2 refills | Status: AC

## 2024-09-20 NOTE — Telephone Encounter (Signed)
*  STAT* If patient is at the pharmacy, call can be transferred to refill team.   1. Which medications need to be refilled? (please list name of each medication and dose if known) atorvastatin (LIPITOR) 80 MG tablet  mupirocin  ointment (BACTROBAN ) 2 %  sevelamer carbonate (RENVELA) 800 MG tablet   2. Would you like to learn more about the convenience, safety, & potential cost savings by using the [facility]?     3. Are you open to using the [facility] (Type [facility].  ).   4. Which pharmacy/location (including street and city if local pharmacy) is medication to be sent to? [id number] - [place], [place] - [address]    5. Do they need a 30 day or 90 day supply? 90 day

## 2024-09-20 NOTE — Assessment & Plan Note (Signed)
 History of essential hypertension blood pressure measured today 130/64.  He is on low-dose amlodipine  and Entresto .

## 2024-09-20 NOTE — Assessment & Plan Note (Signed)
 History of PAF maintaining sinus rhythm on Eliquis oral anticoagulation.

## 2024-09-20 NOTE — Patient Instructions (Signed)

## 2024-09-20 NOTE — Telephone Encounter (Signed)
 Court Dorn PARAS, MD to Vicci Roxie CROME, RN     09/19/24  7:36 PM OK to refill  Prescription refills sent in for pt.

## 2024-09-20 NOTE — Progress Notes (Signed)
 09/20/2024 Connor Wilkerson   07-27-1941  [id number]  Primary Physician Connor Vicenta BRAVO, MD Primary Cardiologist: Connor Wilkerson Connor Lesches MD GENI Connor Wilkerson, [place]  HPI:  Connor Wilkerson is a 83 y.o.   thin-appearing married Caucasian male father of 1, grandfather 2 grandchildren who I last saw in the office 08/11/2023.Connor Wilkerson He was referred for preoperative clearance before right shoulder replacement by Dr. Mariea. He is followed by Dr. Fernande for his ICD.    He has a history of ischemic cardiomyopathy in the past. He had CABG in 1999. Other problems include treated hypertension and hyperlipidemia. He smoked remotely having quit 15 to 16 years ago and quit drinking at the same time. He had a cardiac catheterization performed by Dr. Levern 12/11/2015 revealing patent grafts and again by Dr. Verlin 01/07/2018 again revealing patent grafts. Recent 2D echo performed 04/13/2022 revealed normal LV systolic function.    Since I saw him a year ago he apparently has gone on hemodialysis 2 months ago.  He had uncomplicated right shoulder replacement 1 year ago.  He denies chest pain or shortness of breath.   Current Meds  Medication Sig   acetaminophen  (TYLENOL ) 500 MG tablet Take 500-1,000 mg by mouth every 6 (six) hours as needed for moderate pain (pain score 4-6).   amLODipine  (NORVASC ) 2.5 MG tablet TAKE 2 TABLETS BY MOUTH DAILY (Patient taking differently: Take 2.5 mg by mouth daily.)   apixaban  (ELIQUIS ) 2.5 MG TABS tablet Take 1 tablet (2.5 mg total) by mouth 2 (two) times daily.   atorvastatin (LIPITOR) 80 MG tablet Take 1 tablet (80 mg total) by mouth daily.   buPROPion  (WELLBUTRIN  SR) 150 MG 12 hr tablet Take 1 tablet (150 mg total) by mouth daily.   diclofenac Sodium (VOLTAREN) 1 % GEL Apply 4 g topically 4 (four) times daily.   escitalopram  (LEXAPRO ) 10 MG tablet Take 10 mg by mouth daily.   famotidine  (PEPCID ) 20 MG tablet Take 20 mg by mouth 2 (two) times daily.   gabapentin  (NEURONTIN ) 300 MG  capsule Take 300 mg by mouth at bedtime.   lidocaine  (LIDODERM ) 5 % Place 1 patch onto the skin daily as needed (Pain).   nitroGLYCERIN  (NITROSTAT ) 0.4 MG SL tablet Place 1 tablet (0.4 mg total) under the tongue every 5 (five) minutes as needed for chest pain.   sevelamer carbonate (RENVELA) 800 MG tablet Take 1 tablet (800 mg total) by mouth 3 (three) times daily with meals.   traZODone  (DESYREL ) 50 MG tablet Take 50 mg by mouth at bedtime as needed for sleep.     Allergies  Allergen Reactions   Tramadol Other (See Comments)    Unknown Reaction -  Unsure of reaction     Social History   Socioeconomic History   Marital status: Married    Spouse name: Not on file   Number of children: 1   Years of education: HS +   Highest education level: Not on file  Occupational History   Occupation: retired  Tobacco Use   Smoking status: Former    Types: Pipe    Quit date: 09/19/1999    Years since quitting: 25.0    Passive exposure: Never   Smokeless tobacco: Never   Tobacco comments:    quit in 1998  Vaping Use   Vaping status: Never Used  Substance and Sexual Activity   Alcohol  use: No    Alcohol /week: 0.0 standard drinks of alcohol     Comment: recovering alcoholic,  quit 52yrs ago (2000)   Drug use: No   Sexual activity: Not on file  Other Topics Concern   Not on file  Social History Narrative   Lives at home w/ his wife.   Patient is right handed.   Patient drinks about 3 cups of soda daily.   Social Drivers of Corporate Investment Banker Strain: Not on file  Food Insecurity: No Food Insecurity (08/10/2024)   Hunger Vital Sign    Worried About Running Out of Food in the Last Year: Never true    Ran Out of Food in the Last Year: Never true  Transportation Needs: No Transportation Needs (08/10/2024)   PRAPARE - Administrator, Civil Service (Medical): No    Lack of Transportation (Non-Medical): No  Physical Activity: Not on file  Stress: Not on file  Social  Connections: Patient Declined (08/10/2024)   Social Connection and Isolation Panel    Frequency of Communication with Friends and Family: Patient declined    Frequency of Social Gatherings with Friends and Family: Patient declined    Attends Religious Services: Patient declined    Database Administrator or Organizations: Patient declined    Attends Banker Meetings: Patient declined    Marital Status: Patient declined  Intimate Partner Violence: Not At Risk (08/10/2024)   Humiliation, Afraid, Rape, and Kick questionnaire    Fear of Current or Ex-Partner: No    Emotionally Abused: No    Physically Abused: No    Sexually Abused: No     Review of Systems: General: negative for chills, fever, night sweats or weight changes.  Cardiovascular: negative for chest pain, dyspnea on exertion, edema, orthopnea, palpitations, paroxysmal nocturnal dyspnea or shortness of breath Dermatological: negative for rash Respiratory: negative for cough or wheezing Urologic: negative for hematuria Abdominal: negative for nausea, vomiting, diarrhea, bright red blood per rectum, melena, or hematemesis Neurologic: negative for visual changes, syncope, or dizziness All other systems reviewed and are otherwise negative except as noted above.    Blood pressure 130/64, pulse 86, height 5' 10 (1.778 m), weight 147 lb 9.6 oz (67 kg), SpO2 96%.  General appearance: alert and no distress Neck: no adenopathy, no JVD, supple, symmetrical, trachea midline, thyroid  not enlarged, symmetric, no tenderness/mass/nodules, and soft bilateral carotid bruits Lungs: clear to auscultation bilaterally Heart: regular rate and rhythm, S1, S2 normal, no murmur, click, rub or gallop Extremities: extremities normal, atraumatic, no cyanosis or edema Pulses: 2+ and symmetric Skin: Skin color, texture, turgor normal. No rashes or lesions Neurologic: Grossly normal  EKG not performed today      ASSESSMENT AND PLAN:    Chronic systolic CHF (congestive heart failure) (HCC) History of LV dysfunction in the past with a EF performed 04/13/2022 which was normal.  He is on Entresto .  Implantable cardioverter-defibrillator (ICD) in situ History of ICD implant Tatian remotely by Dr. Fernande followed by EP.  Essential hypertension History of essential hypertension blood pressure measured today 130/64.  He is on low-dose amlodipine  and Entresto .  CAD (coronary artery disease) History of CAD status post coronary artery bypass grafting in 1999.  His most recent cardiac catheterization performed by Dr. Verlin 01/07/2018 revealed patent grafts.  He denies chest pain.  Hyperlipidemia History of hyperlipidemia on statin therapy with lipid profile performed 01/08/2024 revealing a total cholesterol 106, LDL 46 and HDL 46.  Atrial fibrillation (HCC) History of PAF maintaining sinus rhythm on Eliquis  oral anticoagulation.     Connor Wilkerson PARAS.  Court MD [facility] 09/20/2024 1:53 PM

## 2024-09-20 NOTE — Assessment & Plan Note (Signed)
 History of ICD implant Tatian remotely by Dr. Fernande followed by EP.

## 2024-09-20 NOTE — Assessment & Plan Note (Signed)
 History of CAD status post coronary artery bypass grafting in 1999.  His most recent cardiac catheterization performed by Dr. Verlin 01/07/2018 revealed patent grafts.  He denies chest pain.

## 2024-09-20 NOTE — Assessment & Plan Note (Addendum)
 History of LV dysfunction in the past with a EF performed 04/13/2022 which was normal.  He is on Entresto .

## 2024-09-20 NOTE — Assessment & Plan Note (Signed)
 History of hyperlipidemia on statin therapy with lipid profile performed 01/08/2024 revealing a total cholesterol 106, LDL 46 and HDL 46.

## 2024-09-21 DIAGNOSIS — N179 Acute kidney failure, unspecified: Secondary | ICD-10-CM | POA: Diagnosis not present

## 2024-09-21 DIAGNOSIS — Z992 Dependence on renal dialysis: Secondary | ICD-10-CM | POA: Diagnosis not present

## 2024-09-21 MED ORDER — ATORVASTATIN CALCIUM 80 MG PO TABS: 80.0000 mg | ORAL_TABLET | Freq: Every day | ORAL | 3 refills | Status: AC

## 2024-09-21 NOTE — Telephone Encounter (Signed)
 Spoke with pt's wife, Sharlet (ok per [facility]) regarding medication refills. Explained that mupirocin  ointment and Renvela  are not cardiac medications and that we would not be able to refill those. Advised that atorvastatin  refill has been sent to pt's preferred pharmacy. Wife verbalizes understanding.

## 2024-09-23 DIAGNOSIS — Z992 Dependence on renal dialysis: Secondary | ICD-10-CM | POA: Diagnosis not present

## 2024-09-23 DIAGNOSIS — N179 Acute kidney failure, unspecified: Secondary | ICD-10-CM | POA: Diagnosis not present

## 2024-09-26 DIAGNOSIS — Z992 Dependence on renal dialysis: Secondary | ICD-10-CM | POA: Diagnosis not present

## 2024-09-26 DIAGNOSIS — N179 Acute kidney failure, unspecified: Secondary | ICD-10-CM | POA: Diagnosis not present

## 2024-09-26 DIAGNOSIS — N2581 Secondary hyperparathyroidism of renal origin: Secondary | ICD-10-CM | POA: Diagnosis not present

## 2024-09-28 ENCOUNTER — Telehealth: Payer: Self-pay | Admitting: Cardiovascular Disease

## 2024-09-28 DIAGNOSIS — N2581 Secondary hyperparathyroidism of renal origin: Secondary | ICD-10-CM | POA: Diagnosis not present

## 2024-09-28 DIAGNOSIS — N179 Acute kidney failure, unspecified: Secondary | ICD-10-CM | POA: Diagnosis not present

## 2024-09-28 DIAGNOSIS — Z992 Dependence on renal dialysis: Secondary | ICD-10-CM | POA: Diagnosis not present

## 2024-09-28 NOTE — Telephone Encounter (Signed)
 Pts wife is requesting a callback to review medications. She wants to make sure he is taking the right dosages after he was in the hospital for his kidneys. Please advise.

## 2024-09-28 NOTE — Telephone Encounter (Signed)
 Called  no answer - phone rang multiple times before answering machine began .  Left message for wife to call back and ask for any triage nurse can manage.   Last visit with Dr Court did not change any medication  Dr Court indicated in  office note -patient was taking Entresto 

## 2024-09-28 NOTE — Telephone Encounter (Signed)
 Spoke to patient's wife - she wanted to review what medication should be taking   RN reviewed  last office visit 09/20/24.   Per Dr Melodee 's office note -  cardiac medication patient is taking   Amlodipine  2.5 mg taking 2 tablets daily  Apixaban  2.5 mg twice a day  Sacubitiral- valsartan  24/26 mg  taking 2 tablets twice a day    Question about  Bupropion  150 mg - RN informed wife that she would need to contact the provider who ordered this medication. Mrs Schuneman voiced understanding

## 2024-09-30 DIAGNOSIS — M109 Gout, unspecified: Secondary | ICD-10-CM | POA: Diagnosis not present

## 2024-09-30 DIAGNOSIS — F419 Anxiety disorder, unspecified: Secondary | ICD-10-CM | POA: Diagnosis not present

## 2024-09-30 LAB — CUP PACEART REMOTE DEVICE CHECK
Battery Remaining Longevity: 12 mo
Battery Remaining Percentage: 15 %
Brady Statistic RA Percent Paced: 64 %
Brady Statistic RV Percent Paced: 0 %
Date Time Interrogation Session: 20251128004200
HighPow Impedance: 39 Ohm
Implantable Lead Connection Status: 753985
Implantable Lead Connection Status: 753985
Implantable Lead Implant Date: 20060512
Implantable Lead Implant Date: 20060512
Implantable Lead Location: 753859
Implantable Lead Location: 753860
Implantable Lead Model: 158
Implantable Lead Model: 5076
Implantable Lead Serial Number: 159477
Implantable Pulse Generator Implant Date: 20150123
Lead Channel Impedance Value: 359 Ohm
Lead Channel Impedance Value: 470 Ohm
Lead Channel Pacing Threshold Amplitude: 1 V
Lead Channel Pacing Threshold Amplitude: 1.2 V
Lead Channel Pacing Threshold Pulse Width: 0.4 ms
Lead Channel Pacing Threshold Pulse Width: 0.4 ms
Lead Channel Setting Pacing Amplitude: 2 V
Lead Channel Setting Pacing Amplitude: 2.4 V
Lead Channel Setting Pacing Pulse Width: 0.4 ms
Lead Channel Setting Sensing Sensitivity: 0.6 mV
Pulse Gen Serial Number: 112776

## 2024-10-01 DIAGNOSIS — N2581 Secondary hyperparathyroidism of renal origin: Secondary | ICD-10-CM | POA: Diagnosis not present

## 2024-10-01 DIAGNOSIS — Z992 Dependence on renal dialysis: Secondary | ICD-10-CM | POA: Diagnosis not present

## 2024-10-01 DIAGNOSIS — N179 Acute kidney failure, unspecified: Secondary | ICD-10-CM | POA: Diagnosis not present

## 2024-10-03 ENCOUNTER — Ambulatory Visit (INDEPENDENT_AMBULATORY_CARE_PROVIDER_SITE_OTHER)

## 2024-10-03 ENCOUNTER — Ambulatory Visit: Payer: Self-pay | Admitting: Cardiology

## 2024-10-03 DIAGNOSIS — I5022 Chronic systolic (congestive) heart failure: Secondary | ICD-10-CM

## 2024-10-04 ENCOUNTER — Other Ambulatory Visit (HOSPITAL_COMMUNITY): Payer: Self-pay

## 2024-10-07 NOTE — Progress Notes (Signed)
 Remote ICD Transmission

## 2024-10-11 ENCOUNTER — Other Ambulatory Visit (HOSPITAL_COMMUNITY): Payer: Self-pay

## 2024-10-11 ENCOUNTER — Telehealth: Payer: Self-pay

## 2024-10-11 NOTE — Telephone Encounter (Signed)
 [facility] Patient Advocate Encounter  Insurance verification completed.    The patient is insured through [place].     Ran test claim for EPCLUSA   Medication will need a PA.   Ran test claim for MAVYRET  Medication will need a PA.    We will continue to follow to see if copay assistance is needed.  This test claim was processed through [facility]- copay amounts may vary at other pharmacies due to pharmacy/plan contracts, or as the patient moves through the different stages of their insurance plan.

## 2024-10-12 DIAGNOSIS — M109 Gout, unspecified: Secondary | ICD-10-CM | POA: Diagnosis not present

## 2024-10-12 DIAGNOSIS — R0989 Other specified symptoms and signs involving the circulatory and respiratory systems: Secondary | ICD-10-CM | POA: Diagnosis not present

## 2024-10-12 DIAGNOSIS — M7752 Other enthesopathy of left foot: Secondary | ICD-10-CM | POA: Diagnosis not present

## 2024-10-12 DIAGNOSIS — M79672 Pain in left foot: Secondary | ICD-10-CM | POA: Diagnosis not present

## 2024-10-19 ENCOUNTER — Encounter: Admitting: Infectious Diseases

## 2024-10-21 ENCOUNTER — Telehealth: Payer: Self-pay | Admitting: Cardiovascular Disease

## 2024-10-21 NOTE — Telephone Encounter (Signed)
Returning call. Mailbox full and unable to leave message.

## 2024-10-21 NOTE — Telephone Encounter (Deleted)
 SABRA

## 2024-10-21 NOTE — Telephone Encounter (Signed)
 Per chart review, Hospital stay 10/8 on discharge summary:  STOP taking these medications     rosuvastatin  40 MG tablet Commonly known as: CRESTOR    # Rhabdomyolysis Concern CK mildly elevated (1210 ? peak 1719), likely due to dehydration and immobility. Crestor  discontinued; CK down-trended to 300.

## 2024-10-21 NOTE — Telephone Encounter (Signed)
 Pt c/o medication issue:  1. Name of Medication:  atorvastatin  (LIPITOR) 80 MG tablet  2. How are you currently taking this medication (dosage and times per day)? As written   3. Are you having a reaction (difficulty breathing--STAT)? No   4. What is your medication issue? Pt spouse called in for a refill of Rosuvastatin  instead Atorvastatin  is listed on med list please advise

## 2024-10-28 NOTE — Telephone Encounter (Signed)
 Call from patient's wife Sharlet transferred directly to triage nurse.  Sharlet states she is trying to refill prescription for patient's rosuvastatin . Shared with Sharlet this medication was discontinued in October at discharge from hospital due to abnormal labs and concern for rhabdomyolysis.  Patient was switched to atorvastatin  80 mg daily. Sharlet states patient has been taking both the atorvastatin  and the rosuvastatin . Instructed Sharlet patient should never be on 2 statin medications at the same time.  Patient has enough atorvastatin  with refills available.   Sharlet reports patient has been experiencing weakness in his arms, legs and hands. She asked if this would get better now that he's not taking both the atorvastatin  and rosuvastatin . Informed her it may help to improve this, but cannot be certain this is what has been causing this symptom. Sharlet verbalized understanding.  Patient has appt with Dr. Court on 11/14/2024.  Will forward to Dr. Court to review.

## 2024-11-09 ENCOUNTER — Other Ambulatory Visit: Payer: Self-pay | Admitting: Surgery

## 2024-11-09 DIAGNOSIS — N186 End stage renal disease: Secondary | ICD-10-CM

## 2024-11-14 ENCOUNTER — Telehealth: Payer: Self-pay

## 2024-11-14 ENCOUNTER — Ambulatory Visit: Admitting: Cardiovascular Disease

## 2024-11-14 ENCOUNTER — Ambulatory Visit (HOSPITAL_COMMUNITY)

## 2024-11-14 ENCOUNTER — Ambulatory Visit: Admitting: Surgery

## 2024-11-14 ENCOUNTER — Ambulatory Visit (HOSPITAL_COMMUNITY): Admission: RE | Admit: 2024-11-14 | Source: Ambulatory Visit

## 2024-11-21 ENCOUNTER — Telehealth: Payer: Self-pay

## 2024-11-21 ENCOUNTER — Other Ambulatory Visit (HOSPITAL_COMMUNITY): Payer: Self-pay

## 2024-11-21 NOTE — Telephone Encounter (Signed)
 Pharmacy Patient Advocate Encounter  Insurance verification completed.   The patient is insured through [employer]   Ran test claim for [employer]. Currently a quantity of 84 is a 28 day supply and will need a PA . Epclusa will need a PA  This test claim was processed through [facility]- copay amounts may vary at other pharmacies due to pharmacy/plan contracts, or as the patient moves through the different stages of their insurance plan.

## 2024-11-21 NOTE — Telephone Encounter (Signed)
 Received voicemail from patient's wife, Sharlet (HAWAII), requesting call back to confirm that appointment is for hepatitis C. States when her husband was tested they recommended she get tested as well, but wanted to make sure she got tested for the correct virus.   Called Sharlet back, confirmed that appointment is for HCV referral. She will be unable to come with Connor Wilkerson to his appointment due to mobility difficulties and requests that clinical staff remind Connor Wilkerson to call her and have her on speakerphone during the appointment since she helps manage his medications and appointments. She also requested that provider send any written instructions home with Connor Wilkerson.   Eliceo Gladu, BSN, RN

## 2024-11-23 ENCOUNTER — Ambulatory Visit (INDEPENDENT_AMBULATORY_CARE_PROVIDER_SITE_OTHER): Admitting: Family

## 2024-11-23 ENCOUNTER — Other Ambulatory Visit (HOSPITAL_COMMUNITY): Payer: Self-pay

## 2024-11-23 ENCOUNTER — Other Ambulatory Visit: Payer: Self-pay

## 2024-11-23 ENCOUNTER — Encounter: Payer: Self-pay | Admitting: Family

## 2024-11-23 VITALS — BP 166/102 | HR 91 | Temp 97.0°F | Wt 149.0 lb

## 2024-11-23 DIAGNOSIS — B182 Chronic viral hepatitis C: Secondary | ICD-10-CM | POA: Diagnosis not present

## 2024-11-23 NOTE — Patient Instructions (Addendum)
 Nice to see you.  We will check your lab work today.  Once we have the results will let you know what medication we will treat with.  Next appointment will be one month after start of medication. Pharmacy team will call and schedule that appointment once they get the medication.  Have a great day and stay safe! j

## 2024-11-23 NOTE — Assessment & Plan Note (Addendum)
 Connor Wilkerson is an 84 y/o male with chronic Hepatitis C with initial Hepatitis C RNA level of 672,000 and risk factor of age. Treatment naive, asymptomatic and no family/personal history of liver disease. Discussed the basics of Hepatitis C including transmission, risk if left untreated, lab work, treatment options including side effects, available financial assistance, and plan of care.  Check Hepatitis C genotype, CBC, HIV, liver fibrosis, Hepatic function panel, and INR. Anticipate treatment with Mavyret or Epclusa with either medication being appropriate with dialysis but does have drug-drug interaction with atorvastatin  and may need to change to rosuvastatin  prior to initiation of treatment and will continue current dose of atorvastatin  for now. Not immune to Hepatitis B and would recommend vaccination. Plan for follow up in 1 month after start of medication or sooner if needed.

## 2024-11-23 NOTE — Progress Notes (Signed)
 "  Subjective:   Patient ID: Connor Wilkerson, male    DOB: November 28, 1940, 84 y.o.   MRN: [id number]  Chief Complaint  Patient presents with   New Patient (Initial Visit)   Hepatitis C    HPI:  Connor Wilkerson is a 84 y.o. male with end-stage renal disease on hemodialysis, hypertension, coronary artery disease,  ischemic cardiomyopathy, and peripheral vascular disease presenting today for evaluation and treatment of Hepatitis C at the request of his Nephrology Team.   Mr. Tenbrink lab work from 08/19/24 was received and reviewed showing a positive Hepatitis C antibody with Hepatitis C RNA level of 672,000. Hepatitis B surface antigen was negative and not immune to Hepatitis B with surface antibody of <10. His core total antibody was positive.  Ms. Hangartner joins the office visit via phone with his permission and provides some history. This was the first diagnosis of Hepatitis C that he was aware of. Risk factor is age (born between [birth year] and [birth year]) and denies history of injection drug use, recreational/illicit drug use, blood transfusions prior to 1992 or tattoos. Has not received treatment to date and current with fatigue and denies abdominal pain, nausea, vomiting, fatigue, fever, scleral icterus or jaundice. No personal or family history of liver disease. No current alcohol , tobacco, or recreational or illicit drug use.    Allergies[1]    Outpatient Medications Prior to Visit  Medication Sig Dispense Refill   acetaminophen  (TYLENOL ) 500 MG tablet Take 500-1,000 mg by mouth every 6 (six) hours as needed for moderate pain (pain score 4-6).     amLODipine  (NORVASC ) 2.5 MG tablet TAKE 2 TABLETS BY MOUTH DAILY (Patient taking differently: Take 2.5 mg by mouth daily.) 180 tablet 3   apixaban  (ELIQUIS ) 2.5 MG TABS tablet Take 1 tablet (2.5 mg total) by mouth 2 (two) times daily. 180 tablet 1   atorvastatin  (LIPITOR) 80 MG tablet Take 1 tablet (80 mg total) by mouth daily. 90 tablet 3   buPROPion   (WELLBUTRIN  SR) 150 MG 12 hr tablet Take 1 tablet (150 mg total) by mouth daily.     diclofenac  Sodium (VOLTAREN ) 1 % GEL Apply 4 g topically 4 (four) times daily.     escitalopram  (LEXAPRO ) 10 MG tablet Take 10 mg by mouth daily.     famotidine  (PEPCID ) 20 MG tablet Take 20 mg by mouth 2 (two) times daily.     gabapentin  (NEURONTIN ) 300 MG capsule Take 300 mg by mouth at bedtime.     lidocaine  (LIDODERM ) 5 % Place 1 patch onto the skin daily as needed (Pain).     nitroGLYCERIN  (NITROSTAT ) 0.4 MG SL tablet Place 1 tablet (0.4 mg total) under the tongue every 5 (five) minutes as needed for chest pain. 25 tablet 11   sacubitril -valsartan  (ENTRESTO ) 24-26 MG TAKE 1/2 TABLET TWICE A DAY BY MOUTH 180 tablet 2   sevelamer  carbonate (RENVELA ) 800 MG tablet Take 1 tablet (800 mg total) by mouth 3 (three) times daily with meals.     traZODone  (DESYREL ) 50 MG tablet Take 50 mg by mouth at bedtime as needed for sleep.     feeding supplement (ENSURE PLUS HIGH PROTEIN) LIQD Take 237 mLs by mouth 2 (two) times daily between meals. (Patient not taking: Reported on 11/23/2024)     mupirocin  ointment (BACTROBAN ) 2 % Apply 1 Application topically 2 (two) times daily. Head/Wrist (Patient not taking: Reported on 11/23/2024)     No facility-administered medications prior to visit.     Past  Medical History:  Diagnosis Date   Abnormality of gait 02/07/2015   Acute renal failure superimposed on chronic kidney disease, on chronic dialysis (HCC) 08/10/2024   Arthritis    shoulders, pinched nerve around waist and back   Asthma    CAD (coronary artery disease)    Hx of Inf MI treated with CABG in 1999 // Myoview 11/16: mild inf-lat ischemia, EF 34, high risk // LHC 3/19: 3/3 bypass grafts patent   Carotid artery disease    Followed by [facility] (Dr. Serene) // US  in 2/19: bilat ICA 40-59   Chronic systolic CHF (congestive heart failure) (HCC) 01/01/2010   Echo 12/30/2017: Mild LVH, EF 30-35, diffuse HK, grade 1 diastolic  dysfunction, mildly reduced RVSF // Echo 04/12/2016:  EF 35-40, PASP 39, mild to moderate TR, mild LAE // Echo (12/14):  Base/mid inferior and inferolateral HK, EF 45%, mild LAE, mildly reduced RVSF   CKD (chronic kidney disease) Stage 3 B    followed by dr peoples q year lov 04-23-2022 on chart   Depression    HLP (hyperkeratosis lenticularis perstans)    HTN (hypertension)    ICD (implantable cardiac defibrillator)  BSx    [employer]   Ischemic cardiomyopathy    Myocardial infarction (HCC) 1999   Near syncope 09/21/2014   Orthostatic hypotension    PAF (paroxysmal atrial fibrillation) (HCC)    Peripheral vascular disease    Wears glasses    Wears partial dentures    upper     Past Surgical History:  Procedure Laterality Date   AMPUTATION TOE Left 06/17/2023   Procedure: LEFT 2ND TOE AMPUTATION THRU PROXIMAL PHALANX;  Surgeon: Barton Drape, MD;  Location: [facility];  Service: Orthopedics;  Laterality: Left;   CARDIAC CATHETERIZATION N/A 12/11/2015   Procedure: Left Heart Cath and Coronary Angiography;  Surgeon: Rober Chroman, MD;  Location: [facility] INVASIVE CV LAB;  Service: Cardiovascular;  Laterality: N/A;   CARDIAC DEFIBRILLATOR PLACEMENT     [employer]   CARDIAC DEFIBRILLATOR PLACEMENT  04/08/2016   NOT MRI SAFE (DOCUMENT SCANNED IN SYSTEM   COLONOSCOPY  10/06/2007   Mild sigmoid diverticulosis. Small internal hemorrhoids.    CORONARY ARTERY BYPASS GRAFT     ELBOW SURGERY Left    plastic bone replacement   HERNIA REPAIR     x2   IR TUNNELED CENTRAL VENOUS CATH PLC W IMG  08/12/2024   LEFT HEART CATH AND CORS/GRAFTS ANGIOGRAPHY N/A 01/07/2018   Procedure: LEFT HEART CATH AND CORS/GRAFTS ANGIOGRAPHY;  Surgeon: Verlin Lonni BIRCH, MD;  Location: [facility] INVASIVE CV LAB;  Service: Cardiovascular;  Laterality: N/A;   PERMANENT PACEMAKER GENERATOR CHANGE N/A 11/25/2013   Procedure: PERMANENT PACEMAKER GENERATOR CHANGE;  Surgeon: Elspeth JAYSON Sage, MD;   Location: [facility] CATH LAB;  Service: Cardiovascular;  Laterality: N/A;    REVERSE SHOULDER ARTHROPLASTY Right 10/16/2023   Procedure: REVERSE SHOULDER ARTHROPLASTY;  Surgeon: Kay Kemps, MD;  Location: [facility];  Service: Orthopedics;  Laterality: Right;  100   ROTATOR CUFF REPAIR Right 2006   SINUS EXPLORATION  1987   ULNAR NERVE TRANSPOSITION Left 07/19/2019   Procedure: Ulnar nerve release at the elbow with anterior transposition and flexor pronator lengthening, anterior interosseous nerve transfer to the deep motor branch of the ulnar nerve at the forearm, flexor digitorum profundus tendon transfer of the ring and small to middle fingers, ulnar nerve release at the wrist, Guyons canal;  Surgeon: Camella Fallow, MD;  Location: [facility] OR;  Service: [place]  ULTRASOUND GUIDANCE FOR VASCULAR ACCESS  01/07/2018   Procedure: Ultrasound Guidance For Vascular Access;  Surgeon: Verlin Lonni BIRCH, MD;  Location: [facility] INVASIVE CV LAB;  Service: Cardiovascular;;       Review of Systems  Constitutional:  Negative for chills, diaphoresis, fatigue and fever.  Respiratory:  Negative for cough, chest tightness, shortness of breath and wheezing.   Cardiovascular:  Negative for chest pain.  Gastrointestinal:  Negative for abdominal distention, abdominal pain, constipation, diarrhea, nausea and vomiting.  Neurological:  Negative for weakness and headaches.  Hematological:  Does not bruise/bleed easily.    Objective:   BP (!) 166/102   Pulse 91   Temp (!) 97 F (36.1 C) (Oral)   Wt 149 lb (67.6 kg)   SpO2 95%   BMI 21.38 kg/m  Nursing note and vital signs reviewed.  Physical Exam Constitutional:      General: He is not in acute distress.    Appearance: He is well-developed.  Cardiovascular:     Rate and Rhythm: Normal rate and regular rhythm.     Heart sounds: Normal heart sounds. No murmur heard.    No friction rub. No gallop.  Pulmonary:     Effort: Pulmonary effort is normal. No  respiratory distress.     Breath sounds: Normal breath sounds. No wheezing or rales.  Chest:     Chest wall: No tenderness.  Abdominal:     General: Bowel sounds are normal. There is no distension.     Palpations: Abdomen is soft. There is no mass.     Tenderness: There is no abdominal tenderness. There is no guarding or rebound.  Skin:    General: Skin is warm and dry.  Neurological:     Mental Status: He is alert and oriented to person, place, and time.  Psychiatric:        Behavior: Behavior normal.        Thought Content: Thought content normal.        Judgment: Judgment normal.          No data to display           Assessment & Plan:    Patient Active Problem List   Diagnosis Date Noted   Chronic hepatitis C without hepatic coma (HCC) 11/23/2024   Anemia, chronic disease 08/14/2024   Protein-calorie malnutrition, severe 08/11/2024   Acute renal failure superimposed on chronic kidney disease, on chronic dialysis (HCC) 08/10/2024   Back pain 08/10/2024   Macrocytic anemia 08/10/2024   Thrombocytopenia 08/10/2024   PAD (peripheral artery disease) 08/10/2024   Uremic encephalopathy 08/10/2024   Cervical radiculopathy 06/02/2024   Lesion of ulnar nerve 06/02/2024   Osteoarthritis 06/02/2024   Shoulder pain 06/02/2024   Skin sensation disturbance 06/02/2024   S/P shoulder replacement, right 10/16/2023   Carotid artery disease 08/06/2022   PVC (premature ventricular contraction) 04/04/2022   Orthostatic hypotension 08/27/2020   Atrial fibrillation (HCC) 02/29/2020   Hypercoagulable state 02/29/2020   CAD (coronary artery disease) 12/07/2018   Hyperlipidemia 12/07/2018   CKD (chronic kidney disease) stage 3, GFR 30-59 ml/min (HCC) 12/07/2018   Ischemic cardiomyopathy    Essential hypertension 09/22/2016   Restless leg syndrome 07/14/2016   AKI (acute kidney injury) 04/11/2016   Hypotension 04/11/2016   Abnormality of gait 02/07/2015   Near syncope 09/21/2014    Repeated falls 12/15/2013   OSA (obstructive sleep apnea) 12/15/2013   Chronic systolic CHF (congestive heart failure) (HCC) 01/01/2010   CARDIOMYOPATHY, ISCHEMIC 12/31/2009  DYSPNEA 12/31/2009   Implantable cardioverter-defibrillator (ICD) in situ 12/31/2009     Problem List Items Addressed This Visit       Digestive   Chronic hepatitis C without hepatic coma (HCC) - Primary   Mr. Tuckerman is an 84 y/o male with chronic Hepatitis C with initial Hepatitis C RNA level of 672,000 and risk factor of age. Treatment naive, asymptomatic and no family/personal history of liver disease. Discussed the basics of Hepatitis C including transmission, risk if left untreated, lab work, treatment options including side effects, available financial assistance, and plan of care.  Check Hepatitis C genotype, CBC, HIV, liver fibrosis, Hepatic function panel, and INR. Anticipate treatment with Mavyret or Epclusa with either medication being appropriate with dialysis but does have drug-drug interaction with atorvastatin  and may need to change to rosuvastatin  prior to initiation of treatment and will continue current dose of atorvastatin  for now. Not immune to Hepatitis B and would recommend vaccination. Plan for follow up in 1 month after start of medication or sooner if needed.       Relevant Orders   Hepatic function panel   CBC   Hepatitis C genotype   HIV Antibody (routine testing w rflx)   Liver Fibrosis, FibroTest-ActiTest   Protime-INR     I am having Carlin PARAS. Wonda Carlin Girt maintain his escitalopram , famotidine , traZODone , gabapentin , nitroGLYCERIN , amLODipine , mupirocin  ointment, lidocaine , acetaminophen , diclofenac  Sodium, feeding supplement, sevelamer  carbonate, buPROPion , sacubitril -valsartan , apixaban , and atorvastatin .   Follow-up: 1 month after start of medication or sooner if needed.    Cathlyn July, MSN, FNP-C Nurse Practitioner [facility] Main number: [phone]      [1]  Allergies Allergen Reactions   Tramadol Other (See Comments)    Unknown Reaction -  Unsure of reaction    "

## 2024-11-28 ENCOUNTER — Ambulatory Visit: Admitting: Cardiovascular Disease

## 2024-11-29 ENCOUNTER — Ambulatory Visit: Payer: Self-pay | Admitting: Family

## 2024-11-29 DIAGNOSIS — B182 Chronic viral hepatitis C: Secondary | ICD-10-CM

## 2024-11-29 LAB — HEPATIC FUNCTION PANEL
AG Ratio: 1.6 (calc) (ref 1.0–2.5)
ALT: 9 U/L (ref 9–46)
AST: 18 U/L (ref 10–35)
Albumin: 3.9 g/dL (ref 3.6–5.1)
Alkaline phosphatase (APISO): 92 U/L (ref 35–144)
Bilirubin, Direct: 0.1 mg/dL (ref 0.0–0.2)
Globulin: 2.5 g/dL (ref 1.9–3.7)
Indirect Bilirubin: 0.2 mg/dL (ref 0.2–1.2)
Total Bilirubin: 0.3 mg/dL (ref 0.2–1.2)
Total Protein: 6.4 g/dL (ref 6.1–8.1)

## 2024-11-29 LAB — CBC
HCT: 40.5 % (ref 39.4–51.1)
Hemoglobin: 12.4 g/dL — ABNORMAL LOW (ref 13.2–17.1)
MCH: 29 pg (ref 27.0–33.0)
MCHC: 30.6 g/dL — ABNORMAL LOW (ref 31.6–35.4)
MCV: 94.8 fL (ref 81.4–101.7)
MPV: 11 fL (ref 7.5–12.5)
Platelets: 221 10*3/uL (ref 140–400)
RBC: 4.27 Million/uL (ref 4.20–5.80)
RDW: 15.2 % — ABNORMAL HIGH (ref 11.0–15.0)
WBC: 7.4 10*3/uL (ref 3.8–10.8)

## 2024-11-29 LAB — LIVER FIBROSIS, FIBROTEST-ACTITEST
ALT: 9 U/L (ref 9–46)
Alpha-2-Macroglobulin: 299 mg/dL — ABNORMAL HIGH (ref 106–279)
Apolipoprotein A1: 154 mg/dL (ref 94–176)
Bilirubin: 0.4 mg/dL (ref 0.2–1.2)
Fibrosis Score: 0.31
GGT: 10 U/L (ref 3–70)
Haptoglobin: 306 mg/dL — ABNORMAL HIGH (ref 43–212)
Necroinflammat ACT Score: 0.02
Reference ID: 5903946

## 2024-11-29 LAB — PROTIME-INR
INR: 1
Prothrombin Time: 10.9 s (ref 9.0–11.5)

## 2024-11-29 LAB — HIV ANTIBODY (ROUTINE TESTING W REFLEX)
HIV 1&2 Ab, 4th Generation: NONREACTIVE
HIV FINAL INTERPRETATION: NEGATIVE

## 2024-11-29 LAB — HEPATITIS C GENOTYPE: HCV Genotype: NOT DETECTED

## 2024-11-29 NOTE — Progress Notes (Signed)
 Lab results given to spouse Durelle Zepeda, she will inform patient.

## 2024-12-07 ENCOUNTER — Ambulatory Visit: Admitting: Cardiovascular Disease

## 2024-12-07 ENCOUNTER — Telehealth: Payer: Self-pay

## 2024-12-07 NOTE — Telephone Encounter (Signed)
 Attempted additional phone number, phone rings and rings but no answer or voicemail.

## 2024-12-07 NOTE — Telephone Encounter (Signed)
 Unable to leave message for pt to call back, voicemail is full.

## 2024-12-07 NOTE — Telephone Encounter (Signed)
 Scheduling team was able to get in touch with pt. Pt's appointment cancelled for today. Pt is established with [facility] therefore does not need to see Dr. Court for [facility] consult.

## 2024-12-29 ENCOUNTER — Ambulatory Visit: Payer: Self-pay | Admitting: Family

## 2025-01-02 ENCOUNTER — Ambulatory Visit: Admitting: Surgery

## 2025-01-02 ENCOUNTER — Ambulatory Visit (HOSPITAL_COMMUNITY)

## 2025-01-02 ENCOUNTER — Encounter

## 2025-01-10 ENCOUNTER — Ambulatory Visit: Admitting: Pulmonary Disease

## 2025-04-03 ENCOUNTER — Encounter

## 2025-07-03 ENCOUNTER — Encounter
